# Patient Record
Sex: Female | Born: 1940
Health system: Southern US, Community
[De-identification: ages and names within clinical notes are randomized; demographics above are authoritative.]

## PROBLEM LIST (undated history)

## (undated) DIAGNOSIS — K219 Gastro-esophageal reflux disease without esophagitis: Secondary | ICD-10-CM

## (undated) DIAGNOSIS — B20 Human immunodeficiency virus [HIV] disease: Secondary | ICD-10-CM

## (undated) DIAGNOSIS — M19049 Primary osteoarthritis, unspecified hand: Secondary | ICD-10-CM

## (undated) DIAGNOSIS — C801 Malignant (primary) neoplasm, unspecified: Secondary | ICD-10-CM

## (undated) DIAGNOSIS — D649 Anemia, unspecified: Secondary | ICD-10-CM

## (undated) DIAGNOSIS — K759 Inflammatory liver disease, unspecified: Secondary | ICD-10-CM

## (undated) DIAGNOSIS — I728 Aneurysm of other specified arteries: Secondary | ICD-10-CM

## (undated) DIAGNOSIS — IMO0001 Reserved for inherently not codable concepts without codable children: Secondary | ICD-10-CM

## (undated) DIAGNOSIS — M479 Spondylosis, unspecified: Secondary | ICD-10-CM

## (undated) DIAGNOSIS — K7460 Unspecified cirrhosis of liver: Secondary | ICD-10-CM

## (undated) DIAGNOSIS — Z8669 Personal history of other diseases of the nervous system and sense organs: Secondary | ICD-10-CM

## (undated) DIAGNOSIS — I10 Essential (primary) hypertension: Secondary | ICD-10-CM

## (undated) DIAGNOSIS — T8859XA Other complications of anesthesia, initial encounter: Secondary | ICD-10-CM

## (undated) DIAGNOSIS — C50919 Malignant neoplasm of unspecified site of unspecified female breast: Secondary | ICD-10-CM

## (undated) DIAGNOSIS — E039 Hypothyroidism, unspecified: Secondary | ICD-10-CM

## (undated) DIAGNOSIS — E785 Hyperlipidemia, unspecified: Secondary | ICD-10-CM

## (undated) HISTORY — DX: Unspecified cirrhosis of liver: K74.60

## (undated) HISTORY — PX: BREAST LUMPECTOMY: SHX2

## (undated) HISTORY — DX: Malignant (primary) neoplasm, unspecified: C80.1

## (undated) HISTORY — PX: APPENDECTOMY: SHX54

## (undated) HISTORY — PX: TONSILLECTOMY: SUR1361

## (undated) HISTORY — DX: Hyperlipidemia, unspecified: E78.5

## (undated) HISTORY — DX: Aneurysm of other specified arteries: I72.8

## (undated) HISTORY — DX: Gastro-esophageal reflux disease without esophagitis: K21.9

## (undated) HISTORY — DX: Anemia, unspecified: D64.9

---

## 1973-03-10 HISTORY — PX: TUBAL LIGATION: SHX77

## 2003-12-13 ENCOUNTER — Encounter: Admission: RE | Admit: 2003-12-13 | Discharge: 2003-12-13 | Payer: Self-pay | Admitting: Family Medicine

## 2004-06-19 ENCOUNTER — Ambulatory Visit (HOSPITAL_COMMUNITY): Admission: RE | Admit: 2004-06-19 | Discharge: 2004-06-19 | Payer: Self-pay | Admitting: Family Medicine

## 2004-06-19 IMAGING — CR DG CHEST 2V
2 series · 2 of 2 positions shown · non-contrast
Comparison: No comparison films available.

CLINICAL DATA: Cough, chest pain. 
 2 VIEW CHEST:

[view not recorded (1 of 2)]
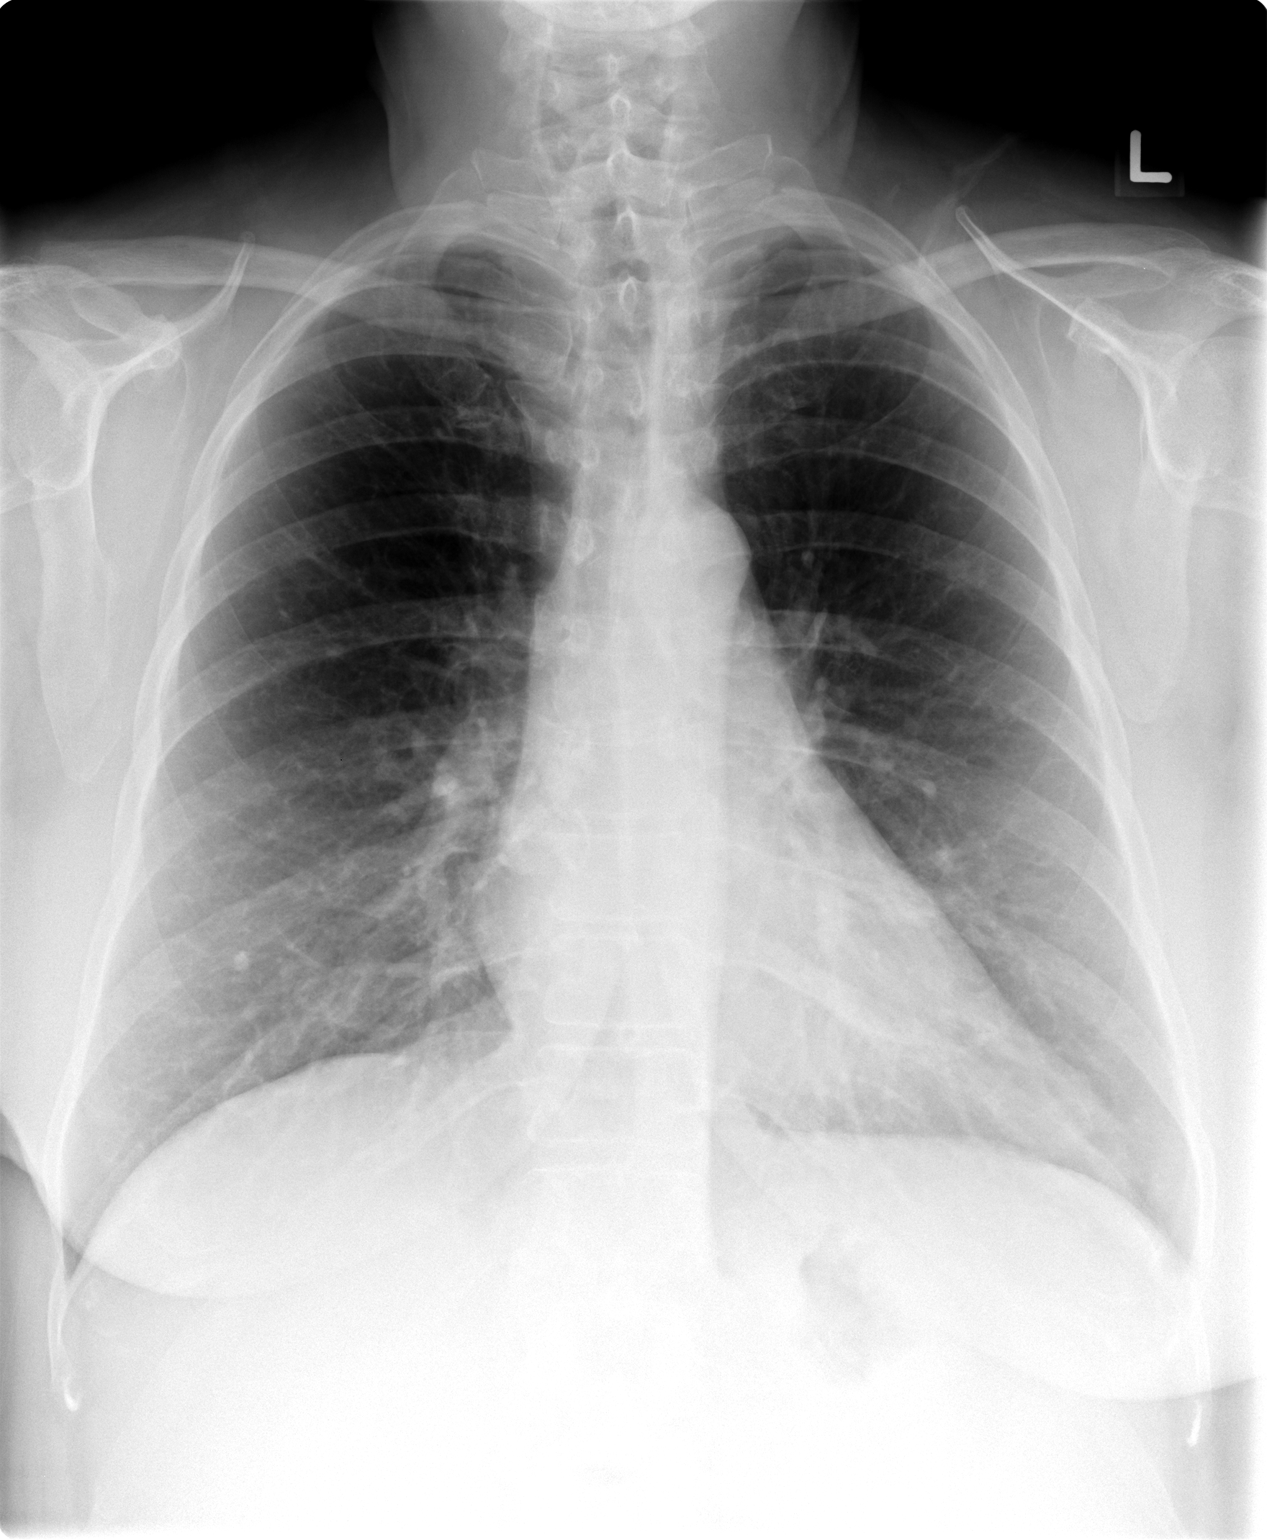

[view not recorded (2 of 2)]
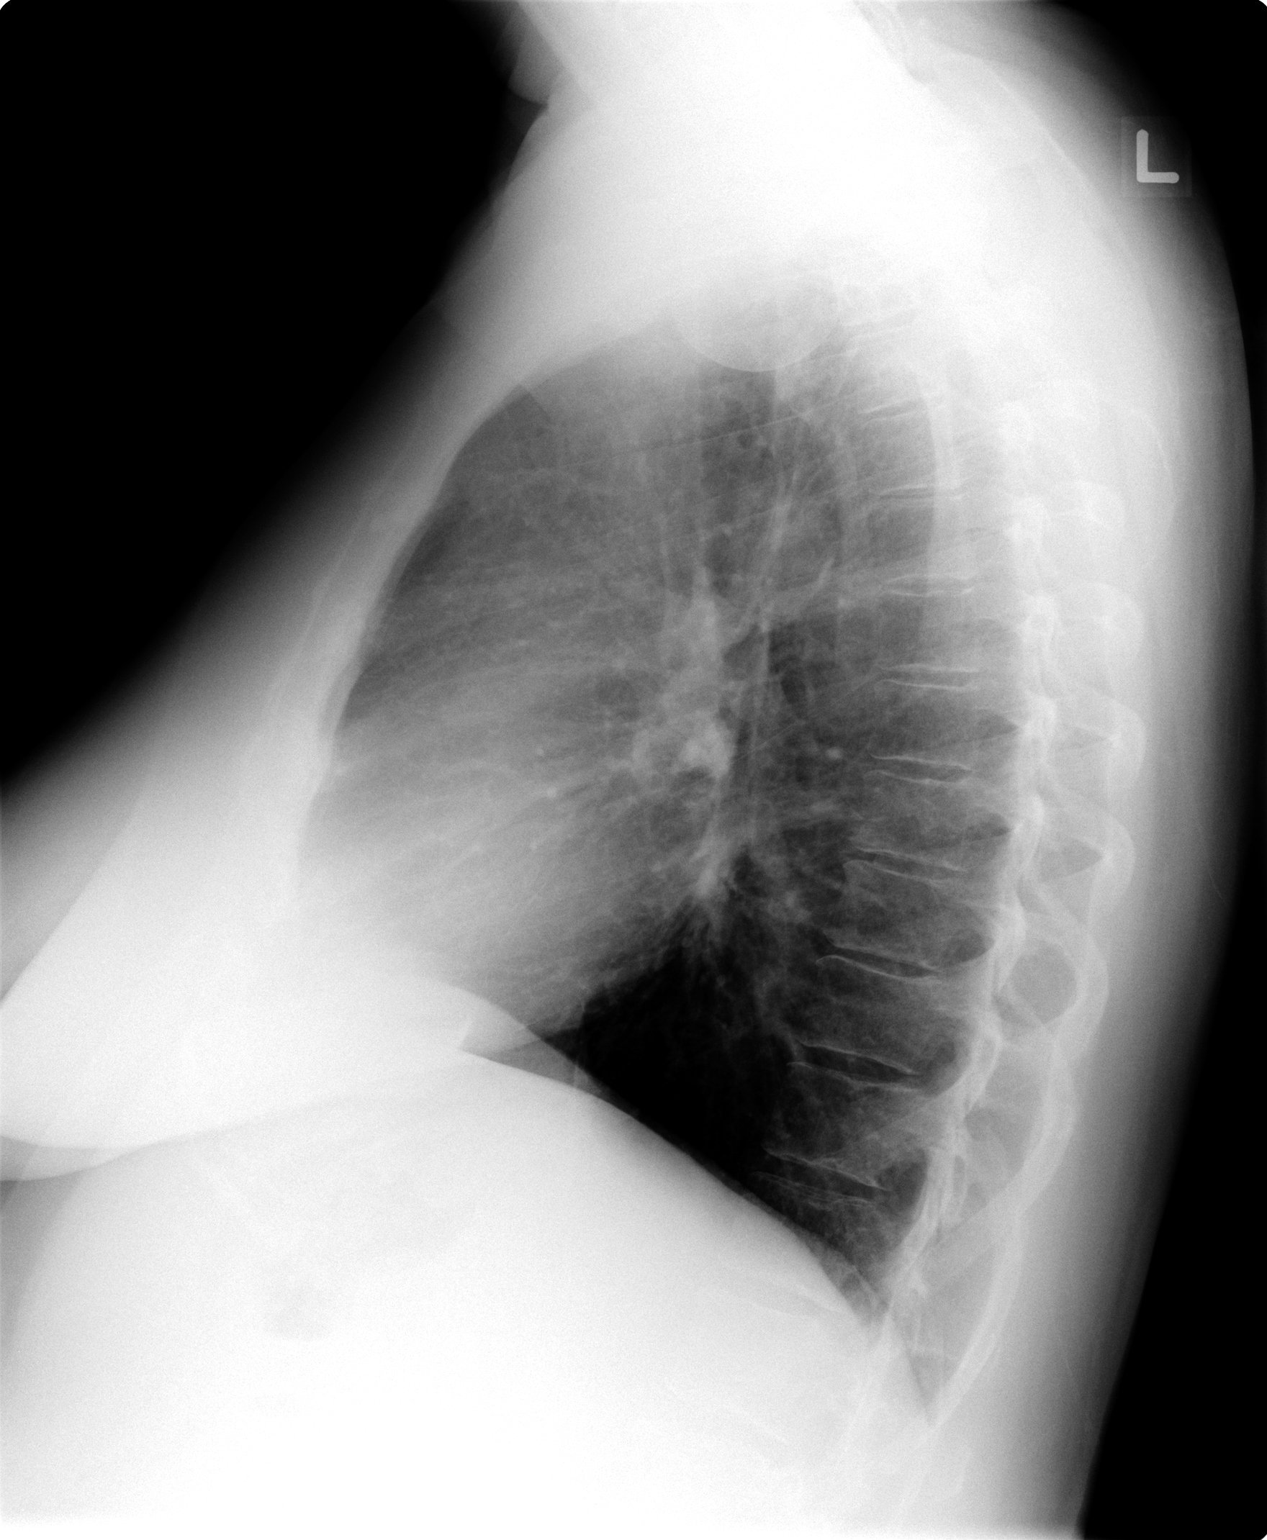

[2 of 2 positions shown; findings below may reference images not displayed]

Mild peribronchial thickening noted.  Cardiomediastinal silhouette is unremarkable.  No focal airspace disease.  Calcified mediastinal / hilar lymph nodes and pulmonary granulomas are noted.  There are a few noncalcified nodules in the mid / upper lungs.  No pleural effusions or pneumothorax.
IMPRESSION: 1. A few small scattered nodules in the mid / upper lungs left greater than right, likely related to prior granulomatous disease but if no old films are available recommend further evaluation with CT. 
 2. Peribronchial thickening and evidence of previous granulomatous disease.

## 2004-06-25 ENCOUNTER — Encounter: Admission: RE | Admit: 2004-06-25 | Discharge: 2004-06-25 | Payer: Self-pay | Admitting: Family Medicine

## 2004-06-25 IMAGING — CT CT CHEST W/ CM
1 series · 15 of 30 positions shown, 19 images · IV contrast (75ML OMNI 300)
Comparison: none

CLINICAL DATA: [REDACTED] chest x-ray [DATE] demonstrates small midupper lung nodules for further assessment, current back pain.  Cough for ten days.  Five pound weight loss.  Fever.  Prior breast cancer.  Right lumpectomy.
CT CHEST WITH CONTRAST:
TECHNIQUE: Multidetector spiral axial images were obtained through the thorax with IV injection 75 cc Omnipaque 300 and comparison made with [REDACTED] chest x-ray [DATE].

[Series 2: chest w/ · axial · 0.62mm/px · z∈[-369,-54]mm · 15 of 69 slices shown, 19 images]
[im 3/69  mediastinal]
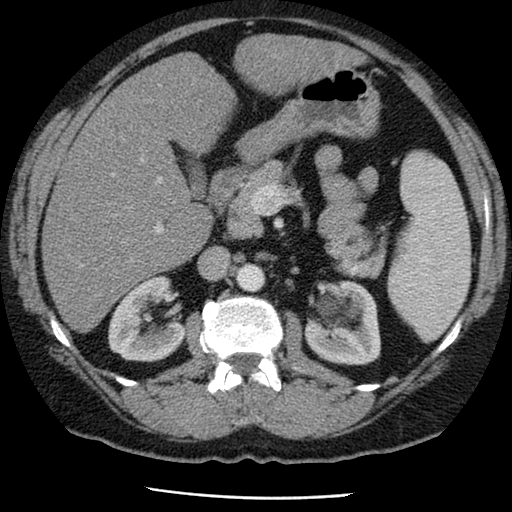
[im 3/69  lung]
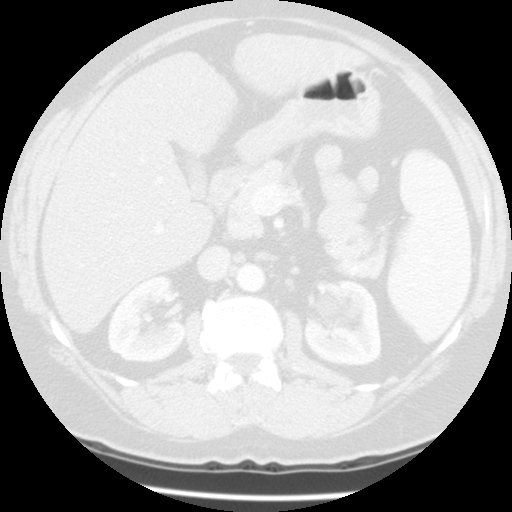
[im 8/69  lung]
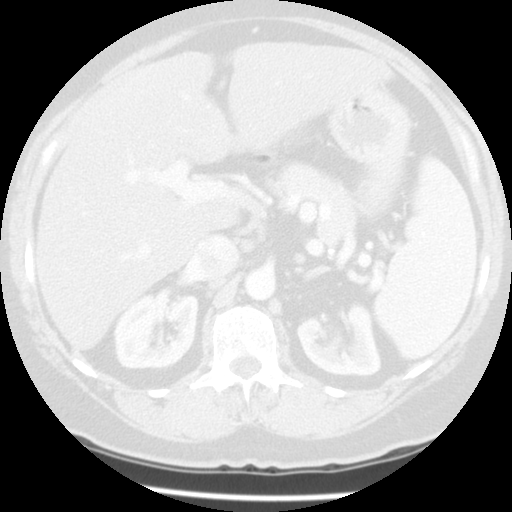
[im 13/69  lung]
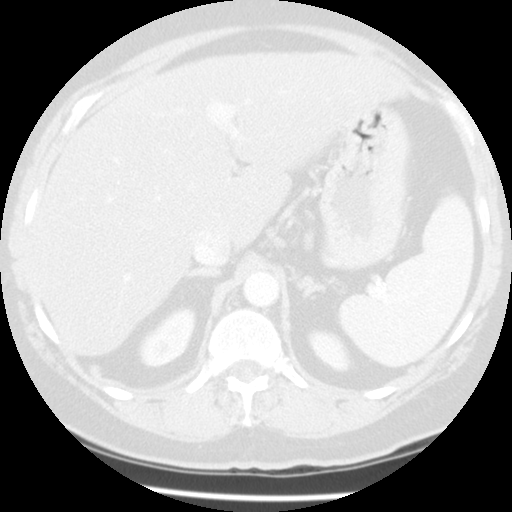
[im 16/69  lung]
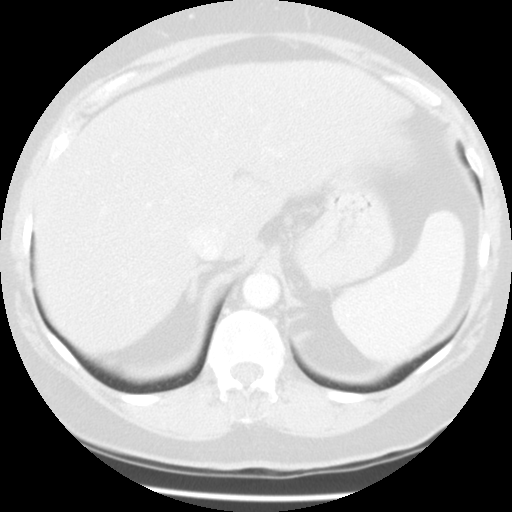
[im 21/69  mediastinal]
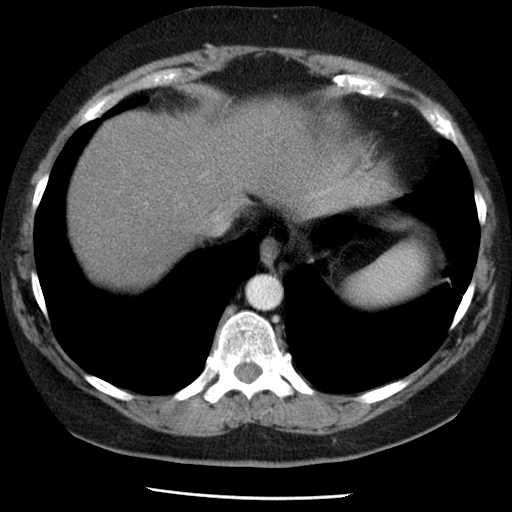
[im 21/69  lung]
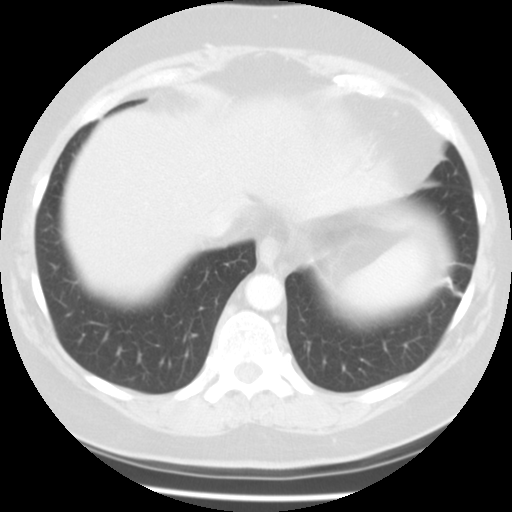
[im 26/69  lung]
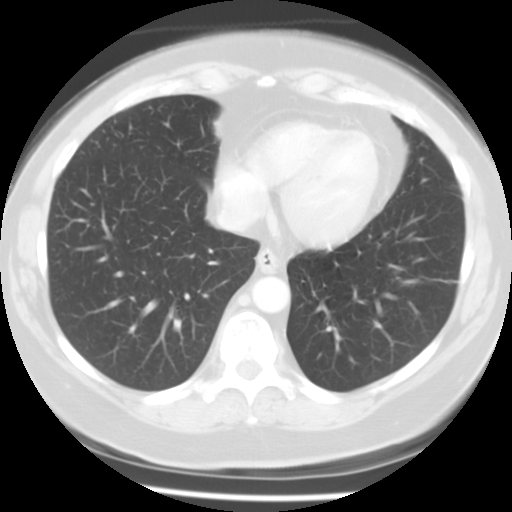
[im 31/69  lung]
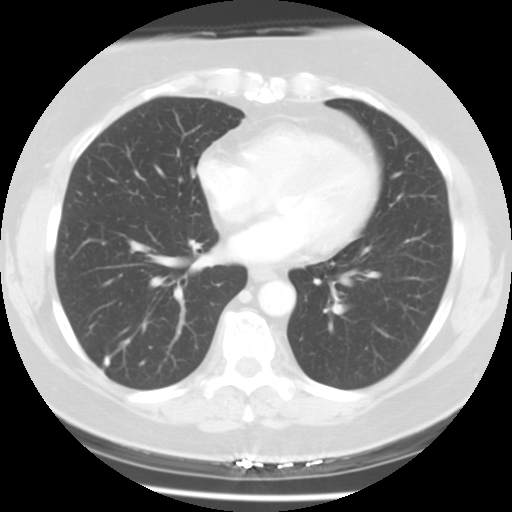
[im 36/69  lung]
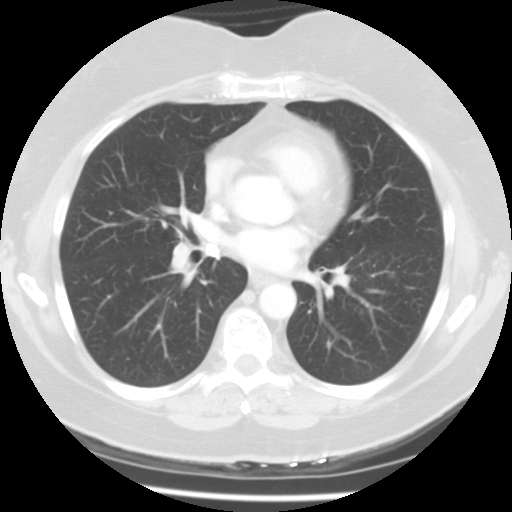
[im 38/69  mediastinal]
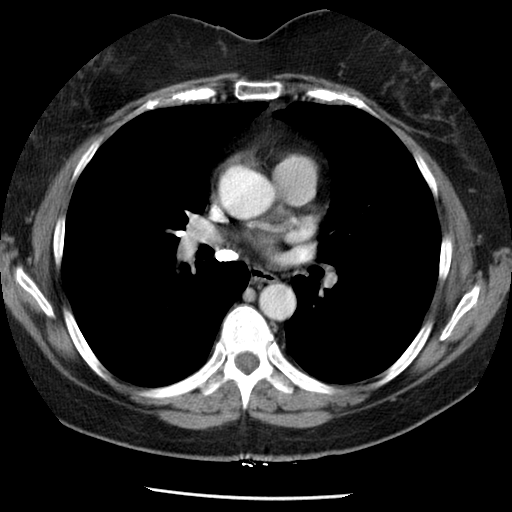
[im 38/69  lung]
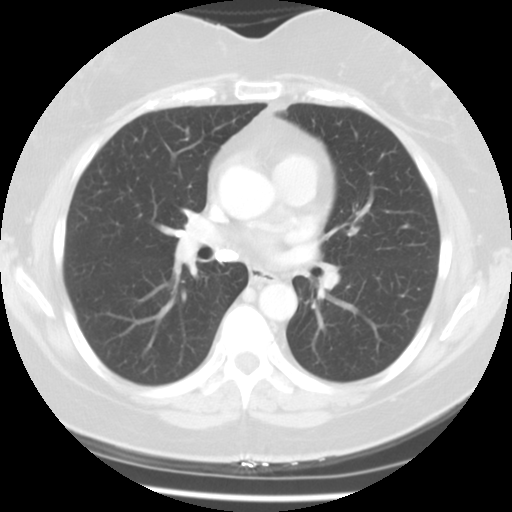
[im 43/69  lung]
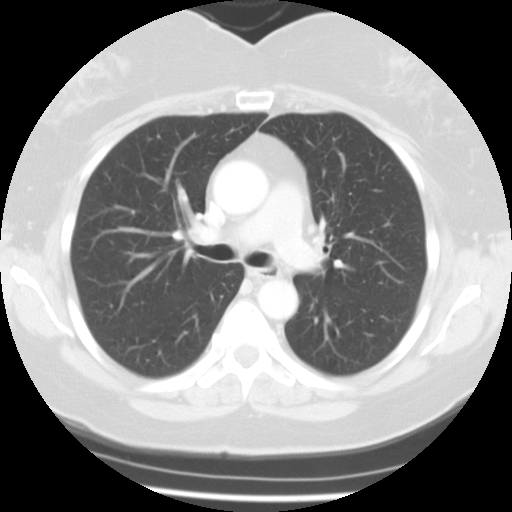
[im 48/69  lung]
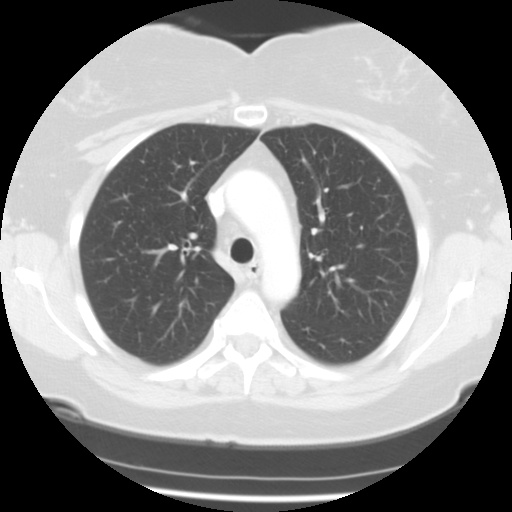
[im 53/69  lung]
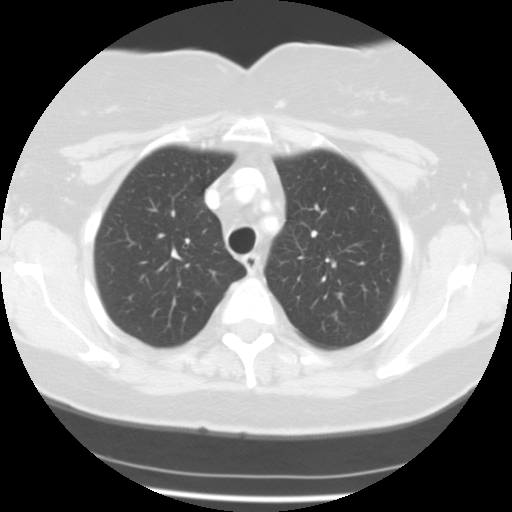
[im 56/69  mediastinal]
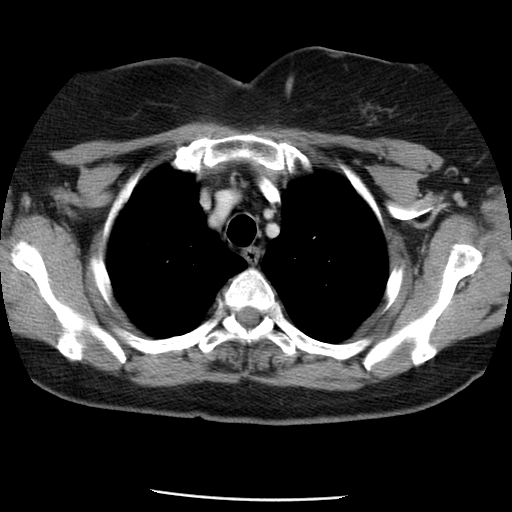
[im 56/69  lung]
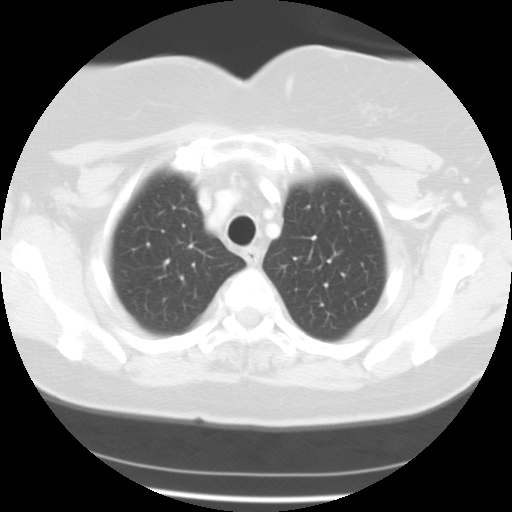
[im 61/69  lung]
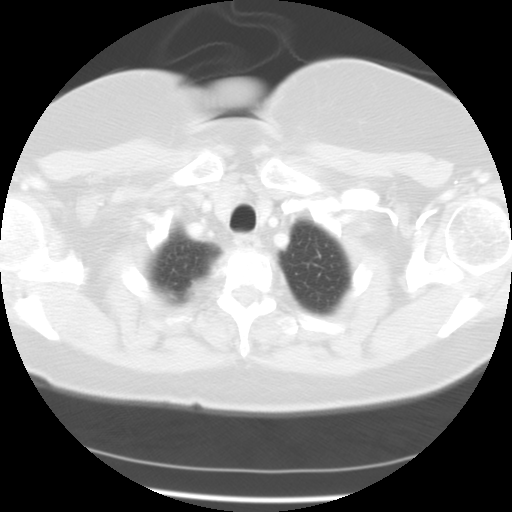
[im 66/69  lung]
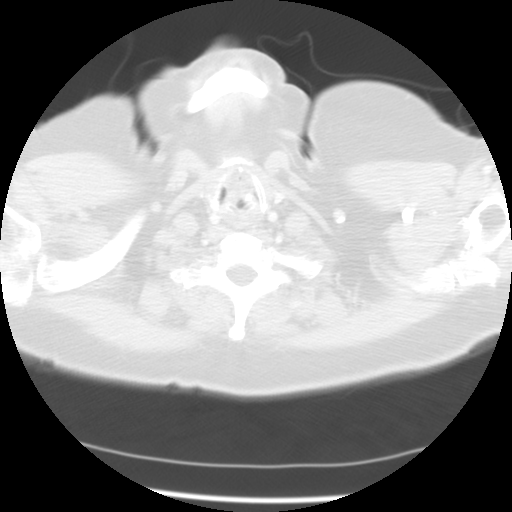

[15 of 30 positions shown; findings below may reference images not displayed]

FINDINGS: In the opacities at the upper lobe on prior CXR, no CT abnormality is seen in this region consistent with previous probable vascular and rib summation shadows.  Old granulomatous calcified right mediastinal and hilar lymph nodes are seen with 6 mm calcified granuloma at the right lung base (image 40).  Slight probable bronchitic changes are seen with slight probable inflammatory fibronodular changes at the superior segment left lower lobe (image 36).  The lungs are otherwise clear.  No mediastinal, hilar, nor axillary mass/adenopathy is seen with no liver, adrenal, nor osseous metastatic disease seen.  Incidental multiple small calcified splenic granulomata and slight diffuse fatty infiltration of the liver is seen.  Probable small 2 cm left renal parapelvic renal cyst is seen.
IMPRESSION: Since [REDACTED] chest x-ray [DATE]:
1.  Upper lobe opacities are ascribed to rib and vascular summation shadows with no pulmonary nodules seen in this region.
2.  Old granulomatous disease. 
3.  Slight diffuse fatty infiltration of the liver and 2 cm parapelvic left renal cyst.
4.  Otherwise no significant abnormality.

## 2005-01-09 ENCOUNTER — Encounter: Admission: RE | Admit: 2005-01-09 | Discharge: 2005-01-09 | Payer: Self-pay | Admitting: Family Medicine

## 2005-11-03 ENCOUNTER — Other Ambulatory Visit: Admission: RE | Admit: 2005-11-03 | Discharge: 2005-11-03 | Payer: Self-pay | Admitting: Family Medicine

## 2007-06-29 ENCOUNTER — Encounter: Admission: RE | Admit: 2007-06-29 | Discharge: 2007-06-29 | Payer: Self-pay | Admitting: Family Medicine

## 2007-06-29 IMAGING — MG MM DIAGNOSTIC BILATERAL
4 series · 4 of 4 positions shown · non-contrast
Comparison: none

DG DIAGNOSTIC BILATERAL
Bilateral CC and MLO view(s) were taken.

DIGITAL BILATERAL DIAGNOSTIC MAMMOGRAM WITH CAD:
CLINICAL DATA: Patient underwent lumpectomy and radiation therapy for right breast cancer in  [OQ].

[R CC]
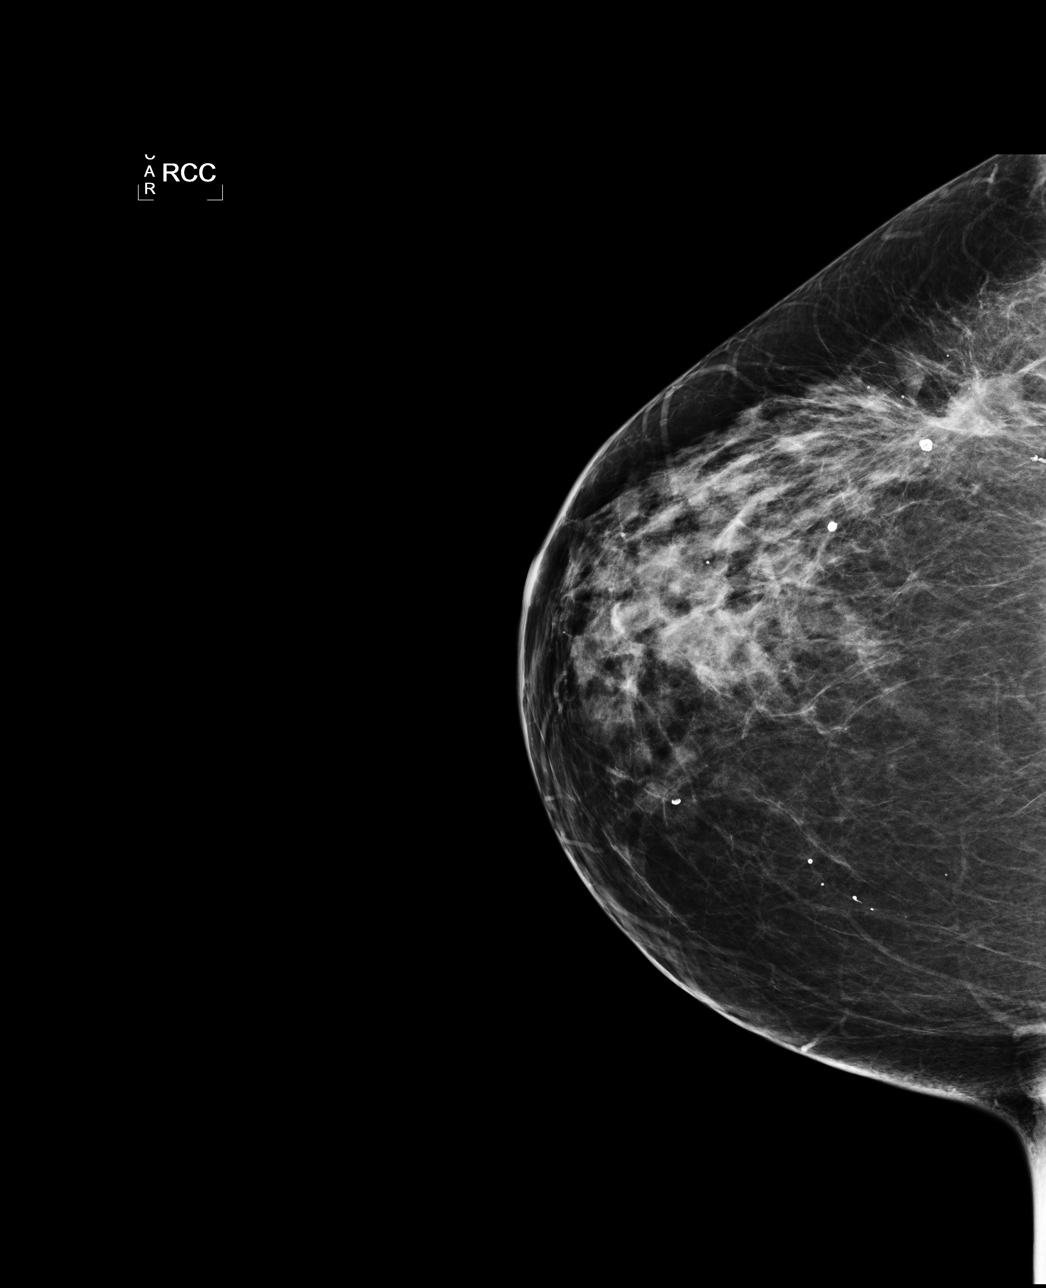

[L CC]
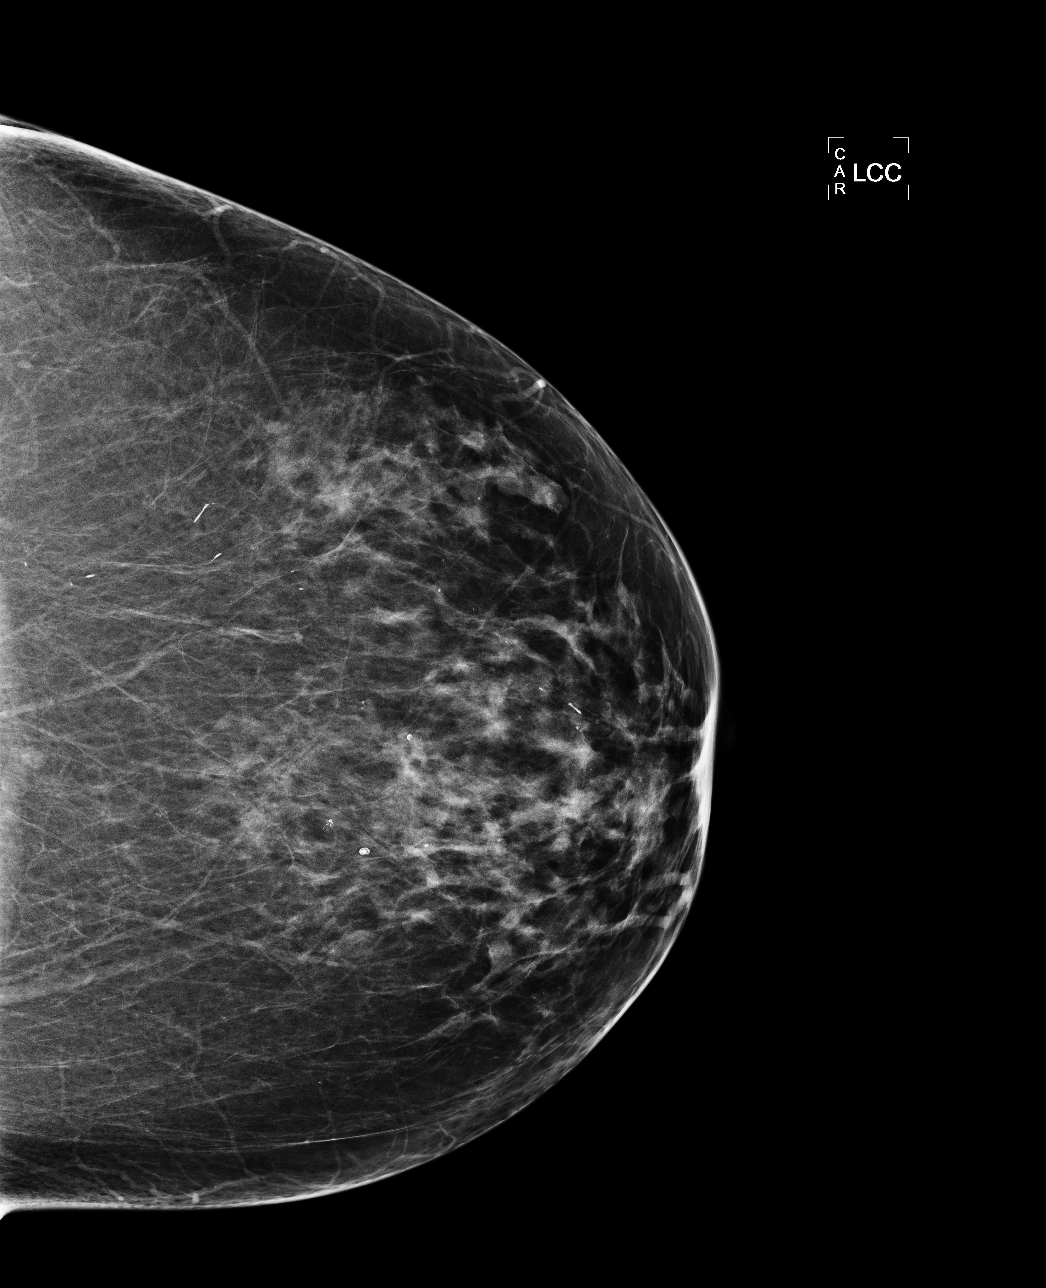

[L MLO]
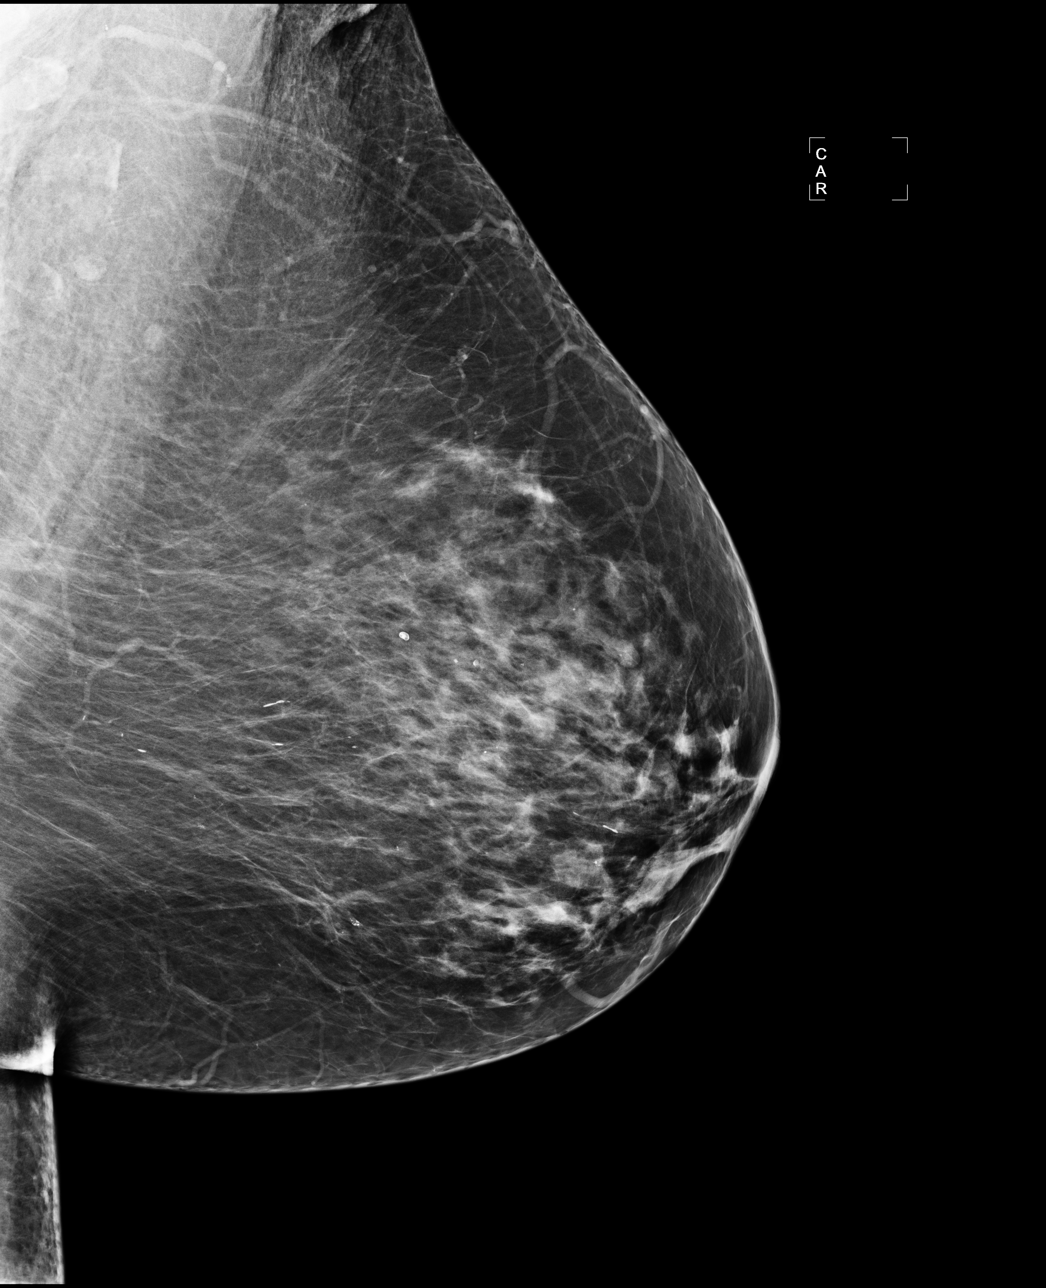

[R MLO]
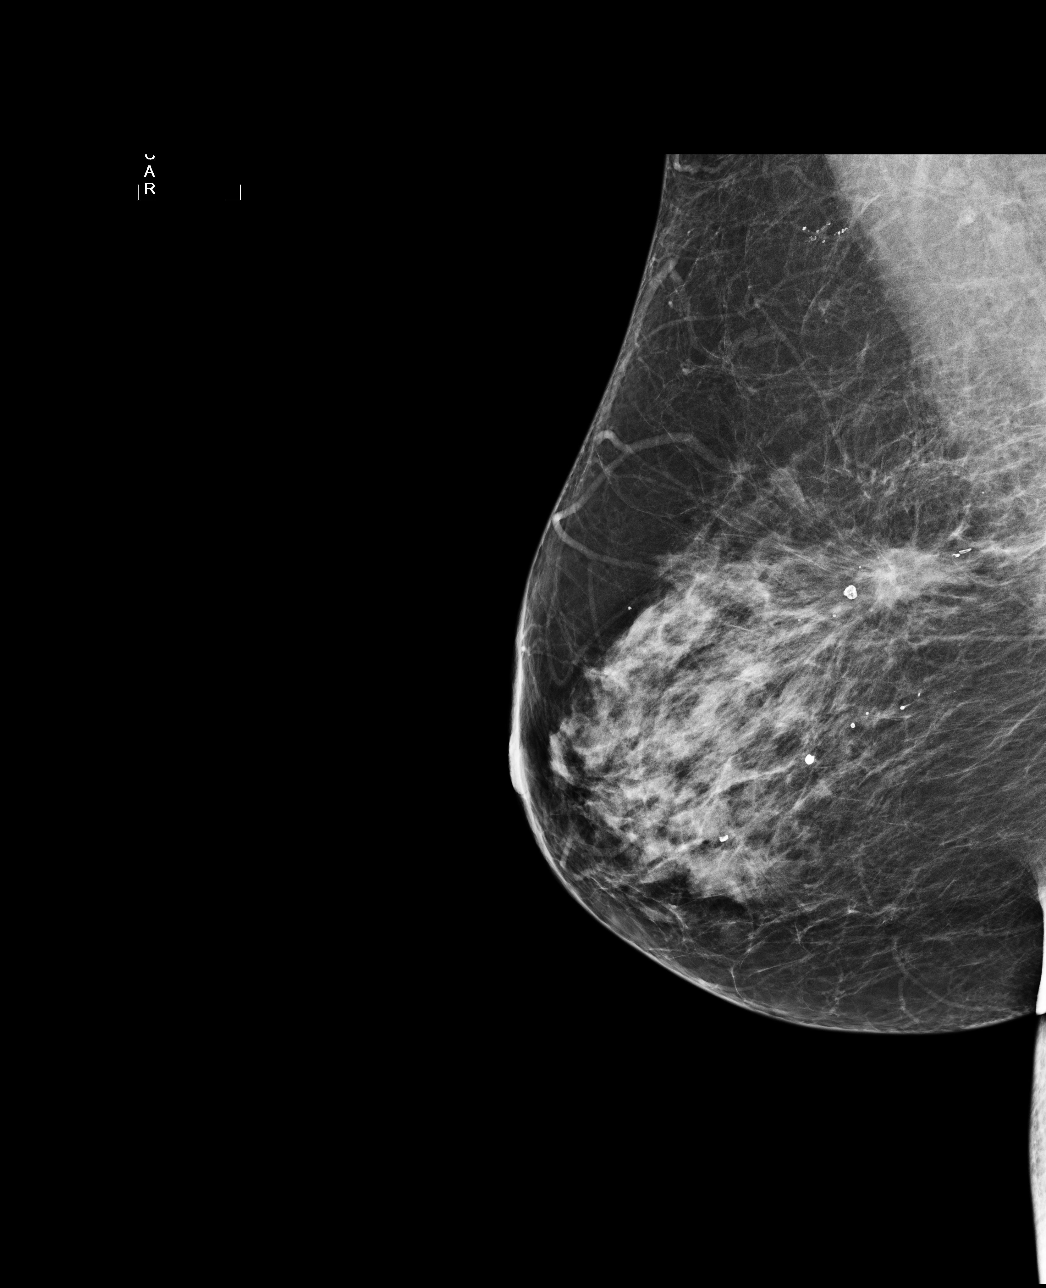

[4 of 4 positions shown; findings below may reference images not displayed]

Comparison is made to prior studies dated [DATE] and [DATE].  There are scattered fibroglandular 
densities.  Lumpectomy changes are noted on the right.  There are scattered calcifications that 
have a benign appearance.  No mass, non-surgical architectural distortion or calcification to 
suggest malignancy is identified.
IMPRESSION: No mammographic evidence of malignancy.  Yearly screening mammography is now suggested.

ASSESSMENT: Benign - BI-RADS 2

Screening mammogram of both breasts in 1 year.
ANALYZED BY COMPUTER AIDED DETECTION. , THIS PROCEDURE WAS A DIGITAL MAMMOGRAM

## 2008-05-17 ENCOUNTER — Encounter: Admission: RE | Admit: 2008-05-17 | Discharge: 2008-05-17 | Payer: Self-pay | Admitting: Family Medicine

## 2008-05-17 IMAGING — CT CT ABDOMEN W/ CM
2 of 5 series · 17 of 46 positions shown, 19 images · IV contrast (CONTRAST)
Comparison: None

CT ABDOMEN

CLINICAL DATA: Questioned aneurysm of the splenic artery, history
right breast carcinoma with left back [REDACTED] and radiation, history
diabetes

CT ABDOMEN AND PELVIS WITH CONTRAST
TECHNIQUE: Multidetector CT imaging of the abdomen and pelvis was
performed using the standard protocol following bolus
administration of intravenous contrast.
Contrast: 100 ml Optiray 350

[Series 2: portal · axial · portal-venous · 0.73mm/px · z∈[+628,+1012]mm · 14 of 87 slices shown, 16 images]
[im 5/87  soft-tissue]
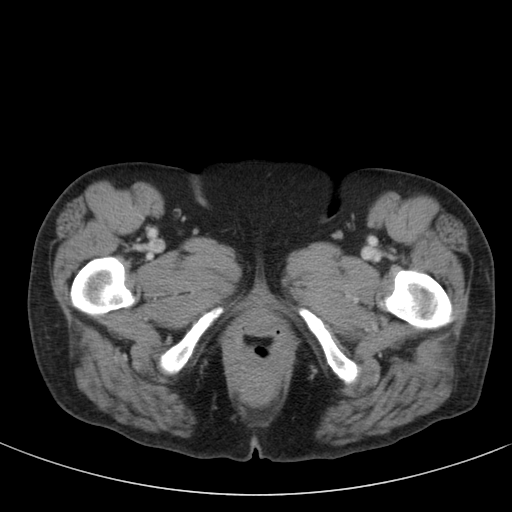
[im 5/87  bone]
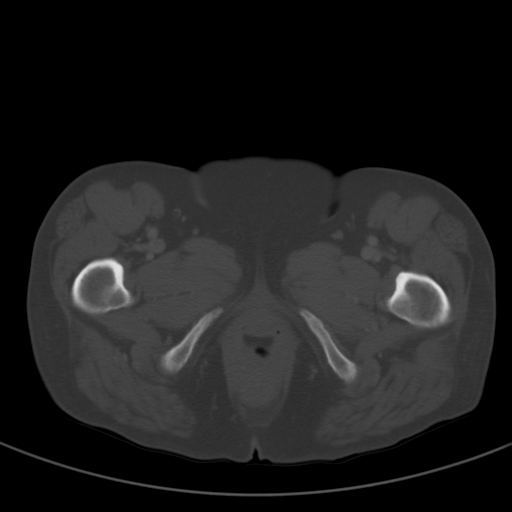
[im 10/87  soft-tissue]
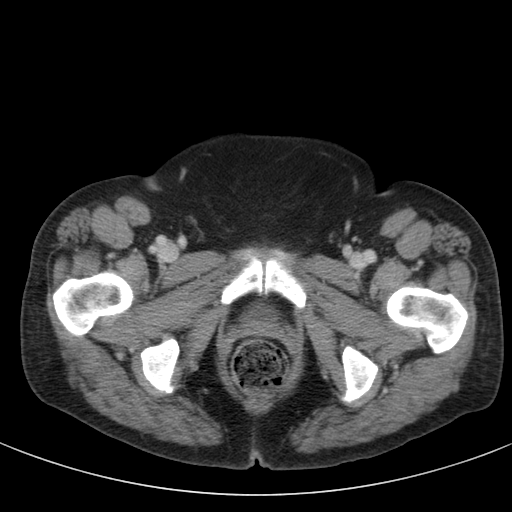
[im 20/87  soft-tissue]
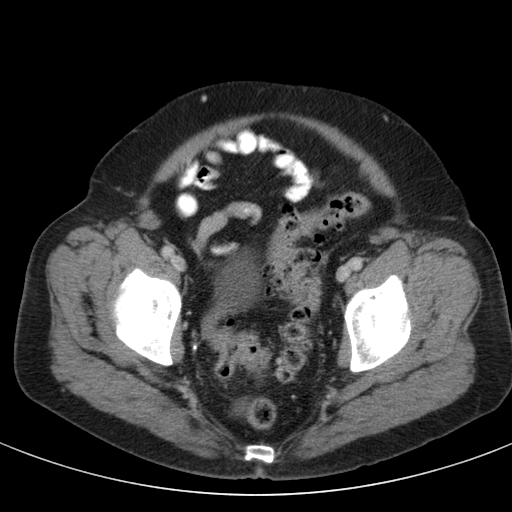
[im 24/87  soft-tissue]
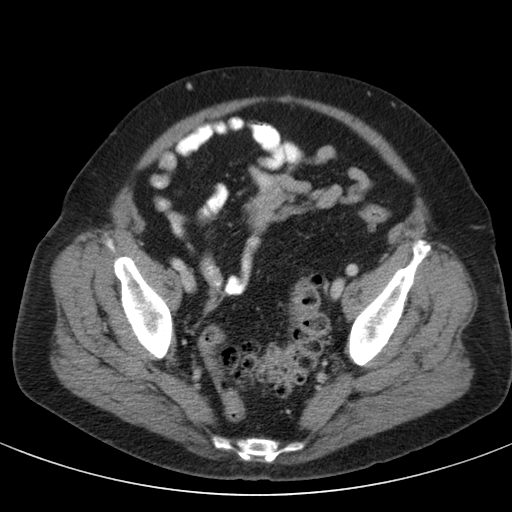
[im 29/87  soft-tissue]
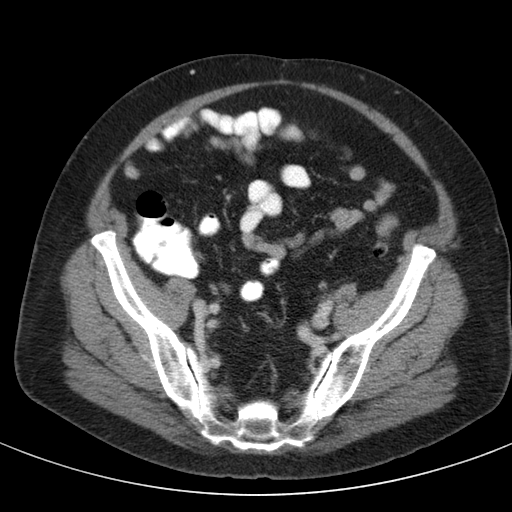
[im 34/87  soft-tissue]
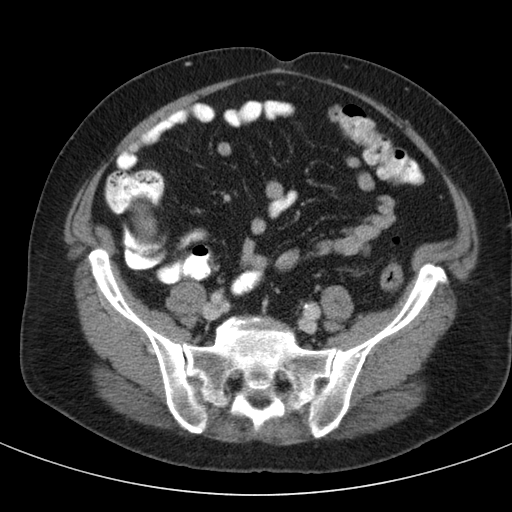
[im 39/87  soft-tissue]
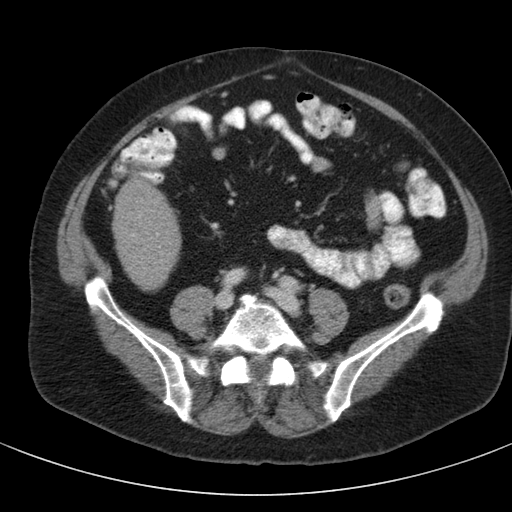
[im 48/87  soft-tissue]
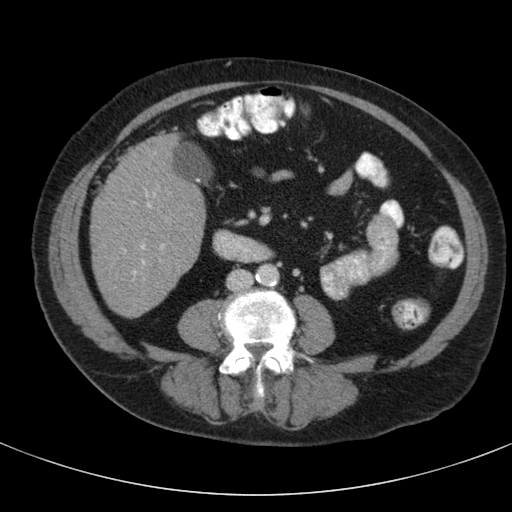
[im 53/87  soft-tissue]
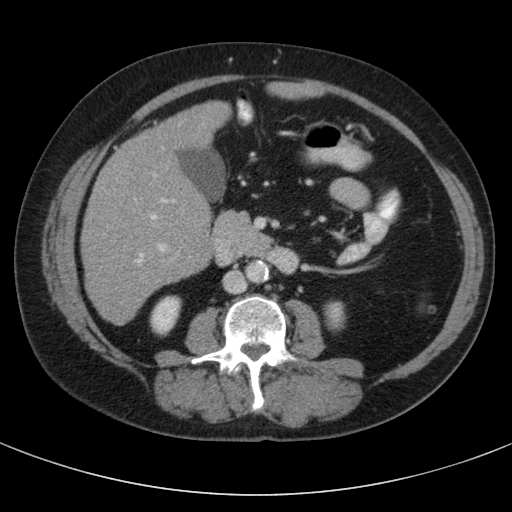
[im 53/87  bone]
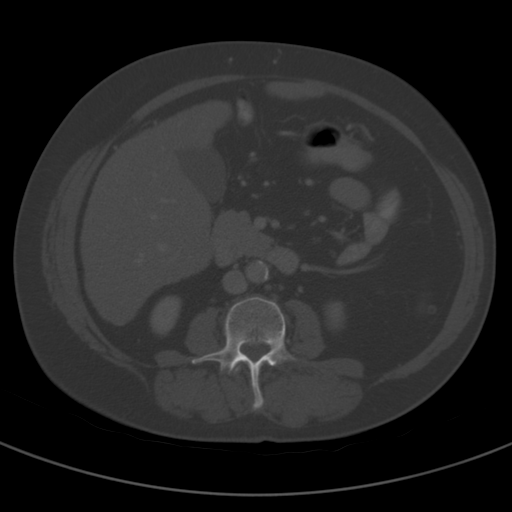
[im 58/87  soft-tissue]
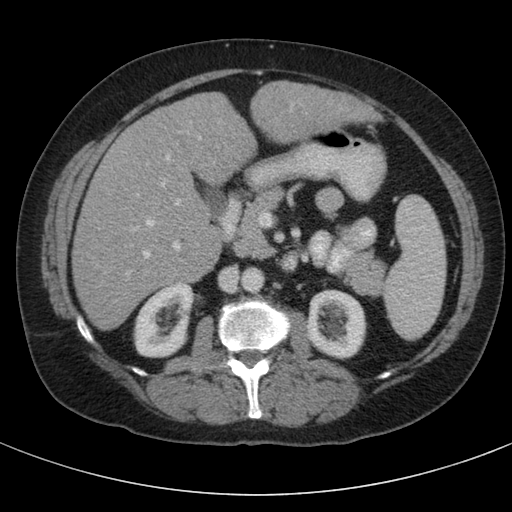
[im 63/87  soft-tissue]
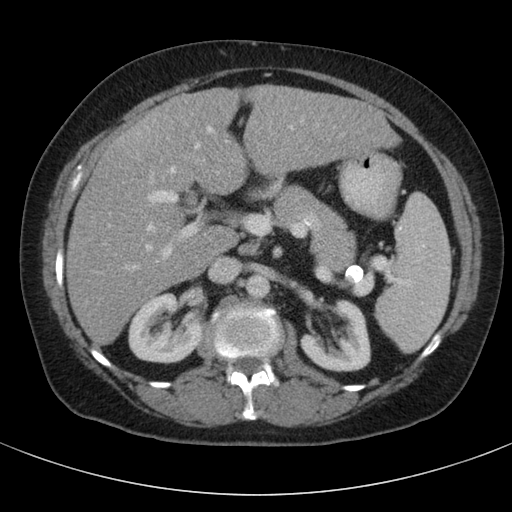
[im 67/87  soft-tissue]
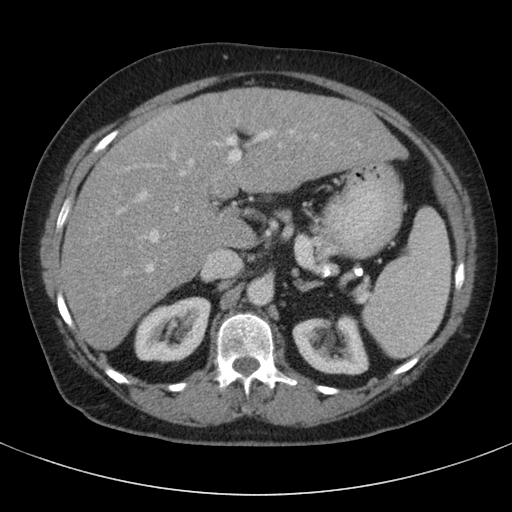
[im 77/87  soft-tissue]
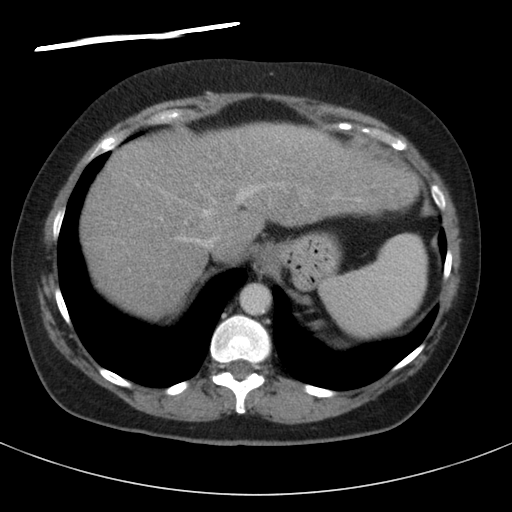
[im 82/87  soft-tissue]
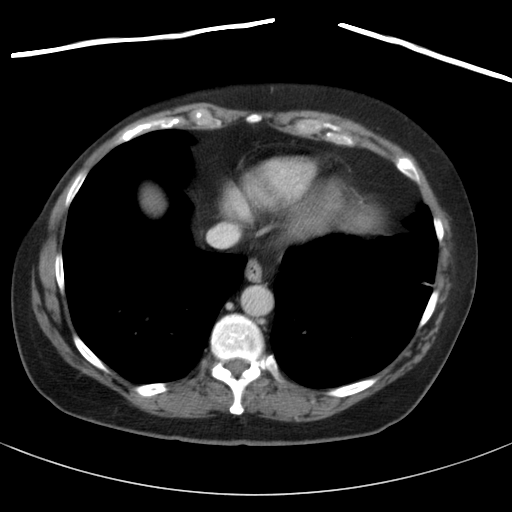

[coronals · coronal · 0.84mm/px · 3 of 121 slices shown]
[im 41/121  soft-tissue]
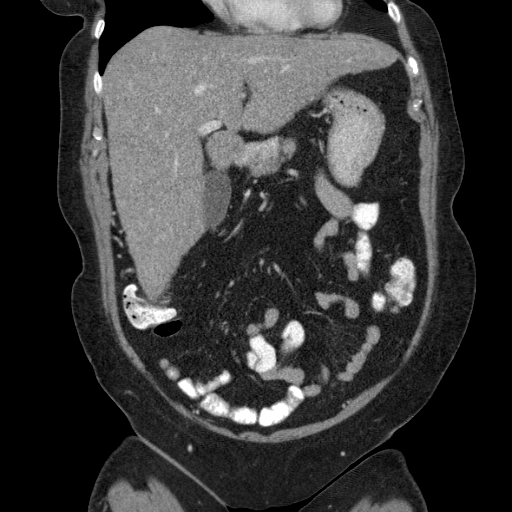
[im 54/121  soft-tissue]
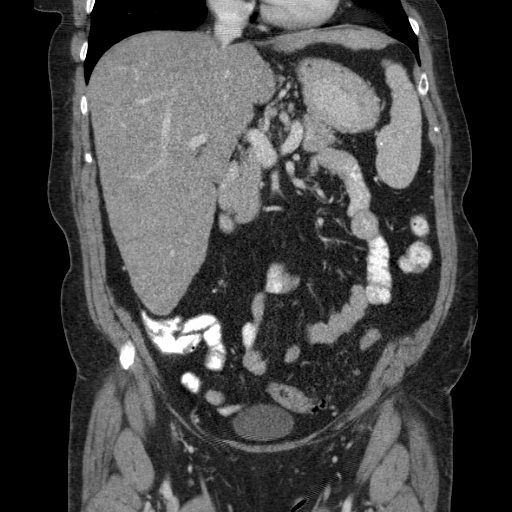
[im 67/121  soft-tissue]
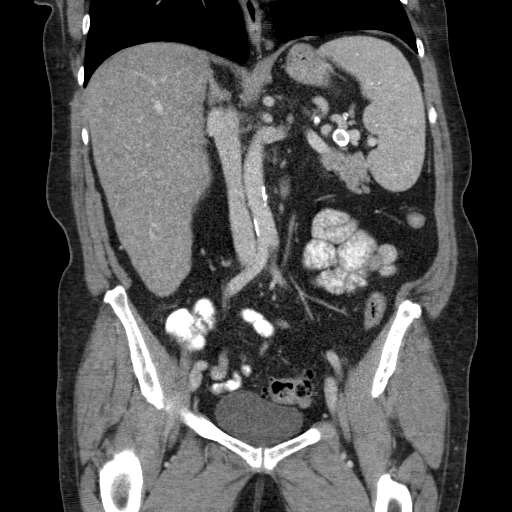

[17 of 46 positions shown; findings below may reference images not displayed]

FINDINGS: A calcified granuloma is noted in the right lower lobe.
Otherwise the lung bases are clear.  Enhancement of the liver is
somewhat inhomogeneous of the liver is rather low in attenuation
suggesting fatty infiltration.  Small partially calcified gallstone
layers dependently within the gallbladder.  The pancreas is normal
in size and the pancreatic duct is not dilated.  The adrenal glands
and spleen appear normal with a few small calcified splenic
granulomas noted.  There is some calcification of the splenic
artery.  There is a heavily calcified small splenic artery aneurysm
of 13 mm in diameter.  The kidneys enhance and on delayed images
the pelvocaliceal systems appear normal.  Mild atheromatous changes
noted in the abdominal aorta.  No adenopathy is seen.
IMPRESSION: 1.  Densely calcified small splenic artery aneurysm of 13 mm in
diameter.
2.  Fatty infiltration of the liver.
3.  Single small calcified gallstones.
4.  Small calcified granuloma in the right lower lobe with small
calcified granulomas within the spleen consistent with prior
granulomatous disease.

CT PELVIS
FINDINGS: The urinary bladder is unremarkable.  The patient has
previously undergone hysterectomy.  Multiple rectosigmoid colonic
diverticula are seen.  The appendix and the terminal ileum appear
normal.
IMPRESSION: Multiple rectosigmoid colonic diverticula.  The appendix and
terminal ileum appear normal.

## 2008-06-07 ENCOUNTER — Ambulatory Visit: Payer: Self-pay | Admitting: Vascular Surgery

## 2008-09-29 ENCOUNTER — Encounter: Admission: RE | Admit: 2008-09-29 | Discharge: 2008-09-29 | Payer: Self-pay | Admitting: Family Medicine

## 2008-09-29 IMAGING — MG MM SCREEN MAMMOGRAM BILATERAL
4 series · 4 of 4 positions shown · non-contrast
Comparison: none

DG SCREEN MAMMOGRAM BILATERAL
Bilateral CC and MLO view(s) were taken.

DIGITAL SCREENING MAMMOGRAM WITH CAD:
The breast tissue is heterogeneously dense.  Postoperative changes on the right.  No masses or 
malignant type calcifications are identified.  Compared with prior studies.
Images were processed with CAD.

[R CC]
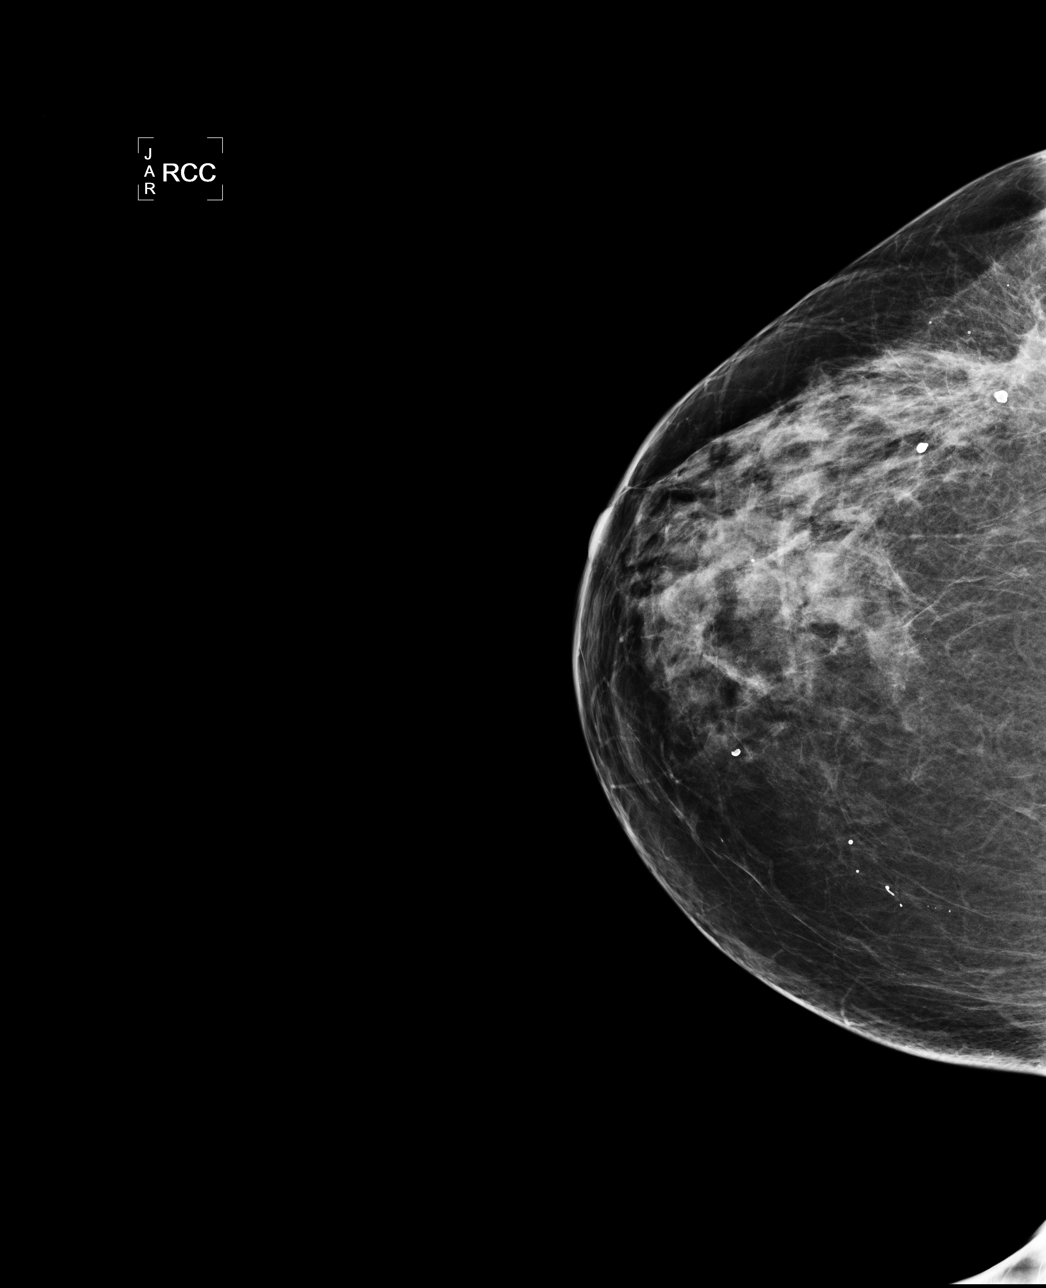

[L CC]
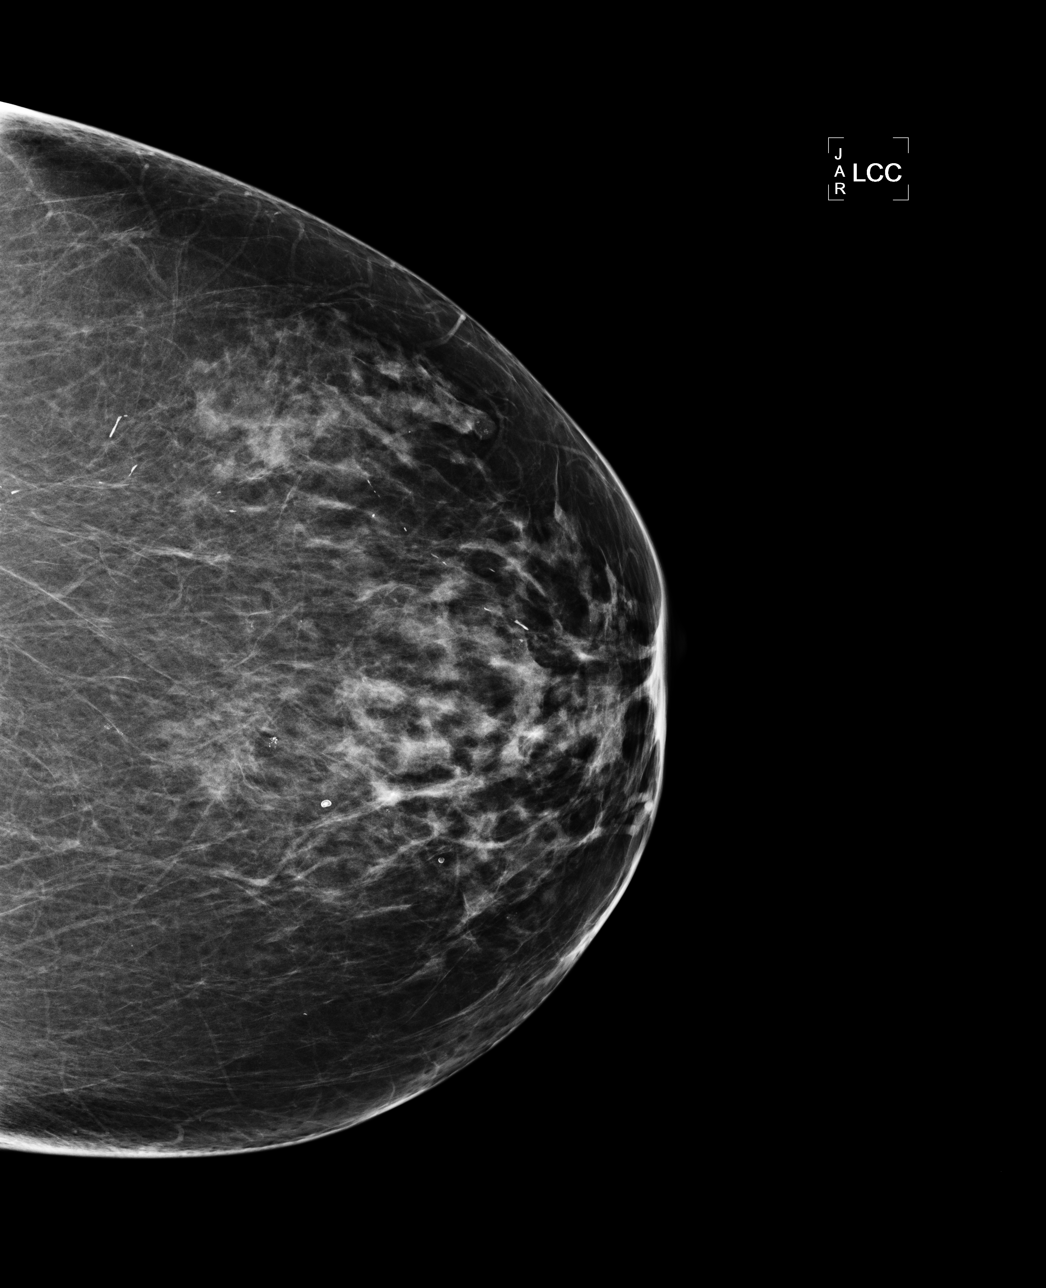

[L MLO]
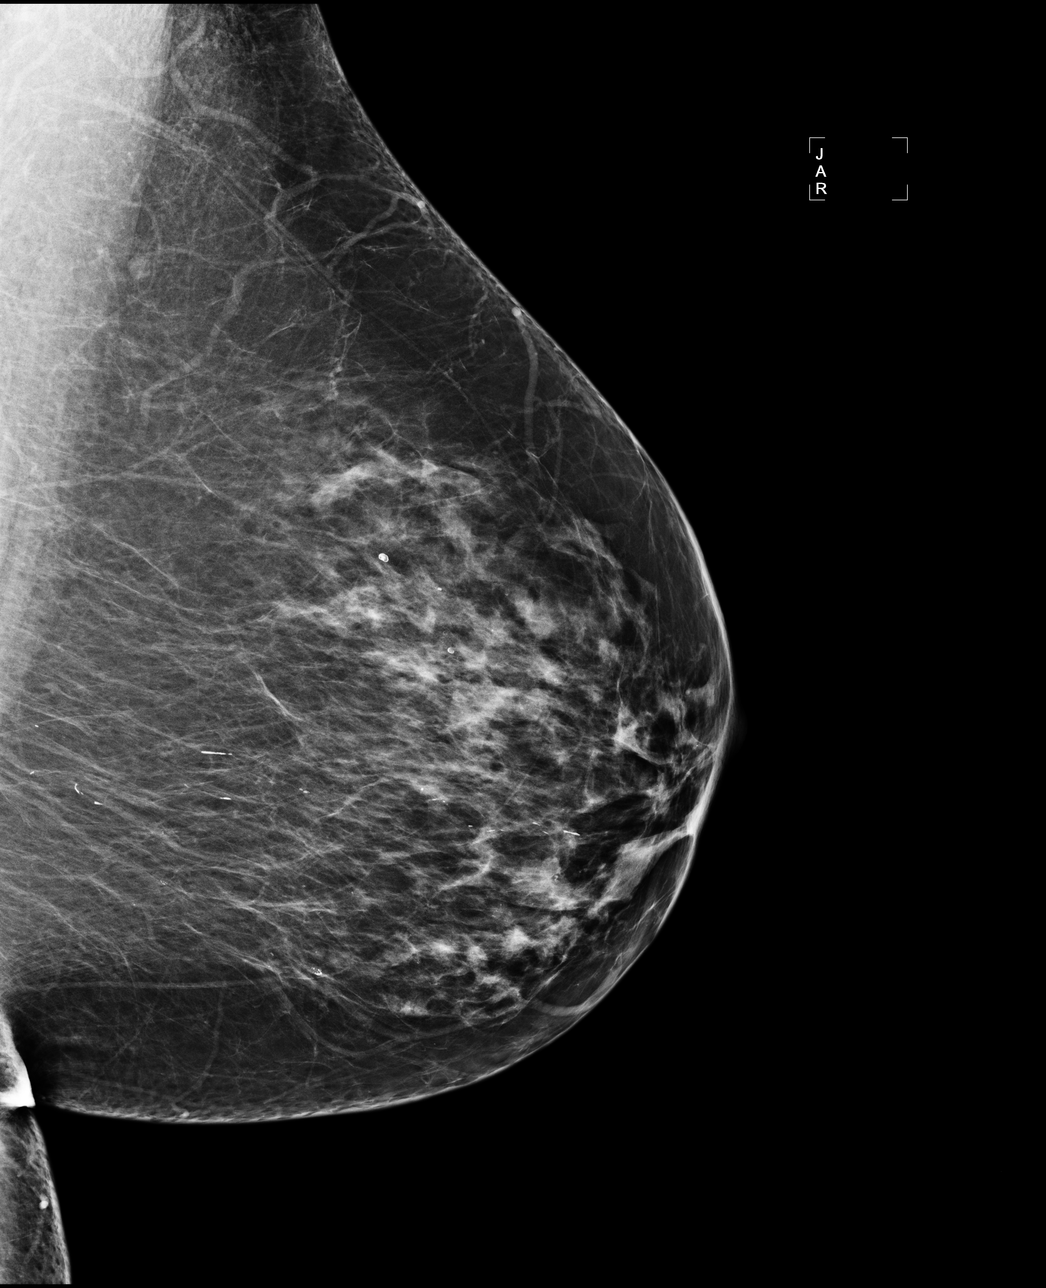

[R MLO]
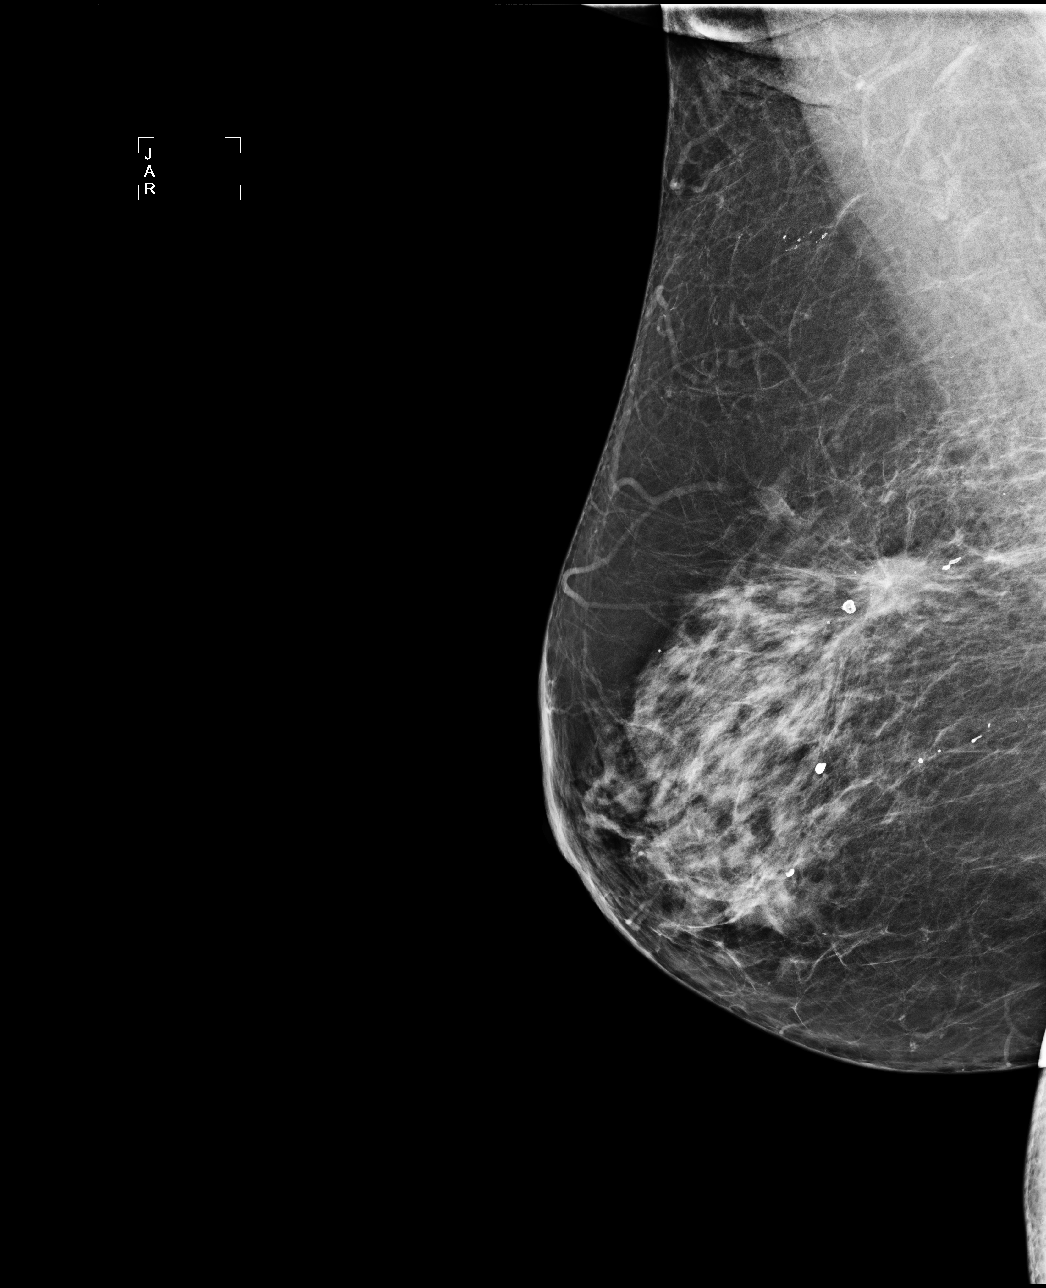

[4 of 4 positions shown; findings below may reference images not displayed]

IMPRESSION: No specific mammographic evidence of malignancy.  Next screening mammogram is recommended in one 
year.

A result letter of this screening mammogram will be mailed directly to the patient.

ASSESSMENT: Negative - BI-RADS 1

Screening mammogram in 1 year.
, THIS PROCEDURE WAS A DIGITAL MAMMOGRAM.

## 2009-06-06 ENCOUNTER — Encounter: Admission: RE | Admit: 2009-06-06 | Discharge: 2009-06-06 | Payer: Self-pay | Admitting: Vascular Surgery

## 2009-06-06 ENCOUNTER — Ambulatory Visit: Payer: Self-pay | Admitting: Vascular Surgery

## 2009-06-06 IMAGING — CT CT ANGIO ABDOMEN
1 of 7 series · 12 of 46 positions shown, 18 images · IV contrast ([ID] OMNI 300)
Comparison: [DATE], [DATE]

CTA ABDOMEN

CLINICAL DATA: Splenic artery aneurysm

CT ANGIOGRAPHY ABDOMEN AND PELVIS
TECHNIQUE: Multidetector CT imaging of the abdomen and pelvis was
performed using the standard protocol during bolus administration
of intravenous contrast.  Multiplanar reconstructed images
including MIPs were obtained and reviewed to evaluate the vascular
anatomy.
Contrast:  100 ml [3K]

[Series 5: angio · axial · 0.76mm/px · z∈[-437,-69]mm · 12 of 173 slices shown, 18 images]
[im 13/173  soft-tissue]
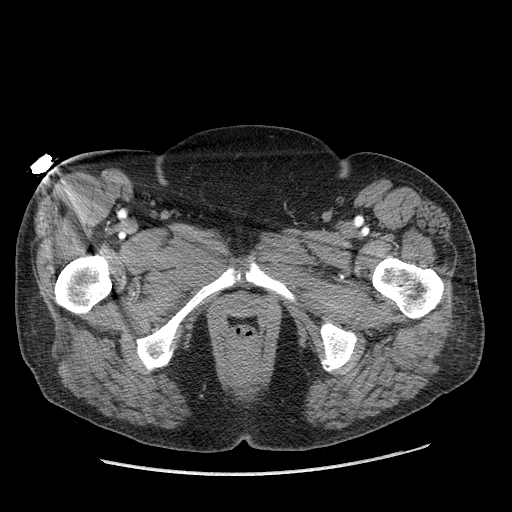
[im 13/173  bone]
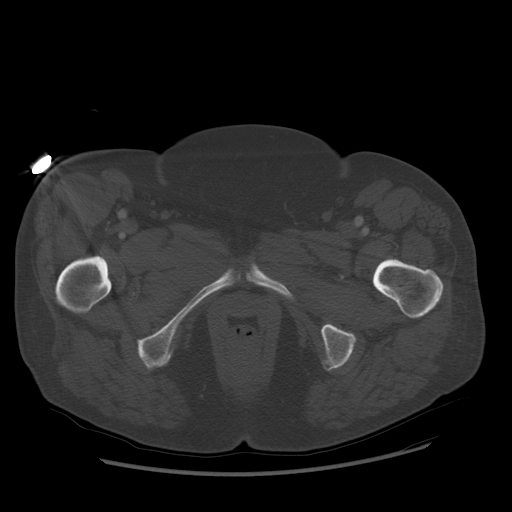
[im 25/173  soft-tissue]
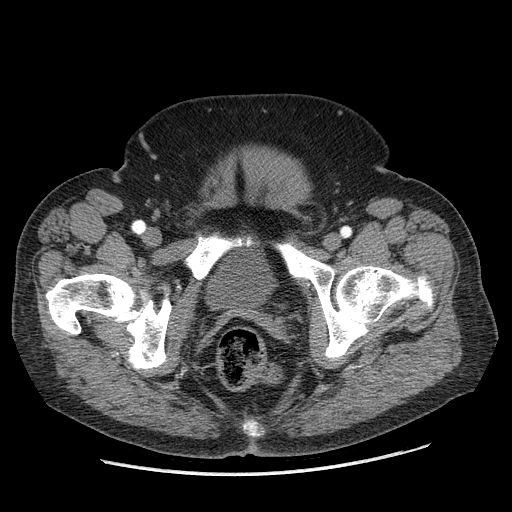
[im 37/173  soft-tissue]
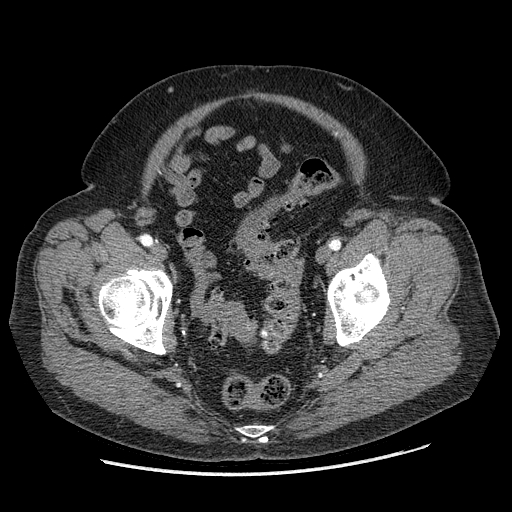
[im 50/173  soft-tissue]
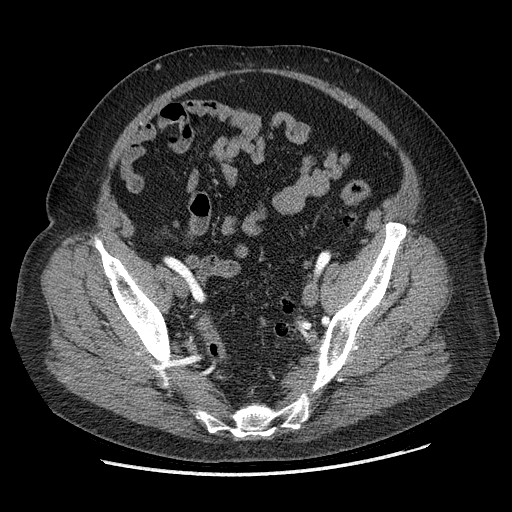
[im 62/173  soft-tissue]
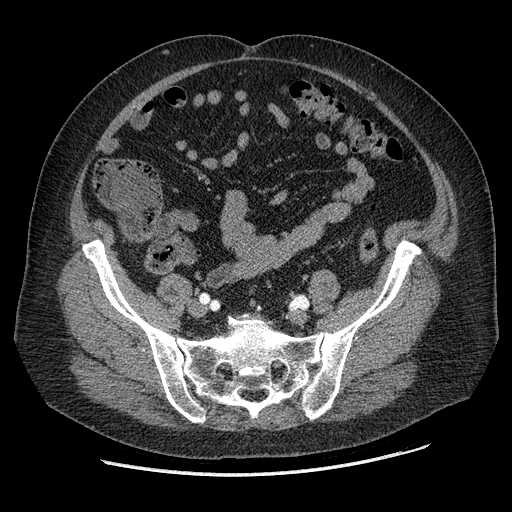
[im 74/173  soft-tissue]
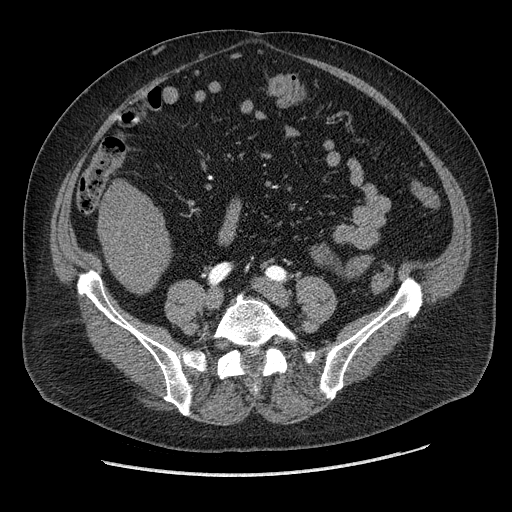
[im 99/173  soft-tissue]
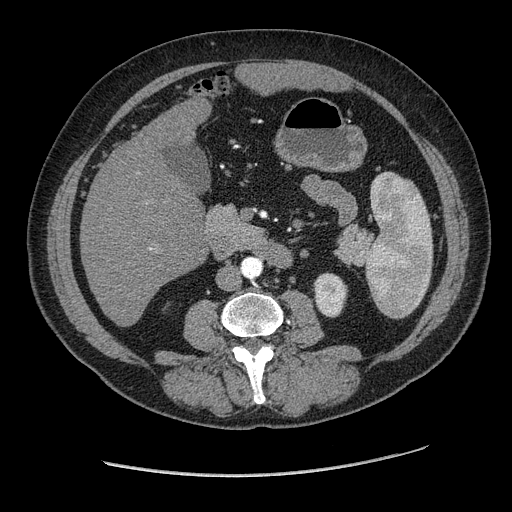
[im 111/173  soft-tissue]
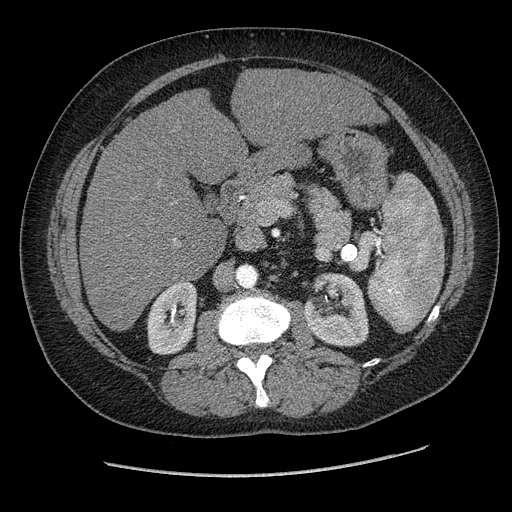
[im 123/173  soft-tissue]
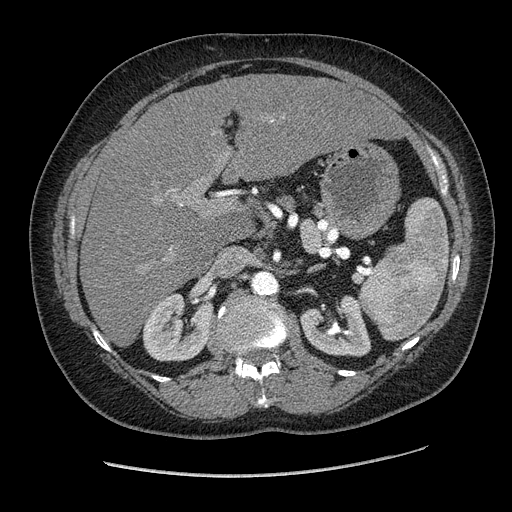
[im 123/173  lung]
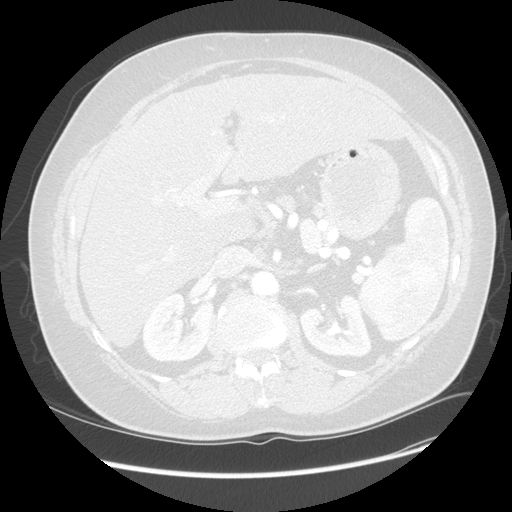
[im 123/173  bone]
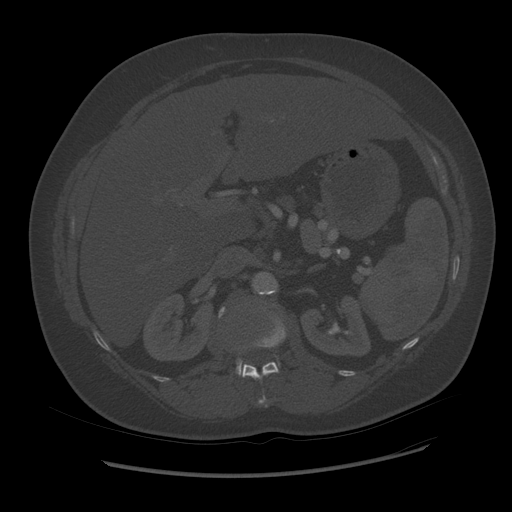
[im 136/173  soft-tissue]
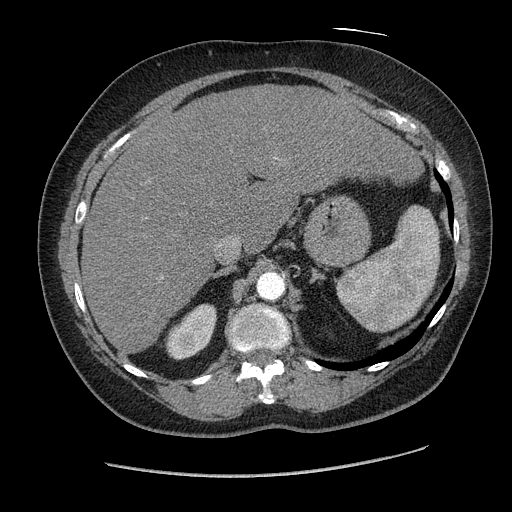
[im 136/173  lung]
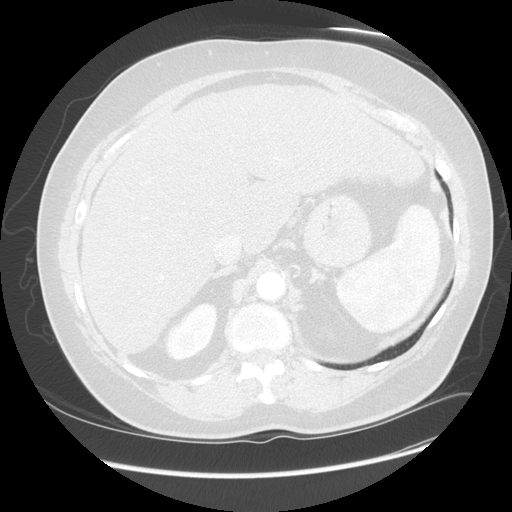
[im 148/173  soft-tissue]
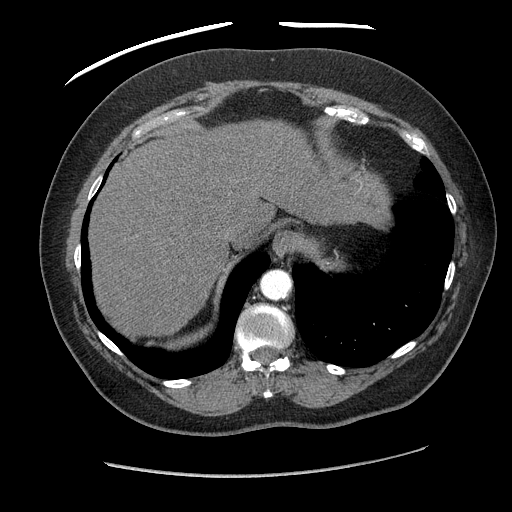
[im 148/173  lung]
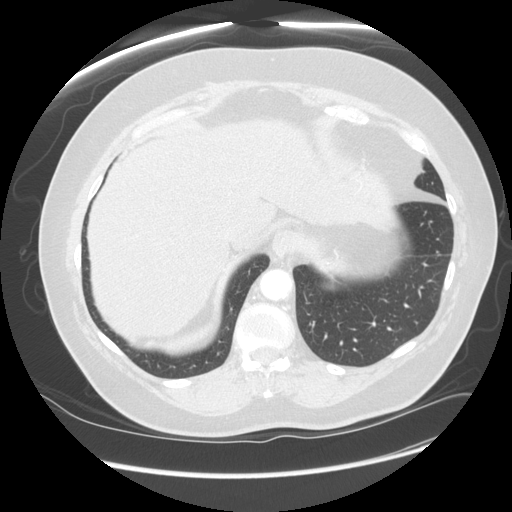
[im 160/173  soft-tissue]
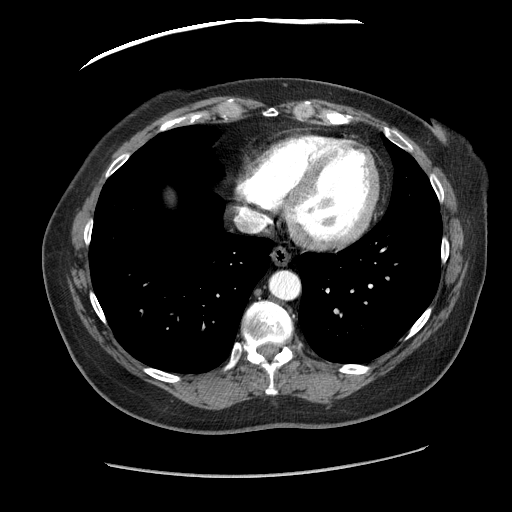
[im 160/173  lung]
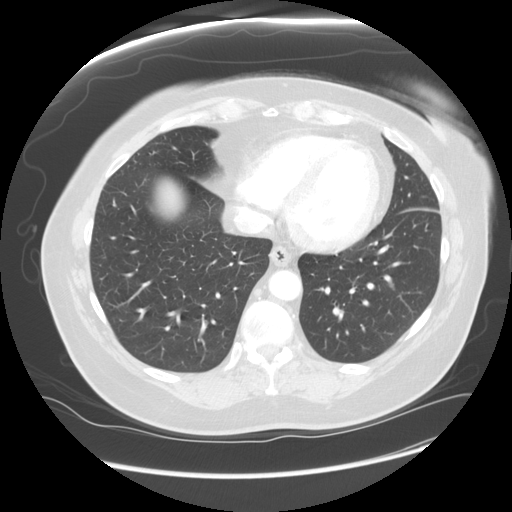

[12 of 46 positions shown; findings below may reference images not displayed]

FINDINGS: Stable right lower lobe calcified granuloma with
adjacent scarring.  Lung bases otherwise clear.  Normal heart size.
No pericardial or pleural effusion.

Abdomen:  There is a heavily calcified peripheral splenic artery
aneurysm, greatest diameter 13.5 mm.  No significant change in size
compared to the prior study.  Calcified splenic granulomata noted.
Calcified tiny gallstones noted dependently and also within the
gallbladder neck, images 82 and 61.

Pancreas, adrenal glands, and kidneys within normal limits for age.
The liver is enlarged with a 23 cm cranial caudal length.  Liver
parenchyma is diffusely heterogeneous.  There is micro nodularity
to the liver surface suspicious for early cirrhosis.  Portal vein
is patent.  No biliary dilatation or definite focal lesion in the
liver.  No bowel obstruction pattern, dilatation or ileus.  Diffuse
diverticulosis of the colon.

Degenerative changes of the spine.

 Review of the MIP images confirms the above findings.
IMPRESSION: Stable 13 mm peripheral heavily calcified splenic artery aneurysm.
No evidence of interval enlargement or acute hemorrhage.

Right lower lobe and splenic calcified granulomata.

Incidental tiny gallstones.

CTA PELVIS
FINDINGS: The common, internal, and external iliac arteries are
patent.  No iliac stenosis, occlusion, aneurysm or dissection.
Iliac vessels remain patent with mild tortuosity.  No
retroperitoneal hemorrhage noted.  No pelvic free fluid, fluid
collection, hemorrhage, abscess or adenopathy.  No inguinal hernia.
Diffuse extensive diverticulosis of the lower sigmoid

 Review of the MIP images confirms the above findings.
IMPRESSION: No acute vascular abnormality.  Patent iliac vessels with mild
atherosclerosis and tortuosity noted.

Sigmoid diverticulosis

## 2009-08-07 ENCOUNTER — Encounter: Admission: RE | Admit: 2009-08-07 | Discharge: 2009-08-07 | Payer: Self-pay | Admitting: Family Medicine

## 2009-08-07 IMAGING — CR DG CHEST 2V
2 series · 2 of 2 positions shown · non-contrast
Comparison: [DATE]

CLINICAL DATA: Cough

CHEST - 2 VIEW

[view not recorded (1 of 2)]
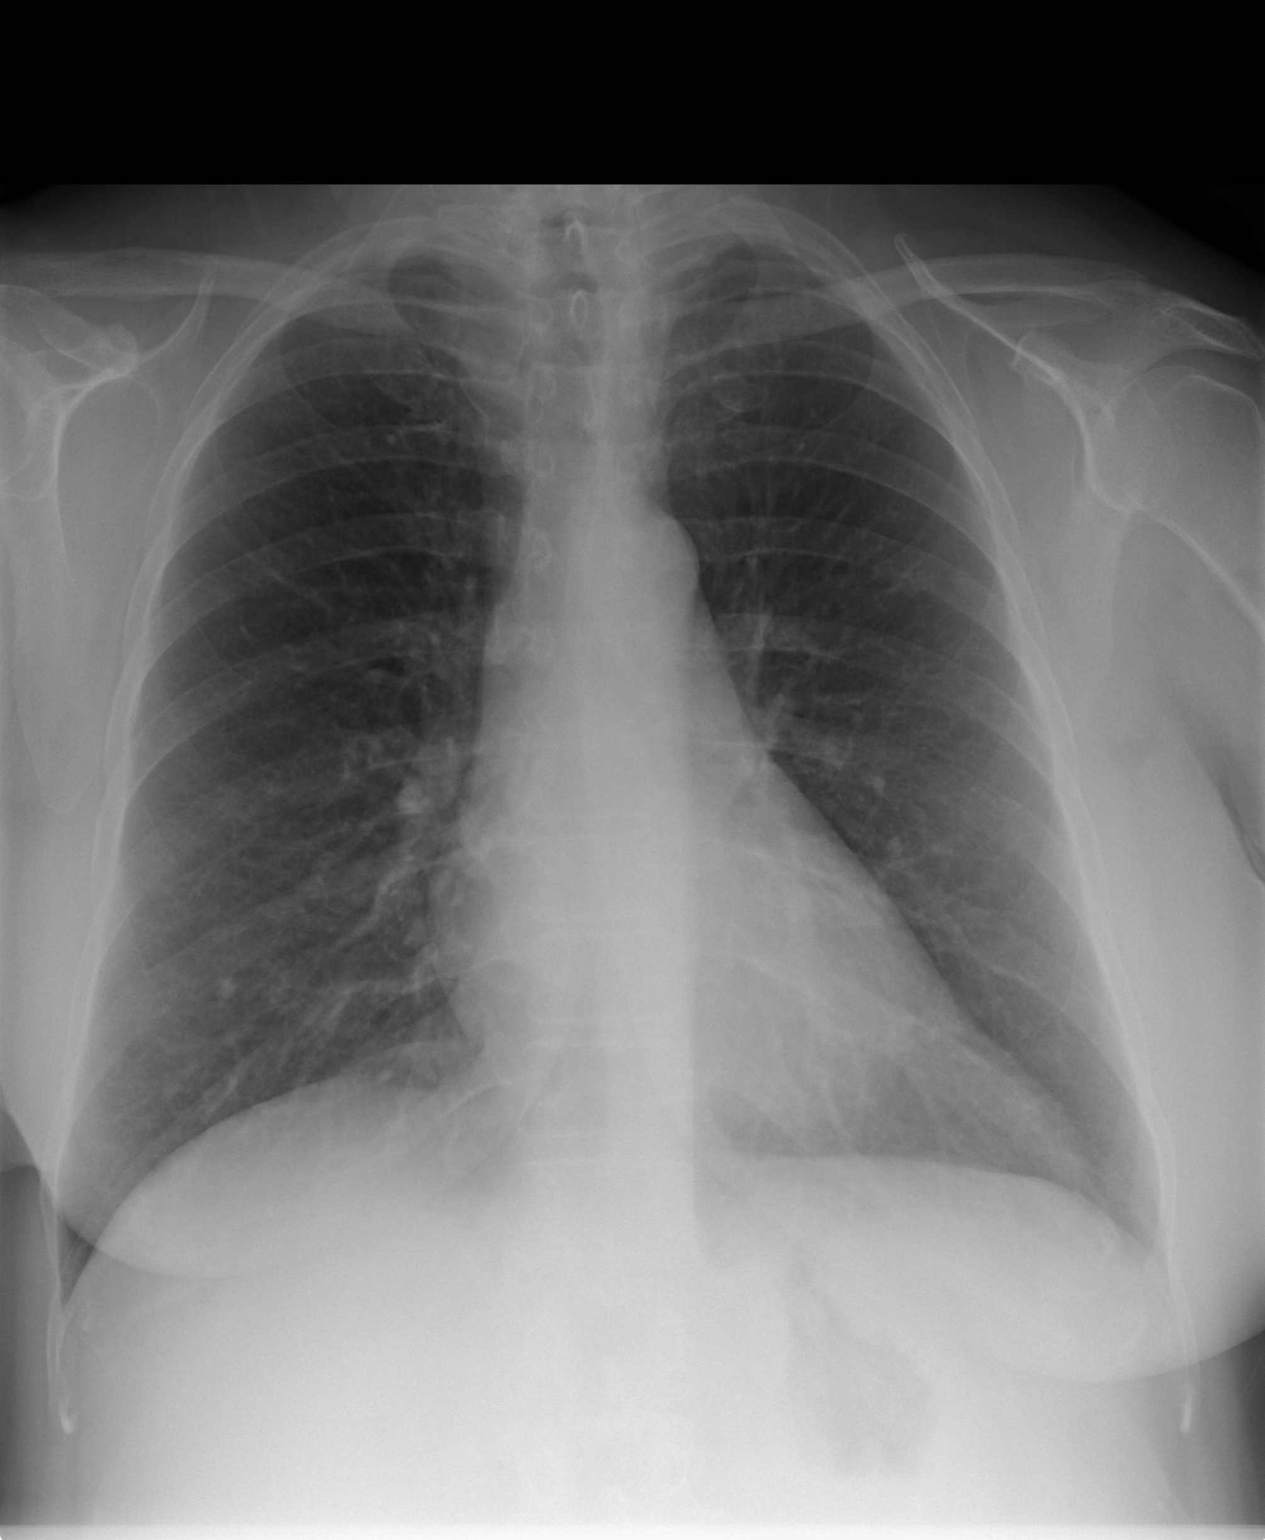

[view not recorded (2 of 2)]
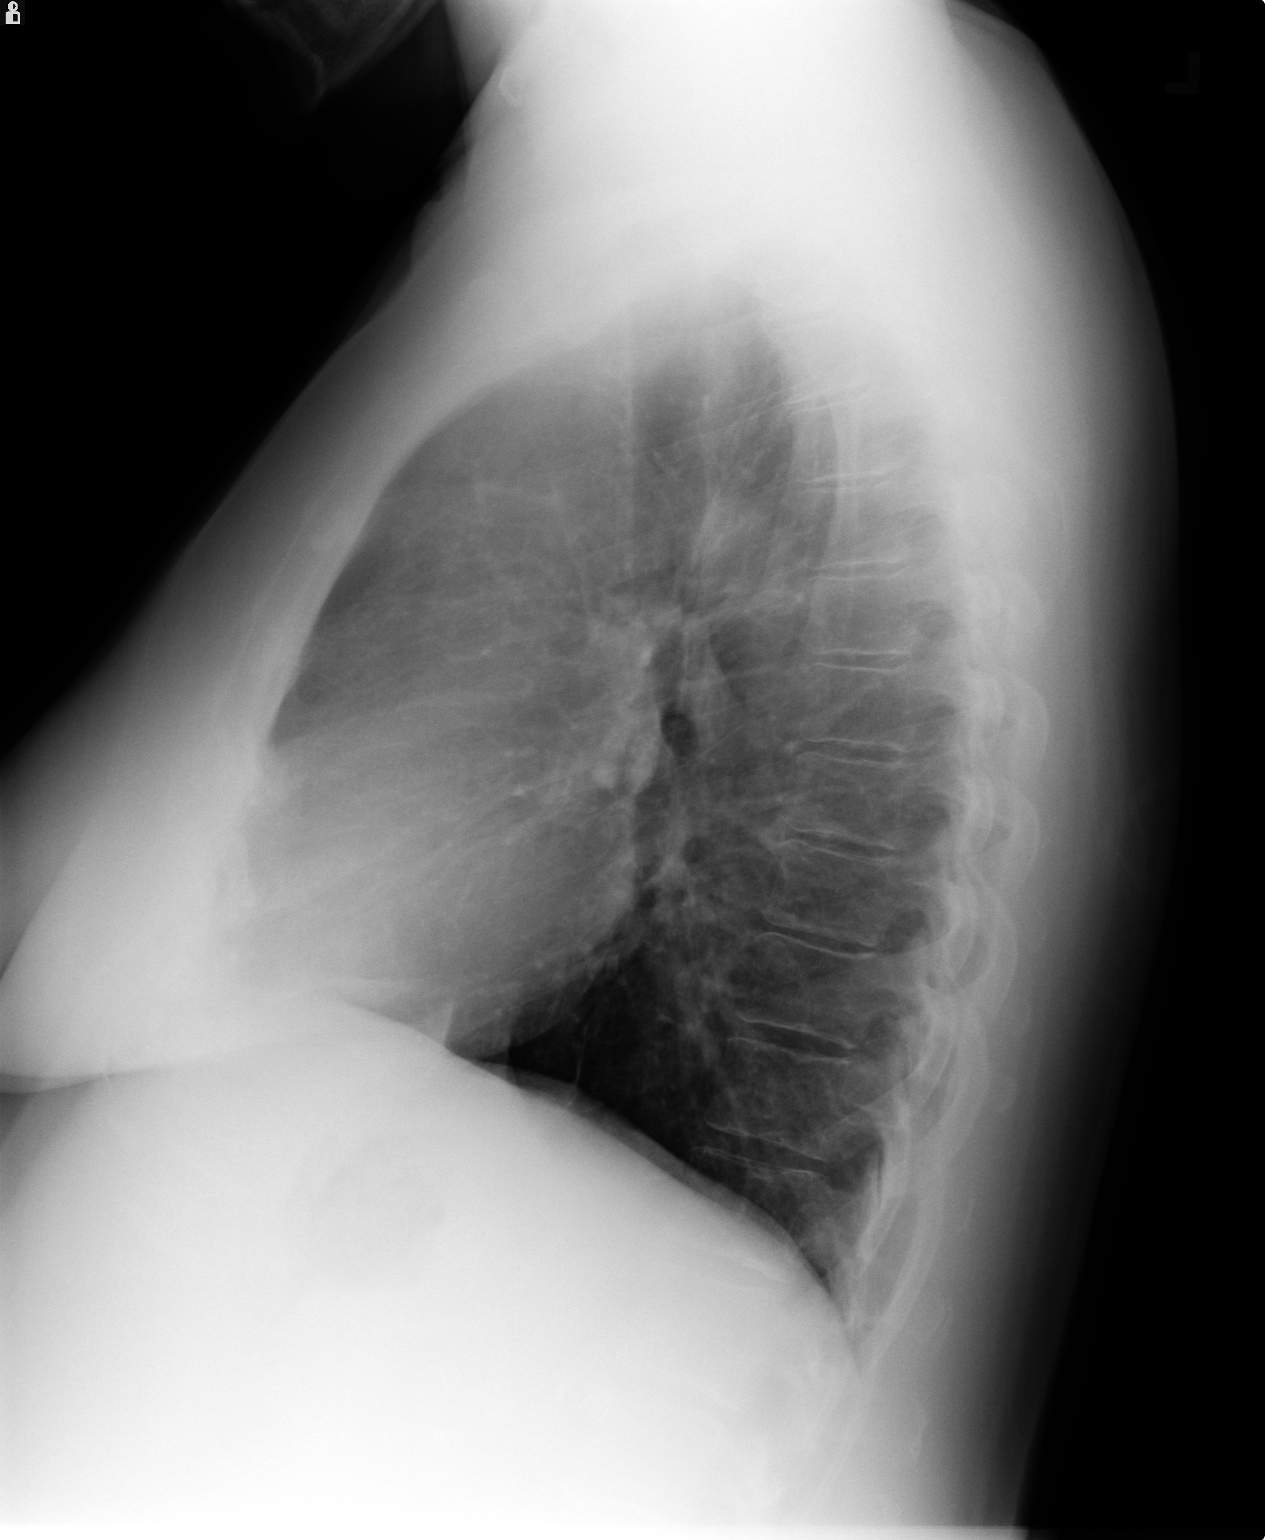

[2 of 2 positions shown; findings below may reference images not displayed]

FINDINGS: Calcified granulomata are stable.  Patchy density in the
left upper lung zone is stable.  No new pulmonary masses or
nodules.  No pneumothorax.  No pleural effusion.
IMPRESSION: Chronic changes but no active cardiopulmonary disease.

## 2009-10-01 ENCOUNTER — Encounter: Admission: RE | Admit: 2009-10-01 | Discharge: 2009-10-01 | Payer: Self-pay | Admitting: Family Medicine

## 2009-10-01 IMAGING — MG MM DIGITAL SCREENING
5 series · 5 of 5 positions shown · non-contrast
Comparison: none

DG SCREEN MAMMOGRAM BILATERAL
Bilateral CC and MLO view(s) were taken.

DIGITAL SCREENING MAMMOGRAM WITH CAD:
The breast tissue is almost entirely fatty.  No masses or malignant type calcifications are 
identified.  Compared with prior studies.
Images were processed with CAD.

[R CC]
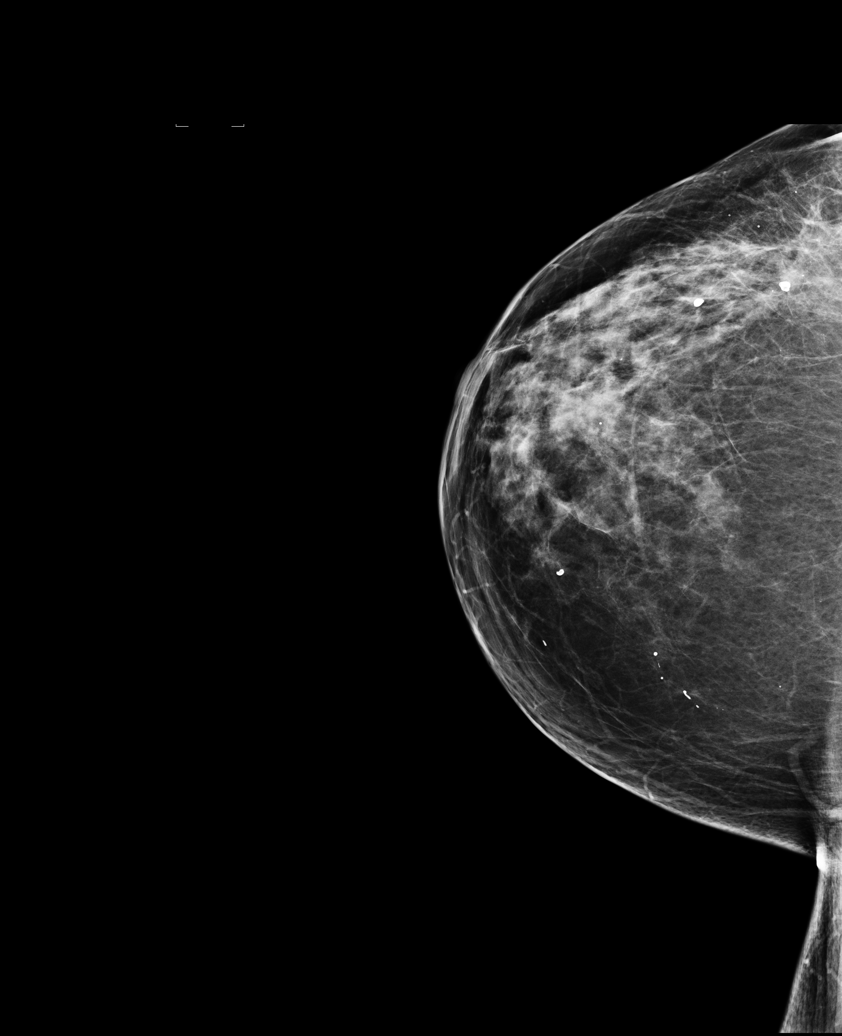

[L CC]
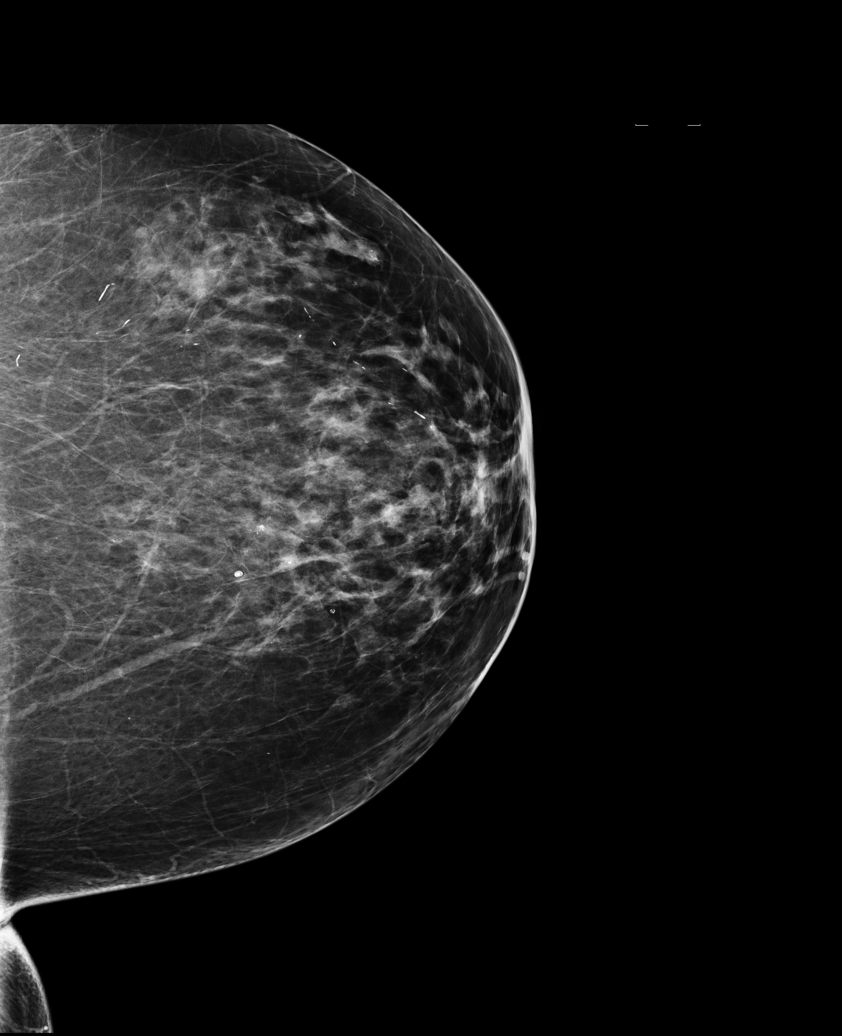

[L MLO]
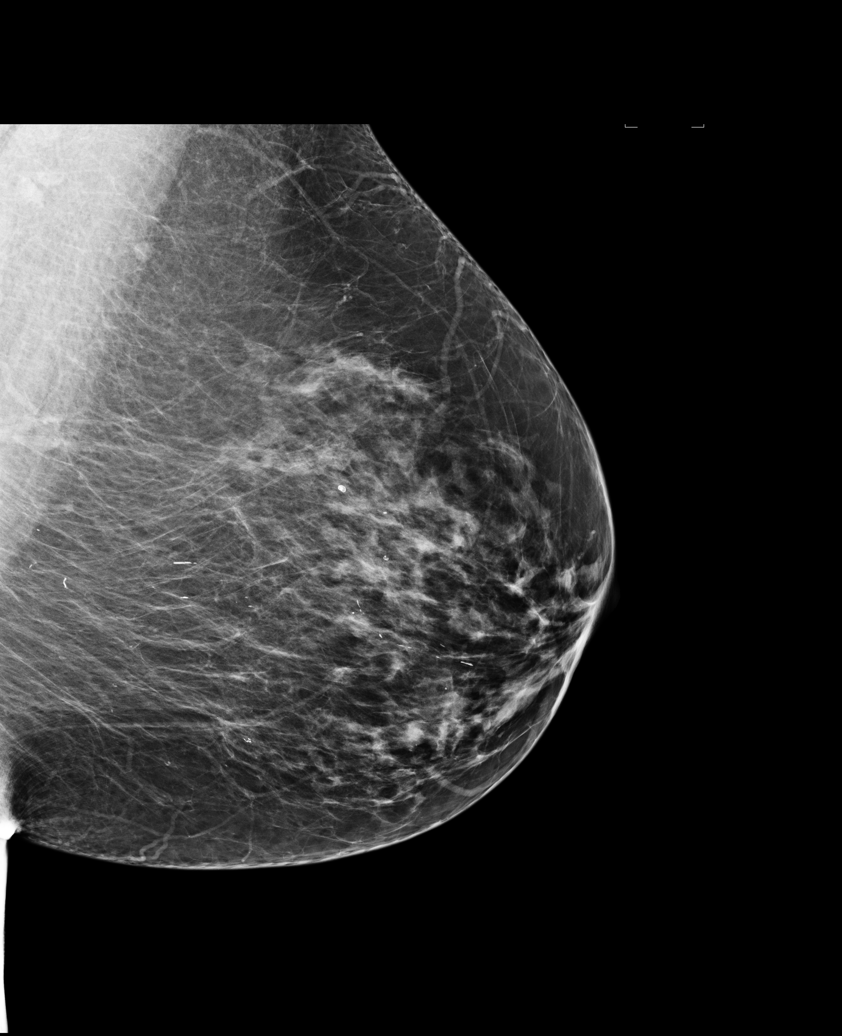

[R MLO]
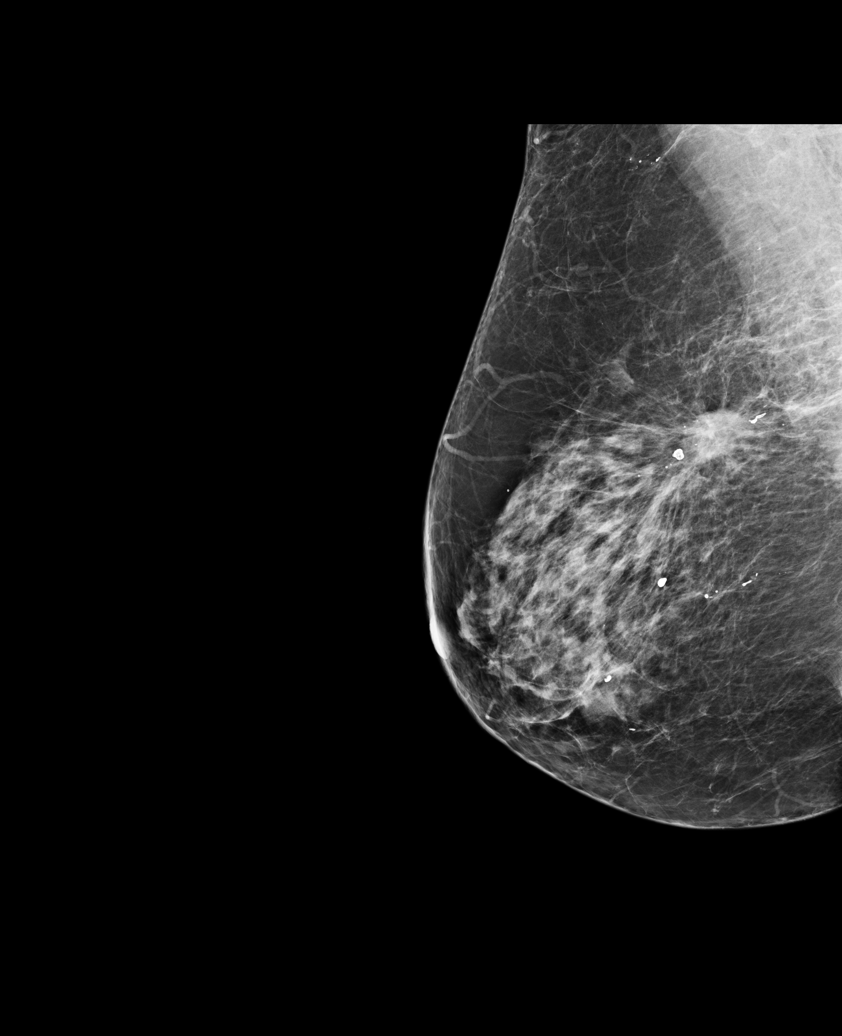

[R XCCL]
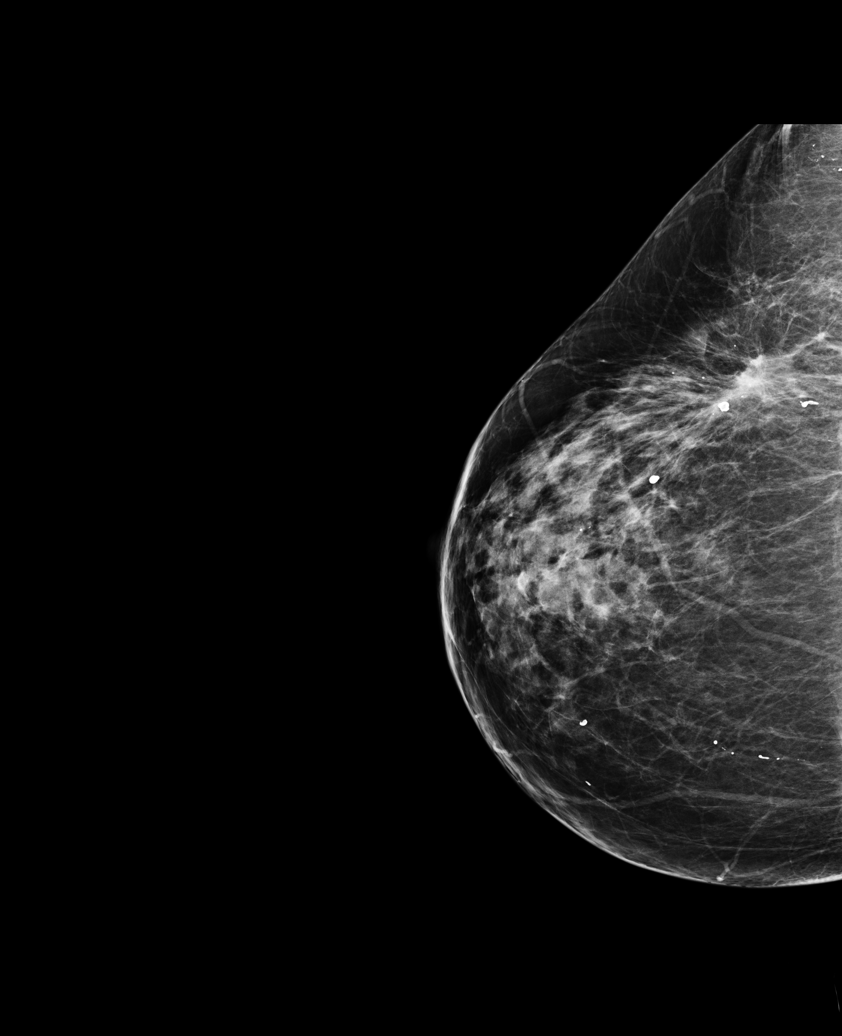

[5 of 5 positions shown; findings below may reference images not displayed]

IMPRESSION: No specific mammographic evidence of malignancy.  Next screening mammogram is recommended in one 
year.

A result letter of this screening mammogram will be mailed directly to the patient.

ASSESSMENT: Negative - BI-RADS 1

Screening mammogram in 1 year.
,

## 2010-02-05 ENCOUNTER — Encounter: Admission: RE | Admit: 2010-02-05 | Discharge: 2010-02-05 | Payer: Self-pay | Admitting: Family Medicine

## 2010-02-05 IMAGING — US US ABDOMEN COMPLETE
1 series · 14 of 25 positions shown · non-contrast
Comparison: [DATE]

CLINICAL DATA: Epigastric abdominal pain

ABDOMINAL ULTRASOUND COMPLETE

[Series 1: us abdomen complete · 0.30mm/px · 14 of 72 slices shown]
[im 1/72]
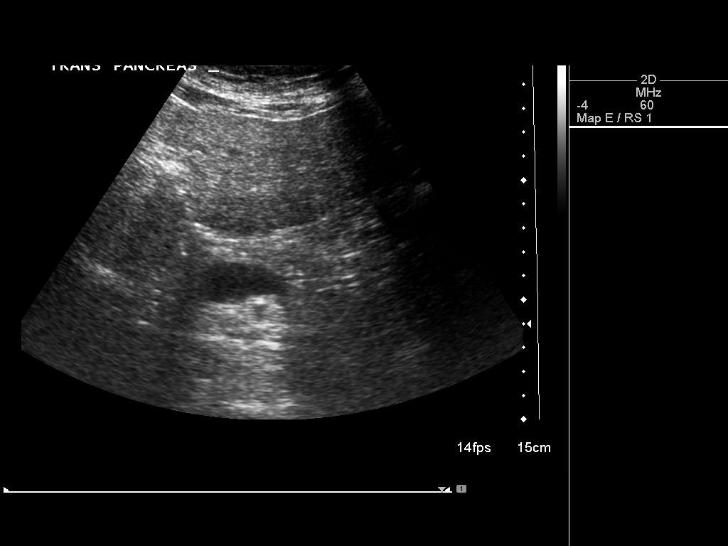
[im 6/72]
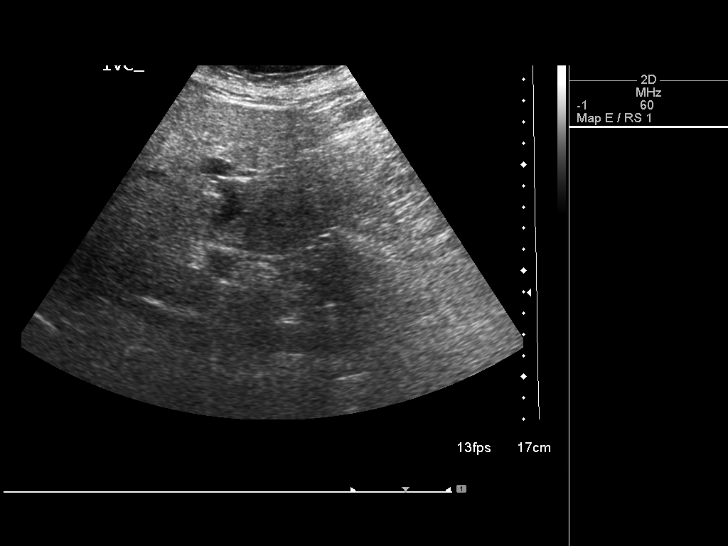
[im 12/72]
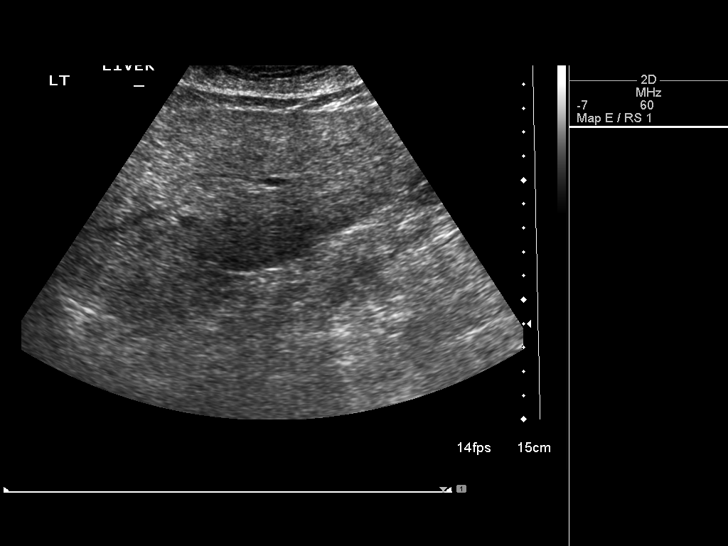
[im 18/72]
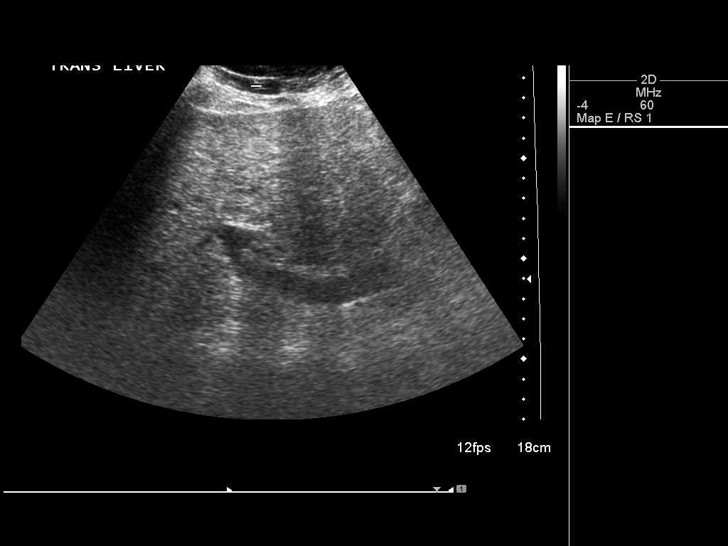
[im 24/72]
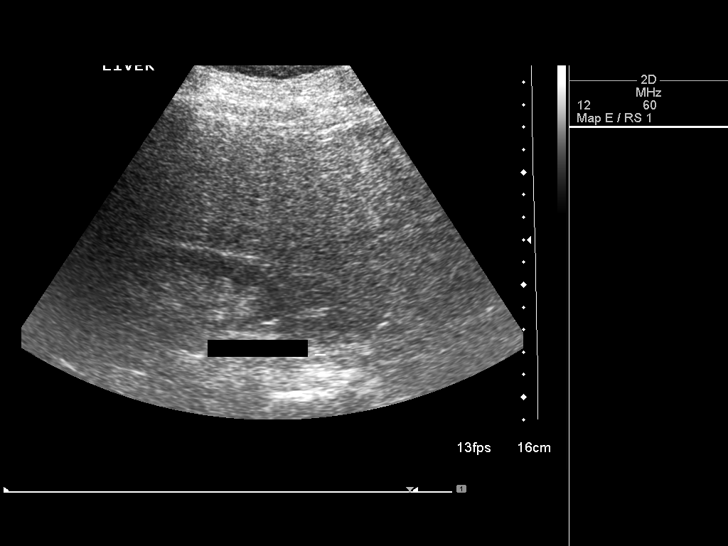
[im 27/72]
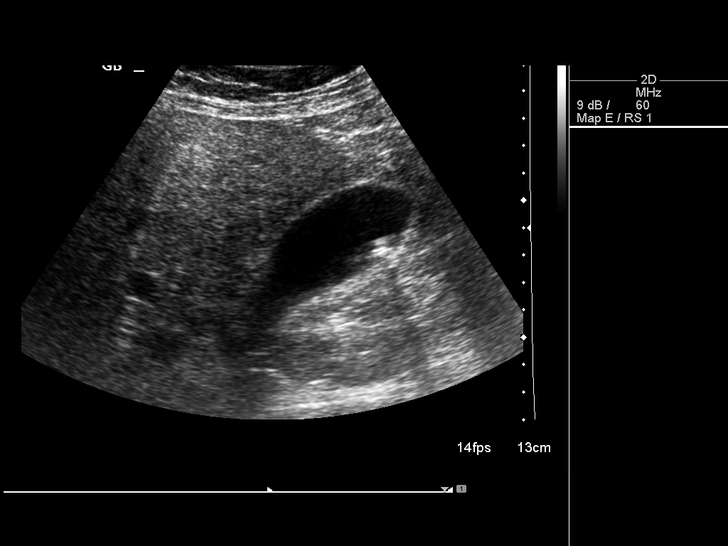
[im 33/72]
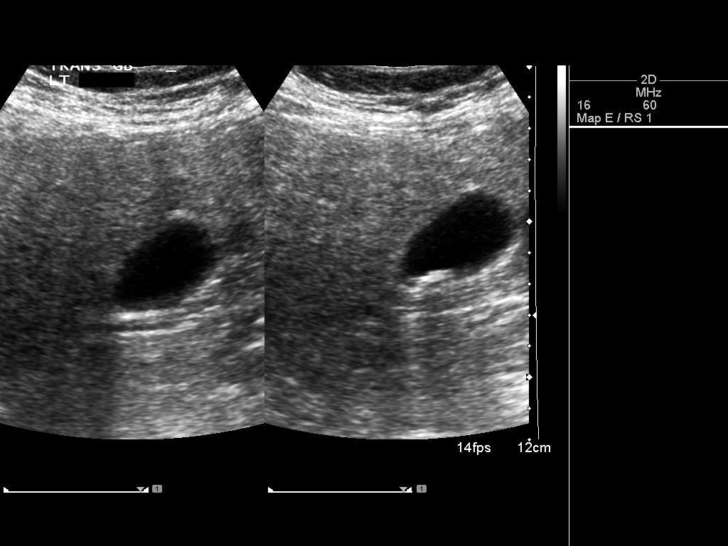
[im 39/72]
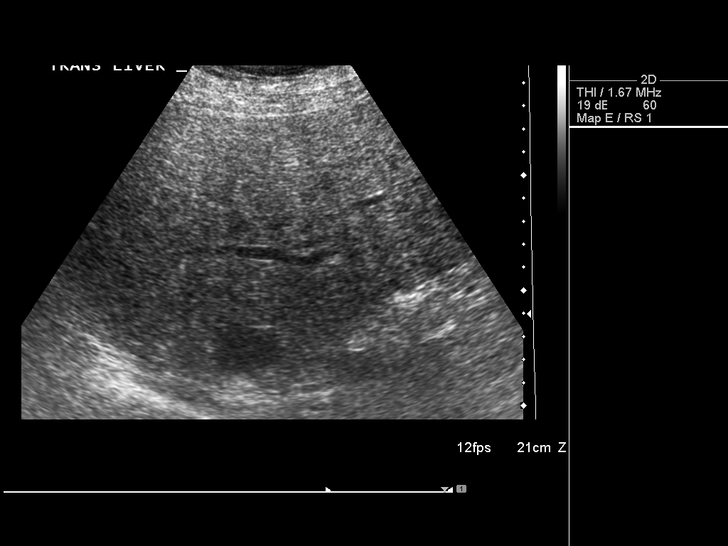
[im 45/72]
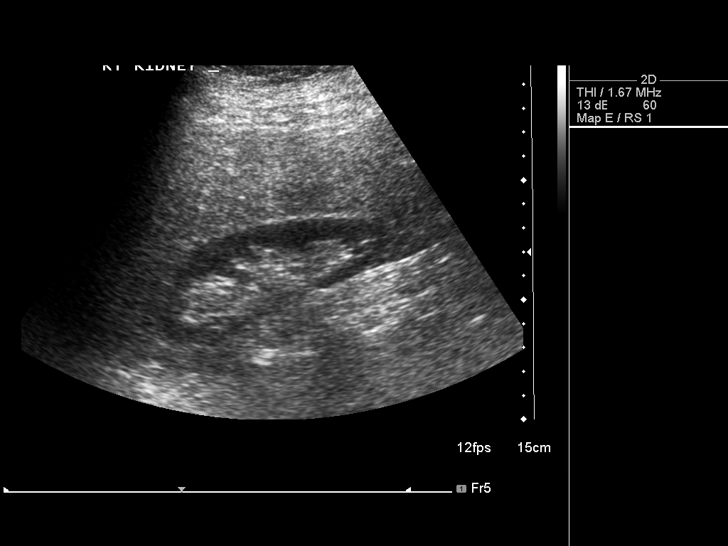
[im 48/72]
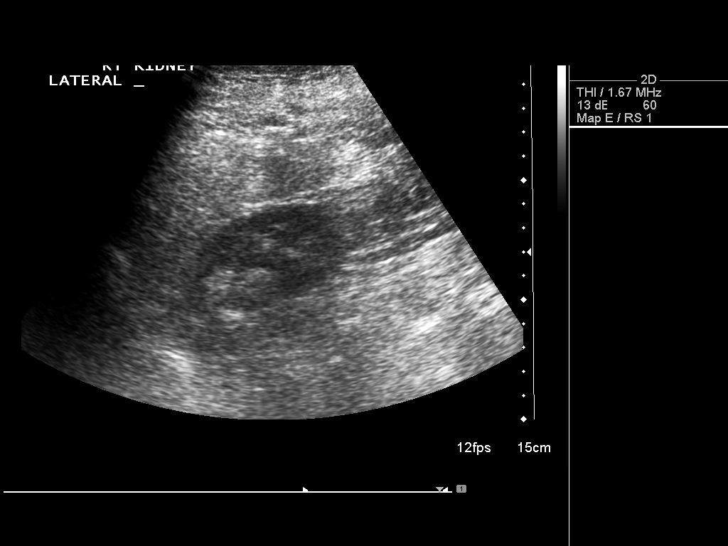
[im 54/72]
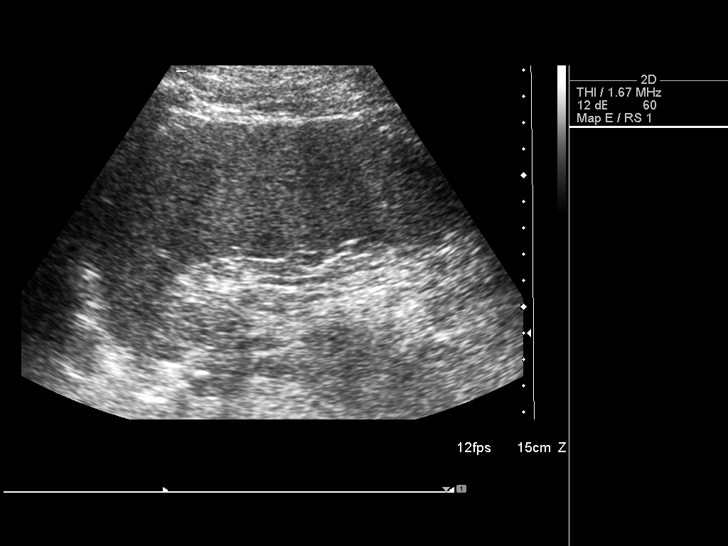
[im 60/72]
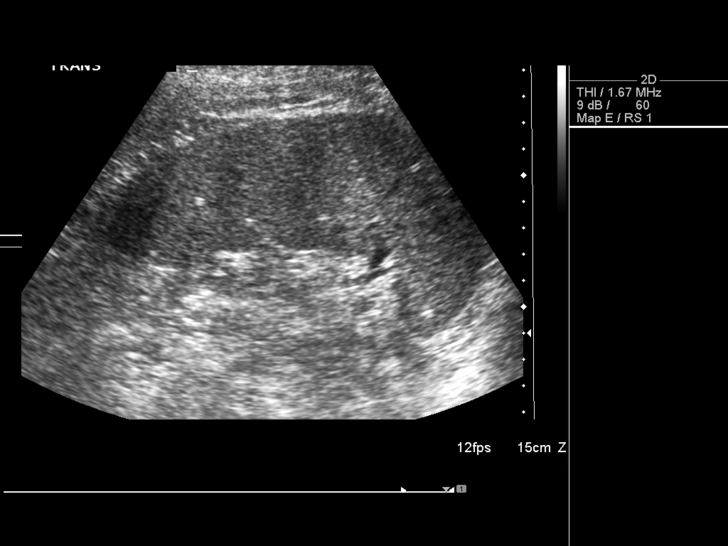
[im 66/72]
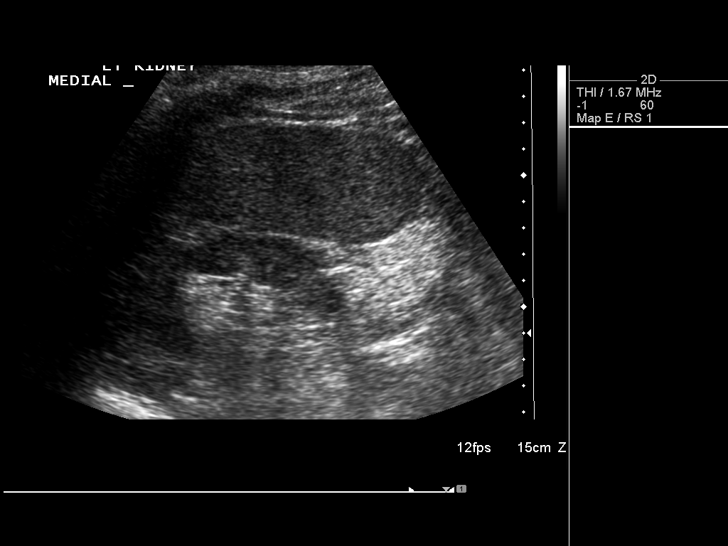
[im 72/72]
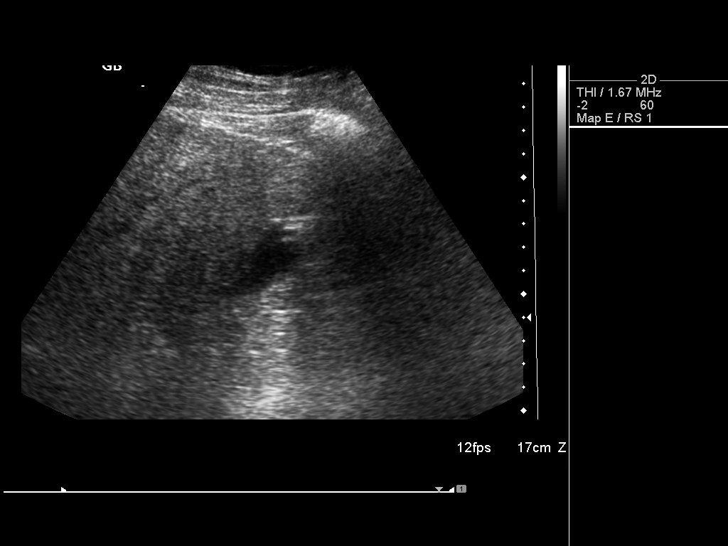

[14 of 25 positions shown; findings below may reference images not displayed]

FINDINGS: Gallbladder:  2 small echogenic mobile shadowing gallstones noted,
less than a centimeter in size.  No associated Murphy's sign, wall
thickening or pericholecystic fluid.

Common Bile Duct:  Within normal limits in caliber.

Liver: Diffuse increased echogenicity consistent with fatty
infiltration.  No focal hepatic abnormality.

IVC:  Appears normal.

Pancreas:  Limited visualization because of obscuring bowel gas.

Spleen:  13.5 cm length.  Normal echogenicity.  Scattered punctate
calcified granulomas noted.

Right kidney:  Normal in size and parenchymal echogenicity.  No
evidence of mass or hydronephrosis.

Left kidney:  Normal in size and parenchymal echogenicity.  No
evidence of mass or hydronephrosis.  Diffuse renal cortical
thinning.

Abdominal Aorta:  No aneurysm identified.
IMPRESSION: Cholelithiasis.
Hepatic steatosis
No acute finding by ultrasound.

## 2010-03-31 ENCOUNTER — Encounter: Payer: Self-pay | Admitting: Vascular Surgery

## 2010-04-10 HISTORY — PX: CHOLECYSTECTOMY: SHX55

## 2010-05-15 ENCOUNTER — Encounter (HOSPITAL_COMMUNITY)
Admission: RE | Admit: 2010-05-15 | Discharge: 2010-05-15 | Disposition: A | Discharge status: Home / Self Care | Payer: Medicare Other | Source: Ambulatory Visit | Attending: General Surgery | Admitting: General Surgery

## 2010-05-15 DIAGNOSIS — Z01810 Encounter for preprocedural cardiovascular examination: Secondary | ICD-10-CM | POA: Insufficient documentation

## 2010-05-15 DIAGNOSIS — Z01812 Encounter for preprocedural laboratory examination: Secondary | ICD-10-CM | POA: Insufficient documentation

## 2010-05-15 LAB — COMPREHENSIVE METABOLIC PANEL
ALT: 28 U/L (ref 0–35)
AST: 42 U/L — ABNORMAL HIGH (ref 0–37)
Albumin: 4.2 g/dL (ref 3.5–5.2)
Alkaline Phosphatase: 36 U/L — ABNORMAL LOW (ref 39–117)
BUN: 12 mg/dL (ref 6–23)
CO2: 24 mEq/L (ref 19–32)
Calcium: 9.5 mg/dL (ref 8.4–10.5)
Chloride: 101 mEq/L (ref 96–112)
Creatinine, Ser: 0.86 mg/dL (ref 0.4–1.2)
GFR calc Af Amer: >60 mL/min (ref >60)
GFR calc non Af Amer: >60 mL/min (ref >60)
Glucose, Bld: 149 mg/dL — ABNORMAL HIGH (ref 70–99)
Potassium: 4.5 mEq/L (ref 3.5–5.1)
Sodium: 138 mEq/L (ref 135–145)
Total Bilirubin: 0.6 mg/dL (ref 0.3–1.2)
Total Protein: 7.3 g/dL (ref 6.0–8.3)

## 2010-05-15 LAB — CBC
HCT: 41.5 % (ref 36.0–46.0)
Hemoglobin: 14.1 g/dL (ref 12.0–15.0)
MCH: 30.1 pg (ref 26.0–34.0)
MCHC: 34 g/dL (ref 30.0–36.0)
MCV: 88.5 fL (ref 78.0–100.0)
Platelets: 124 10*3/uL — ABNORMAL LOW (ref 150–400)
RBC: 4.69 MIL/uL (ref 3.87–5.11)
RDW: 13 % (ref 11.5–15.5)
WBC: 4.5 10*3/uL (ref 4.0–10.5)

## 2010-05-15 LAB — SURGICAL PCR SCREEN
MRSA, PCR: NEGATIVE
Staphylococcus aureus: NEGATIVE

## 2010-05-15 LAB — DIFFERENTIAL
Basophils Absolute: 0 10*3/uL (ref 0.0–0.1)
Basophils Relative: 1 % (ref 0–1)
Eosinophils Absolute: 0.1 10*3/uL (ref 0.0–0.7)
Eosinophils Relative: 3 % (ref 0–5)
Lymphocytes Relative: 24 % (ref 12–46)
Lymphs Abs: 1.1 10*3/uL (ref 0.7–4.0)
Monocytes Absolute: 0.3 10*3/uL (ref 0.1–1.0)
Monocytes Relative: 7 % (ref 3–12)
Neutro Abs: 2.9 10*3/uL (ref 1.7–7.7)
Neutrophils Relative %: 65 % (ref 43–77)

## 2010-05-16 ENCOUNTER — Other Ambulatory Visit: Payer: Self-pay | Admitting: General Surgery

## 2010-05-16 ENCOUNTER — Observation Stay (HOSPITAL_COMMUNITY)
Admission: RE | Admit: 2010-05-16 | Discharge: 2010-05-17 | Disposition: A | Discharge status: Home / Self Care | Payer: Medicare Other | Source: Ambulatory Visit | Attending: General Surgery | Admitting: General Surgery

## 2010-05-16 DIAGNOSIS — R1013 Epigastric pain: Secondary | ICD-10-CM | POA: Insufficient documentation

## 2010-05-16 DIAGNOSIS — E119 Type 2 diabetes mellitus without complications: Secondary | ICD-10-CM | POA: Insufficient documentation

## 2010-05-16 DIAGNOSIS — K219 Gastro-esophageal reflux disease without esophagitis: Secondary | ICD-10-CM | POA: Insufficient documentation

## 2010-05-16 DIAGNOSIS — J40 Bronchitis, not specified as acute or chronic: Secondary | ICD-10-CM | POA: Insufficient documentation

## 2010-05-16 DIAGNOSIS — K7689 Other specified diseases of liver: Secondary | ICD-10-CM | POA: Insufficient documentation

## 2010-05-16 DIAGNOSIS — E039 Hypothyroidism, unspecified: Secondary | ICD-10-CM | POA: Insufficient documentation

## 2010-05-16 DIAGNOSIS — K8010 Calculus of gallbladder with chronic cholecystitis without obstruction: Principal | ICD-10-CM | POA: Insufficient documentation

## 2010-05-16 DIAGNOSIS — E785 Hyperlipidemia, unspecified: Secondary | ICD-10-CM | POA: Insufficient documentation

## 2010-05-16 LAB — GLUCOSE, CAPILLARY
Glucose-Capillary: 110 mg/dL — ABNORMAL HIGH (ref 70–99)
Glucose-Capillary: 164 mg/dL — ABNORMAL HIGH (ref 70–99)

## 2010-05-17 LAB — COMPREHENSIVE METABOLIC PANEL
ALT: 32 U/L (ref 0–35)
AST: 52 U/L — ABNORMAL HIGH (ref 0–37)
Albumin: 3.5 g/dL (ref 3.5–5.2)
Alkaline Phosphatase: 28 U/L — ABNORMAL LOW (ref 39–117)
BUN: 13 mg/dL (ref 6–23)
CO2: 27 mEq/L (ref 19–32)
Calcium: 8.8 mg/dL (ref 8.4–10.5)
Chloride: 101 mEq/L (ref 96–112)
Creatinine, Ser: 1.09 mg/dL (ref 0.4–1.2)
GFR calc Af Amer: >60 mL/min (ref >60)
GFR calc non Af Amer: 50 mL/min — ABNORMAL LOW (ref >60)
Glucose, Bld: 131 mg/dL — ABNORMAL HIGH (ref 70–99)
Potassium: 4.2 mEq/L (ref 3.5–5.1)
Sodium: 136 mEq/L (ref 135–145)
Total Bilirubin: 1.2 mg/dL (ref 0.3–1.2)
Total Protein: 6.5 g/dL (ref 6.0–8.3)

## 2010-05-17 LAB — CBC
HCT: 38 % (ref 36.0–46.0)
Hemoglobin: 12.8 g/dL (ref 12.0–15.0)
MCH: 29.8 pg (ref 26.0–34.0)
MCHC: 33.7 g/dL (ref 30.0–36.0)
MCV: 88.6 fL (ref 78.0–100.0)
Platelets: 119 10*3/uL — ABNORMAL LOW (ref 150–400)
RBC: 4.29 MIL/uL (ref 3.87–5.11)
RDW: 13.1 % (ref 11.5–15.5)
WBC: 6.2 10*3/uL (ref 4.0–10.5)

## 2010-05-17 LAB — GLUCOSE, CAPILLARY: Glucose-Capillary: 142 mg/dL — ABNORMAL HIGH (ref 70–99)

## 2010-05-23 ENCOUNTER — Other Ambulatory Visit: Payer: Self-pay | Admitting: Gastroenterology

## 2010-05-23 DIAGNOSIS — R7989 Other specified abnormal findings of blood chemistry: Secondary | ICD-10-CM

## 2010-05-23 DIAGNOSIS — K7460 Unspecified cirrhosis of liver: Secondary | ICD-10-CM

## 2010-05-23 NOTE — Op Note (Signed)
Catherine Decker, Catherine Decker               ACCOUNT NO.:  [id number]  MEDICAL RECORD NO.:  [id number]           PATIENT TYPE:  I  LOCATION:  5155                         FACILITY:  [facility]  PHYSICIAN:  Juanetta Gosling, MDDATE OF BIRTH:  January 18, 1941  DATE OF PROCEDURE:  05/16/2010 DATE OF DISCHARGE:  05/15/2010                              OPERATIVE REPORT   PREOPERATIVE DIAGNOSIS:  Symptomatic cholelithiasis.  POSTOPERATIVE DIAGNOSIS:  Symptomatic cholelithiasis.  PROCEDURE:  Laparoscopic cholecystectomy.  SURGEON:  Juanetta Gosling, MD  ASSISTANT:  Dr. Cyndia Bent, for about 30 minutes.  ANESTHESIA:  General.  ANESTHESIOLOGIST:  Bedelia Person, MD  SPECIMENS:  Gallbladder and contents to Pathology.  ESTIMATED BLOOD LOSS:  75 mL.  COMPLICATIONS:  None.  DRAINS:  None.  DISPOSITION:  To recovery room in stable condition.  FINDINGS:  Significant macronodular cirrhosis of the liver.  INDICATIONS:  This is a 70 year old female who has had an episode of right upper quadrant pain and epigastric pain radiated around her back, there was also associated somewhat with some difficulty swallowing, thought this was abnormal reflux.  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  I saw her after this single episode and she was well with no abdominal pain.  Her liver function tests were all essentially normal and right upper quadrant ultrasound showed 2 small echogenic mobile stones less than 1 cm in size with no other abnormalities and some hepatic steatosis.  We discussed that her symptoms seem consistent with the gallbladder disease.  We talked about a laparoscopic cholecystectomy.  PROCEDURE:  After informed consent was obtained, the patient was taken to the operating room.  She was then measured 1 g of intravenous cefoxitin.  Sequential compression devices were placed on lower extremities prior to induction of anesthesia.  She was then  placed under general anesthesia without complication.  Her abdomen was prepped and draped in standard sterile surgical fashion.  Surgical time-out was then performed.  I infiltrated with 0.25% Marcaine above her umbilicus.  She had had a prior surgery with an infraumbilical incision.  I then carried this down to the fascia.  This was entered sharply and her peritoneum was entered bluntly.  I then placed a 0-Vicryl pursestring suture through her fascia.  I then introduced a Hasson trocar and insufflator at 15 mmHg pressure.  Upon entering, I noticed that she had some fairly significant macronodular cirrhosis present.  I then placed three further 5-mm ports in the epigastrium and right upper quadrant after infiltration with local anesthetic under direct vision.  I then placed her in the reversed Trendelenburg position.  The gallbladder was retracted cephalad, this was very difficult.  The gallbladder was paper thin and was very friable and her liver was very difficult to maneuver due to the stiffness.  I was able to eventually retract the gallbladder cephalad.  I made a rent in the gallbladder, did spill bile during the procedure and eventually was able to grasp what appeared to be down to the triangle of Calot and dissected and it did obtain the critical view of safety.  This was done with some fairly significant difficulty though and there was a small bleeder right near the common duct which I cauterized.  Otherwise I eventually was able to identify the structures.  I did elect not to perform a cholangiogram due to the location of her anatomy and the fact that I clearly saw the critical view.  This would be done in a very deep hole that was difficult to dissect the structures due to the fact that her liver was very stiff, but I was able to clearly identify the structures.  I clipped the main branch of the artery proximally and distally.  I clipped the other anterior and posterior branch  which were visualize well, this was divided.  I then clipped the cystic duct as well as a small portion of what appeared to be the gallbladder and divided this.  There were some stones present in that distal segment of the cystic duct which I evacuated as well.  This all appeared hemostatic.  I then removed the gallbladder from the liver bed, this was not very difficult.  I then placed this in EndoCatch bag and removed from the umbilicus.  I then obtained hemostasis.  Irrigation was performed.  I did place a piece of Surgicel snow in the liver bed as well.  This was hemostatic upon completion.  I looked the remainder of her liver and again the whole entire liver appeared to have macronodular appearance.  I then removed my Hasson trocar and I tied this down.  I viewed this from the epigastric incision.  There was no evidence of an entry injury.  I then tied my pursestring, this obliterated the defect. I then desufflated the abdomen, removed all trocars and there were then closed with a 4-0 Monocryl in subcuticular fashion.  Dermabond was placed over all the wound.  She tolerated this well, was transferred to recovery room and she is going to remain overnight following this.     Juanetta Gosling, MD     MCW/MEDQ  D:  05/16/2010  T:  05/17/2010  Job:  [id number]  cc:   Tally Joe, M.D.  Electronically Signed by Emelia Loron MD on 05/23/2010 09:26:01 AM

## 2010-07-08 ENCOUNTER — Other Ambulatory Visit: Payer: Medicare Other

## 2010-07-10 ENCOUNTER — Other Ambulatory Visit: Payer: Self-pay | Admitting: Vascular Surgery

## 2010-07-10 DIAGNOSIS — I728 Aneurysm of other specified arteries: Secondary | ICD-10-CM

## 2010-07-22 ENCOUNTER — Ambulatory Visit
Admission: RE | Admit: 2010-07-22 | Discharge: 2010-07-22 | Disposition: A | Discharge status: Home / Self Care | Payer: Medicare Other | Source: Ambulatory Visit | Attending: Gastroenterology | Admitting: Gastroenterology

## 2010-07-22 DIAGNOSIS — K7460 Unspecified cirrhosis of liver: Secondary | ICD-10-CM

## 2010-07-22 DIAGNOSIS — R7989 Other specified abnormal findings of blood chemistry: Secondary | ICD-10-CM

## 2010-07-22 IMAGING — CT CT ABDOMEN WO/W CM
2 of 8 series · 13 of 36 positions shown, 19 images · IV contrast (READICAT/WATER & [ID] OMNI 350)
Comparison: CT [DATE]

CLINICAL DATA: Cirrhosis and elevated LFTs. Hepatitis.

CT ABDOMEN WITHOUT AND WITH CONTRAST
TECHNIQUE: Multidetector CT imaging of the abdomen was performed
following the standard protocol before and during bolus
administration of intravenous contrast.
Contrast: 100 ml Omnipaque 300

[Series 4: arterial, portal venous · axial · arterial · 0.76mm/px · z∈[-308,-49]mm · 10 of 254 slices shown, 16 images]
[im 24/254  soft-tissue]
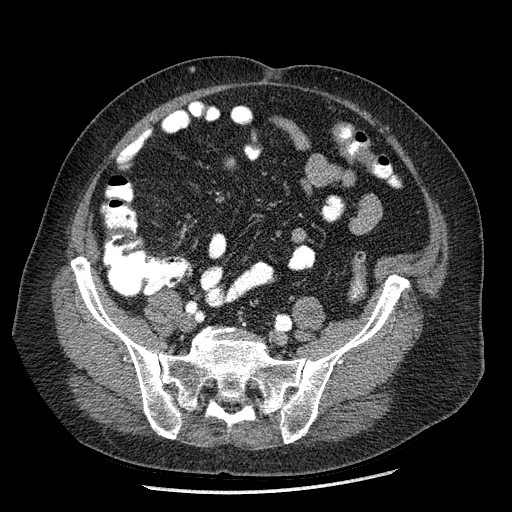
[im 24/254  bone]
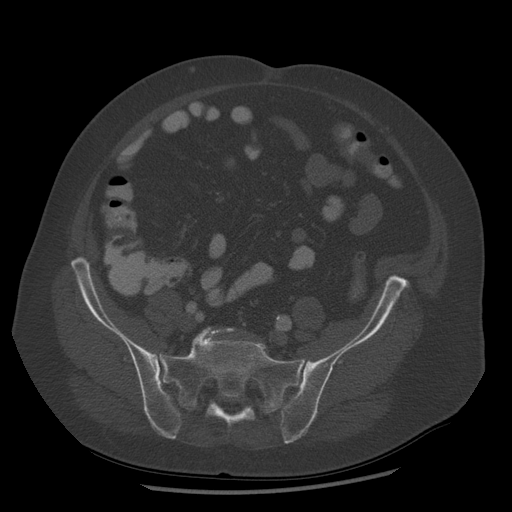
[im 47/254  soft-tissue]
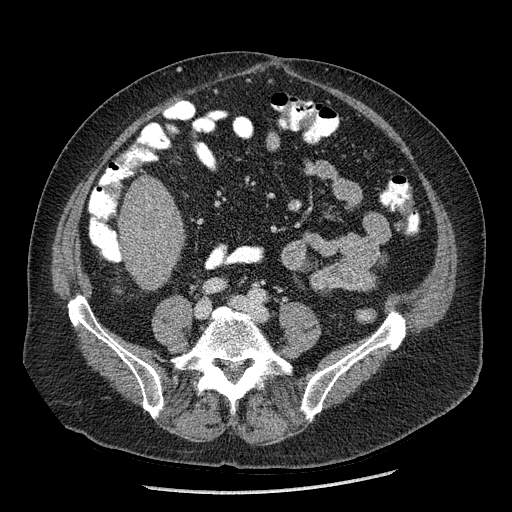
[im 70/254  soft-tissue]
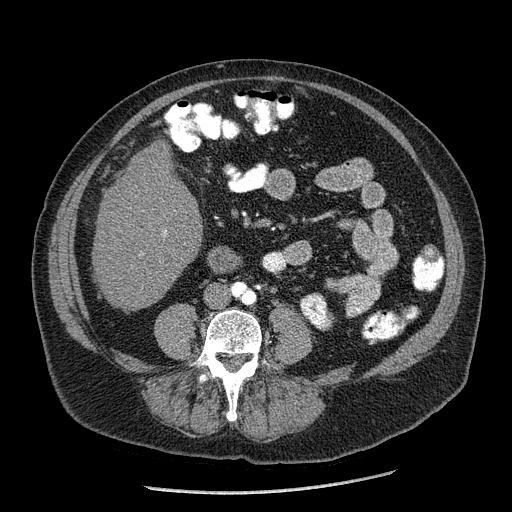
[im 93/254  soft-tissue]
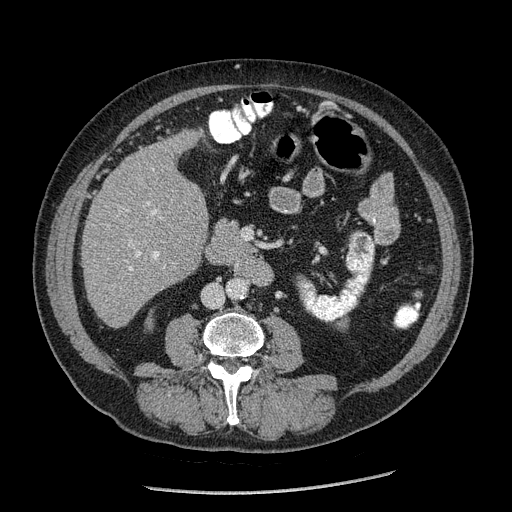
[im 116/254  soft-tissue]
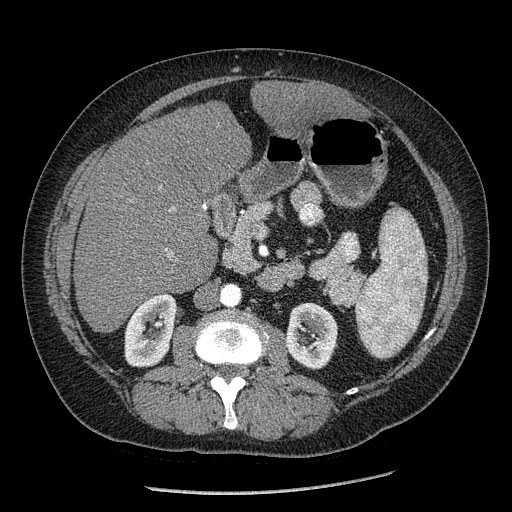
[im 139/254  soft-tissue]
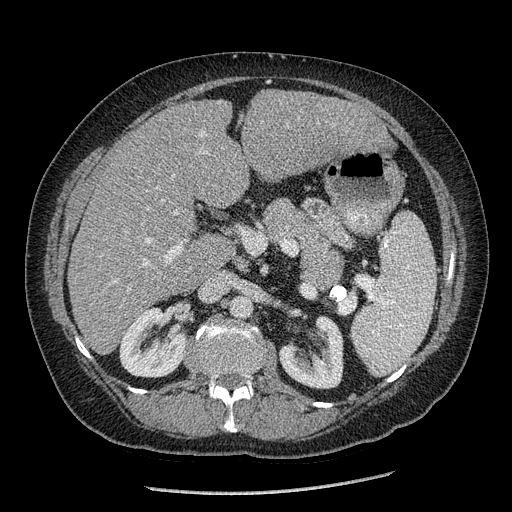
[im 162/254  soft-tissue]
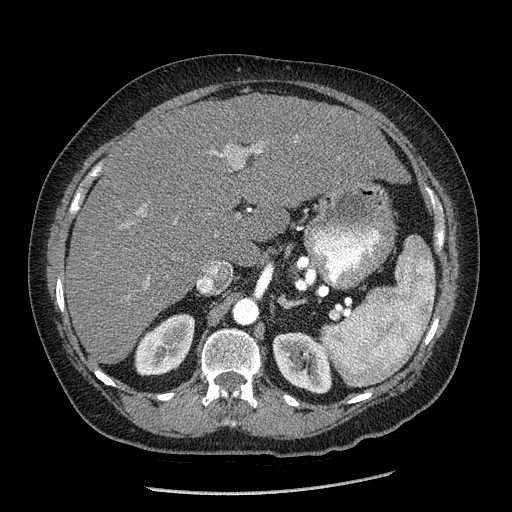
[im 162/254  lung]
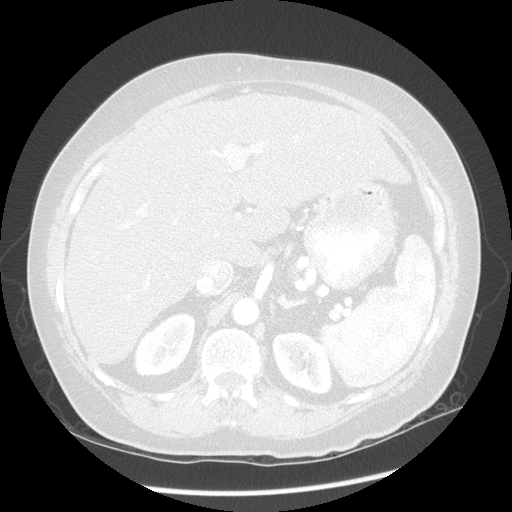
[im 185/254  soft-tissue]
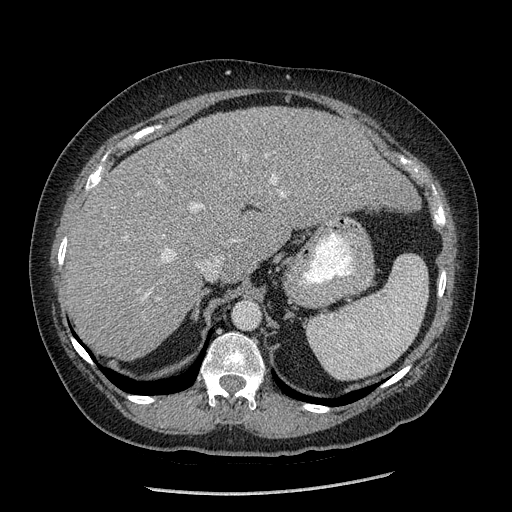
[im 185/254  lung]
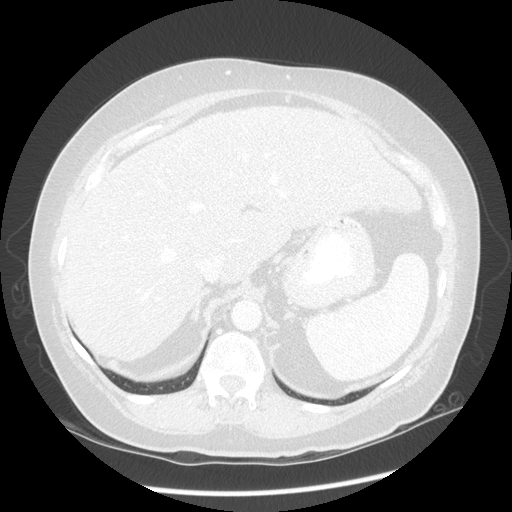
[im 208/254  soft-tissue]
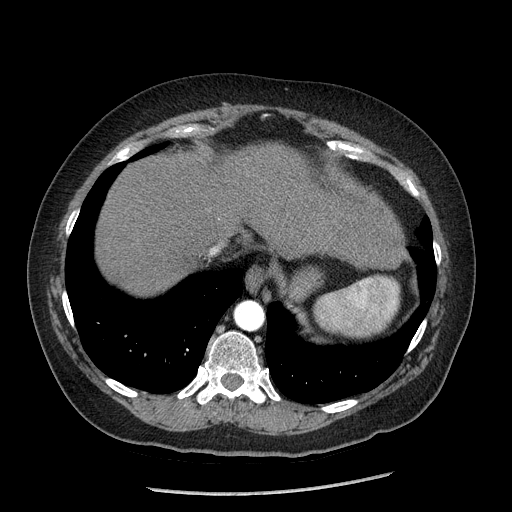
[im 208/254  lung]
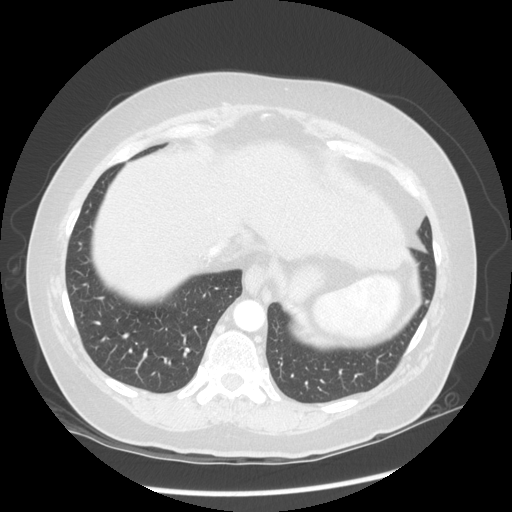
[im 208/254  bone]
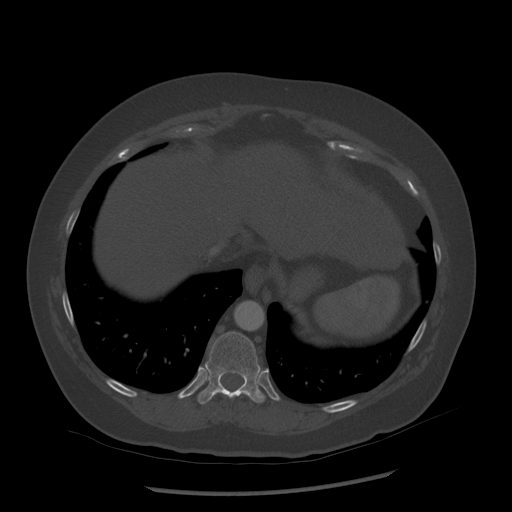
[im 231/254  soft-tissue]
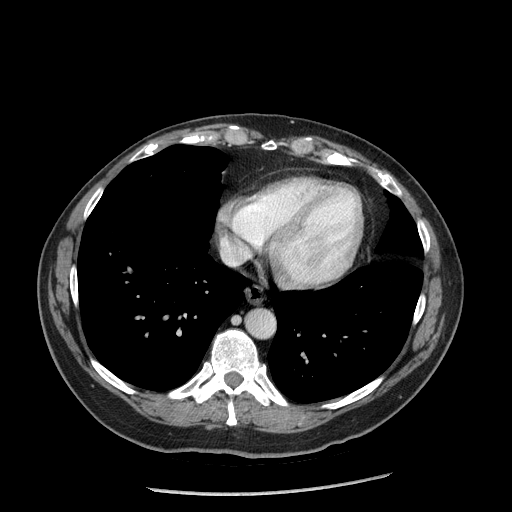
[im 231/254  lung]
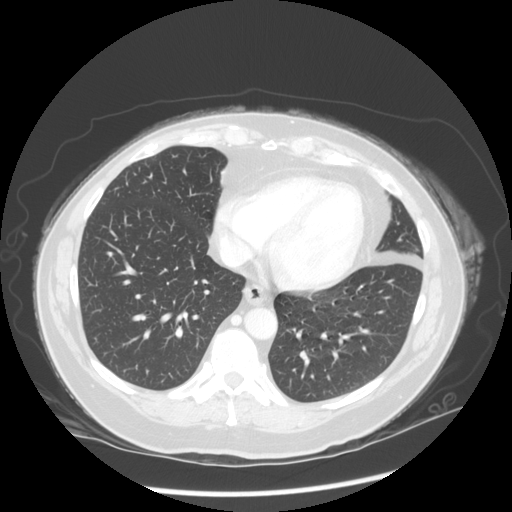

[Series 602: sagittal body · sagittal · 0.76mm/px · 3 of 152 slices shown]
[im 26/152  soft-tissue]
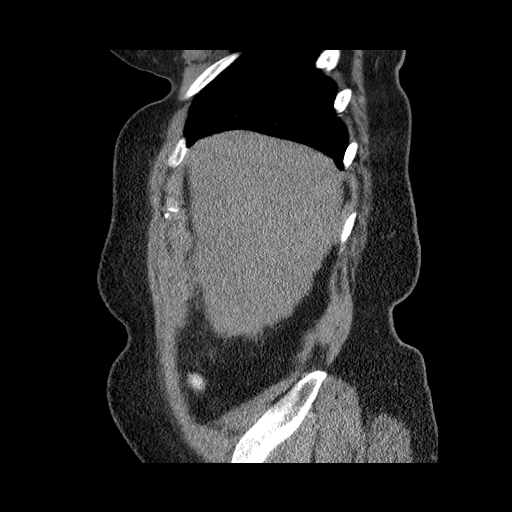
[im 51/152  soft-tissue]
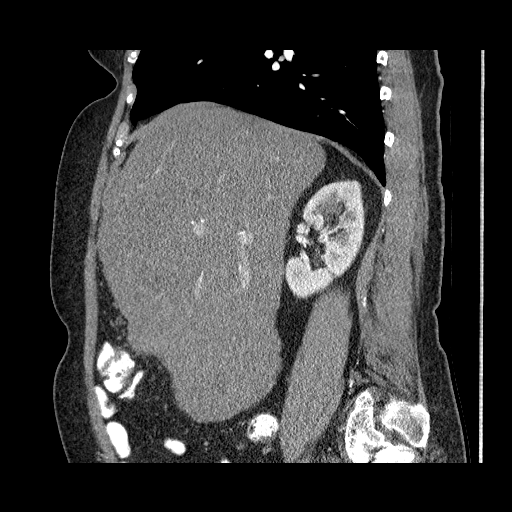
[im 76/152  soft-tissue]
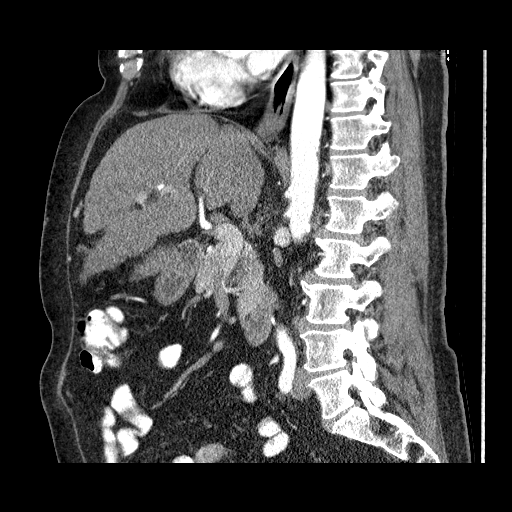

[13 of 36 positions shown; findings below may reference images not displayed]

FINDINGS: The  liver is enlarged and has a fine nodular
contour.There are no early arterial enhancing lesions within the
liver parenchyma.  No abnormal enhancing lesion on the portal
venous phase.  The portal veins are patent.  The splenic vein is
patent.

Pancreas, spleen, adrenal glands, and kidneys are normal. 12 mm
splenic artery aneurysm is stable.

The stomach, small bowel, appendix and limited view the colon are
normal.

There are small periaortic and periportal lymph nodes which are
unchanged from prior.

Review bone windows is unremarkable.
IMPRESSION: 1.  Enlarged nodular liver consistent with cirrhosis.
2.  No enhancing lesion to suggest hepatoma.

## 2010-07-22 MED ORDER — IOHEXOL 350 MG/ML SOLN
100.0000 mL | Freq: Once | INTRAVENOUS | Status: AC | PRN
  Administered 2010-07-22: 100 mL via INTRAVENOUS

## 2010-07-23 NOTE — Procedures (Signed)
VASCULAR LAB EXAM   INDICATION:  Splenic artery aneurysm, rule out popliteal artery  aneurysms.   HISTORY:  Diabetes:  Yes.  Cardiac:  No.  Hypertension:  No.   EXAM:  Bilateral popliteal artery duplex.   IMPRESSION:  1. No evidence of aneurysmal dilatation of the bilateral popliteal      arteries.  2. Maximum diameter measurements of the popliteal arteries are 0.8 x      0.9 cm on the right side and 0.9 x 0.9 cm on the left side.   ___________________________________________  Janetta Hora Fields, MD   CH/MEDQ  D:  06/07/2008  T:  06/07/2008  Job:  [id number]

## 2010-07-23 NOTE — Assessment & Plan Note (Signed)
OFFICE VISIT   Catherine Decker, Catherine Decker  DOB:  [birth year]                                       06/06/2009  ZOXWR#:[id number]   The patient is a 70 year old female who we have been following for a  small splenic artery aneurysm.  She was last seen approximately 1 year  ago.  She denies any abdominal or back pain.  She has had no real  problems health wise over the past year.   CHRONIC MEDICAL PROBLEMS:  Include diabetes and elevated cholesterol  which are currently under control and followed by her primary care  doctor.   PHYSICAL EXAM:  Vital signs:  Blood pressure is 130/78 in the right arm,  heart rate 69 and regular.  Temperature is 98.  HEENT:  Unremarkable.  Chest:  Clear to auscultation.  Cardiac:  Exam is regular rate and  rhythm without murmur.  Abdomen:  Is soft, nontender, nondistended,  slightly obese.  She has 2+ femoral pulses bilaterally.   She had a CT angiogram of the abdomen and pelvis performed today.  This  shows that the splenic artery aneurysm is stable in size.  I measured it  at 12 mm today compared to 13 mm previously.  It is still well below the  2 cm threshold I would consider operative repair.   I believe the best option for the patient at this time since her artery  has been stable over the last year would be a repeat CT scan in about 2  years to recheck to make sure that the aneurysm is not growing over  time.  She will also let any emergency room know that if she has  abdominal pain she has a history of splenic artery aneurysm.  I did  reassure her today that the risk of rupture or complications related to  the splenic artery aneurysm of this size is quite low.     Janetta Hora. Fields, MD  Electronically Signed   CEF/MEDQ  D:  06/06/2009  T:  06/07/2009  Job:  [id number]   cc:   Tally Joe, M.D.

## 2010-07-23 NOTE — Assessment & Plan Note (Signed)
OFFICE VISIT   Catherine Decker, Catherine Decker  DOB:  1940-12-26                                       06/07/2008  CHART#:[id number]   Catherine Decker is a 70 year old female referred by Dr. Azucena Cecil for  evaluation of splenic artery aneurysm.  The patient was originally seen  by her chiropractor for back pain.  At that time, she was noted on plain  film to have calcification near the splenic artery.  This was followed  up with a CT scan of abdomen and pelvis which showed a calcified splenic  artery aneurysm 13 mm in diameter.  There was no other intra-abdominal  aneurysm.  Her primary risk factor is diabetes.  She denies history  smoking.  She has no history of hypertension.  She does have increased  triglycerides.  She denies history of coronary disease.  She has had no  significant abdominal pain.   PAST SURGICAL HISTORY:  She had bilateral tubal ligation and a  dilatation and curettage, and a right breast lumpectomy for cancer.   MEDICATIONS:  1. Levothyroxine 137 mcg once a day.  2. Fenofibrate 160 mg once a day.  3. Simvastatin 20 mg once a day.  4. Welchol 625 mg 6 daily at 3 a.m. and 3 p.m.  5. Omeprazole 20 mg once a day.  6. Metformin 500 mg once a day.  7. Fish Oil 1000 mg 2 times a day.   FAMILY HISTORY:  Unremarkable.   SOCIAL HISTORY:  She is married and has 3 children.  Nonsmoker,  nonconsumer of alcohol.   REVIEW OF SYSTEMS:  She is 5 feet 4, 173 pounds, and has been trying to  lose some weight recently.  Cardiac, pulmonary, GU, vascular,  neurologic, orthopedic psychiatric, ENT, and hematologic review of  systems are all negative.  GI:  She has a history of reflux and hiatal  hernia.   She has listed to ALLERGY TO DEMEROL which causes her heart to stop.   PHYSICAL EXAM:  Blood pressure 145/80 in the left arm.  Pulse is 78 and  regular.  HEENT is unremarkable.  Neck has 2+ carotid pulses without  bruit.  Chest:  Clear to auscultation.  Cardiac exam  is regular rate and  rhythm without murmur.  Abdomen is soft, obese, nontender, nondistended.  No masses.  Extremities:  She has 2+ brachial, radial, carotid, femoral,  dorsalis pedis, and posterior tibial pulses bilaterally.  She has a 2+  right popliteal pulse.  She has a 3+ left popliteal pulse which is  fairly full in character.  Feet are pink, warm, and well perfused.  She  has no lower extremity edema.   I reviewed her CT scan of the abdomen and pelvis which again shows a 13-  mm splenic artery aneurysm with no other visceral artery aneurysms and  no infrarenal or iliac artery aneurysm.  This is fairly heavily  calcified.   In light of the fullness of her popliteal pulse, we also did bilateral  popliteal ultrasounds today which showed normal popliteal arteries  bilaterally.   I reassured Catherine Decker today that the risk of rupture for a 13-mm  splenic artery aneurysm is fairly low.  If the aneurysm ever reaches the  size of 2 cm or greater, we would consider repair with most likely  embolization of this aneurysm at that  time.  I discussed this with her  today.  I believe the best option for her would be a follow-up CT scan  in 1 year.  If the aneurysm is fairly stable at that time, we may  consider going to more lengthy intervals on her CT scans at that point.   Janetta Hora. Fields, MD  Electronically Signed   CEF/MEDQ  D:  06/08/2008  T:  06/08/2008  Job:  [id number]   cc:   Tally Joe, M.D.

## 2010-09-30 ENCOUNTER — Other Ambulatory Visit: Payer: Self-pay | Admitting: Family Medicine

## 2010-09-30 DIAGNOSIS — Z1231 Encounter for screening mammogram for malignant neoplasm of breast: Secondary | ICD-10-CM

## 2010-10-08 ENCOUNTER — Ambulatory Visit
Admission: RE | Admit: 2010-10-08 | Discharge: 2010-10-08 | Disposition: A | Discharge status: Home / Self Care | Payer: Medicare Other | Source: Ambulatory Visit | Attending: Family Medicine | Admitting: Family Medicine

## 2010-10-08 DIAGNOSIS — Z1231 Encounter for screening mammogram for malignant neoplasm of breast: Secondary | ICD-10-CM

## 2011-06-04 ENCOUNTER — Encounter: Payer: Self-pay | Admitting: Neurosurgery

## 2011-06-04 ENCOUNTER — Encounter: Payer: Self-pay | Admitting: Vascular Surgery

## 2011-06-05 ENCOUNTER — Ambulatory Visit
Admission: RE | Admit: 2011-06-05 | Discharge: 2011-06-05 | Disposition: A | Discharge status: Home / Self Care | Payer: Medicare Other | Source: Ambulatory Visit | Attending: Vascular Surgery | Admitting: Vascular Surgery

## 2011-06-05 ENCOUNTER — Encounter: Payer: Self-pay | Admitting: Neurosurgery

## 2011-06-05 ENCOUNTER — Ambulatory Visit (INDEPENDENT_AMBULATORY_CARE_PROVIDER_SITE_OTHER): Payer: Medicare Other | Admitting: Neurosurgery

## 2011-06-05 VITALS — BP 145/76 | HR 76 | Resp 20 | Ht 63.0 in | Wt 171.0 lb

## 2011-06-05 DIAGNOSIS — I728 Aneurysm of other specified arteries: Secondary | ICD-10-CM

## 2011-06-05 IMAGING — CT CT CTA ABD/PEL W/CM AND/OR W/O CM
2 of 5 series · 12 of 36 positions shown, 18 images · IV contrast ([ID] OMNI 350)
Comparison: Combination of both CTA and CT studies dated
[DATE], [DATE] and [DATE].

CLINICAL DATA: Follow-up of splenic artery aneurysm.  History of
cirrhosis per prior CT report in [DATE].

CT ANGIOGRAPHY ABDOMEN AND PELVIS
TECHNIQUE: Multidetector CT imaging of the abdomen and pelvis was
performed using the standard protocol during bolus administration
of intravenous contrast.  Multiplanar reconstructed images
including MIPs were obtained and reviewed to evaluate the vascular
anatomy.
Contrast: 100mL OMNIPAQUE IOHEXOL 350 MG/ML IV SOLN

[Series 5: angio · axial · 0.79mm/px · z∈[-384,-2]mm · 11 of 179 slices shown, 16 images]
[im 13/179  soft-tissue]
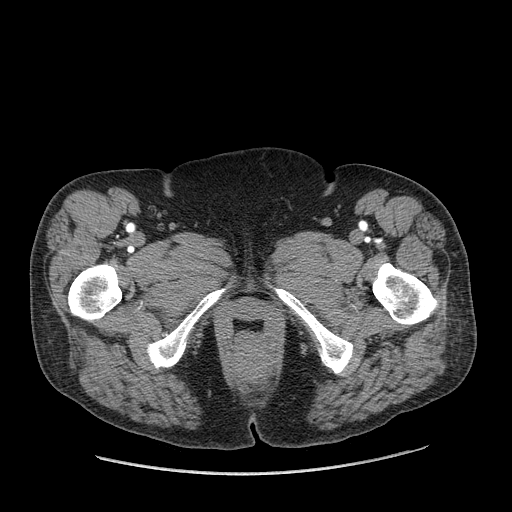
[im 13/179  bone]
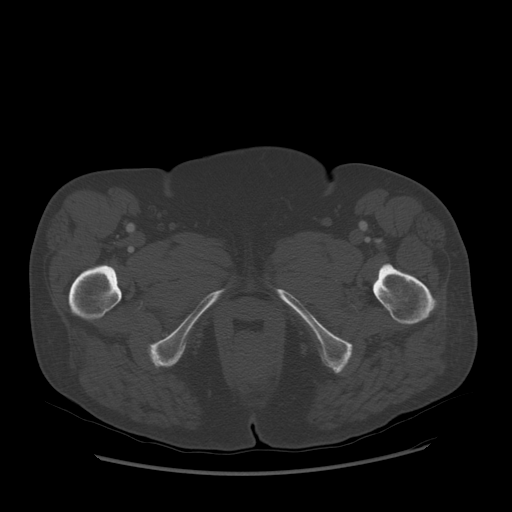
[im 26/179  soft-tissue]
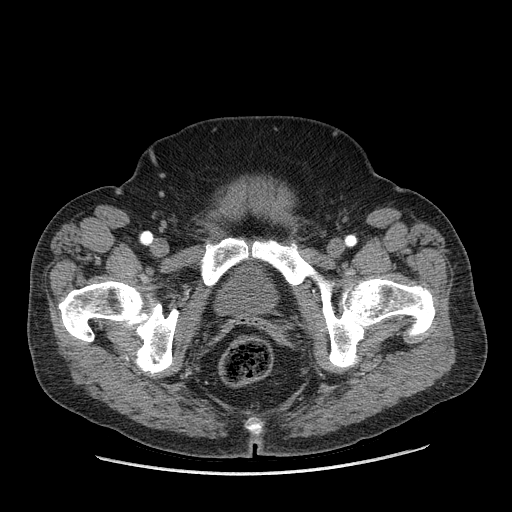
[im 51/179  soft-tissue]
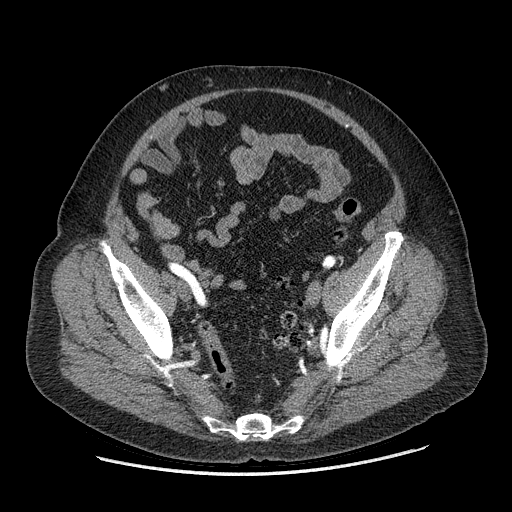
[im 64/179  soft-tissue]
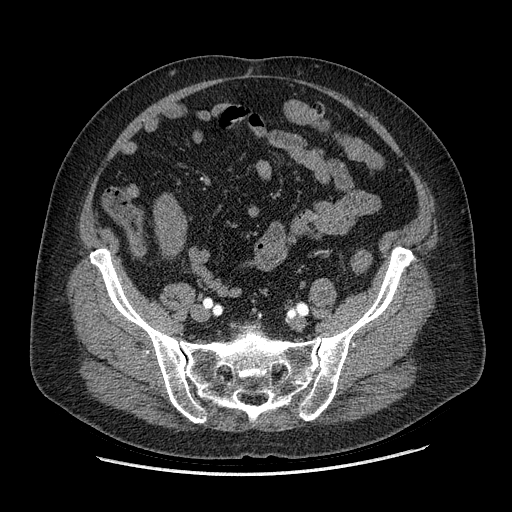
[im 77/179  soft-tissue]
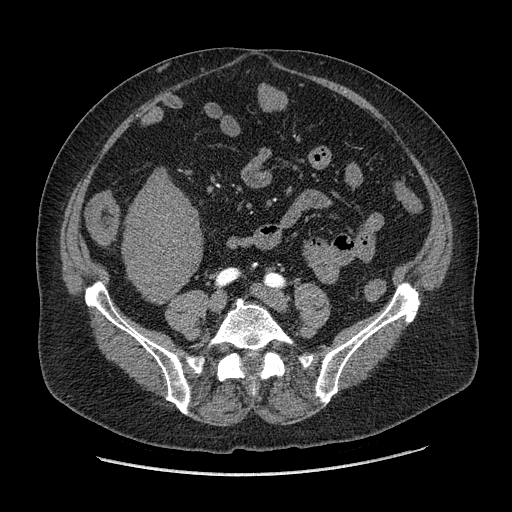
[im 102/179  soft-tissue]
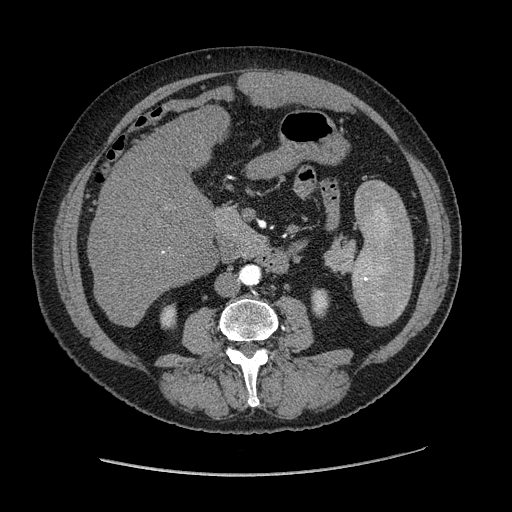
[im 115/179  soft-tissue]
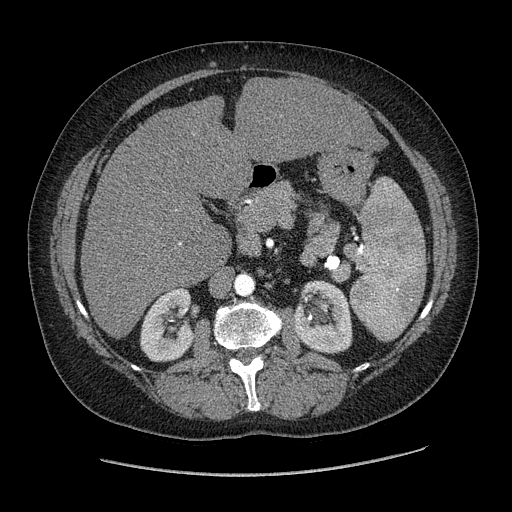
[im 128/179  soft-tissue]
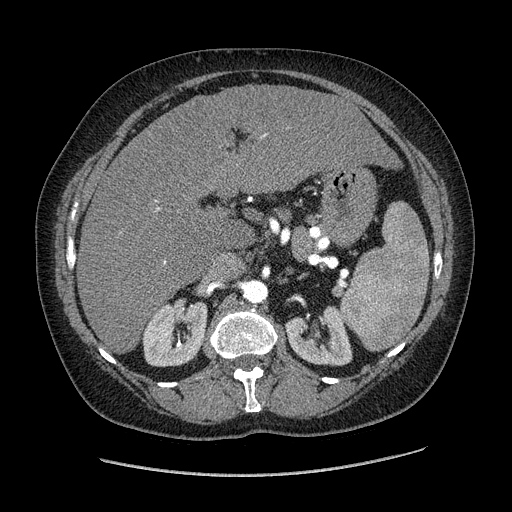
[im 128/179  lung]
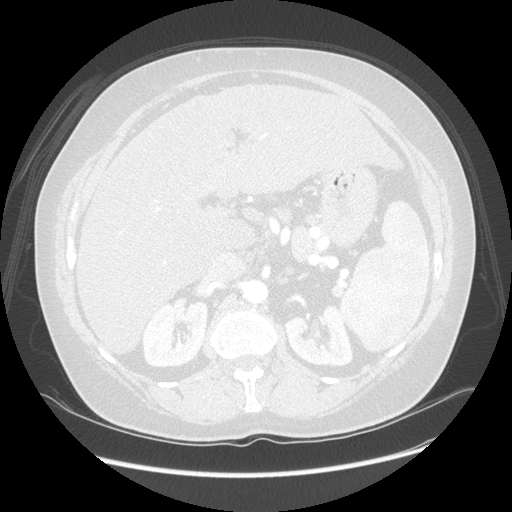
[im 140/179  lung]
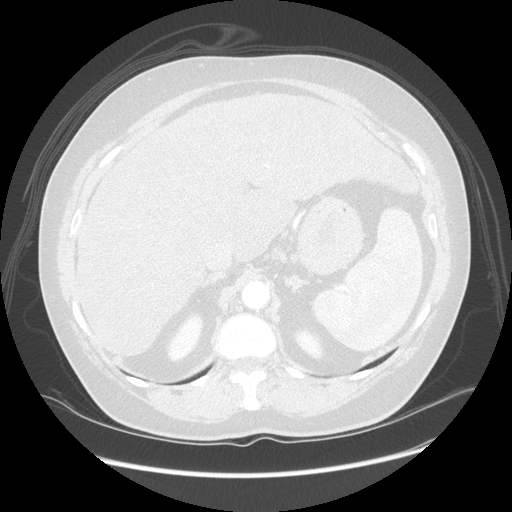
[im 153/179  soft-tissue]
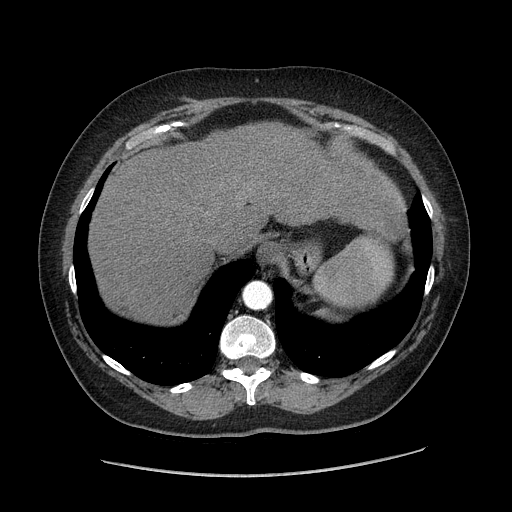
[im 153/179  lung]
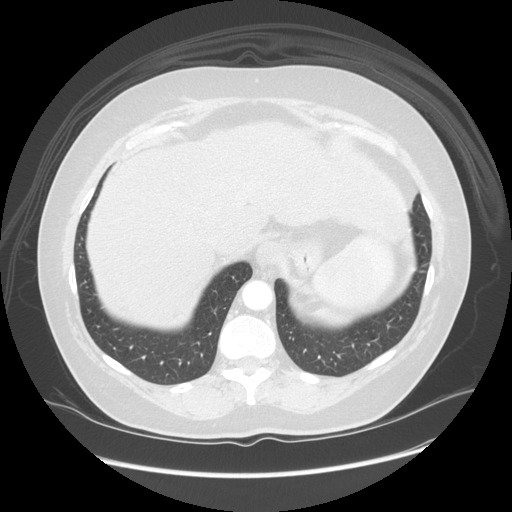
[im 153/179  bone]
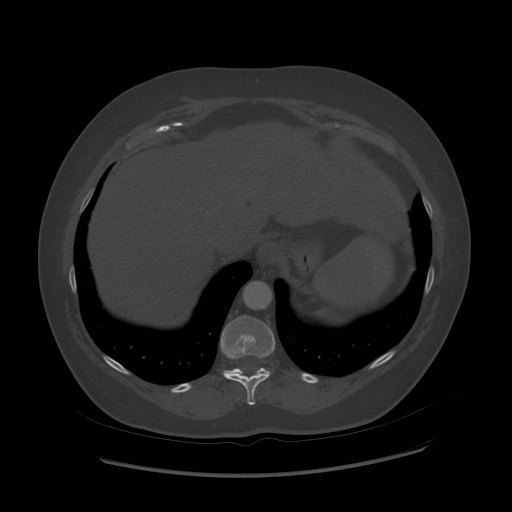
[im 166/179  soft-tissue]
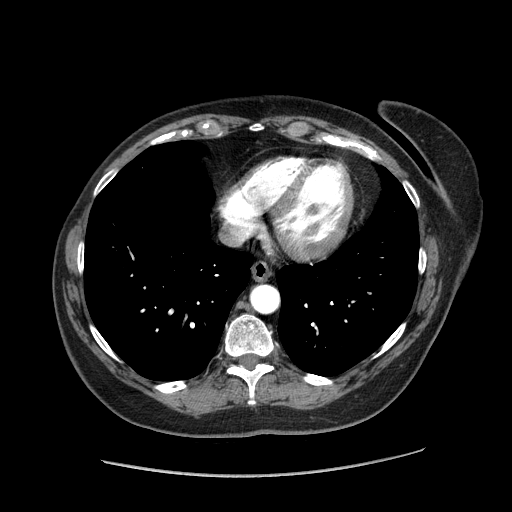
[im 166/179  lung]
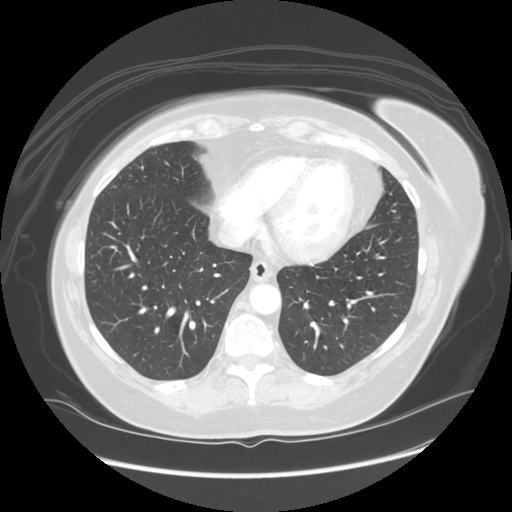

[Series 601: coronal body · coronal · 0.87mm/px · 1 of 139 slices shown, 2 images]
[im 47/139  soft-tissue]
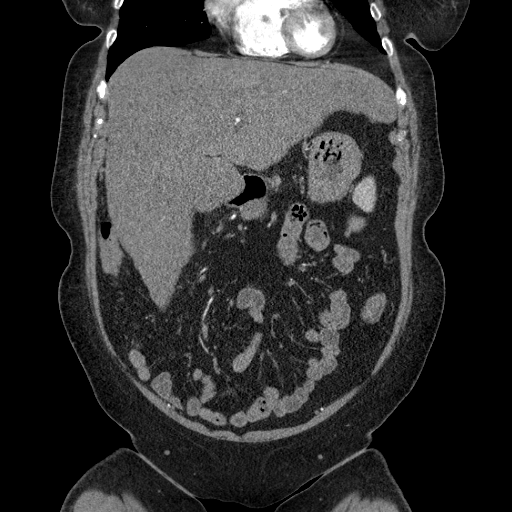
[im 47/139  bone]
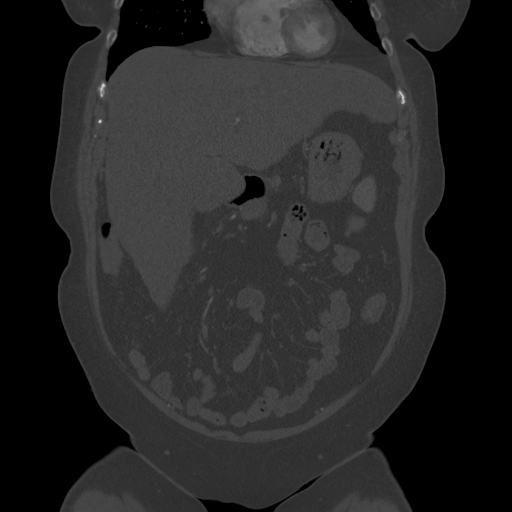

[12 of 36 positions shown; findings below may reference images not displayed]

FINDINGS: Rim calcified splenic artery aneurysm emanating along
the inferior surface of the distal third of the splenic artery
shows stable size and morphology since prior imaging.  This
aneurysm again demonstrates a heavily calcified rim and is
partially patent internally.  Dimensions currently are
approximately 12 x 13 x 13 mm.  This is completely stable since
imaging dating back to [SP].

There is additional splenic artery calcification involving a
superior branch just before it enters the substance of the spleen.
This focal area measures approximately 5 mm and is stable in
appearance compared to prior imaging.  There is no evidence of
aneurysm leak.  Perfusion of the spleen is well preserved.

Since prior imaging, the spleen has progressively enlarged,
especially since [SP].  Current dimensions of the spleen are
approximately 8.0 x 15.9 x 13.0 cm.  Estimated splenic volume is
860 ml.  There are scattered calcified granulomata in the substance
of the spleen.  Splenic enlargement likely relates to cirrhosis and
portal hypertension.  Correlation suggested with liver function
tests and CBC.

No ascites or obvious varices are seen by CT.  There are some
stable periportal lymph nodes present which are likely reactive.
The liver again shows a nodular surface contour, overall
enlargement and left lobe enlargement consistent with underlying
cirrhosis.  No obvious hepatic masses or enhancing hepatic nodules
are identified.  The abdominal aorta and its visceral branches show
stable patency without evidence of significant obstructive disease
or aortic aneurysm.  Iliac and femoral arteries show stable and
normal patency bilaterally.  Stable diverticulosis noted of the
colon.

 Review of the MIP images confirms the above findings.
IMPRESSION: 1.  Stable 13 mm main splenic artery aneurysm.  Additional 5 mm
focal arterial calcification likely represents a small branch
vessel aneurysm supplying the superior spleen and is also stable.
2.  Progressive splenomegaly, especially since [SP] with estimated
splenic volume of 860 ml.  This likely relates to underlying portal
hypertension given apparent clinical history of cirrhosis.  The
liver also again demonstrates evidence of hepatomegaly and surface
contour nodularity consistent with cirrhosis.  Given progressive
splenomegaly, recommend correlation with liver function and
platelet count.  There are no perfusion abnormalities of the
spleen.  No obvious varices or ascites.

## 2011-06-05 MED ORDER — IOHEXOL 350 MG/ML SOLN
100.0000 mL | Freq: Once | INTRAVENOUS | Status: AC | PRN
  Administered 2011-06-05: 100 mL via INTRAVENOUS

## 2011-06-05 NOTE — Progress Notes (Signed)
Subjective:     Patient ID: Catherine Decker, female   DOB: 1940/07/17, 71 y.o.   MRN: [id number]  HPI: 71 year old patient of Dr. Oneida Alar seen for 2 year followup of splenic artery aneurysm. The patient has no new complaints she denies abdominal pain. She has no disruption of ADLs   Review of Systems: 12 point review of systems is notable for the difficulties described above otherwise unremarkable     Objective:   Physical Exam: Well-developed well-nourished 71 year old female seen for splenic artery aneurysm followup. Vital signs are stable, 2+ radial pulses bilaterally 1+ dorsalis pedis bilaterally minimal posterior tibial bilaterally. Denies abdominal pain, abdomen soft nondistended no palpable pulsation.     Assessment:     She had a CT angiogram of the abdomen and pelvis performed today. This  shows that the splenic artery aneurysm is stable in size. I with Dr. Oneida Alar measured it  at 12 mm today compared to 13 mm previously. It is still well below the  2 cm threshold that Dr. Oneida Alar would consider operative repair.       Plan:   .  Dr. Oneida Alar believes the best option for the patient at this time since her artery  has been stable over the last year would be a repeat CT scan in about 2  years to recheck to make sure that the aneurysm is not growing over  time. She will also let any emergency room know that if she has  abdominal pain she has a history of splenic artery aneurysm. I did  reassure her today that the risk of rupture or complications related to  the splenic artery aneurysm of this size is quite low.  [facility] M.D.: Fields

## 2011-12-09 ENCOUNTER — Other Ambulatory Visit: Payer: Self-pay | Admitting: Family Medicine

## 2011-12-09 DIAGNOSIS — Z853 Personal history of malignant neoplasm of breast: Secondary | ICD-10-CM

## 2011-12-09 DIAGNOSIS — Z9889 Other specified postprocedural states: Secondary | ICD-10-CM

## 2011-12-09 DIAGNOSIS — Z1231 Encounter for screening mammogram for malignant neoplasm of breast: Secondary | ICD-10-CM

## 2011-12-15 ENCOUNTER — Ambulatory Visit
Admission: RE | Admit: 2011-12-15 | Discharge: 2011-12-15 | Disposition: A | Discharge status: Home / Self Care | Payer: Medicare Other | Source: Ambulatory Visit | Attending: Family Medicine | Admitting: Family Medicine

## 2011-12-15 DIAGNOSIS — Z853 Personal history of malignant neoplasm of breast: Secondary | ICD-10-CM

## 2011-12-15 DIAGNOSIS — Z9889 Other specified postprocedural states: Secondary | ICD-10-CM

## 2011-12-15 DIAGNOSIS — Z1231 Encounter for screening mammogram for malignant neoplasm of breast: Secondary | ICD-10-CM

## 2011-12-16 IMAGING — MG MM DIGITAL SCREENING BILAT
8 series · 8 of 24 positions shown · non-contrast
Comparison: Previous exams.

CLINICAL DATA: Screening.

DIGITAL BILATERAL SCREENING MAMMOGRAM WITH CAD
DIGITAL BREAST TOMOSYNTHESIS
Digital breast tomosynthesis images are acquired in two
projections.  These images are reviewed in combination with the
digital mammogram, confirming the findings below.

[L MLO]
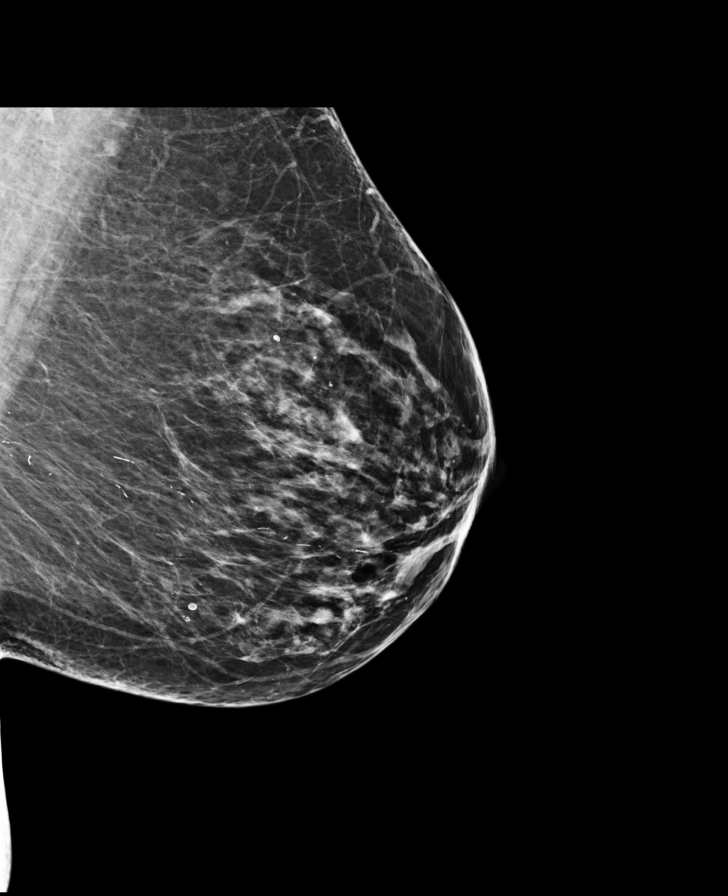

[R MLO]
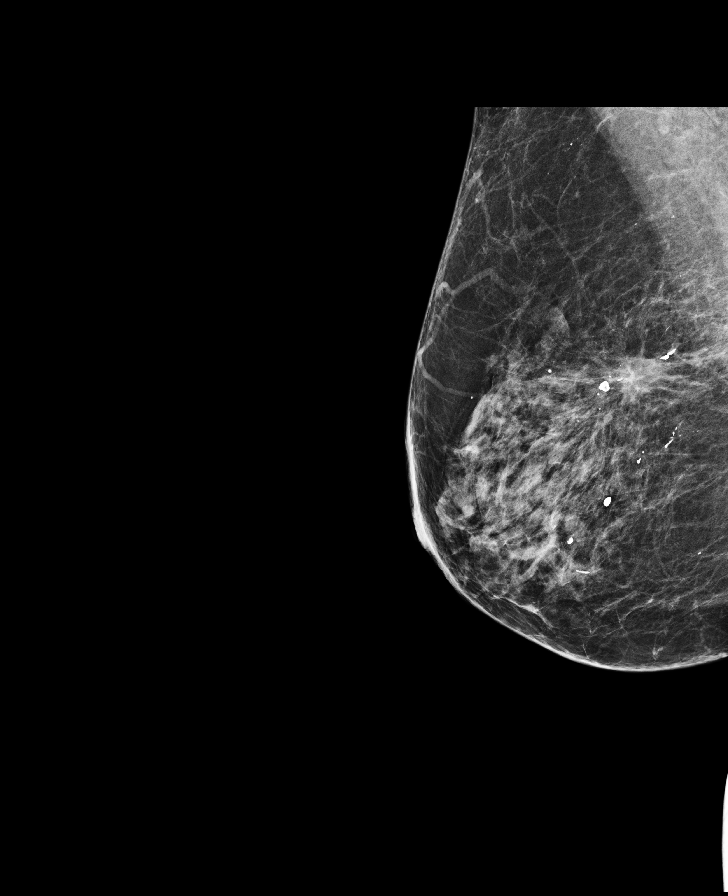

[L CC]
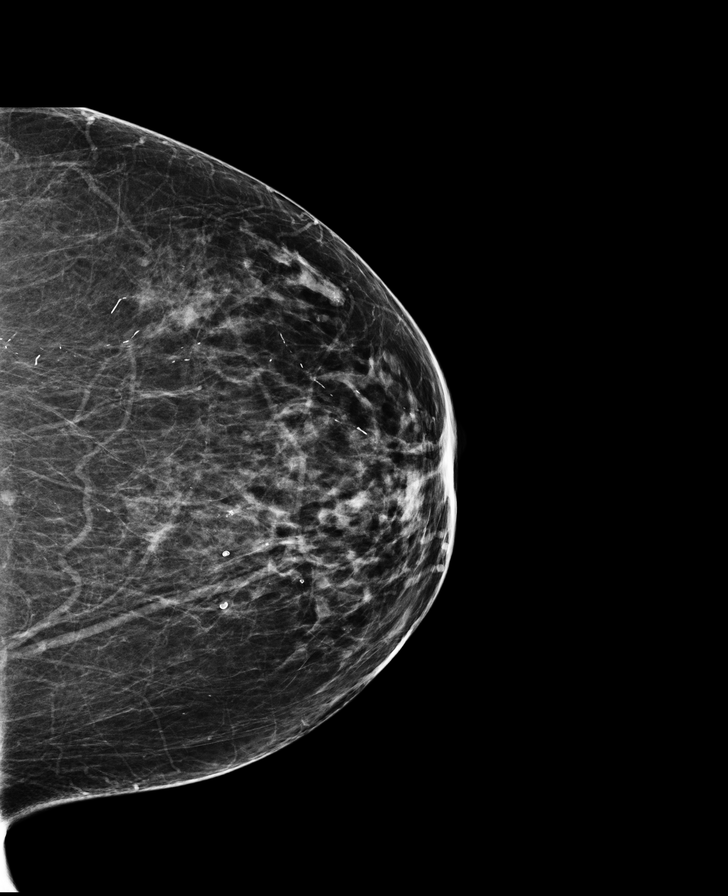

[R CC]
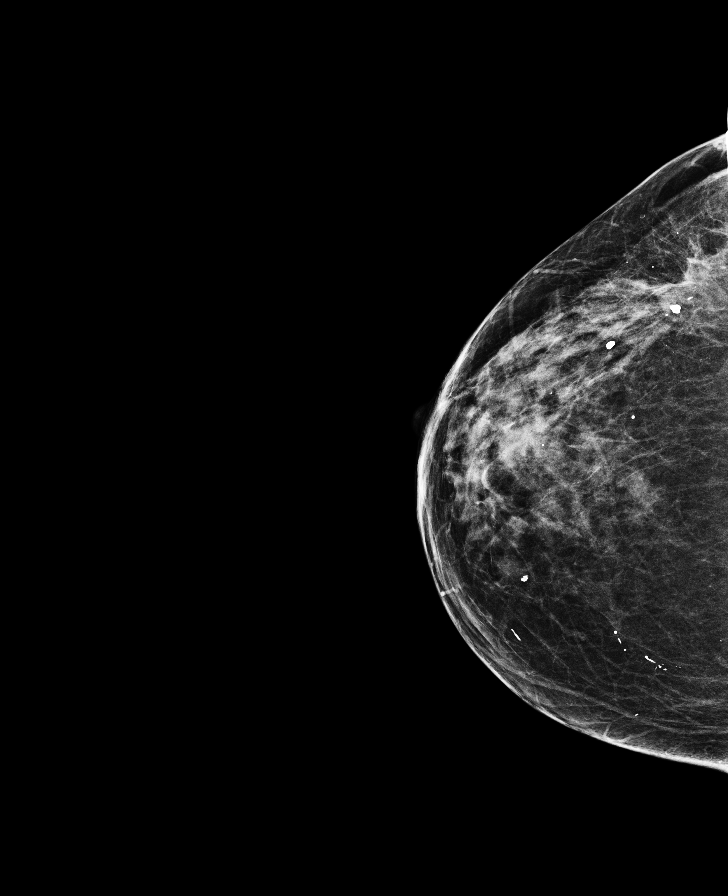

[RCC COMBO BREAST TOMOSYNTHESIS IMAGE tomo · tomo slice 27/54.0]
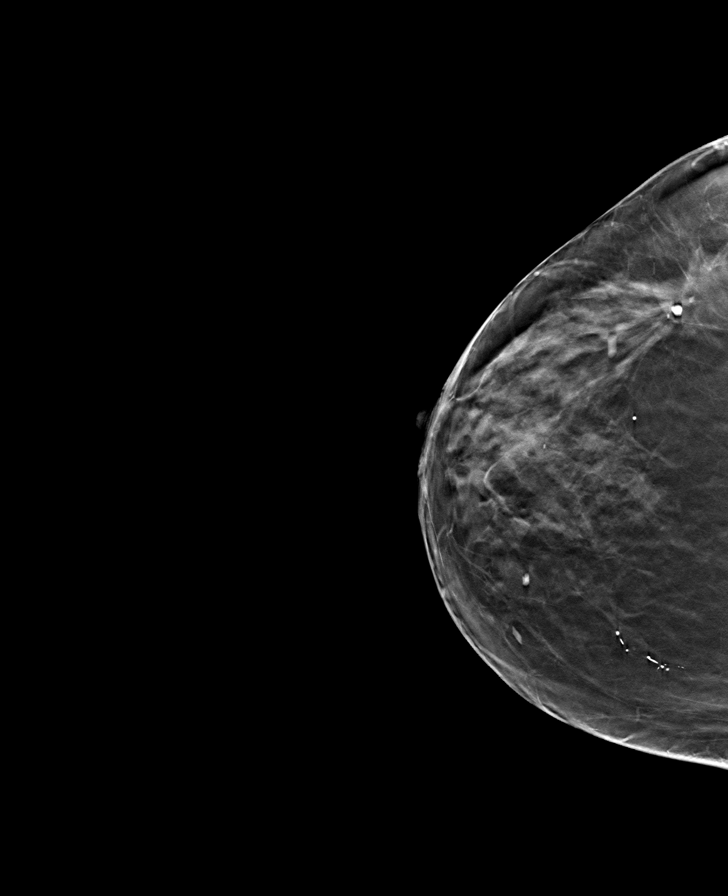

[LMLO COMBO BREAST TOMOSYNTHESIS IMAGE tomo · tomo slice 35/68.0]
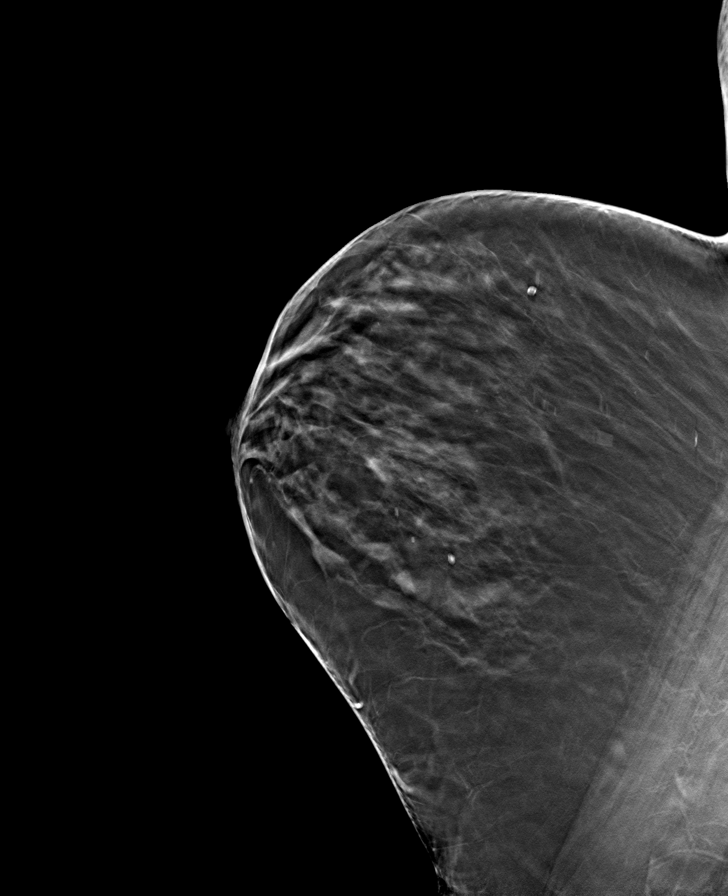

[LCC COMBO BREAST TOMOSYNTHESIS IMAGE tomo · tomo slice 32/63.0]
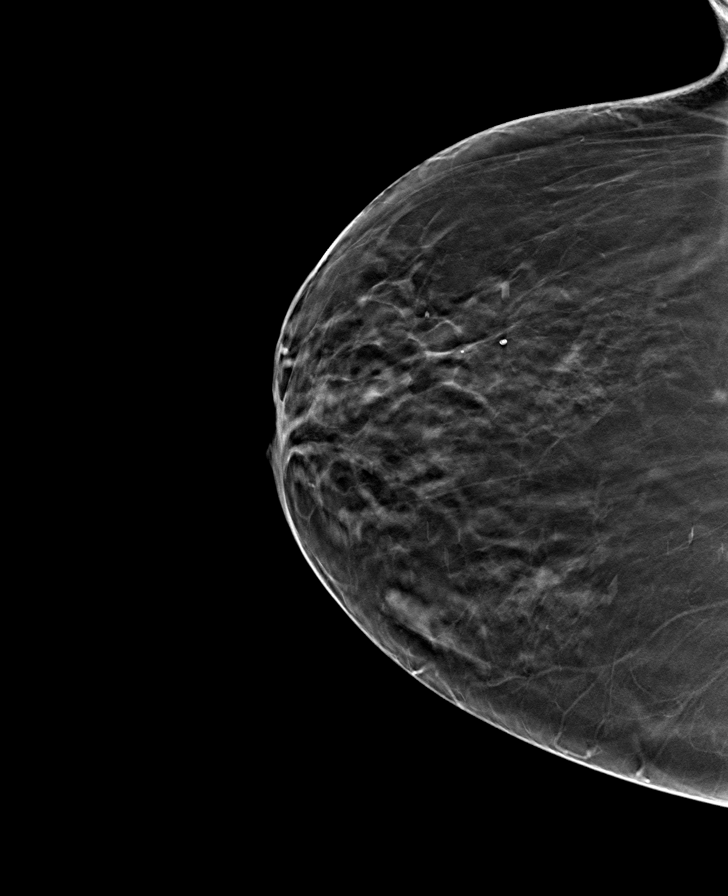

[RMLO COMBO BREAST TOMOSYNTHESIS IMAGE tomo · tomo slice 31/61.0]
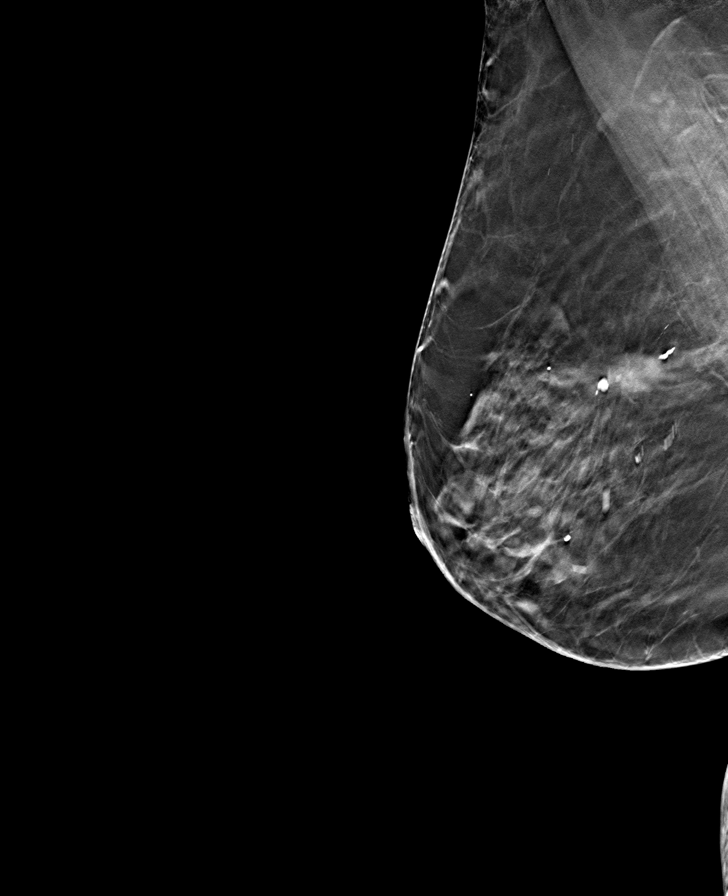

[8 of 24 positions shown; findings below may reference images not displayed]

FINDINGS: The breast tissue is heterogeneously dense. No
suspicious masses, architectural distortion, or calcifications are
present.

Images were processed with CAD.
IMPRESSION: No mammographic evidence of malignancy.

A result letter of this screening mammogram will be mailed directly
to the patient.

RECOMMENDATION:
Screening mammogram in one year. (Code:[BL])

BI-RADS CATEGORY 1:  Negative.

## 2012-07-01 ENCOUNTER — Other Ambulatory Visit: Payer: Self-pay

## 2012-07-01 DIAGNOSIS — I728 Aneurysm of other specified arteries: Secondary | ICD-10-CM

## 2012-12-28 ENCOUNTER — Other Ambulatory Visit: Payer: Self-pay

## 2012-12-28 DIAGNOSIS — Z1231 Encounter for screening mammogram for malignant neoplasm of breast: Secondary | ICD-10-CM

## 2013-01-24 ENCOUNTER — Ambulatory Visit
Admission: RE | Admit: 2013-01-24 | Discharge: 2013-01-24 | Disposition: A | Discharge status: Home / Self Care | Payer: Medicare Other | Source: Ambulatory Visit

## 2013-01-24 DIAGNOSIS — Z1231 Encounter for screening mammogram for malignant neoplasm of breast: Secondary | ICD-10-CM

## 2013-06-01 ENCOUNTER — Other Ambulatory Visit: Payer: Self-pay | Admitting: Radiology

## 2013-06-01 ENCOUNTER — Encounter: Payer: Self-pay | Admitting: Vascular Surgery

## 2013-06-01 DIAGNOSIS — I714 Abdominal aortic aneurysm, without rupture, unspecified: Secondary | ICD-10-CM

## 2013-06-01 LAB — CREATININE WITH EST GFR
Creat: 0.85 mg/dL (ref 0.50–1.10)
GFR, Est African American: 80 mL/min
GFR, Est Non African American: 69 mL/min

## 2013-06-01 LAB — BUN: BUN: 14 mg/dL (ref 6–23)

## 2013-06-02 ENCOUNTER — Ambulatory Visit
Admission: RE | Admit: 2013-06-02 | Discharge: 2013-06-02 | Disposition: A | Discharge status: Home / Self Care | Payer: Medicare Other | Source: Ambulatory Visit | Attending: Vascular Surgery | Admitting: Vascular Surgery

## 2013-06-02 ENCOUNTER — Encounter: Payer: Self-pay | Admitting: Vascular Surgery

## 2013-06-02 ENCOUNTER — Ambulatory Visit (INDEPENDENT_AMBULATORY_CARE_PROVIDER_SITE_OTHER): Payer: Medicare Other | Admitting: Vascular Surgery

## 2013-06-02 VITALS — BP 123/53 | HR 75 | Ht 63.0 in | Wt 167.2 lb

## 2013-06-02 DIAGNOSIS — I728 Aneurysm of other specified arteries: Secondary | ICD-10-CM

## 2013-06-02 IMAGING — CT CT CTA ABD/PEL W/CM AND/OR W/O CM
4 of 10 series · 12 of 36 positions shown, 16 images · IV contrast (omnipaque)
Comparison: CT CTA ABD/PEL W/CM AND/OR W/O CM dated [DATE];

CLINICAL DATA: Evaluate splenic artery aneurysm, remote history of
breast cancer

EXAM:
CT ANGIOGRAPHY ABDOMEN AND PELVIS WITH CONTRAST AND WITHOUT CONTRAST
TECHNIQUE: Multidetector CT imaging of the abdomen and pelvis was performed
using the standard protocol during bolus administration of
intravenous contrast. Multiplanar reconstructed images including
MIPs were obtained and reviewed to evaluate the vascular anatomy.
CONTRAST:  80mL OMNIPAQUE IOHEXOL 350 MG/ML SOLN

[Series 5: arterial (id) · axial · arterial · 0.76mm/px · z∈[-390,-118]mm · 4 of 183 slices shown]
[im 37/183  soft-tissue]
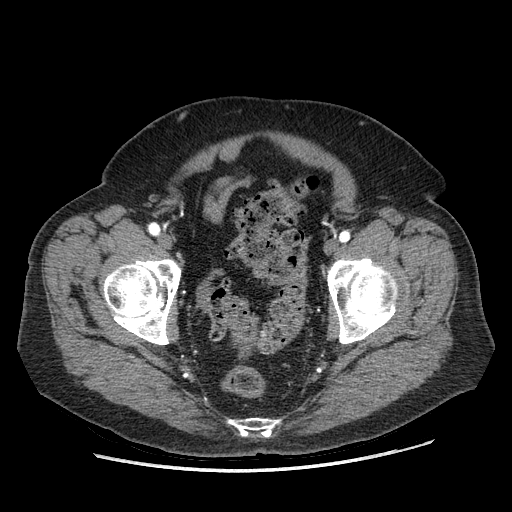
[im 73/183  soft-tissue]
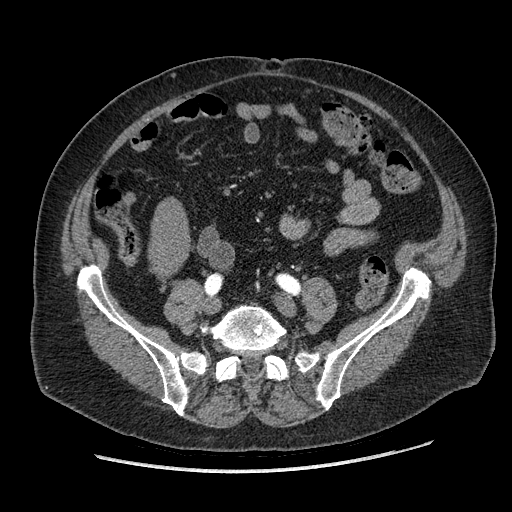
[im 110/183  soft-tissue]
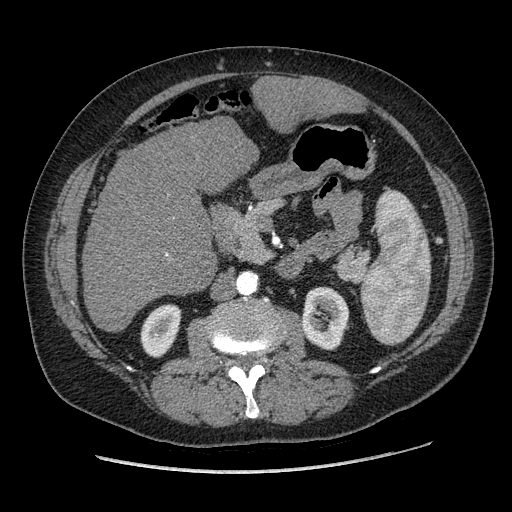
[im 146/183  soft-tissue]
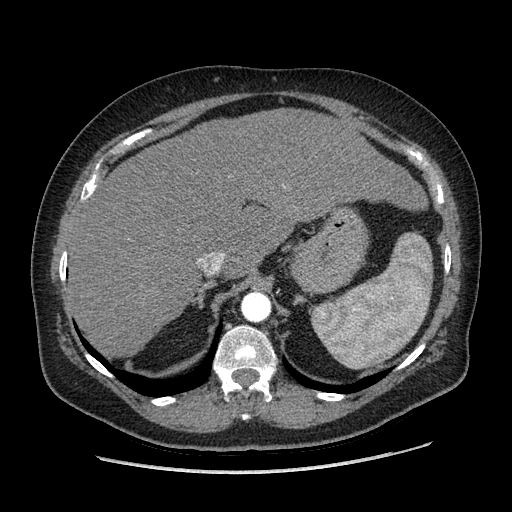

[Series 8: portal venous 5mm · axial · portal-venous · 0.76mm/px · z∈[-480,-25]mm · 3 of 92 slices shown, 7 images]
[im 1/92  soft-tissue]
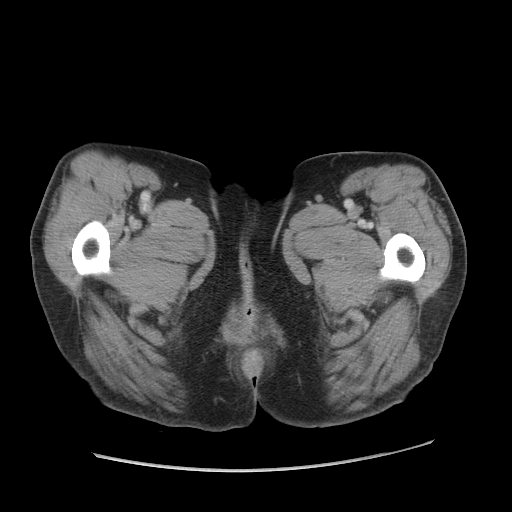
[im 1/92  lung]
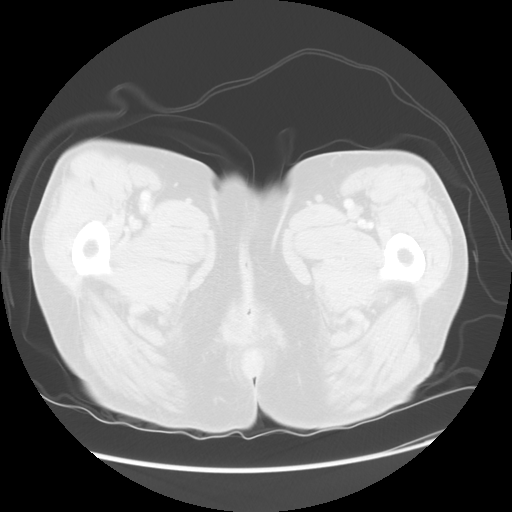
[im 1/92  bone]
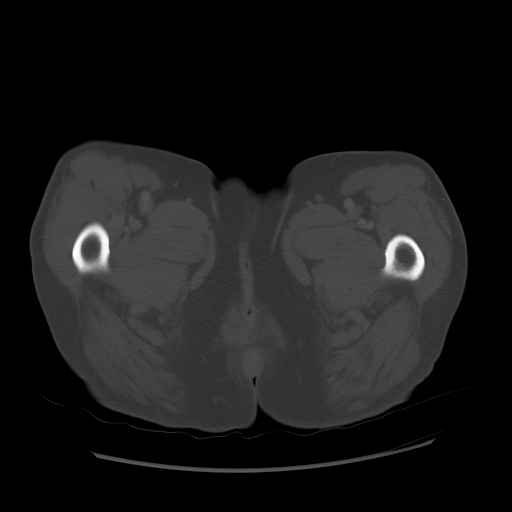
[im 46/92  soft-tissue]
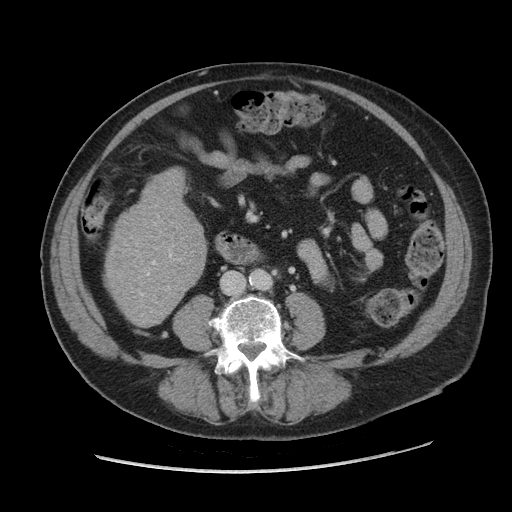
[im 46/92  lung]
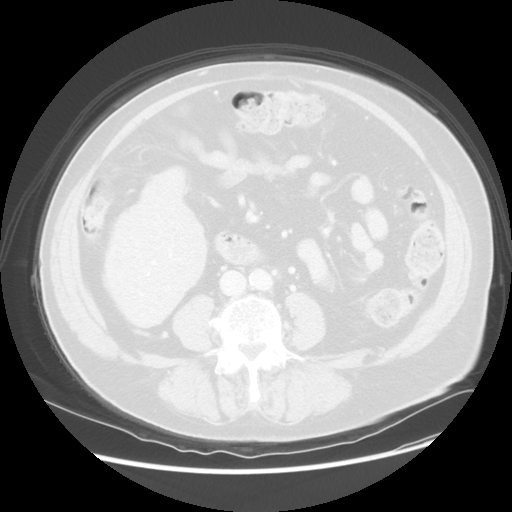
[im 92/92  soft-tissue]
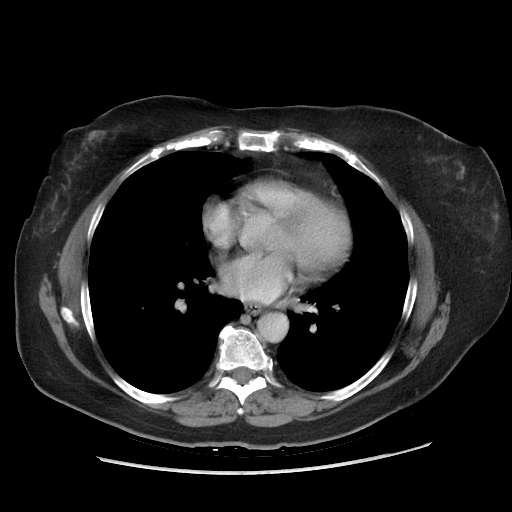
[im 92/92  lung]
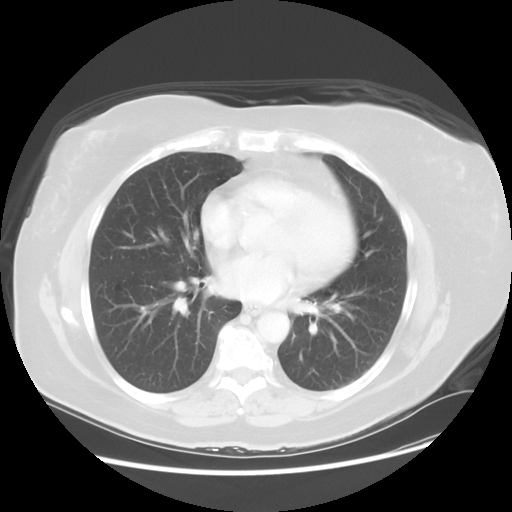

[Series 602: sagittal body · sagittal · 0.89mm/px · 3 of 154 slices shown (1 of 2)]
[im 39/154  soft-tissue]
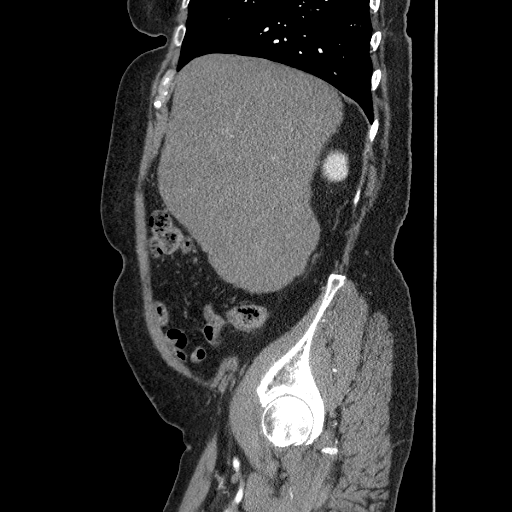
[im 77/154  soft-tissue]
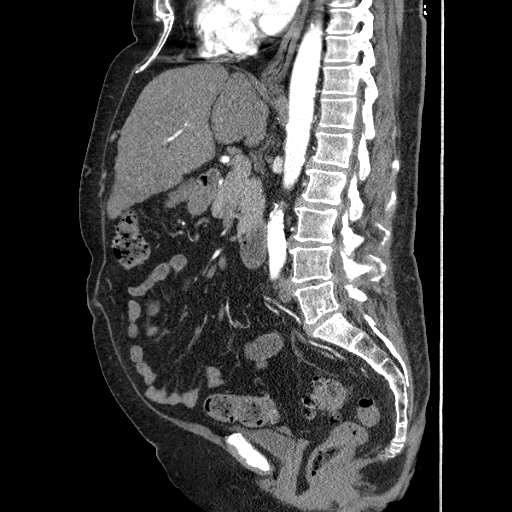
[im 115/154  soft-tissue]
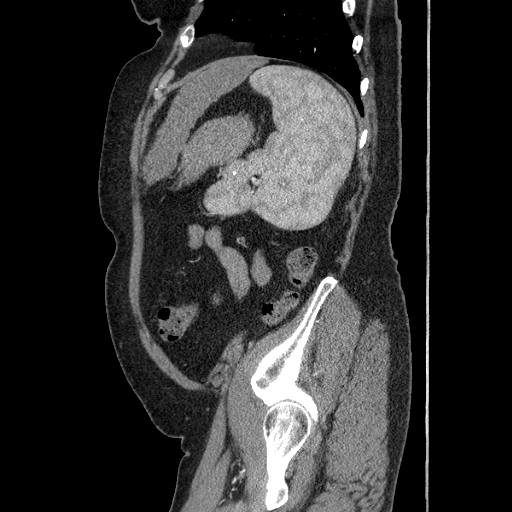

[Series 607: sagittal body · sagittal · 0.89mm/px · 2 of 153 slices shown (2 of 2)]
[im 39/153  soft-tissue]
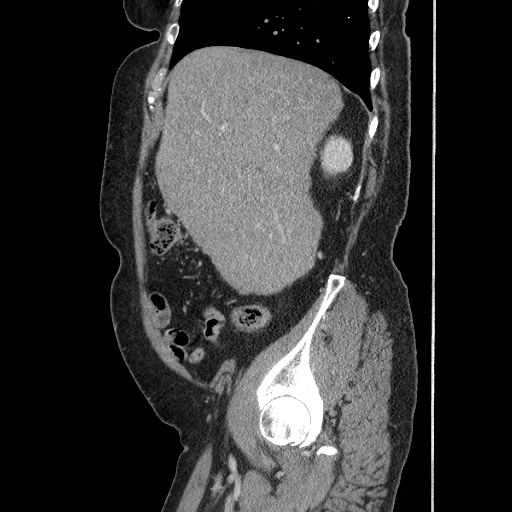
[im 77/153  soft-tissue]
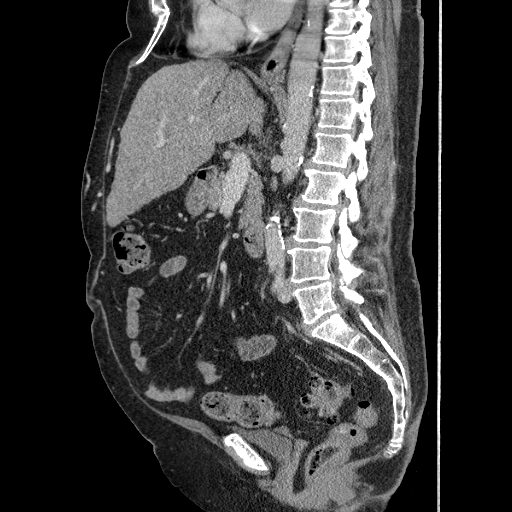

[12 of 36 positions shown; findings below may reference images not displayed]

CT
ABD WO/W CM dated [DATE]; CT ANGIO ABDOMEN W/CM &/OR WO/CM
dated [DATE]; CT ABD W/CM dated [DATE]
FINDINGS: Vascular Findings:

Abdominal aorta: Scattered atherosclerotic plaque within a normal
caliber abdominal aorta, not resulting in hemodynamically
significant stenosis. No abdominal aortic dissection or periaortic
stranding.

Celiac artery: Widely patent, conventional branching pattern.

Splenic artery: The known approximately 1.5 x 1.2 cm densely
calcified aneurysm arising from the mid aspect of the main splenic
artery (coronal image 72, series 601) grossly unchanged since the
[DATE] examination. The approximately 6 mm densely calcified
arterial calcification likely represents a small branch vessel
aneurysm supplying the superior pole of the spleen (coronal image
79, series 601) and is also unchanged since the [IF] examination.

SMA: Widely patent without a clinically significant narrowing.
Conventional branching pattern.

Right Renal artery: Solitary; there is a minimal amount of mixed
calcified and noncalcified atherosclerotic plaque involving the
origin of the right renal artery, not resulting in hemodynamically
significant stenosis.

Left Renal artery: Solitary; there is a minimal amount of mixed
calcified and noncalcified atherosclerotic plaque involving the
origin the left renal artery, not result in hemodynamically
significant stenosis.

IMA: Widely patent

Pelvic vasculature: The bilateral common, external and internal
iliac arteries are of normal caliber and widely patent without
hemodynamically significant narrowing.

Review of the MIP images confirms the above findings.

--------------------------------------------------------------------------------

Nonvascular Findings:

There is grossly unchanged nodularity of the hepatic contour or
suggestive of cirrhosis. No discrete hepatic lesions. Post
cholecystectomy. No intra or extrahepatic biliary duct dilatation.
No ascites. Suspected progressive splenomegaly with the spleen now
measuring approximately 15.1 x 7.3 x 8.4 cm (coronal image 82,
series 601; axial image 45, series 5) previously, 15.9 x 6.4 x
cm. There is re- cannulization of the periumbilical veins. The
portal vein remains widely patent.

There is symmetric enhancement and excretion of the bilateral
kidneys. No definite renal stones on this postcontrast examination.
Several parapelvic cysts are again suspected within the caudal
aspect of the left kidney. No urinary obstruction or perinephric
stranding. There is unchanged nodularity of the crux of the left
adrenal gland (image 45, series 5, too small to actually
characterize. Normal appearance of the right adrenal gland and
pancreas. Scattered calcifications within the spleen, the sequela of
prior granulomatous infection.

Extensive colonic diverticulosis without evidence of diverticulitis.
Moderate colonic stool burden without evidence of enteric
obstruction. Normal appearance of the appendix. No pneumoperitoneum,
pneumatosis or portal venous gas.

Scattered shotty retroperitoneal and porta hepatis lymph nodes are
individually not enlarged by size criteria with index Port hepatis
lymph node measuring 0.8 cm in greatest short axis diameter (image
63, series 5). No retroperitoneal, mesenteric, pelvic or inguinal
lymphadenopathy.

Post hysterectomy. No discrete adnexal lesion. No free fluid within
the pelvis.

Limited visualization of the lower thorax demonstrates unchanged
granulomas within the imaged right lung base with dominant granuloma
measuring approximately 0.6 cm (image 4, series 10). There is
minimal dependent subpleural ground-glass atelectasis. No discrete
focal airspace opacities. No pleural effusion. Normal heart size. No
pericardial effusion.

No acute or aggressive osseus abnormalities. Mild to moderate
multilevel lumbar spine DDD, worse at L4-L5 and L5-S1 with disc
space height loss, endplate irregularity and sclerosis. Regional
soft tissues appear normal.
IMPRESSION: 1. Unchanged approximately 1.5 cm main splenic artery aneurysm and
additional suspected 6 mm aneurysm involving a small branch of the
splenic artery supplying the superior pole of the spleen. Both of
these aneurysms are stable since the [IF] examination.
2. Sequela of prior granulomatous infection.
3. Extensive colonic diverticulosis without evidence of
diverticulitis.
4. Similar findings of cirrhosis with progressive splenomegaly and
findings suggestive of portal venous hypertension. No ascites. The
portal vein remains patent. No discrete hepatic lesions.

## 2013-06-02 MED ORDER — IOHEXOL 350 MG/ML SOLN
80.0000 mL | Freq: Once | INTRAVENOUS | Status: AC | PRN
  Administered 2013-06-02: 80 mL via INTRAVENOUS

## 2013-06-02 NOTE — Progress Notes (Signed)
Patient is a 73 year old female with known splenic artery aneurysm. This is been known since 2010. She returns today for further followup. She denies any significant abdominal pain. She does have a history of cirrhosis as well. This is thought to be due to prior infectious hepatitis. The splenic artery aneurysm was 1.5 cm at her last office visit a year ago.  Review of systems: She denies any upper GI bleed symptoms. She denies any significant ascites. She denies any shortness of breath or chest pain.  Physical exam:  Filed Vitals:   06/02/13 1232  BP: 123/53  Pulse: 75  Height: 5\' 3"  (1.6 m)  Weight: 167 lb 3.2 oz (75.841 kg)  SpO2: 96%    Abdomen: Soft nontender nondistended no obvious mass  Extremities: 2+ femoral pulses bilaterally  Data: CT scan of abdomen and pelvis is reviewed today. This shows progressive changes of cirrhosis with large numbers of varices and splenomegaly, the splenic artery aneurysm is unchanged at 1.5 cm diameter  Assessment: Stable 1.5 cm splenic artery aneurysm no significant change over the last 5 years. Progressive changes of hepatic cirrhosis with splenomegaly and variceal formation.  Plan: Patient is a followup CT scan and evaluation in approximately 5 years to check stability of the splenic artery aneurysm. Her cirrhosis is being followed by her primary care physician.  Ruta Hinds, MD [facility] Office: [phone] Pager: [phone]

## 2013-06-30 ENCOUNTER — Ambulatory Visit: Payer: Medicare Other | Admitting: Vascular Surgery

## 2013-08-30 ENCOUNTER — Encounter: Payer: Self-pay | Admitting: *Deleted

## 2013-08-30 ENCOUNTER — Encounter: Payer: Medicare Other | Attending: Family Medicine | Admitting: *Deleted

## 2013-08-30 VITALS — Ht 63.0 in | Wt 167.2 lb

## 2013-08-30 DIAGNOSIS — Z713 Dietary counseling and surveillance: Secondary | ICD-10-CM | POA: Insufficient documentation

## 2013-08-30 DIAGNOSIS — E119 Type 2 diabetes mellitus without complications: Secondary | ICD-10-CM | POA: Diagnosis present

## 2013-08-30 NOTE — Progress Notes (Signed)
Appt start time: 0730 end time:  0900.  Assessment:  Patient was seen on  08/30/13 for individual diabetes education. She lives with her husband and 72 year old grandson. History of diabetes for about 3 years. SMBG every AM with reported range of 150-180 mg/dl and more as needed. 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   Current HbA1c: 8.8%  Preferred Learning Style:   No preference indicated   Learning Readiness:   Ready  Change in progress  MEDICATIONS: see list, diabetes medications are Metformin and Tradjenta  DIETARY INTAKE:  24-hr recall:  B ( AM): skips often OR   Snk ( AM): none  L ( PM): sandwich OR salad from home with fresh fruit, water OR SF lemonade Snk ( PM): nuts OR slice of cake or cookie from work D ( PM): small portion of meat, starch, vegetables, usually bread, diet soda or Crystal Light Tea, or water Snk ( PM): 3 scoops ice cream Beverages: diet soda or Crystal Light Tea, or water   Usual physical activity: active at work and has started walking after dinner with her grandson  Estimated energy needs: 1400 calories 158 g carbohydrates 105 g protein 39 g fat  Progress Towards Goal(s):  In progress.   Nutritional Diagnosis:  NB-1.1 Food and nutrition-related knowledge deficit As related to diabetes.  As evidenced by A1c of 8.8%.    Intervention:  Nutrition counseling provided.  Discussed diabetes disease process and treatment options.  Discussed physiology of diabetes and role of obesity on insulin resistance.  Encouraged moderate weight reduction to improve glucose levels.    Provided education on macronutrients on glucose levels.  Provided education on carb counting, importance of regularly scheduled meals/snacks, and meal planning  Discussed effects of physical activity on glucose levels and long-term glucose control.  Recommended 150 minutes of physical activity/week.  Reviewed patient  medications.  Discussed role of medication on blood glucose and possible side effects  Discussed blood glucose monitoring and interpretation.  Discussed recommended target ranges and individual ranges.    Described short-term complications: hyper- and hypo-glycemia.  Discussed causes,symptoms, and treatment options.  Discussed prevention, detection, and treatment of long-term complications.  Discussed the role of prolonged elevated glucose levels on body systems.  Discussed role of stress on blood glucose levels and discussed strategies to manage psychosocial issues.  Discussed recommendations for long-term diabetes self-care.  Provided checklist for medical, dental, and emotional self-care.  Plan:  Aim for 3 Carb Choices per meal (45 grams) +/- 1 either way  Aim for 0-2 Carbs per snack if hungry  Include protein in moderation with your meals and snacks Consider reading food labels for Total Carbohydrate of foods Continue with your activity level by walking for 30-60 minutes daily as tolerated Consider checking BG at alternate times per day    Teaching Method Utilized: Visual, Auditory and Hands on  Handouts given during visit include: Living Well with Diabetes Carb Counting and Food Label handouts Meal Plan Card Diabetes Medication handout  Barriers to learning/adherence to lifestyle change: none  Diabetes self-care support plan:   [facility] support group available  Demonstrated degree of understanding via:  Teach Back   Monitoring/Evaluation:  Dietary intake, exercise, reading food labels, and body weight prn.

## 2013-08-30 NOTE — Patient Instructions (Signed)
Plan:  Aim for 3 Carb Choices per meal (45 grams) +/- 1 either way  Aim for 0-2 Carbs per snack if hungry  Include protein in moderation with your meals and snacks Consider reading food labels for Total Carbohydrate of foods Continue with your activity level by walking for 30-60 minutes daily as tolerated Consider checking BG at alternate times per day

## 2014-01-09 ENCOUNTER — Other Ambulatory Visit: Payer: Self-pay

## 2014-01-09 DIAGNOSIS — Z1231 Encounter for screening mammogram for malignant neoplasm of breast: Secondary | ICD-10-CM

## 2014-01-13 ENCOUNTER — Ambulatory Visit
Admission: RE | Admit: 2014-01-13 | Discharge: 2014-01-13 | Disposition: A | Discharge status: Home / Self Care | Payer: Medicare Other | Source: Ambulatory Visit

## 2014-01-13 DIAGNOSIS — Z1231 Encounter for screening mammogram for malignant neoplasm of breast: Secondary | ICD-10-CM

## 2014-12-13 ENCOUNTER — Other Ambulatory Visit: Payer: Self-pay

## 2014-12-13 DIAGNOSIS — Z1231 Encounter for screening mammogram for malignant neoplasm of breast: Secondary | ICD-10-CM

## 2015-01-18 ENCOUNTER — Ambulatory Visit
Admission: RE | Admit: 2015-01-18 | Discharge: 2015-01-18 | Disposition: A | Discharge status: Home / Self Care | Payer: Medicare Other | Source: Ambulatory Visit

## 2015-01-18 DIAGNOSIS — Z1231 Encounter for screening mammogram for malignant neoplasm of breast: Secondary | ICD-10-CM

## 2016-03-10 HISTORY — PX: CATARACT EXTRACTION W/ INTRAOCULAR LENS  IMPLANT, BILATERAL: SHX1307

## 2016-03-12 ENCOUNTER — Other Ambulatory Visit: Payer: Self-pay | Admitting: Family Medicine

## 2016-03-12 DIAGNOSIS — Z1231 Encounter for screening mammogram for malignant neoplasm of breast: Secondary | ICD-10-CM

## 2016-03-12 DIAGNOSIS — Z853 Personal history of malignant neoplasm of breast: Secondary | ICD-10-CM

## 2016-04-03 ENCOUNTER — Ambulatory Visit: Payer: Medicare Other

## 2016-04-29 ENCOUNTER — Ambulatory Visit
Admission: RE | Admit: 2016-04-29 | Discharge: 2016-04-29 | Disposition: A | Discharge status: Home / Self Care | Payer: Medicare Other | Source: Ambulatory Visit | Attending: Family Medicine | Admitting: Family Medicine

## 2016-04-29 DIAGNOSIS — Z853 Personal history of malignant neoplasm of breast: Secondary | ICD-10-CM

## 2016-04-29 DIAGNOSIS — Z1231 Encounter for screening mammogram for malignant neoplasm of breast: Secondary | ICD-10-CM

## 2016-04-29 IMAGING — MG 2D DIGITAL SCREENING BILATERAL MAMMOGRAM WITH CAD AND ADJUNCT TO
8 of 12 series · 8 of 28 positions shown · non-contrast
Comparison: Previous exam(s).

CLINICAL DATA: Screening.

EXAM:
2D DIGITAL SCREENING BILATERAL MAMMOGRAM WITH CAD AND ADJUNCT TOMO

[R MLO synth-2D]
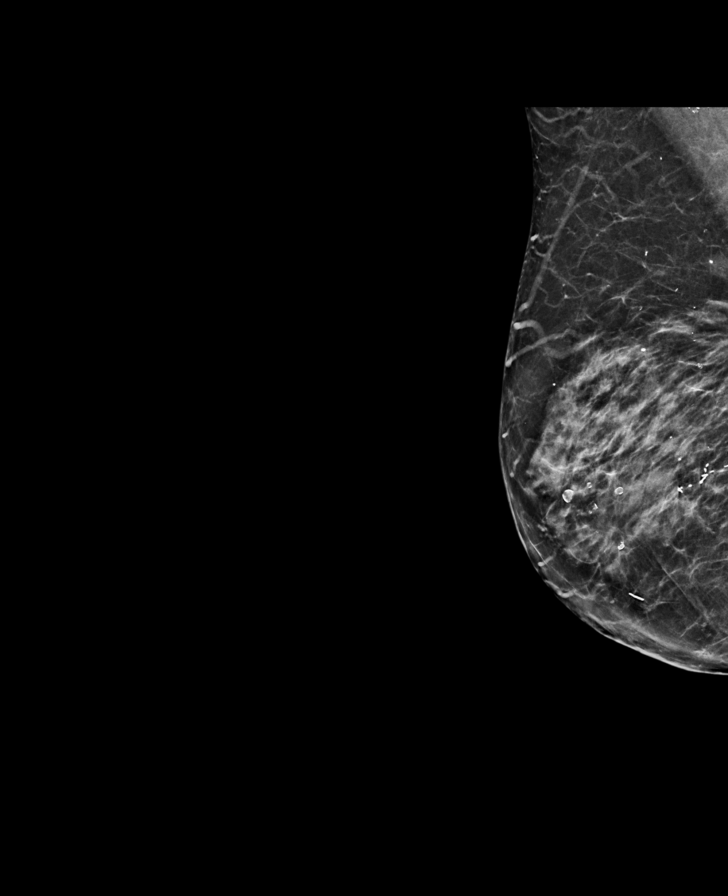

[R MLO]
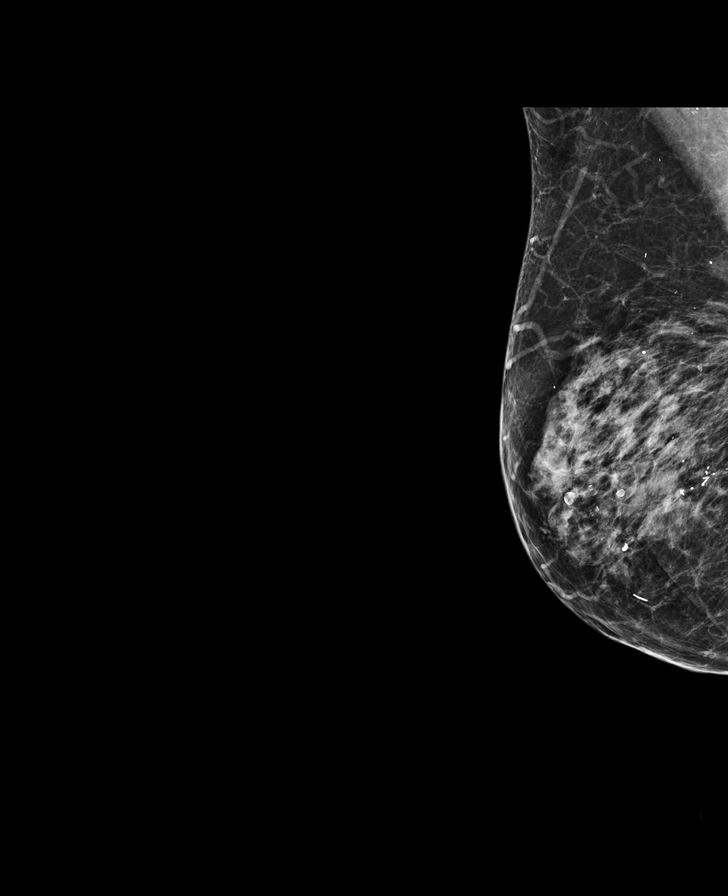

[R CC synth-2D]
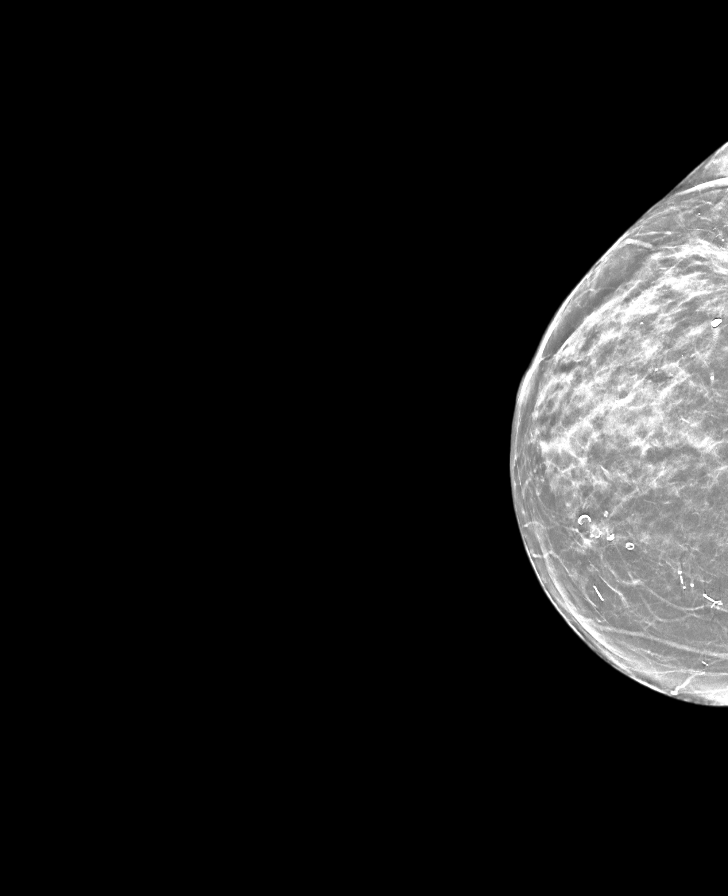

[L CC]
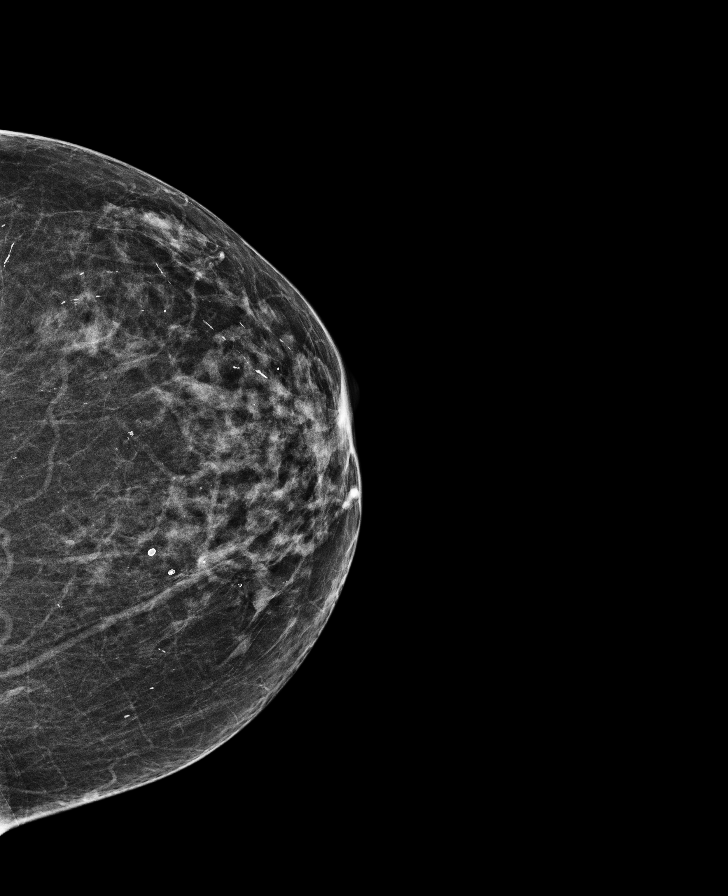

[L MLO synth-2D]
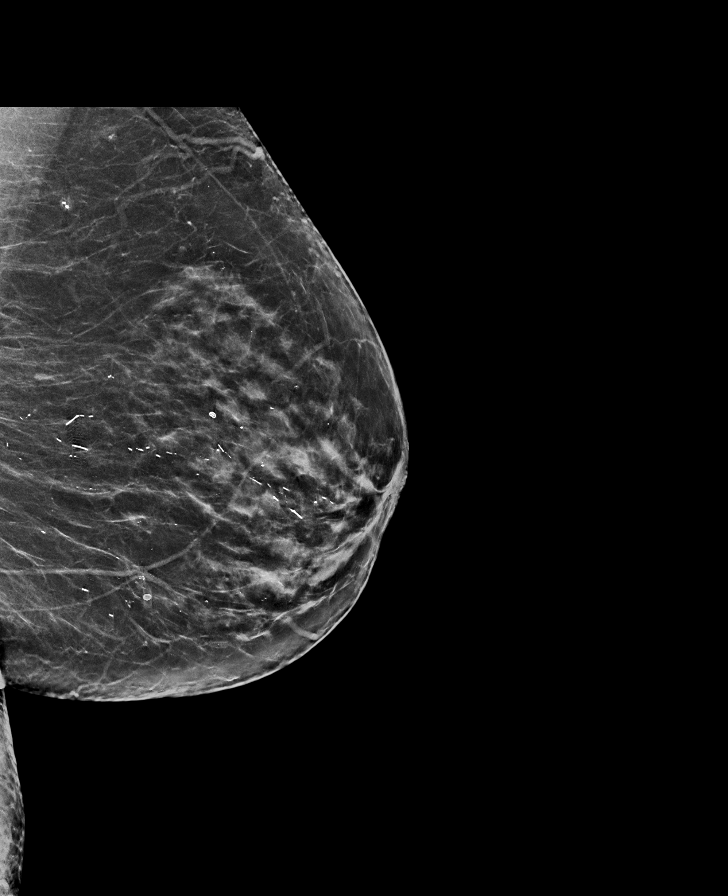

[L CC synth-2D]
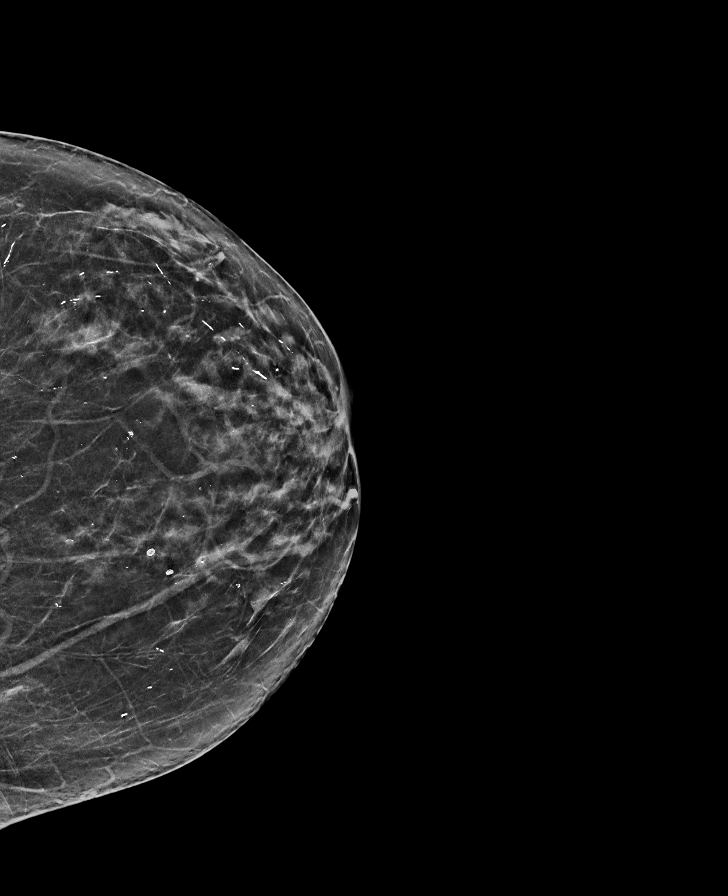

[R CC]
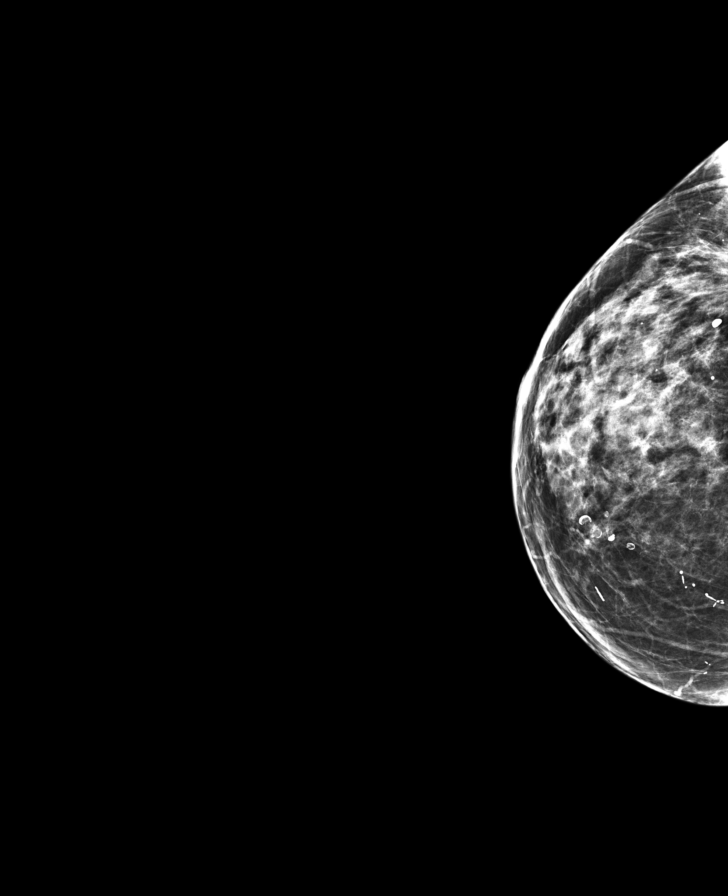

[L MLO]
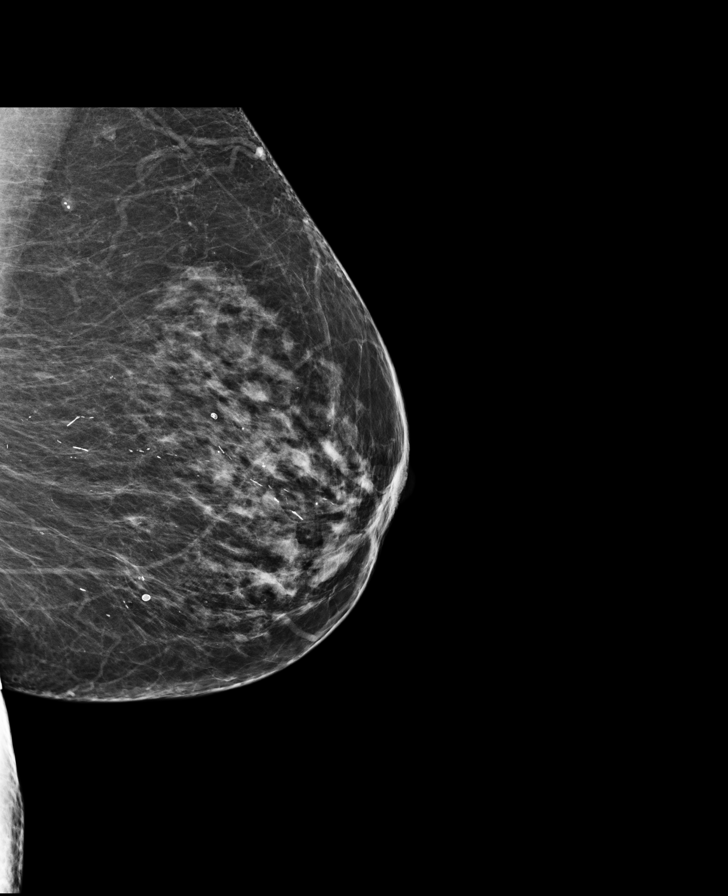

[8 of 28 positions shown; findings below may reference images not displayed]

ACR Breast Density Category c: The breast tissue is heterogeneously
dense, which may obscure small masses.
FINDINGS: There are no findings suspicious for malignancy. Images were
processed with CAD.
IMPRESSION: No mammographic evidence of malignancy. A result letter of this
screening mammogram will be mailed directly to the patient.

RECOMMENDATION:
Screening mammogram in one year. (Code:[TA])

BI-RADS CATEGORY  1: Negative.

## 2018-12-31 ENCOUNTER — Other Ambulatory Visit: Payer: Self-pay

## 2018-12-31 DIAGNOSIS — Z20822 Contact with and (suspected) exposure to covid-19: Secondary | ICD-10-CM

## 2019-01-01 LAB — NOVEL CORONAVIRUS, NAA: SARS-CoV-2, NAA: DETECTED — AB

## 2019-01-19 ENCOUNTER — Other Ambulatory Visit: Payer: Self-pay

## 2019-01-19 DIAGNOSIS — Z20822 Contact with and (suspected) exposure to covid-19: Secondary | ICD-10-CM

## 2019-01-21 LAB — NOVEL CORONAVIRUS, NAA: SARS-CoV-2, NAA: NOT DETECTED

## 2019-03-21 ENCOUNTER — Telehealth: Payer: Self-pay | Admitting: Hematology and Oncology

## 2019-03-21 NOTE — Telephone Encounter (Signed)
A new hem appt has been scheduled for Ms. Heminger to see Dr. Lorenso Courier on 1/21 at 9am w/labs at 10:15am. Pt aware to arrive 15 minutes early.

## 2019-03-28 NOTE — Progress Notes (Signed)
[facility] [phone]   [phone] [place] NOTE  Patient Care Team: Antony Contras, MD as PCP - General (Family Medicine)  Hematological/Oncological History #Leukopenia, Lymphopenia #Thrombocytopenia 1) 05/15/2010: WBC 4.5, Hgb 14.1, Plt 124, MCV 88.5. ANC 2.9, Lymph 1.1. Earliest CBC on record.  2) 01/25/2019: WBC 3.0, Hgb 12.8, Plt 101, MCV 93.3. ANC 1.8, Lymph 0.8, Mono 0.2 3) 03/15/2019: WBC 2.5, Hgb 12.9, Plt 96. MCV 93.6. ANC 1.5, Lymph 0.7, Mono 0.2 4) 03/31/2019: establish care with Dr. Lorenso Courier   CHIEF COMPLAINTS/PURPOSE OF CONSULTATION:  Decreased WBC  HISTORY OF PRESENTING ILLNESS:  Catherine Decker 79 y.o. female with medical history significant for breast cancer s/p chemo/radiation completed in 2006, cirrhosis of the liver, DM type II, and GERD who presents for evaluation of a bicytopenia. The patient was referred by Dr. Antony Contras.   On review of the previous records [name] has a longstanding history of cirrhosis of the liver with an etiology that is unclear.  It is thought to be secondary to a childhood illness.  On review of her hematological history she has longstanding thrombocytopenia dating back to at least 05/15/2010.  At that time she was noted to have a platelet count of 124 white blood cell count of 4.5 and hemoglobin of 14.1.  This is the earliest CBC we have on record.  More recently in November 2020 she was found to have a white blood cell count of 3.0 platelets of 101 with an ANC of 1.8.  The last white blood cell count she had was on 03/15/2019 at which time she was found to have a white blood cell count of 2.5 platelets of 96 and ANC of 1.5.  Due to concern for her worsening white count and platelet count she was referred to hematology for further evaluation and management.  On exam today [name] notes that her major issue is left shoulder pain for which she sees a Restaurant manager, fast food.  She notes that this has been a  chronic issue going on for many years and predominately bothers her when she is reaching upwards.  On further discussion she notes that she does have a diagnosis of cirrhosis of the liver and has been aware of it for last 2 to 3 years.  She reports that she has been told she had nonalcoholic steatohepatitis and has never established with a hepatologist.  She reports that she has never had any issues with bleeding or dark stools, but that she has noticed increased bruising on her arms.  She denies having any issues with recurrent infections fevers, chills, sweats, nausea, vomiting or diarrhea.  She does endorse having a Covid infection late last year, but reports that the symptoms were mild and consisted mostly of headache and cough.  She otherwise has been in her normal state of health with no changes in her weight or no other issues with her functional status.  She had no additional questions or concerns today.  A full 10 point ROS is listed below.  MEDICAL HISTORY:  Past Medical History:  Diagnosis Date  . Anemia   . Cancer    right breast   . Cirrhosis of liver   . Diabetes mellitus   . GERD (gastroesophageal reflux disease)    Also, Hiatal Hernia   . Hyperlipidemia   . Splenic artery aneurysm     SURGICAL HISTORY: Past Surgical History:  Procedure Laterality Date  . BREAST LUMPECTOMY     Right breast lumpectomy  .  CHOLECYSTECTOMY  04/2010  . TUBAL LIGATION  1975    SOCIAL HISTORY: Social History   Socioeconomic History  . Marital status: Married    Spouse name: Not on file  . Number of children: Not on file  . Years of education: Not on file  . Highest education level: Not on file  Occupational History  . Not on file  Tobacco Use  . Smoking status: Never Smoker  Substance and Sexual Activity  . Alcohol use: No  . Drug use: No  . Sexual activity: Not on file  Other Topics Concern  . Not on file  Social History Narrative  . Not on file   Social Determinants of Health    Financial Resource Strain:   . Difficulty of Paying Living Expenses: Not on file  Food Insecurity:   . Worried About Charity fundraiser in the Last Year: Not on file  . Ran Out of Food in the Last Year: Not on file  Transportation Needs:   . Lack of Transportation (Medical): Not on file  . Lack of Transportation (Non-Medical): Not on file  Physical Activity:   . Days of Exercise per Week: Not on file  . Minutes of Exercise per Session: Not on file  Stress:   . Feeling of Stress : Not on file  Social Connections:   . Frequency of Communication with Friends and Family: Not on file  . Frequency of Social Gatherings with Friends and Family: Not on file  . Attends Religious Services: Not on file  . Active Member of Clubs or Organizations: Not on file  . Attends Archivist Meetings: Not on file  . Marital Status: Not on file  Intimate Partner Violence:   . Fear of Current or Ex-Partner: Not on file  . Emotionally Abused: Not on file  . Physically Abused: Not on file  . Sexually Abused: Not on file    FAMILY HISTORY: Family History  Problem Relation Age of Onset  . Diabetes Mother   . Heart disease Mother   . Hypertension Mother   . Cancer Mother   . Heart disease Father   . Cancer Father        prostate  . Heart attack Father   . Cancer Sister        colon  . Hypertension Sister   . Varicose Veins Sister   . Hyperlipidemia Sister   . Heart disease Brother   . Cancer Brother        prostate  . Diabetes Brother   . Hypertension Brother   . Hypertension Brother   . Heart attack Brother   . Hypertension Daughter   . Hypertension Son     ALLERGIES:  is allergic to meperidine and demerol.  MEDICATIONS:  Current Outpatient Medications  Medication Sig Dispense Refill  . cholecalciferol (VITAMIN D3) 25 MCG (1000 UNIT) tablet Take 1,000 Units by mouth daily.    Marland Kitchen CINNAMON PO Take by mouth.    . co-enzyme Q-10 30 MG capsule Take 30 mg by mouth daily.    Marland Kitchen  ibuprofen (ADVIL) 200 MG tablet Take 200 mg by mouth every 6 (six) hours as needed.    Marland Kitchen levothyroxine (EUTHYROX) 125 MCG tablet Take 125 mcg by mouth daily before breakfast.    . fenofibrate 160 MG tablet Take 160 mg by mouth daily.    . fish oil-omega-3 fatty acids 1000 MG capsule Take 1 g by mouth daily.    . metFORMIN (GLUCOPHAGE)  500 MG tablet Take 500 mg by mouth daily with breakfast.    . omeprazole (PRILOSEC) 20 MG capsule Take 20 mg by mouth daily.    . simvastatin (ZOCOR) 20 MG tablet Take 20 mg by mouth every evening.     No current facility-administered medications for this visit.    REVIEW OF SYSTEMS:   Constitutional: ( - ) fevers, ( - )  chills , ( - ) night sweats Eyes: ( - ) blurriness of vision, ( - ) double vision, ( - ) watery eyes Ears, nose, mouth, throat, and face: ( - ) mucositis, ( - ) sore throat Respiratory: ( - ) cough, ( - ) dyspnea, ( - ) wheezes Cardiovascular: ( - ) palpitation, ( - ) chest discomfort, ( - ) lower extremity swelling Gastrointestinal:  ( - ) nausea, ( - ) heartburn, ( - ) change in bowel habits Skin: ( - ) abnormal skin rashes Lymphatics: ( - ) new lymphadenopathy, ( - ) easy bruising Neurological: ( - ) numbness, ( - ) tingling, ( - ) new weaknesses Behavioral/Psych: ( - ) mood change, ( - ) new changes  All other systems were reviewed with the patient and are negative.  PHYSICAL EXAMINATION: ECOG PERFORMANCE STATUS: 0 - Asymptomatic  Vitals:   03/31/19 0924  BP: (!) 147/65  Pulse: 72  Resp: 16  Temp: 97.9 F (36.6 C)  SpO2: 99%   Filed Weights   03/31/19 0924  Weight: 170 lb 14.4 oz (77.5 kg)    GENERAL: well appearing elderly Caucasian female in NAD  SKIN: skin color, texture, turgor are normal, no rashes or significant lesions EYES: conjunctiva are pink and non-injected, sclera clear LUNGS: clear to auscultation and percussion with normal breathing effort HEART: regular rate & rhythm and no murmurs and no lower extremity  edema ABDOMEN: soft, non-tender, non-distended, normal bowel sounds Musculoskeletal: no cyanosis of digits and no clubbing  PSYCH: alert & oriented x 3, fluent speech NEURO: no focal motor/sensory deficits  LABORATORY DATA:  I have reviewed the data as listed CBC Latest Ref Rng & Units 05/17/2010 05/15/2010  WBC 4.0 - 10.5 K/uL 6.2 4.5  Hemoglobin 12.0 - 15.0 g/dL 12.8 14.1  Hematocrit 36.0 - 46.0 % 38.0 41.5  Platelets 150 - 400 K/uL 119(L) 124(L)    CMP Latest Ref Rng & Units 06/01/2013 05/17/2010 05/15/2010  Glucose 70 - 99 mg/dL - 131(H) 149(H)  BUN 6 - 23 mg/dL '14 13 12  '$ Creatinine 0.50 - 1.10 mg/dL 0.85 1.09 0.86  Sodium 135 - 145 mEq/L - 136 138  Potassium 3.5 - 5.1 mEq/L - 4.2 4.5  Chloride 96 - 112 mEq/L - 101 101  CO2 19 - 32 mEq/L - 27 24  Calcium 8.4 - 10.5 mg/dL - 8.8 9.5  Total Protein 6.0 - 8.3 g/dL - 6.5 7.3  Total Bilirubin 0.3 - 1.2 mg/dL - 1.2 0.6  Alkaline Phos 39 - 117 U/L - 28(L) 36(L)  AST 0 - 37 U/L - 52(H) 42(H)  ALT 0 - 35 U/L - 32 28     PATHOLOGY: None to review.   BLOOD FILM:  Review of the peripheral blood smear showed normal appearing white cells with neutrophils that were appropriately lobated and granulated. There was no predominance of bi-lobed or hyper-segmented neutrophils appreciated. No Dohle bodies were noted. There was no left shifting, immature forms or blasts noted. Lymphocytes remain normal in size without any predominance of large granular lymphocytes. Red cells show  no anisopoikilocytosis, macrocytes , microcytes or polychromasia. There were no schistocytes, target cells, echinocytes, acanthocytes, dacrocytes, or stomatocytes.There was no rouleaux formation, nucleated red cells, or intra-cellular inclusions noted. The platelets are normal in size, shape, and color without any clumping evident.  RADIOGRAPHIC STUDIES: None to review:  No results found.  ASSESSMENT & PLAN ANGELEA PENNY 79 y.o. female with medical history significant for  breast cancer s/p chemo/radiation completed in 2006, cirrhosis of the liver, DM type II, and GERD who presents for evaluation of a bicytopenia.  After review the labs and discussion with the patient the findings are most consistent with a bicytopenia secondary to progressive liver disease.  Thrombocytopenia is longstanding and has been gradually trending downward.  The white blood cell count has also similarly been following this trend. This pattern can be seen in cirrhosis, particularly when the Hgb is normal with no alterations in the MCV.  In order to rule out alternative etiologies today we will order levels of vitamin B12, folate, MMA, homocystine as well as liver function panel.    Additionally we will try to rule out multiple myeloma by ordering an SPEP and serum free light chains.  If no etiology is clearly discerned we may need to consider bone marrow biopsy, though with a perfectly normal hemoglobin this is likely not necessary.  At this time I would recommend referral to hepatology clinic for further evaluation and considering a screening of [place].  We will have her return to our clinic in approximately 3 months time assuming there is no etiology discerned in our work-up today.  #Bicytopenia: Leukopenia and Thrombocytopenia --today will order baseline CBC, CMP, and peripheral blood film --additionally will collect nutritional labs to include Vitamin B12, folate, MMA, homocysteine --to assess liver function will order CMP, PT/INR, and albumin  --will order MM workup to include SPEP and SFLC --if no etiology is clearly discerned by the above labs we will need to consider a bone marrow biopsy.  --most likely etiology at this time is the patient's cirrhosis of the liver.  --RTC in 3 months to assure stability of her counts.  #Cirrhosis of the Liver --unclear etiology, thought be 2/2 to childhood illness with hepatitis --recommend referral to hepatology for evaluation and [place] screening --continue  to monitor   #History of Breast Cancer --underwent lumpectomy and radiation therapy in 2000 --declined chemotherapy and only received 6 months of tamoxifen therapy --last mammogram performed in 2018 --will schedule for repeat mammogram and continue surviellance  Orders Placed This Encounter  Procedures  . CBC with Differential (Cancer Center Only)    Standing Status:   Future    Number of Occurrences:   1    Standing Expiration Date:   03/30/2020  . Immature Platelet Fraction    Standing Status:   Future    Number of Occurrences:   1    Standing Expiration Date:   03/30/2020  . Save Smear (SSMR)    Standing Status:   Future    Number of Occurrences:   1    Standing Expiration Date:   03/30/2020  . CMP ([facility] only)    Standing Status:   Future    Number of Occurrences:   1    Standing Expiration Date:   03/30/2020  . TSH    Standing Status:   Future    Number of Occurrences:   1    Standing Expiration Date:   03/30/2020  . Vitamin B12    Standing Status:  Future    Number of Occurrences:   1    Standing Expiration Date:   03/30/2020  . Folate, Serum    Standing Status:   Future    Number of Occurrences:   1    Standing Expiration Date:   03/30/2020  . Methylmalonic acid, serum    Standing Status:   Future    Number of Occurrences:   1    Standing Expiration Date:   03/30/2020  . SPEP (Serum protein electrophoresis)    Standing Status:   Future    Number of Occurrences:   1    Standing Expiration Date:   03/30/2020  . Kappa/lambda light chains    Standing Status:   Future    Number of Occurrences:   1    Standing Expiration Date:   03/30/2020  . Homocysteine, serum    Standing Status:   Future    Number of Occurrences:   1    Standing Expiration Date:   03/30/2020  . Protime-INR    Standing Status:   Future    Number of Occurrences:   1    Standing Expiration Date:   03/30/2020  . APTT    Standing Status:   Future    Number of Occurrences:   1    Standing  Expiration Date:   03/30/2020    All questions were answered. The patient knows to call the clinic with any problems, questions or concerns.  A total of more than 60 minutes were spent on this encounter and over half of that time was spent on counseling and coordination of care as outlined above.   Ledell Peoples, MD Department of Hematology/Oncology [place] at [facility] Phone: [phone] Pager: [phone] Email: [email or web address]  03/31/2019 10:42 AM

## 2019-03-31 ENCOUNTER — Inpatient Hospital Stay: Payer: Medicare Other

## 2019-03-31 ENCOUNTER — Encounter: Payer: Self-pay | Admitting: Hematology and Oncology

## 2019-03-31 ENCOUNTER — Other Ambulatory Visit: Payer: Self-pay

## 2019-03-31 ENCOUNTER — Inpatient Hospital Stay: Payer: Medicare Other | Attending: Hematology and Oncology | Admitting: Hematology and Oncology

## 2019-03-31 VITALS — BP 147/65 | HR 72 | Temp 97.9°F | Resp 16 | Ht 63.0 in | Wt 170.9 lb

## 2019-03-31 DIAGNOSIS — E785 Hyperlipidemia, unspecified: Secondary | ICD-10-CM | POA: Insufficient documentation

## 2019-03-31 DIAGNOSIS — E119 Type 2 diabetes mellitus without complications: Secondary | ICD-10-CM | POA: Diagnosis not present

## 2019-03-31 DIAGNOSIS — Z853 Personal history of malignant neoplasm of breast: Secondary | ICD-10-CM | POA: Insufficient documentation

## 2019-03-31 DIAGNOSIS — D72819 Decreased white blood cell count, unspecified: Secondary | ICD-10-CM | POA: Diagnosis not present

## 2019-03-31 DIAGNOSIS — C50919 Malignant neoplasm of unspecified site of unspecified female breast: Secondary | ICD-10-CM

## 2019-03-31 DIAGNOSIS — Z9221 Personal history of antineoplastic chemotherapy: Secondary | ICD-10-CM | POA: Insufficient documentation

## 2019-03-31 DIAGNOSIS — K7460 Unspecified cirrhosis of liver: Secondary | ICD-10-CM | POA: Insufficient documentation

## 2019-03-31 DIAGNOSIS — D696 Thrombocytopenia, unspecified: Secondary | ICD-10-CM

## 2019-03-31 DIAGNOSIS — Z7984 Long term (current) use of oral hypoglycemic drugs: Secondary | ICD-10-CM | POA: Insufficient documentation

## 2019-03-31 DIAGNOSIS — K219 Gastro-esophageal reflux disease without esophagitis: Secondary | ICD-10-CM | POA: Insufficient documentation

## 2019-03-31 DIAGNOSIS — Z923 Personal history of irradiation: Secondary | ICD-10-CM | POA: Diagnosis not present

## 2019-03-31 DIAGNOSIS — Z79899 Other long term (current) drug therapy: Secondary | ICD-10-CM | POA: Diagnosis not present

## 2019-03-31 LAB — CBC WITH DIFFERENTIAL (CANCER CENTER ONLY)
Abs Immature Granulocytes: 0.01 10*3/uL (ref 0.00–0.07)
Basophils Absolute: 0 10*3/uL (ref 0.0–0.1)
Basophils Relative: 1 %
Eosinophils Absolute: 0.1 10*3/uL (ref 0.0–0.5)
Eosinophils Relative: 3 %
HCT: 39.6 % (ref 36.0–46.0)
Hemoglobin: 13.1 g/dL (ref 12.0–15.0)
Immature Granulocytes: 0 %
Lymphocytes Relative: 19 %
Lymphs Abs: 0.7 10*3/uL (ref 0.7–4.0)
MCH: 31.5 pg (ref 26.0–34.0)
MCHC: 33.1 g/dL (ref 30.0–36.0)
MCV: 95.2 fL (ref 80.0–100.0)
Monocytes Absolute: 0.2 10*3/uL (ref 0.1–1.0)
Monocytes Relative: 6 %
Neutro Abs: 2.5 10*3/uL (ref 1.7–7.7)
Neutrophils Relative %: 71 %
Platelet Count: 96 10*3/uL — ABNORMAL LOW (ref 150–400)
RBC: 4.16 MIL/uL (ref 3.87–5.11)
RDW: 12.6 % (ref 11.5–15.5)
WBC Count: 3.5 10*3/uL — ABNORMAL LOW (ref 4.0–10.5)
nRBC: 0 % (ref 0.0–0.2)

## 2019-03-31 LAB — CMP (CANCER CENTER ONLY)
ALT: 17 U/L (ref 0–44)
AST: 29 U/L (ref 15–41)
Albumin: 4 g/dL (ref 3.5–5.0)
Alkaline Phosphatase: 49 U/L (ref 38–126)
Anion gap: 10 (ref 5–15)
BUN: 21 mg/dL (ref 8–23)
CO2: 24 mmol/L (ref 22–32)
Calcium: 9.5 mg/dL (ref 8.9–10.3)
Chloride: 108 mmol/L (ref 98–111)
Creatinine: 1.22 mg/dL — ABNORMAL HIGH (ref 0.44–1.00)
GFR, Est AFR Am: 49 mL/min — ABNORMAL LOW (ref >60)
GFR, Estimated: 43 mL/min — ABNORMAL LOW (ref >60)
Glucose, Bld: 119 mg/dL — ABNORMAL HIGH (ref 70–99)
Potassium: 4.3 mmol/L (ref 3.5–5.1)
Sodium: 142 mmol/L (ref 135–145)
Total Bilirubin: 0.8 mg/dL (ref 0.3–1.2)
Total Protein: 7.3 g/dL (ref 6.5–8.1)

## 2019-03-31 LAB — FOLATE: Folate: 5.1 ng/mL — ABNORMAL LOW (ref >5.9)

## 2019-03-31 LAB — IMMATURE PLATELET FRACTION: Immature Platelet Fraction: 1.9 % (ref 1.2–8.6)

## 2019-03-31 LAB — PROTIME-INR
INR: 1.1 (ref 0.8–1.2)
Prothrombin Time: 14.4 seconds (ref 11.4–15.2)

## 2019-03-31 LAB — SAVE SMEAR(SSMR), FOR PROVIDER SLIDE REVIEW

## 2019-03-31 LAB — APTT: aPTT: 35 seconds (ref 24–36)

## 2019-03-31 LAB — TSH: TSH: 0.296 u[IU]/mL — ABNORMAL LOW (ref 0.308–3.960)

## 2019-03-31 LAB — VITAMIN B12: Vitamin B-12: 194 pg/mL (ref 180–914)

## 2019-03-31 NOTE — Progress Notes (Signed)
Pt had Flu vaccine in September 2020 in [place]

## 2019-04-01 ENCOUNTER — Encounter: Payer: Self-pay | Admitting: Hematology and Oncology

## 2019-04-01 ENCOUNTER — Telehealth: Payer: Self-pay | Admitting: Hematology and Oncology

## 2019-04-01 LAB — KAPPA/LAMBDA LIGHT CHAINS
Kappa free light chain: 37 mg/L — ABNORMAL HIGH (ref 3.3–19.4)
Kappa, lambda light chain ratio: 1.19 (ref 0.26–1.65)
Lambda free light chains: 31.2 mg/L — ABNORMAL HIGH (ref 5.7–26.3)

## 2019-04-01 LAB — PROTEIN ELECTROPHORESIS, SERUM
A/G Ratio: 1.2 (ref 0.7–1.7)
Albumin ELP: 4 g/dL (ref 2.9–4.4)
Alpha-1-Globulin: 0.2 g/dL (ref 0.0–0.4)
Alpha-2-Globulin: 0.7 g/dL (ref 0.4–1.0)
Beta Globulin: 1.1 g/dL (ref 0.7–1.3)
Gamma Globulin: 1.3 g/dL (ref 0.4–1.8)
Globulin, Total: 3.3 g/dL (ref 2.2–3.9)
Total Protein ELP: 7.3 g/dL (ref 6.0–8.5)

## 2019-04-01 LAB — HOMOCYSTEINE: Homocysteine: 31.1 umol/L — ABNORMAL HIGH (ref 0.0–19.2)

## 2019-04-01 NOTE — Telephone Encounter (Signed)
Scheduled per los. Called and spoke with patient. Confirmed appt 

## 2019-04-05 LAB — METHYLMALONIC ACID, SERUM: Methylmalonic Acid, Quantitative: 386 nmol/L — ABNORMAL HIGH (ref 0–378)

## 2019-04-06 ENCOUNTER — Other Ambulatory Visit: Payer: Self-pay | Admitting: *Deleted

## 2019-04-06 ENCOUNTER — Telehealth: Payer: Self-pay | Admitting: Hematology and Oncology

## 2019-04-06 DIAGNOSIS — C50919 Malignant neoplasm of unspecified site of unspecified female breast: Secondary | ICD-10-CM

## 2019-04-06 DIAGNOSIS — K7469 Other cirrhosis of liver: Secondary | ICD-10-CM

## 2019-04-06 MED ORDER — VITAMIN B-12 1000 MCG PO TABS: 1000.0000 ug | ORAL_TABLET | Freq: Every day | ORAL | 3 refills | Status: DC

## 2019-04-06 MED ORDER — FOLIC ACID 1 MG PO TABS: 1.0000 mg | ORAL_TABLET | Freq: Every day | ORAL | 3 refills | Status: DC

## 2019-04-06 NOTE — Telephone Encounter (Addendum)
Called this Catherine Decker discussed the results of her blood work in the prior week.  Findings are most consistent with a vitamin B12 deficiency with/without folate deficiency.  As such I would recommend at this time beginning the patient on 1000 mcg of vitamin B12 p.o. as well as 1 mg of folic acid daily.  We will have the patient return in approximately 3 months time to determine if there has been any improvement in her blood counts with this therapy.  I would still favor liver dysfunction as the primary cause of her leukopenia and thrombocytopenia given the lack of any changes in her red blood cells.  Ledell Peoples, MD Department of Hematology/Oncology [place] at [facility] Phone: [phone] Pager: [phone] Email: [email or web address]

## 2019-04-07 ENCOUNTER — Telehealth: Payer: Self-pay | Admitting: *Deleted

## 2019-04-07 NOTE — Telephone Encounter (Signed)
TCT ptient regarding her mammogram. Advised patient to call the Breast Center to schedule her mammogram.  Informed her that they have the order and she can call to schedule at any time.  Breast Center # provided  [phone]  Pt voiced understanding.

## 2019-04-22 ENCOUNTER — Encounter: Payer: Self-pay | Admitting: Hematology and Oncology

## 2019-04-29 ENCOUNTER — Telehealth: Payer: Self-pay

## 2019-04-29 NOTE — Telephone Encounter (Signed)
TCT patient regarding her referral unable to reach her. Left a VM message that the referral is being made and she should get a call for an appointment soon. Also encouraged her to call the mammogram centre back for an appointment.

## 2019-04-29 NOTE — Addendum Note (Signed)
Addended by: Narda Rutherford T on: 04/29/2019 07:03 PM   Modules accepted: Orders

## 2019-05-06 ENCOUNTER — Other Ambulatory Visit (INDEPENDENT_AMBULATORY_CARE_PROVIDER_SITE_OTHER): Payer: Medicare Other

## 2019-05-06 ENCOUNTER — Encounter: Payer: Self-pay | Admitting: Gastroenterology

## 2019-05-06 ENCOUNTER — Other Ambulatory Visit: Payer: Self-pay

## 2019-05-06 ENCOUNTER — Ambulatory Visit: Payer: Medicare Other | Admitting: Gastroenterology

## 2019-05-06 VITALS — BP 130/60 | HR 80 | Temp 98.4°F | Ht 63.0 in | Wt 172.5 lb

## 2019-05-06 DIAGNOSIS — R161 Splenomegaly, not elsewhere classified: Secondary | ICD-10-CM

## 2019-05-06 DIAGNOSIS — K766 Portal hypertension: Secondary | ICD-10-CM

## 2019-05-06 DIAGNOSIS — I728 Aneurysm of other specified arteries: Secondary | ICD-10-CM

## 2019-05-06 DIAGNOSIS — K7460 Unspecified cirrhosis of liver: Secondary | ICD-10-CM

## 2019-05-06 DIAGNOSIS — K7581 Nonalcoholic steatohepatitis (NASH): Secondary | ICD-10-CM

## 2019-05-06 DIAGNOSIS — K219 Gastro-esophageal reflux disease without esophagitis: Secondary | ICD-10-CM

## 2019-05-06 LAB — PROTIME-INR
INR: 1.1 ratio — ABNORMAL HIGH (ref 0.8–1.0)
Prothrombin Time: 12.7 s (ref 9.6–13.1)

## 2019-05-06 LAB — AMMONIA: Ammonia: 27 umol/L (ref 11–35)

## 2019-05-06 LAB — FERRITIN: Ferritin: 54.3 ng/mL (ref 10.0–291.0)

## 2019-05-06 NOTE — Progress Notes (Signed)
Catherine Decker    [id number]    [birth year]  Primary Care Physician:Swayne, Shanon Brow, MD  Referring Physician: Antony Contras, MD [address] [place],  [place] [address]   Chief complaint: Cirrhosis  HPI:  79 year old female with history of breast cancer, splenic artery aneurysm with diagnosis of cirrhosis in 2012 at the time of cholecystectomy.   She was diagnosed with ?infectious hepatitis at age 61.  Does not know what kind of infection or hepatitis it was.  Denies any alcohol use.  No over-the-counter or herbal remedies.  She had Colonoscopy and endoscopy in [place] about 12 years ago  Denies any nausea, vomiting, abdominal pain, melena or bright red blood per rectum  No family history of liver disease, cirrhosis, GI malignancy or IBD.  CT abdomen with and without contrast Jul 22, 2010: Enlarged nodular liver consistent with cirrhosis, negative for any enhancing lesions suggestive of hepatoma  CT angio abdomen and pelvis with and without contrast June 02, 2013: 1.5 cm main splenic artery aneurysm and additional 6 mm aneurysm involving small branch of splenic artery Nodular hepatic liver suggestive of cirrhosis, no intra or extrahepatic duct dilation, no ascites, splenomegaly.  Outpatient Encounter Medications as of 05/06/2019  Medication Sig  . cholecalciferol (VITAMIN D3) 25 MCG (1000 UNIT) tablet Take 1,000 Units by mouth daily.  Marland Kitchen CINNAMON PO Take by mouth.  . co-enzyme Q-10 30 MG capsule Take 30 mg by mouth daily.  . fenofibrate 160 MG tablet Take 160 mg by mouth daily.  . fish oil-omega-3 fatty acids 1000 MG capsule Take 1 g by mouth daily.  . folic acid (FOLVITE) 1 MG tablet Take 1 tablet (1 mg total) by mouth daily.  Marland Kitchen levothyroxine (EUTHYROX) 125 MCG tablet Take 125 mcg by mouth daily before breakfast.  . losartan (COZAAR) 25 MG tablet SMARTSIG:.5 Tablet(s) By Mouth Daily  . metFORMIN (GLUCOPHAGE) 500 MG tablet Take 500 mg by mouth  daily with breakfast.  . omeprazole (PRILOSEC) 20 MG capsule Take 20 mg by mouth daily.  . pioglitazone (ACTOS) 15 MG tablet Take 15 mg by mouth daily.  . simvastatin (ZOCOR) 20 MG tablet Take 20 mg by mouth every evening.  . [DISCONTINUED] ibuprofen (ADVIL) 200 MG tablet Take 200 mg by mouth every 6 (six) hours as needed.  . [DISCONTINUED] vitamin B-12 (CYANOCOBALAMIN) 1000 MCG tablet Take 1 tablet (1,000 mcg total) by mouth daily.   No facility-administered encounter medications on file as of 05/06/2019.    Allergies as of 05/06/2019 - Review Complete 05/06/2019  Allergen Reaction Noted  . Meperidine Anaphylaxis 08/13/2015  . Demerol  06/04/2011    Past Medical History:  Diagnosis Date  . Anemia   . Cancer [facility])    right breast   . Cirrhosis of liver ([place])   . Diabetes mellitus   . GERD (gastroesophageal reflux disease)    Also, Hiatal Hernia   . Hyperlipidemia   . Splenic artery aneurysm [facility])     Past Surgical History:  Procedure Laterality Date  . BREAST LUMPECTOMY     Right breast lumpectomy  . CHOLECYSTECTOMY  04/2010  . TUBAL LIGATION  1975    Family History  Problem Relation Age of Onset  . Diabetes Mother   . Heart disease Mother   . Hypertension Mother   . Breast cancer Mother   . Heart disease Father   . Heart attack Father   . Prostate cancer Father   .  Hypertension Sister   . Varicose Veins Sister   . Hyperlipidemia Sister   . Heart disease Brother   . Diabetes Brother   . Hypertension Brother   . Prostate cancer Brother   . Cirrhosis Brother   . Hypertension Brother   . Hypertension Daughter   . Hypertension Son   . Colon cancer Neg Hx   . Liver cancer Neg Hx     Social History   Socioeconomic History  . Marital status: Widowed    Spouse name: Not on file  . Number of children: 3  . Years of education: Not on file  . Highest education level: Not on file  Occupational History  . Occupation: retired  Tobacco Use  . Smoking status:  Never Smoker  . Smokeless tobacco: Never Used  Substance and Sexual Activity  . Alcohol use: No  . Drug use: No  . Sexual activity: Not on file  Other Topics Concern  . Not on file  Social History Narrative  . Not on file   Social Determinants of Health   Financial Resource Strain:   . Difficulty of Paying Living Expenses: Not on file  Food Insecurity:   . Worried About Charity fundraiser in the Last Year: Not on file  . Ran Out of Food in the Last Year: Not on file  Transportation Needs:   . Lack of Transportation (Medical): Not on file  . Lack of Transportation (Non-Medical): Not on file  Physical Activity:   . Days of Exercise per Week: Not on file  . Minutes of Exercise per Session: Not on file  Stress:   . Feeling of Stress : Not on file  Social Connections:   . Frequency of Communication with Friends and Family: Not on file  . Frequency of Social Gatherings with Friends and Family: Not on file  . Attends Religious Services: Not on file  . Active Member of Clubs or Organizations: Not on file  . Attends Archivist Meetings: Not on file  . Marital Status: Not on file  Intimate Partner Violence:   . Fear of Current or Ex-Partner: Not on file  . Emotionally Abused: Not on file  . Physically Abused: Not on file  . Sexually Abused: Not on file      Review of systems: All other review of systems negative except as mentioned in the HPI.   Physical Exam: Vitals:   05/06/19 1004  BP: 130/60  Pulse: 80  Temp: 98.4 F (36.9 C)   Body mass index is 30.56 kg/m. Gen:      No acute distress Abdomen: Soft, no distention, nontender, no fluid thrill. Lower extremity: No edema Neuro: alert and oriented x 3, no asterixis Psych: normal mood and affect  Data Reviewed:  Reviewed labs, radiology imaging, old records and pertinent past GI work up   Assessment and Plan/Recommendations:  79 year old female with history of breast CA, splenic artery aneurysm and  cirrhosis likely secondary to NASH  Will need to exclude any other etiology for underlying chronic liver disease Check hepatitis A Ab, HBsAg, HBsAb and Hep C Ab ANA, AMA, anti-smooth muscle antibody, alpha-1 antitrypsin level and ceruloplasmin level  Check AFP and abdominal ultrasound for HCC screening  Due for esophageal varices screening  No evidence of ascites or peripheral edema on exam  No asterixis or findings to suggest hepatic encephalopathy Check ammonia level to establish a baseline  Continue with diet and exercise, avoid alcohol, herbal remedies, sugars or simple  carbohydrates Maintain good glycemic control Patient wants to discontinue Metformin, advised her to discuss with PMD Dr. Rockwell [place]  Discussed colonoscopy for colorectal cancer screening, patient wants to discuss with her family before proceeding with it.  She is average risk.  No history of polyps or family history of colon cancer  GERD: Continue omeprazole Continue antireflux measures  The patient was provided an opportunity to ask questions and all were answered. The patient agreed with the plan and demonstrated an understanding of the instructions.  Damaris Hippo , MD   CC: Antony Contras, MD

## 2019-05-06 NOTE — Patient Instructions (Signed)
If you are age 78 or older, your body mass index should be between 23-30. Your Body mass index is 30.56 kg/m. If this is out of the aforementioned range listed, please consider follow up with your Primary Care Provider.  If you are age 63 or younger, your body mass index should be between 19-25. Your Body mass index is 30.56 kg/m. If this is out of the aformentioned range listed, please consider follow up with your Primary Care Provider.   Your provider has requested that you go to the basement level for lab work before leaving today. Press "B" on the elevator. The lab is located at the first door on the left as you exit the elevator.  You have been scheduled for an abdominal ultrasound at [facility] (1st floor of hospital) on Thursday 05/12/19 at 9 am. Please arrive 15 minutes prior to your appointment for registration. Make certain not to have anything to eat or drink 6 hours prior to your appointment. Should you need to reschedule your appointment, please contact radiology at [phone]. This test typically takes about 30 minutes to perform.  You have been scheduled for an endoscopy. Please follow written instructions given to you at your visit today. If you use inhalers (even only as needed), please bring them with you on the day of your procedure.

## 2019-05-12 ENCOUNTER — Other Ambulatory Visit: Payer: Self-pay

## 2019-05-12 ENCOUNTER — Ambulatory Visit (HOSPITAL_COMMUNITY)
Admission: RE | Admit: 2019-05-12 | Discharge: 2019-05-12 | Disposition: A | Discharge status: Home / Self Care | Payer: Medicare Other | Source: Ambulatory Visit | Attending: Gastroenterology | Admitting: Gastroenterology

## 2019-05-12 DIAGNOSIS — K7460 Unspecified cirrhosis of liver: Secondary | ICD-10-CM | POA: Diagnosis present

## 2019-05-12 DIAGNOSIS — K7581 Nonalcoholic steatohepatitis (NASH): Secondary | ICD-10-CM | POA: Diagnosis present

## 2019-05-12 LAB — HEPATITIS C ANTIBODY
Hepatitis C Ab: NONREACTIVE
SIGNAL TO CUT-OFF: 0.15 (ref <1.00)

## 2019-05-12 LAB — AFP TUMOR MARKER: AFP-Tumor Marker: 4.6 ng/mL

## 2019-05-12 LAB — CERULOPLASMIN: Ceruloplasmin: 27 mg/dL (ref 18–53)

## 2019-05-12 LAB — ANA: Anti Nuclear Antibody (ANA): POSITIVE — AB

## 2019-05-12 LAB — ANTI-NUCLEAR AB-TITER (ANA TITER): ANA Titer 1: 1:40 {titer} — ABNORMAL HIGH

## 2019-05-12 LAB — MITOCHONDRIAL ANTIBODIES: Mitochondrial M2 Ab, IgG: <=20 U

## 2019-05-12 LAB — ALPHA-1-ANTITRYPSIN: A-1 Antitrypsin, Ser: 139 mg/dL (ref 83–199)

## 2019-05-12 LAB — HEPATITIS B SURFACE ANTIGEN: Hepatitis B Surface Ag: NONREACTIVE

## 2019-05-12 LAB — ANTI-SMOOTH MUSCLE ANTIBODY, IGG: Actin (Smooth Muscle) Antibody (IGG): <20 U (ref <20)

## 2019-05-12 LAB — HEPATITIS A ANTIBODY, TOTAL: Hepatitis A AB,Total: REACTIVE — AB

## 2019-05-12 LAB — HEPATITIS B SURFACE ANTIBODY,QUALITATIVE: Hep B S Ab: BORDERLINE — AB

## 2019-05-12 IMAGING — US US ABDOMEN COMPLETE
2 series · 13 of 25 positions shown · non-contrast
Comparison: CT [DATE].  Ultrasound [DATE].

CLINICAL DATA: Cirrhosis.  HCC screening.

EXAM:
ABDOMEN ULTRASOUND COMPLETE

[Series 1: us abdomen complete · 12 of 80 slices shown (1 of 2)]
[im 1/80]
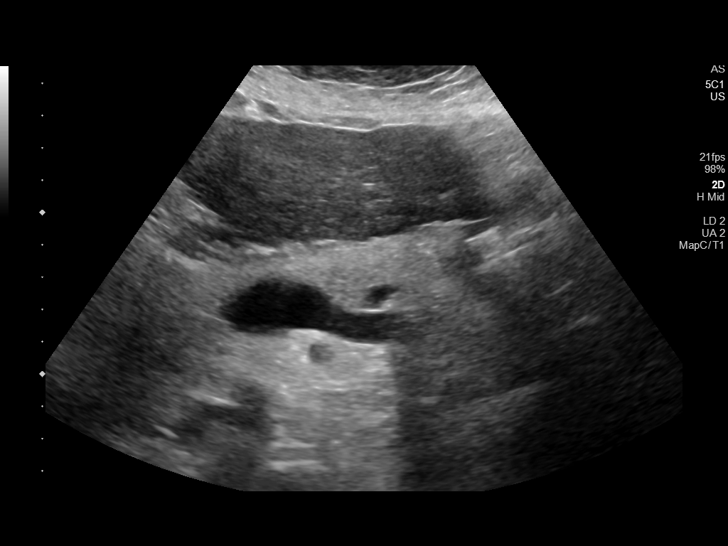
[im 7/80]
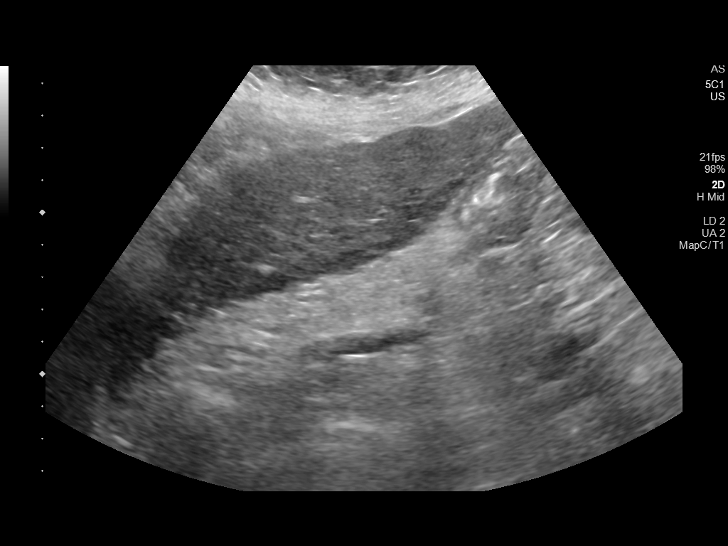
[im 14/80]
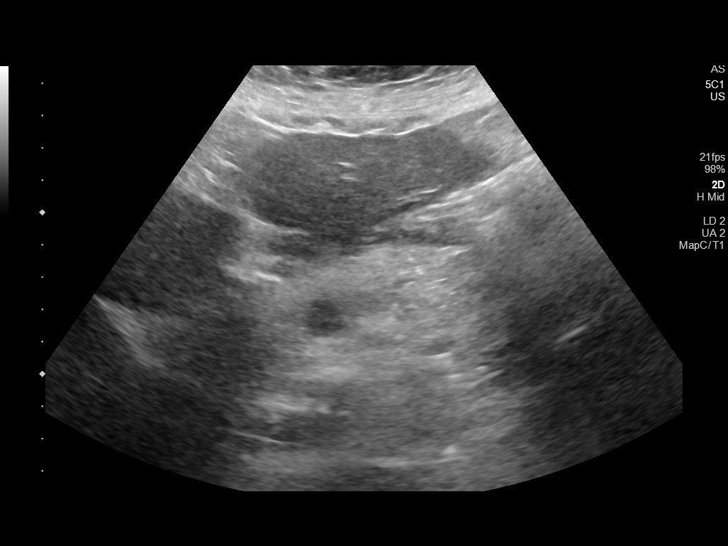
[im 21/80]
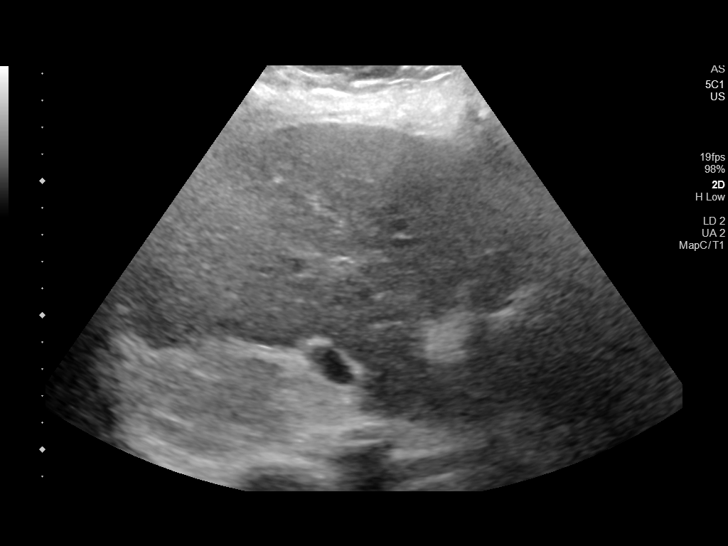
[im 28/80]
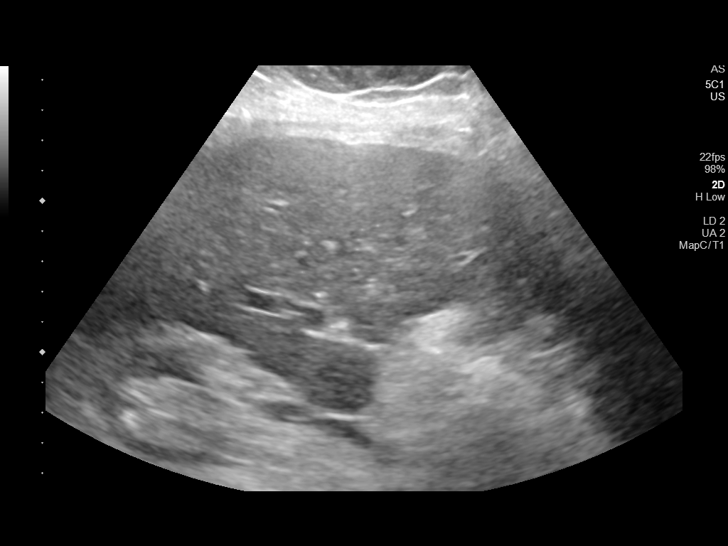
[im 35/80]
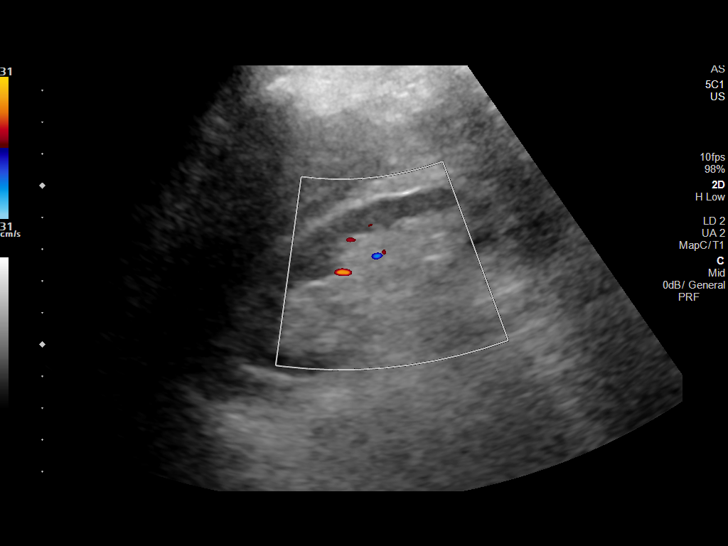
[im 42/80]
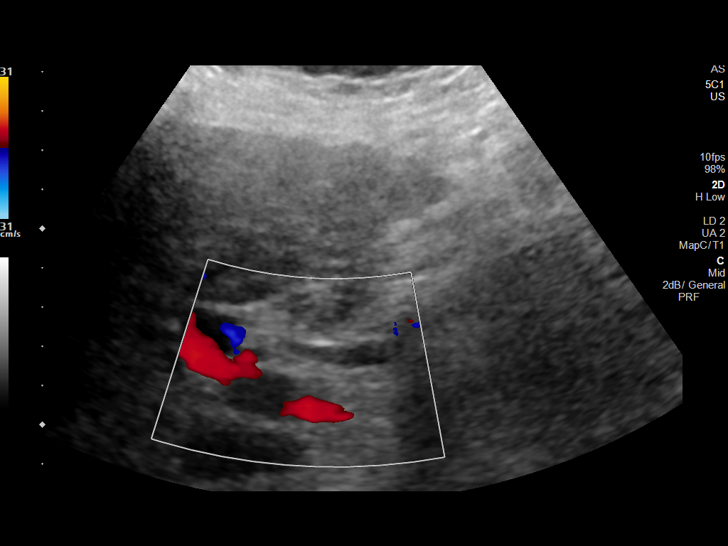
[im 49/80]
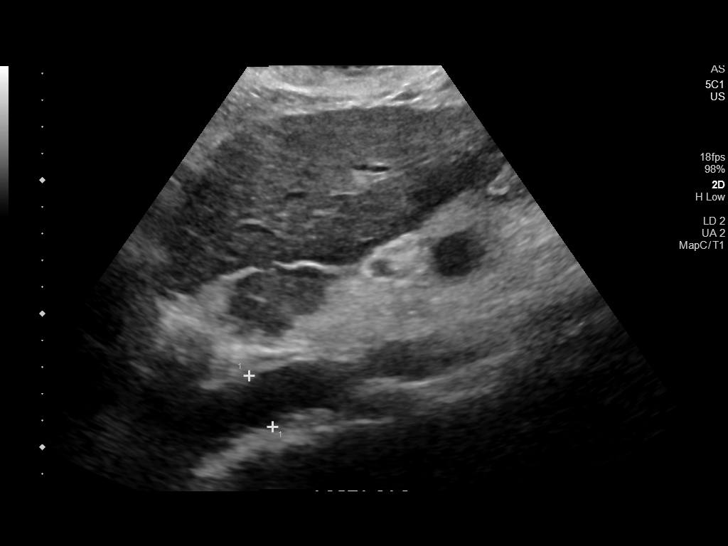
[im 55/80]
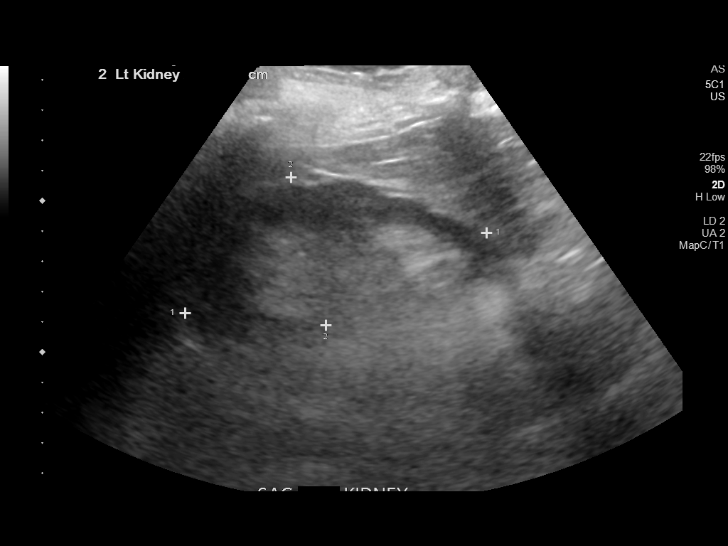
[im 62/80]
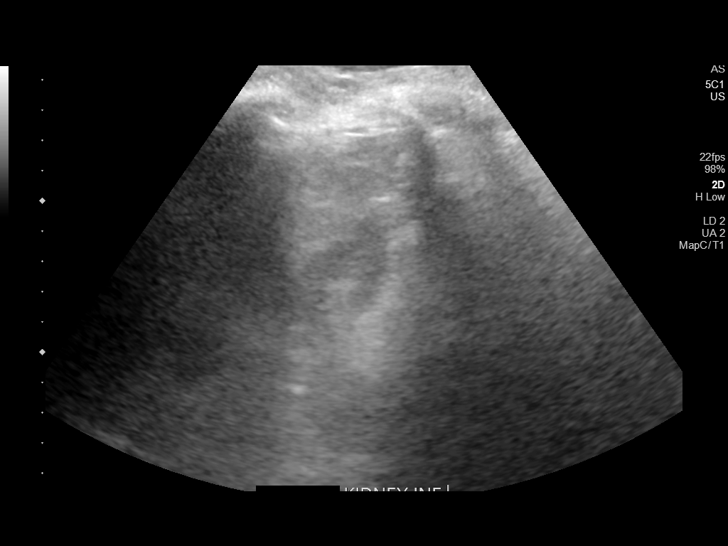
[im 69/80]
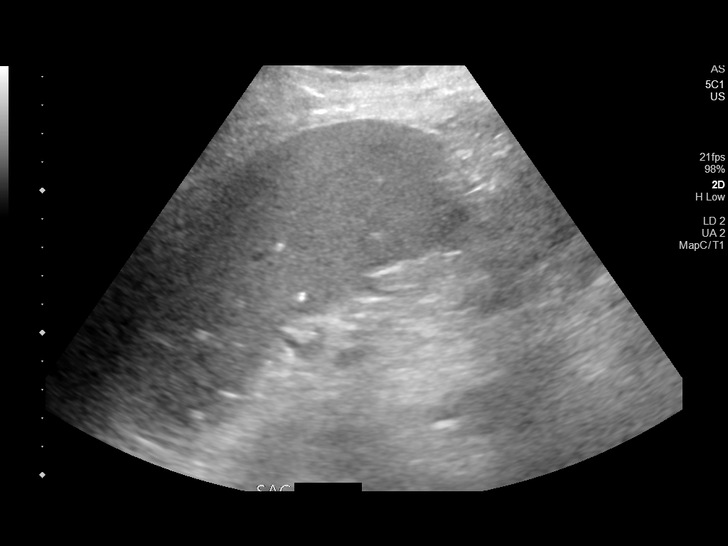
[im 76/80]
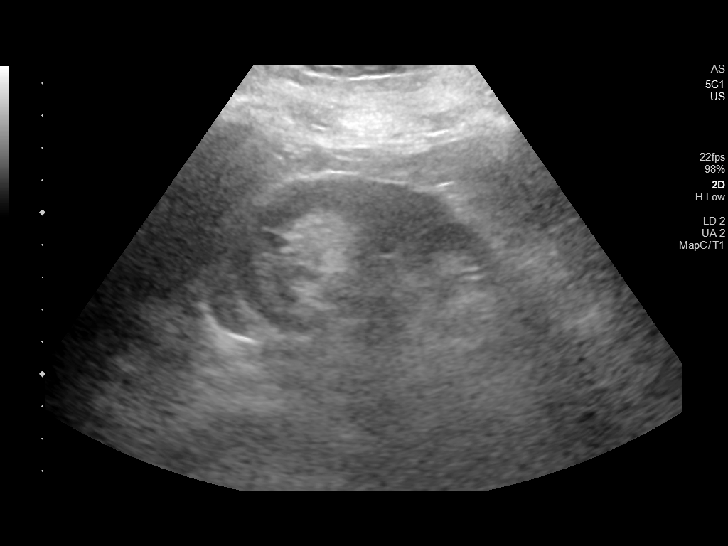

[Series 1001: us abdomen complete · 1 of 3 slices shown (2 of 2)]
[im 1/3]
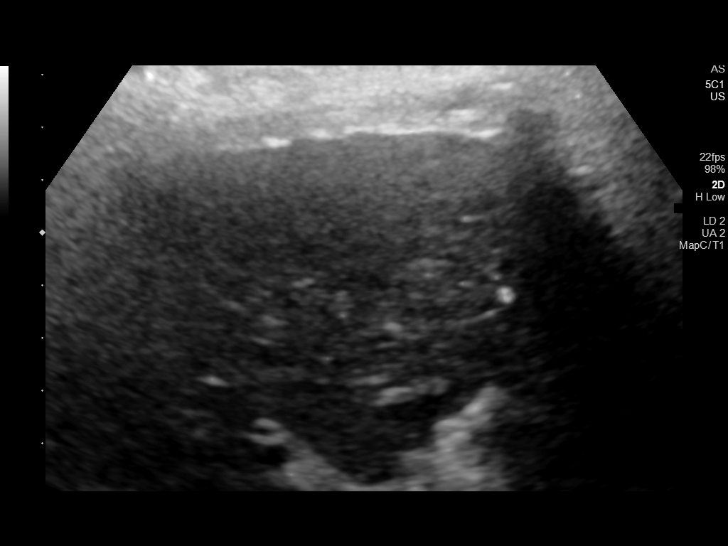

[13 of 25 positions shown; findings below may reference images not displayed]

FINDINGS: Gallbladder: Cholecystectomy.

Common bile duct: Diameter: 1.9 mm.

Liver: Coarse hepatic echotexture consistent patient's known
cirrhosis. No focal mass lesion. Portal vein is patent on color
Doppler imaging with normal direction of blood flow towards the
liver.

IVC: No abnormality visualized.

Pancreas: Visualized portion unremarkable.

Spleen: Spleen is normal size at 10.2 cm. Echogenic foci noted most
likely calcified granulomas.

Right Kidney: Length: 8.8 cm. Echogenicity within normal limits. No
mass or hydronephrosis visualized.

Left Kidney: Length: 10.3 cm. Echogenicity within normal limits. No
mass or hydronephrosis visualized.

Abdominal aorta: No aneurysm visualized.

Other findings: None.
IMPRESSION: 1.  Cholecystectomy.  No biliary distention.

2. Coarse hepatic echotexture consistent patient's known cirrhosis.
No focal mass lesion noted. Portal vein is patent with normal
direction of flow. Spleen normal in size.

## 2019-05-24 ENCOUNTER — Encounter: Payer: Self-pay | Admitting: Gastroenterology

## 2019-05-27 ENCOUNTER — Other Ambulatory Visit: Payer: Self-pay | Admitting: Family Medicine

## 2019-05-27 DIAGNOSIS — N6310 Unspecified lump in the right breast, unspecified quadrant: Secondary | ICD-10-CM

## 2019-06-08 ENCOUNTER — Telehealth: Payer: Self-pay | Admitting: Gastroenterology

## 2019-06-08 ENCOUNTER — Other Ambulatory Visit: Payer: Self-pay

## 2019-06-08 NOTE — Telephone Encounter (Signed)
Left message on the patient's phone. Asked she come to the [place] office 3rd floor tomorrow morning 8:30am for her instructions. Caryl Asp, CMA will review these with her and get a new signed consent form. Thank you for the help Heather.

## 2019-06-08 NOTE — Telephone Encounter (Signed)
Catherine Decker I was going to just send the patient instructions, but it looks a little more complicated. Please look at it. I am not sure an ambulatory referral was sent for both procedures. She is scheduled for EGD/colon 06/13/19. Thanks

## 2019-06-08 NOTE — Telephone Encounter (Signed)
Patient scheduled for EGD/Colon Monday 06/13/19.  Pt was only give EGD instructions.  She needs colon prep instructions and the prep called into her pharmacy.

## 2019-06-09 ENCOUNTER — Ambulatory Visit (INDEPENDENT_AMBULATORY_CARE_PROVIDER_SITE_OTHER): Payer: Medicare Other

## 2019-06-09 ENCOUNTER — Other Ambulatory Visit: Payer: Self-pay | Admitting: Gastroenterology

## 2019-06-09 ENCOUNTER — Other Ambulatory Visit: Payer: Self-pay

## 2019-06-09 DIAGNOSIS — K766 Portal hypertension: Secondary | ICD-10-CM

## 2019-06-09 DIAGNOSIS — Z1211 Encounter for screening for malignant neoplasm of colon: Secondary | ICD-10-CM

## 2019-06-09 DIAGNOSIS — Z1159 Encounter for screening for other viral diseases: Secondary | ICD-10-CM

## 2019-06-09 DIAGNOSIS — K219 Gastro-esophageal reflux disease without esophagitis: Secondary | ICD-10-CM

## 2019-06-09 LAB — SARS CORONAVIRUS 2 (TAT 6-24 HRS): SARS Coronavirus 2: NEGATIVE

## 2019-06-09 NOTE — Telephone Encounter (Signed)
Called the patient again. No answer. Called the daughter. She will call her mother and ask her to call us. Explained that we are wanting her to come to [place] for the instructions today.

## 2019-06-09 NOTE — Telephone Encounter (Signed)
Instructions printed and new consent signed by patient.

## 2019-06-09 NOTE — Telephone Encounter (Signed)
Patient contacted. She can be here today by 3 pm.

## 2019-06-13 ENCOUNTER — Ambulatory Visit (AMBULATORY_SURGERY_CENTER): Payer: Medicare Other | Admitting: Gastroenterology

## 2019-06-13 ENCOUNTER — Encounter: Payer: Self-pay | Admitting: Gastroenterology

## 2019-06-13 ENCOUNTER — Other Ambulatory Visit: Payer: Self-pay

## 2019-06-13 VITALS — BP 112/82 | HR 71 | Temp 97.7°F | Resp 27 | Ht 63.0 in | Wt 172.0 lb

## 2019-06-13 DIAGNOSIS — K449 Diaphragmatic hernia without obstruction or gangrene: Secondary | ICD-10-CM

## 2019-06-13 DIAGNOSIS — K2950 Unspecified chronic gastritis without bleeding: Secondary | ICD-10-CM | POA: Diagnosis not present

## 2019-06-13 DIAGNOSIS — K7581 Nonalcoholic steatohepatitis (NASH): Secondary | ICD-10-CM

## 2019-06-13 DIAGNOSIS — D122 Benign neoplasm of ascending colon: Secondary | ICD-10-CM | POA: Diagnosis not present

## 2019-06-13 DIAGNOSIS — K2100 Gastro-esophageal reflux disease with esophagitis, without bleeding: Secondary | ICD-10-CM

## 2019-06-13 DIAGNOSIS — Z1211 Encounter for screening for malignant neoplasm of colon: Secondary | ICD-10-CM | POA: Diagnosis not present

## 2019-06-13 DIAGNOSIS — K7460 Unspecified cirrhosis of liver: Secondary | ICD-10-CM

## 2019-06-13 DIAGNOSIS — K766 Portal hypertension: Secondary | ICD-10-CM

## 2019-06-13 DIAGNOSIS — K2990 Gastroduodenitis, unspecified, without bleeding: Secondary | ICD-10-CM

## 2019-06-13 DIAGNOSIS — K2970 Gastritis, unspecified, without bleeding: Secondary | ICD-10-CM

## 2019-06-13 MED ORDER — SODIUM CHLORIDE 0.9 % IV SOLN: 500.0000 mL | Freq: Once | INTRAVENOUS | Status: DC

## 2019-06-13 NOTE — Progress Notes (Signed)
A and O x3. Report to RN. Tolerated MAC anesthesia well.Teeth unchanged after procedure.

## 2019-06-13 NOTE — Progress Notes (Signed)
Called to room to assist during endoscopic procedure.  Patient ID and intended procedure confirmed with present staff. Received instructions for my participation in the procedure from the performing physician.  

## 2019-06-13 NOTE — Op Note (Signed)
[facility] Patient Name: Catherine Decker Procedure Date: 06/13/2019 1:20 PM MRN: [id number] Endoscopist: Mauri Pole , MD Age: 79 Referring MD:  Date of Birth: February 07, 1941 Gender: Female Account #: [id number] Procedure:                Colonoscopy Indications:              Screening for colorectal malignant neoplasm Medicines:                Monitored Anesthesia Care Procedure:                Pre-Anesthesia Assessment:                           - Prior to the procedure, a History and Physical                            was performed, and patient medications and                            allergies were reviewed. The patient's tolerance of                            previous anesthesia was also reviewed. The risks                            and benefits of the procedure and the sedation                            options and risks were discussed with the patient.                            All questions were answered, and informed consent                            was obtained. Prior Anticoagulants: The patient has                            taken no previous anticoagulant or antiplatelet                            agents. ASA Grade Assessment: III - A patient with                            severe systemic disease. After reviewing the risks                            and benefits, the patient was deemed in                            satisfactory condition to undergo the procedure.                           After obtaining informed consent, the colonoscope  was passed under direct vision. Throughout the                            procedure, the patient's blood pressure, pulse, and                            oxygen saturations were monitored continuously. The                            Colonoscope was introduced through the anus and                            advanced to the the cecum, identified by                            appendiceal orifice  and ileocecal valve. The                            colonoscopy was performed without difficulty. The                            patient tolerated the procedure well. The quality                            of the bowel preparation was good. The ileocecal                            valve, appendiceal orifice, and rectum were                            photographed. Scope In: 1:37:32 PM Scope Out: 1:58:49 PM Scope Withdrawal Time: 0 hours 12 minutes 18 seconds  Total Procedure Duration: 0 hours 21 minutes 17 seconds  Findings:                 The perianal and digital rectal examinations were                            normal.                           A 12 mm polyp was found in the ascending colon. The                            polyp was sessile. The polyp was removed with a                            cold snare. Resection and retrieval were complete.                           Multiple small and large-mouthed diverticula were                            found in the sigmoid colon. There was no evidence  of diverticular bleeding.                           Non-bleeding internal hemorrhoids were found during                            retroflexion. The hemorrhoids were medium-sized.                           The exam was otherwise without abnormality. Complications:            No immediate complications. Estimated Blood Loss:     Estimated blood loss was minimal. Impression:               - One 12 mm polyp in the ascending colon, removed                            with a cold snare. Resected and retrieved.                           - Moderate diverticulosis in the sigmoid colon.                            There was no evidence of diverticular bleeding.                           - Non-bleeding internal hemorrhoids.                           - The examination was otherwise normal. Recommendation:           - Patient has a contact number available for                             emergencies. The signs and symptoms of potential                            delayed complications were discussed with the                            patient. Return to normal activities tomorrow.                            Written discharge instructions were provided to the                            patient.                           - Resume previous diet.                           - Continue present medications.                           - Await pathology results.                           -  No repeat colonoscopy due to age.                           - Return to GI clinic in 3 months, please call                            office [phone]) to schedule appointment. Mauri Pole, MD 06/13/2019 2:08:35 PM This report has been signed electronically.

## 2019-06-13 NOTE — Progress Notes (Signed)
[place]

## 2019-06-13 NOTE — Op Note (Signed)
[facility] Patient Name: Jaclynne Baldo Procedure Date: 06/13/2019 1:21 PM MRN: [id number] Endoscopist: Mauri Pole , MD Age: 79 Referring MD:  Date of Birth: 30-May-1940 Gender: Female Account #: [id number] Procedure:                Upper GI endoscopy Indications:              To evaluate esophageal varices in patient with                            suspected portal hypertension Medicines:                Monitored Anesthesia Care Procedure:                Pre-Anesthesia Assessment:                           - Prior to the procedure, a History and Physical                            was performed, and patient medications and                            allergies were reviewed. The patient's tolerance of                            previous anesthesia was also reviewed. The risks                            and benefits of the procedure and the sedation                            options and risks were discussed with the patient.                            All questions were answered, and informed consent                            was obtained. Prior Anticoagulants: The patient has                            taken no previous anticoagulant or antiplatelet                            agents. ASA Grade Assessment: III - A patient with                            severe systemic disease. After reviewing the risks                            and benefits, the patient was deemed in                            satisfactory condition to undergo the procedure.  After obtaining informed consent, the endoscope was                            passed under direct vision. Throughout the                            procedure, the patient's blood pressure, pulse, and                            oxygen saturations were monitored continuously. The                            Endoscope was introduced through the mouth, and                            advanced to the second  part of duodenum. The upper                            GI endoscopy was accomplished without difficulty.                            The patient tolerated the procedure well. Scope In: Scope Out: Findings:                 LA Grade B (one or more mucosal breaks greater than                            5 mm, not extending between the tops of two mucosal                            folds) esophagitis with no bleeding was found 34 to                            35 cm from the incisors.                           A small hiatal hernia was present.                           No gross lesions were noted in the entire                            esophagus. No esophageal varices                           Patchy mild inflammation characterized by adherent                            blood, congestion (edema), erosions and erythema                            was found in the entire examined stomach suggestive  of portal hypertensive gastropathy. Biopsies were                            taken with a cold forceps for Helicobacter pylori                            testing.                           No gastric varices                           The examined duodenum was normal. Complications:            No immediate complications. Estimated Blood Loss:     Estimated blood loss was minimal. Impression:               - LA Grade B reflux esophagitis with no bleeding.                           - Small hiatal hernia.                           - No gross lesions in esophagus.                           - Gastritis. Biopsied.                           - Normal examined duodenum. Recommendation:           - Patient has a contact number available for                            emergencies. The signs and symptoms of potential                            delayed complications were discussed with the                            patient. Return to normal activities tomorrow.                             Written discharge instructions were provided to the                            patient.                           - Resume previous diet.                           - Continue present medications.                           - Await pathology results.                           -  See the other procedure note for documentation of                            additional recommendations. Mauri Pole, MD 06/13/2019 2:05:01 PM This report has been signed electronically.

## 2019-06-13 NOTE — Patient Instructions (Signed)
YOU HAD AN ENDOSCOPIC PROCEDURE TODAY AT THE [facility]:   Refer to the procedure report that was given to you for any specific questions about what was found during the examination.  If the procedure report does not answer your questions, please call your gastroenterologist to clarify.  If you requested that your care partner not be given the details of your procedure findings, then the procedure report has been included in a sealed envelope for you to review at your convenience later.  YOU SHOULD EXPECT: Some feelings of bloating in the abdomen. Passage of more gas than usual.  Walking can help get rid of the air that was put into your GI tract during the procedure and reduce the bloating. If you had a lower endoscopy (such as a colonoscopy or flexible sigmoidoscopy) you may notice spotting of blood in your stool or on the toilet paper. If you underwent a bowel prep for your procedure, you may not have a normal bowel movement for a few days.  Please Note:  You might notice some irritation and congestion in your nose or some drainage.  This is from the oxygen used during your procedure.  There is no need for concern and it should clear up in a day or so.  SYMPTOMS TO REPORT IMMEDIATELY:   Following lower endoscopy (colonoscopy or flexible sigmoidoscopy):  Excessive amounts of blood in the stool  Significant tenderness or worsening of abdominal pains  Swelling of the abdomen that is new, acute  Fever of 100F or higher   Following upper endoscopy (EGD)  Vomiting of blood or coffee ground material  New chest pain or pain under the shoulder blades  Painful or persistently difficult swallowing  New shortness of breath  Fever of 100F or higher  Black, tarry-looking stools  For urgent or emergent issues, a gastroenterologist can be reached at any hour by calling [phone]. Do not use MyChart messaging for urgent concerns.    DIET:  We do recommend a small meal at first, but  then you may proceed to your regular diet.  Drink plenty of fluids but you should avoid alcoholic beverages for 24 hours.  ACTIVITY:  You should plan to take it easy for the rest of today and you should NOT DRIVE or use heavy machinery until tomorrow (because of the sedation medicines used during the test).    FOLLOW UP: Our staff will call the number listed on your records 48-72 hours following your procedure to check on you and address any questions or concerns that you may have regarding the information given to you following your procedure. If we do not reach you, we will leave a message.  We will attempt to reach you two times.  During this call, we will ask if you have developed any symptoms of COVID 19. If you develop any symptoms (ie: fever, flu-like symptoms, shortness of breath, cough etc.) before then, please call [phone].  If you test positive for Covid 19 in the 2 weeks post procedure, please call and report this information to us.    If any biopsies were taken you will be contacted by phone or by letter within the next 1-3 weeks.  Please call us at [phone] if you have not heard about the biopsies in 3 weeks.    SIGNATURES/CONFIDENTIALITY: You and/or your care partner have signed paperwork which will be entered into your electronic medical record.  These signatures attest to the fact that that the information above on   your After Visit Summary has been reviewed and is understood.  Full responsibility of the confidentiality of this discharge information lies with you and/or your care-partner. 

## 2019-06-15 ENCOUNTER — Telehealth: Payer: Self-pay

## 2019-06-15 NOTE — Telephone Encounter (Signed)
Second attempt follow up to pt, pt vm full.

## 2019-06-15 NOTE — Telephone Encounter (Signed)
First attempt follow up call to pt, mailbox full.

## 2019-06-16 ENCOUNTER — Ambulatory Visit
Admission: RE | Admit: 2019-06-16 | Discharge: 2019-06-16 | Disposition: A | Discharge status: Home / Self Care | Payer: Medicare Other | Source: Ambulatory Visit | Attending: Family Medicine | Admitting: Family Medicine

## 2019-06-16 ENCOUNTER — Other Ambulatory Visit: Payer: Self-pay | Admitting: Family Medicine

## 2019-06-16 ENCOUNTER — Other Ambulatory Visit: Payer: Self-pay

## 2019-06-16 DIAGNOSIS — N6310 Unspecified lump in the right breast, unspecified quadrant: Secondary | ICD-10-CM

## 2019-06-16 HISTORY — DX: Malignant neoplasm of unspecified site of unspecified female breast: C50.919

## 2019-06-16 IMAGING — US US BREAST*R* LIMITED INC AXILLA
1 series · 11 of 11 positions shown · non-contrast
Comparison: Previous exam(s).

CLINICAL DATA: Mass felt by the patient in the 1 o'clock position
of the right breast for the past 2 months. Status post right
lumpectomy breast cancer in [ZE].

EXAM:
DIGITAL DIAGNOSTIC BILATERAL MAMMOGRAM WITH CAD AND TOMO
ULTRASOUND RIGHT BREAST

[Series 1: us breast*right* limited inc axilla · 0.06mm/px · 11 of 11 slices shown]
[im 1/11]
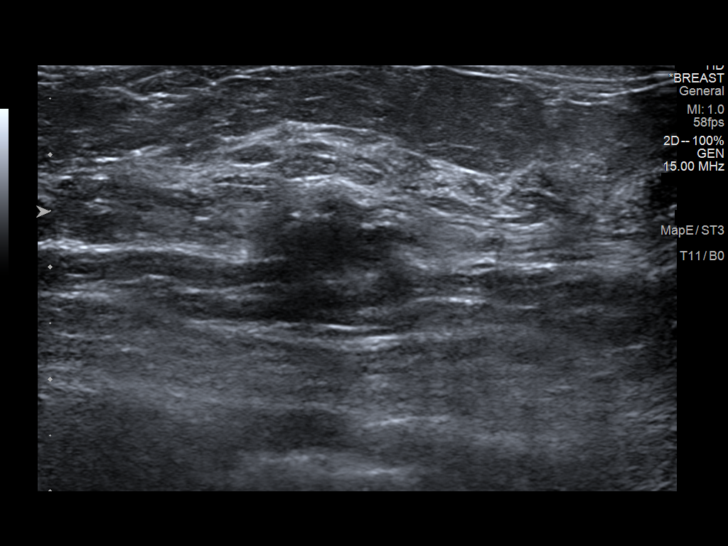
[im 2/11]
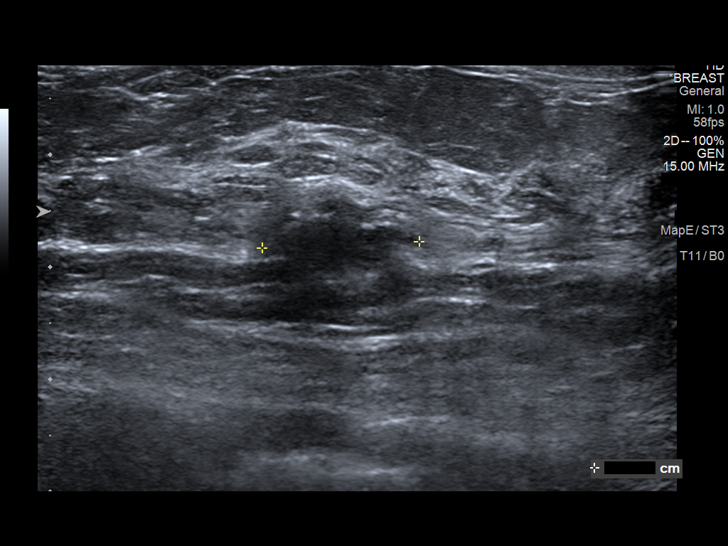
[im 3/11]
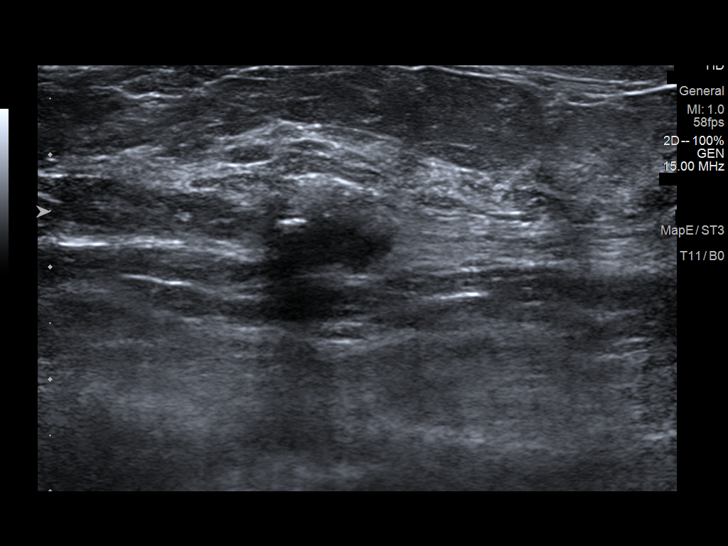
[im 4/11]
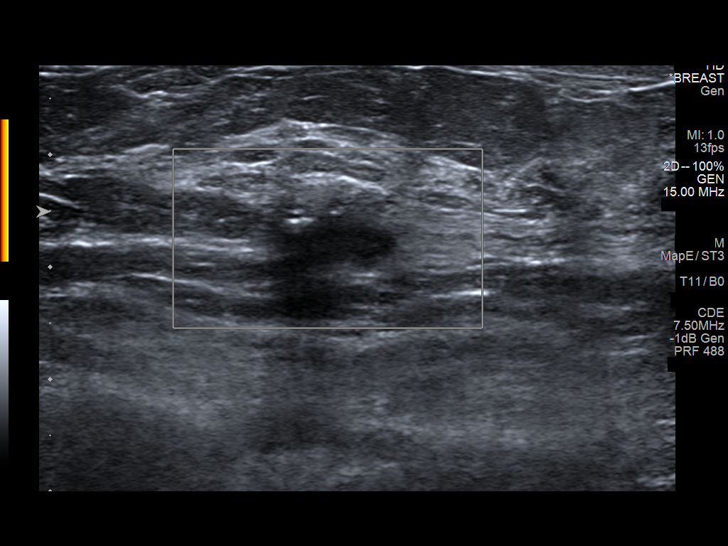
[im 5/11]
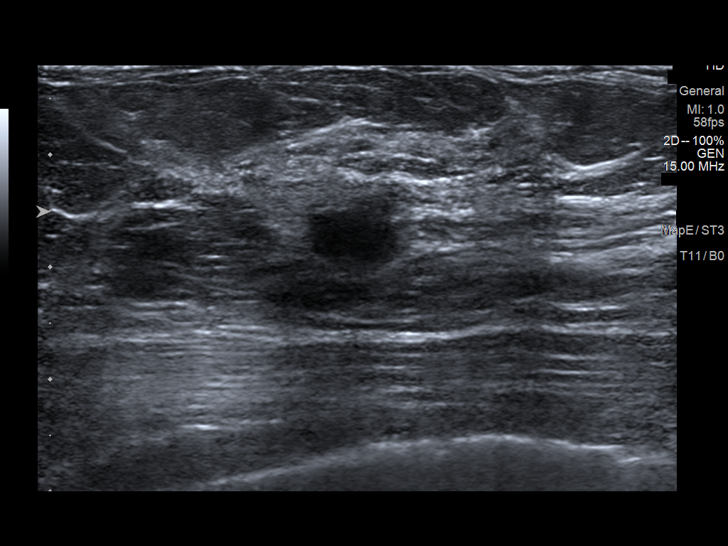
[im 6/11]
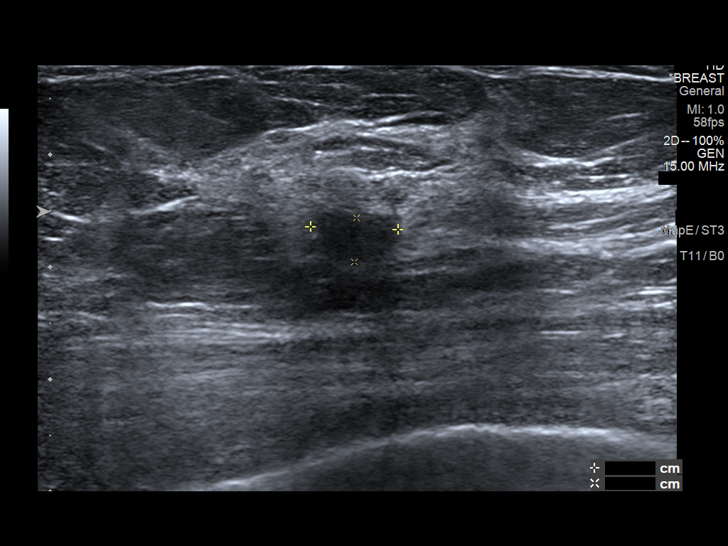
[im 7/11]
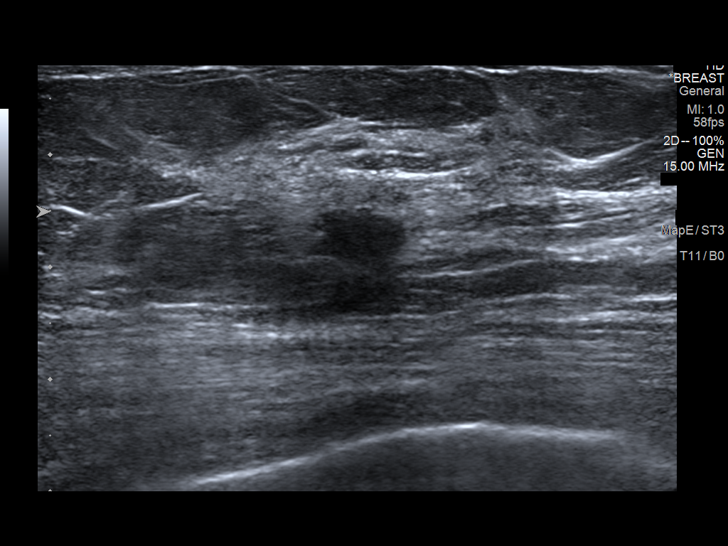
[im 8/11]
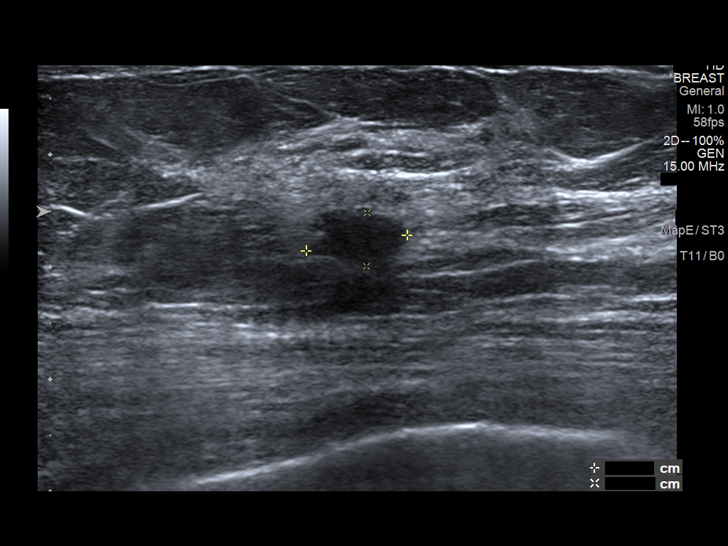
[im 9/11]
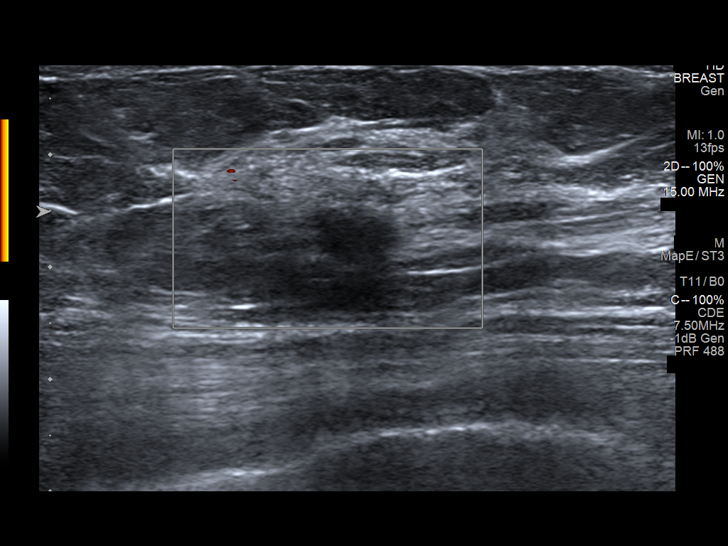
[im 10/11]
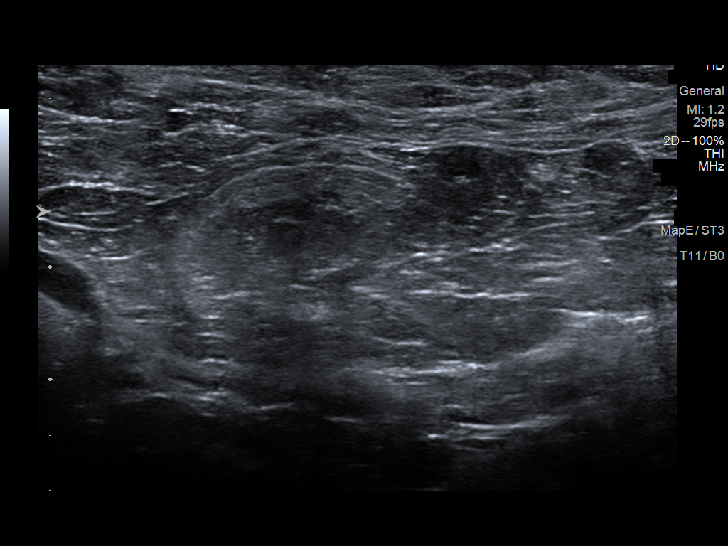
[im 11/11]
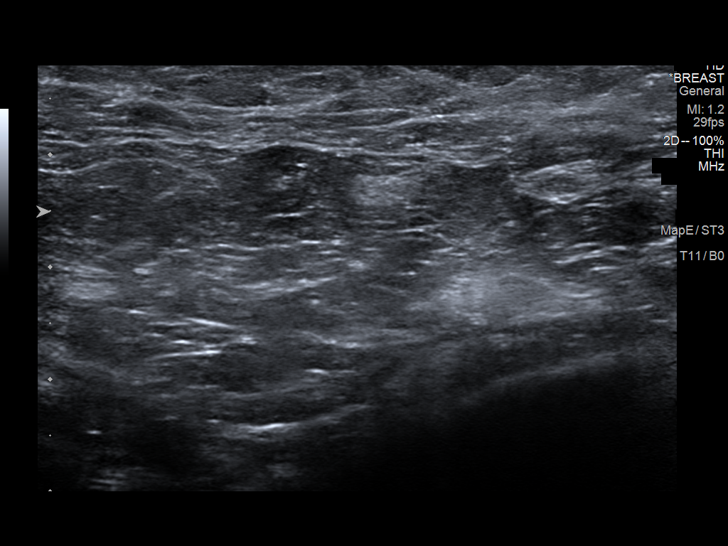

[11 of 11 positions shown; findings below may reference images not displayed]

ACR Breast Density Category c: The breast tissue is heterogeneously
dense, which may obscure small masses.
FINDINGS: Interval small spiculated mass with some coarse calcification in the
12 o'clock position of the right breast. This is in the region of
the mass felt by the patient, marked with a metallic marker. Stable
post lumpectomy changes the posterior aspect of the upper-outer
right breast. No interval findings on the left suspicious for
malignancy.

Mammographic images were processed with CAD.

On physical exam, patient has an approximately 1.5 cm palpable mass
in the 12 o'clock position of the right breast, 3 cm from the
nipple. There are no palpable right axillary lymph nodes.

Targeted ultrasound is performed, showing a 1.4 x 0.9 x 0.5 cm
irregular, hypoechoic mass in the 12 o'clock position of the right
breast, 3 cm from the nipple. This has ill-defined surrounding
increased echogenicity and corresponds to the palpable mass. A
coarse calcification is demonstrated at the anterior aspect of the
mass.

Ultrasound of the right axilla demonstrated no abnormal right
axillary lymph nodes.
IMPRESSION: 1. 1.4 cm mass in the 12 o'clock position of the right breast with
imaging features highly suspicious for malignancy.
2. No evidence of malignancy elsewhere in either breast.

RECOMMENDATION:
Ultrasound-guided core needle biopsy of the 1.4 cm mass in the 12
o'clock position of the right breast. This has been discussed with
the patient and scheduled at [DATE] p.m. on [DATE].

I have discussed the findings and recommendations with the patient.
If applicable, a reminder letter will be sent to the patient
regarding the next appointment.

BI-RADS CATEGORY  5: Highly suggestive of malignancy.

## 2019-06-16 IMAGING — MG DIGITAL DIAGNOSTIC BILAT W/ TOMO W/ CAD
6 of 10 series · 6 of 30 positions shown · non-contrast
Comparison: Previous exam(s).

CLINICAL DATA: Mass felt by the patient in the 1 o'clock position
of the right breast for the past 2 months. Status post right
lumpectomy breast cancer in [ZE].

EXAM:
DIGITAL DIAGNOSTIC BILATERAL MAMMOGRAM WITH CAD AND TOMO
ULTRASOUND RIGHT BREAST

[L CC synth-2D]
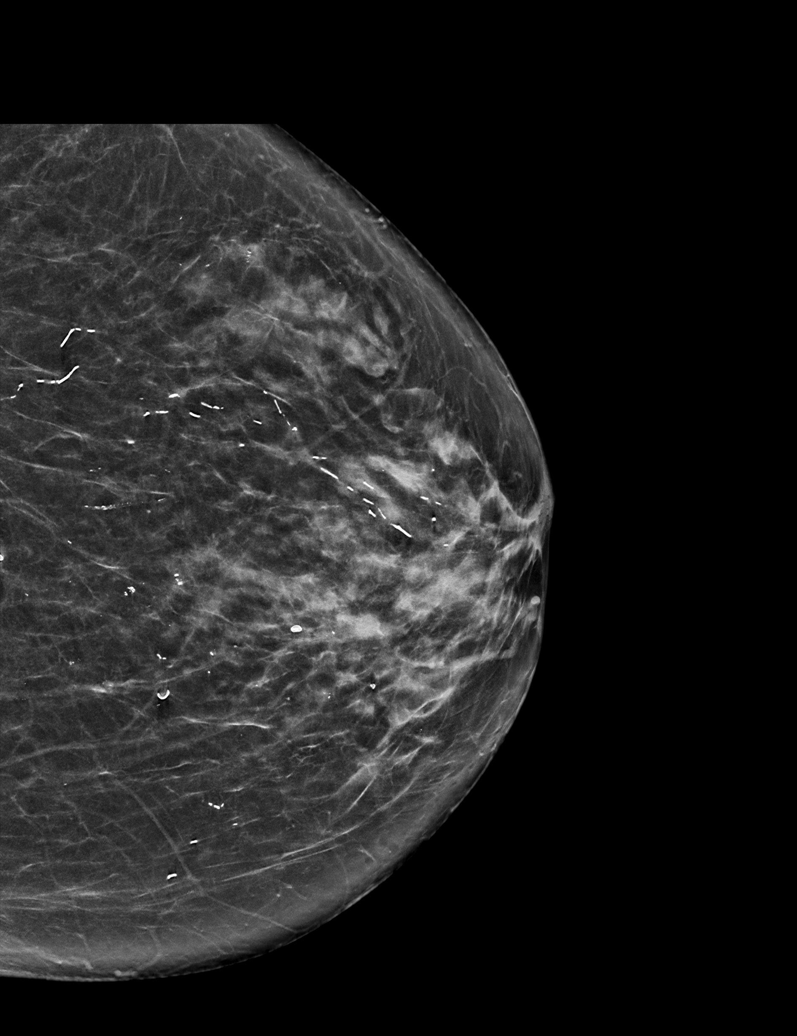

[R TAN synth-2D]
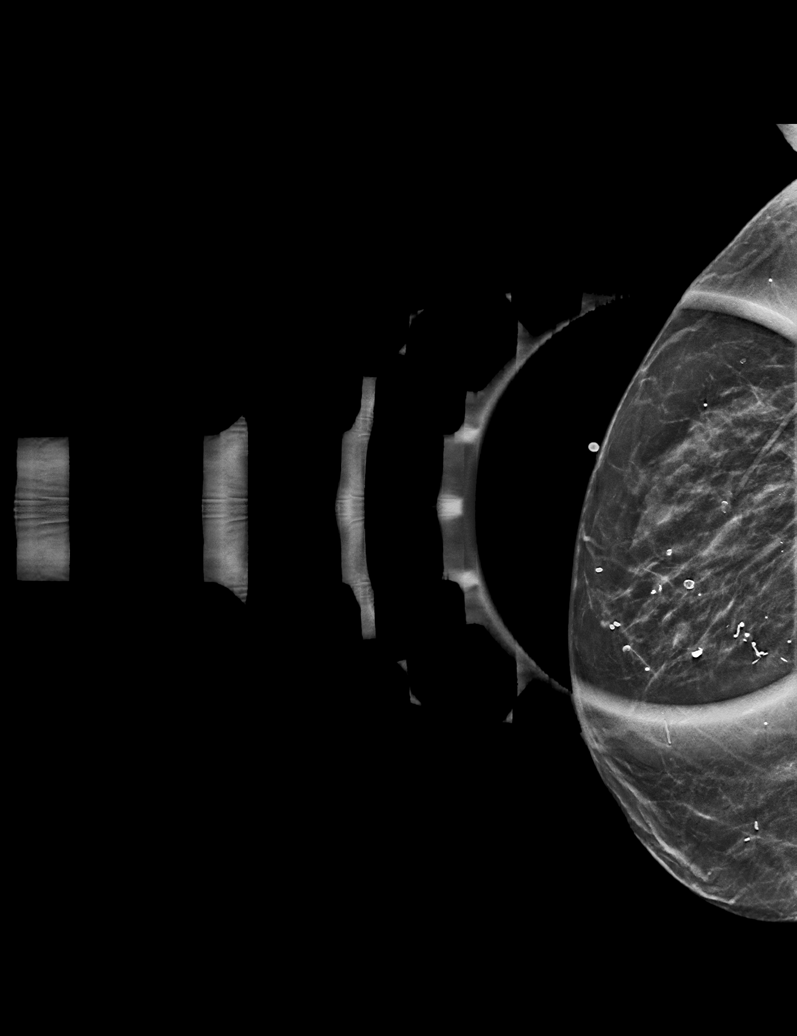

[R CC synth-2D]
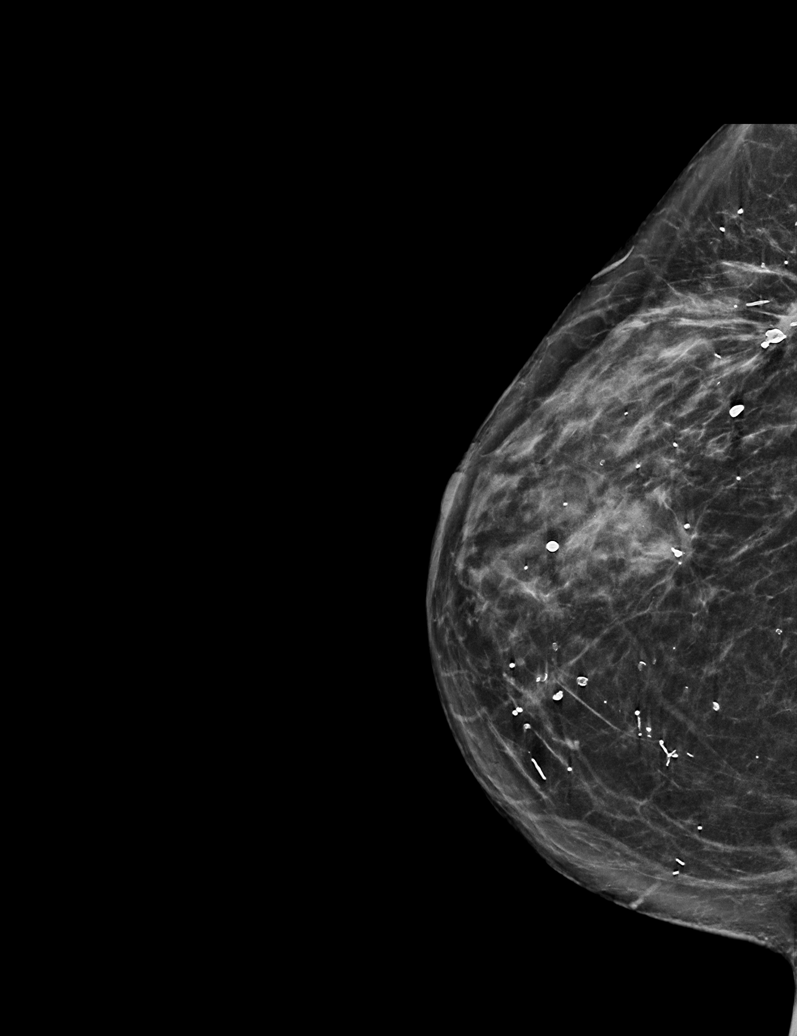

[L MLO synth-2D]
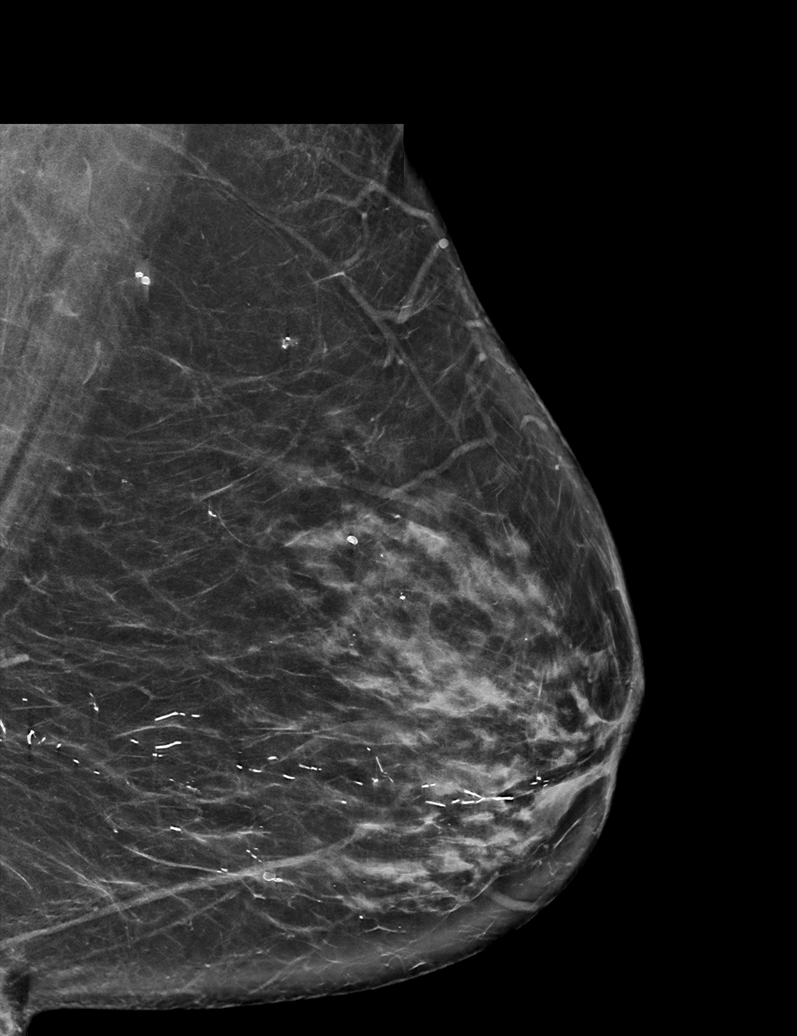

[R MLO synth-2D]
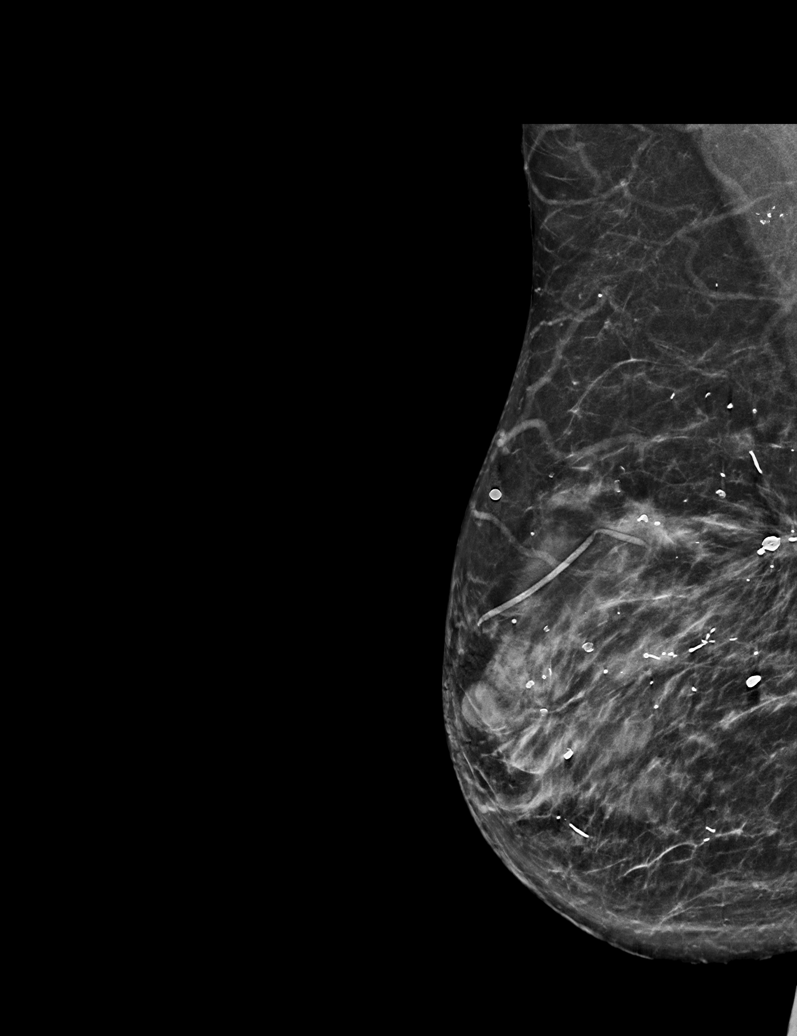

[R TAN tomo · tomo slice 23/44.0]
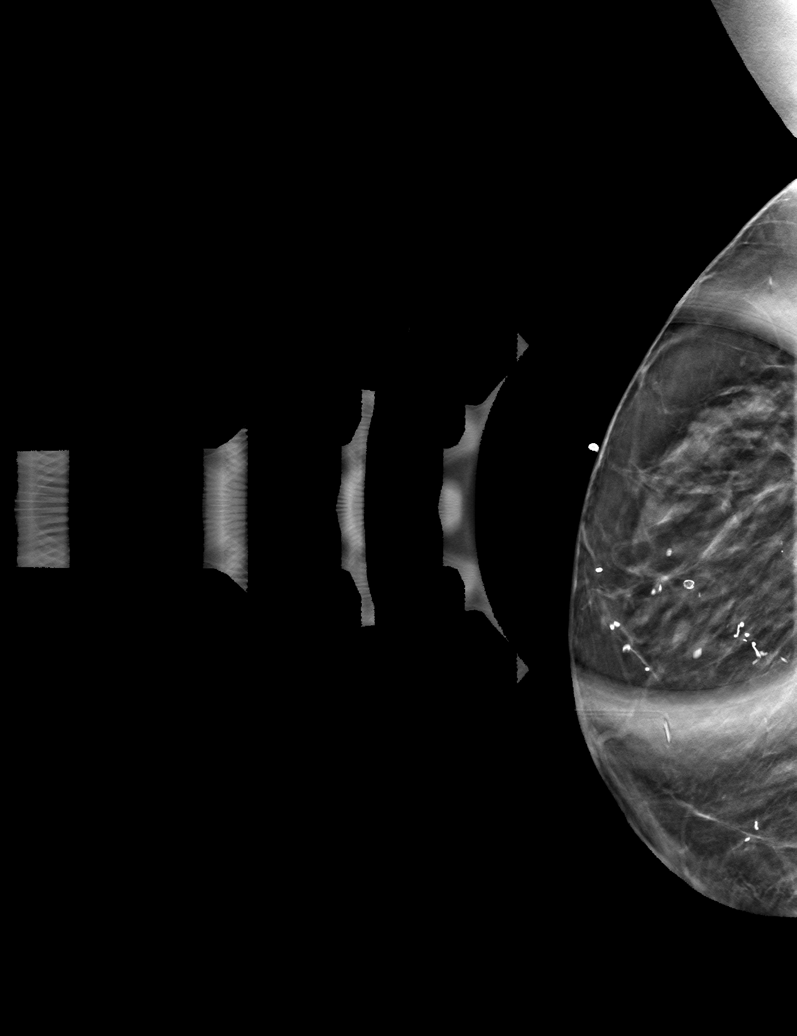

[6 of 30 positions shown; findings below may reference images not displayed]

ACR Breast Density Category c: The breast tissue is heterogeneously
dense, which may obscure small masses.
FINDINGS: Interval small spiculated mass with some coarse calcification in the
12 o'clock position of the right breast. This is in the region of
the mass felt by the patient, marked with a metallic marker. Stable
post lumpectomy changes the posterior aspect of the upper-outer
right breast. No interval findings on the left suspicious for
malignancy.

Mammographic images were processed with CAD.

On physical exam, patient has an approximately 1.5 cm palpable mass
in the 12 o'clock position of the right breast, 3 cm from the
nipple. There are no palpable right axillary lymph nodes.

Targeted ultrasound is performed, showing a 1.4 x 0.9 x 0.5 cm
irregular, hypoechoic mass in the 12 o'clock position of the right
breast, 3 cm from the nipple. This has ill-defined surrounding
increased echogenicity and corresponds to the palpable mass. A
coarse calcification is demonstrated at the anterior aspect of the
mass.

Ultrasound of the right axilla demonstrated no abnormal right
axillary lymph nodes.
IMPRESSION: 1. 1.4 cm mass in the 12 o'clock position of the right breast with
imaging features highly suspicious for malignancy.
2. No evidence of malignancy elsewhere in either breast.

RECOMMENDATION:
Ultrasound-guided core needle biopsy of the 1.4 cm mass in the 12
o'clock position of the right breast. This has been discussed with
the patient and scheduled at [DATE] p.m. on [DATE].

I have discussed the findings and recommendations with the patient.
If applicable, a reminder letter will be sent to the patient
regarding the next appointment.

BI-RADS CATEGORY  5: Highly suggestive of malignancy.

## 2019-06-22 ENCOUNTER — Encounter: Payer: Self-pay | Admitting: Gastroenterology

## 2019-06-24 ENCOUNTER — Other Ambulatory Visit: Payer: Self-pay

## 2019-06-24 ENCOUNTER — Ambulatory Visit
Admission: RE | Admit: 2019-06-24 | Discharge: 2019-06-24 | Disposition: A | Discharge status: Home / Self Care | Payer: Medicare Other | Source: Ambulatory Visit | Attending: Family Medicine | Admitting: Family Medicine

## 2019-06-24 DIAGNOSIS — N6310 Unspecified lump in the right breast, unspecified quadrant: Secondary | ICD-10-CM

## 2019-06-24 IMAGING — MG MM BREAST LOCALIZATION CLIP
4 series · 4 of 12 positions shown · non-contrast
Comparison: Previous exam(s).

CLINICAL DATA: Evaluate RIBBON clip placement following
ultrasound-guided RIGHT breast biopsy.

EXAM:
DIAGNOSTIC RIGHT MAMMOGRAM POST ULTRASOUND BIOPSY

[R CC synth-2D]
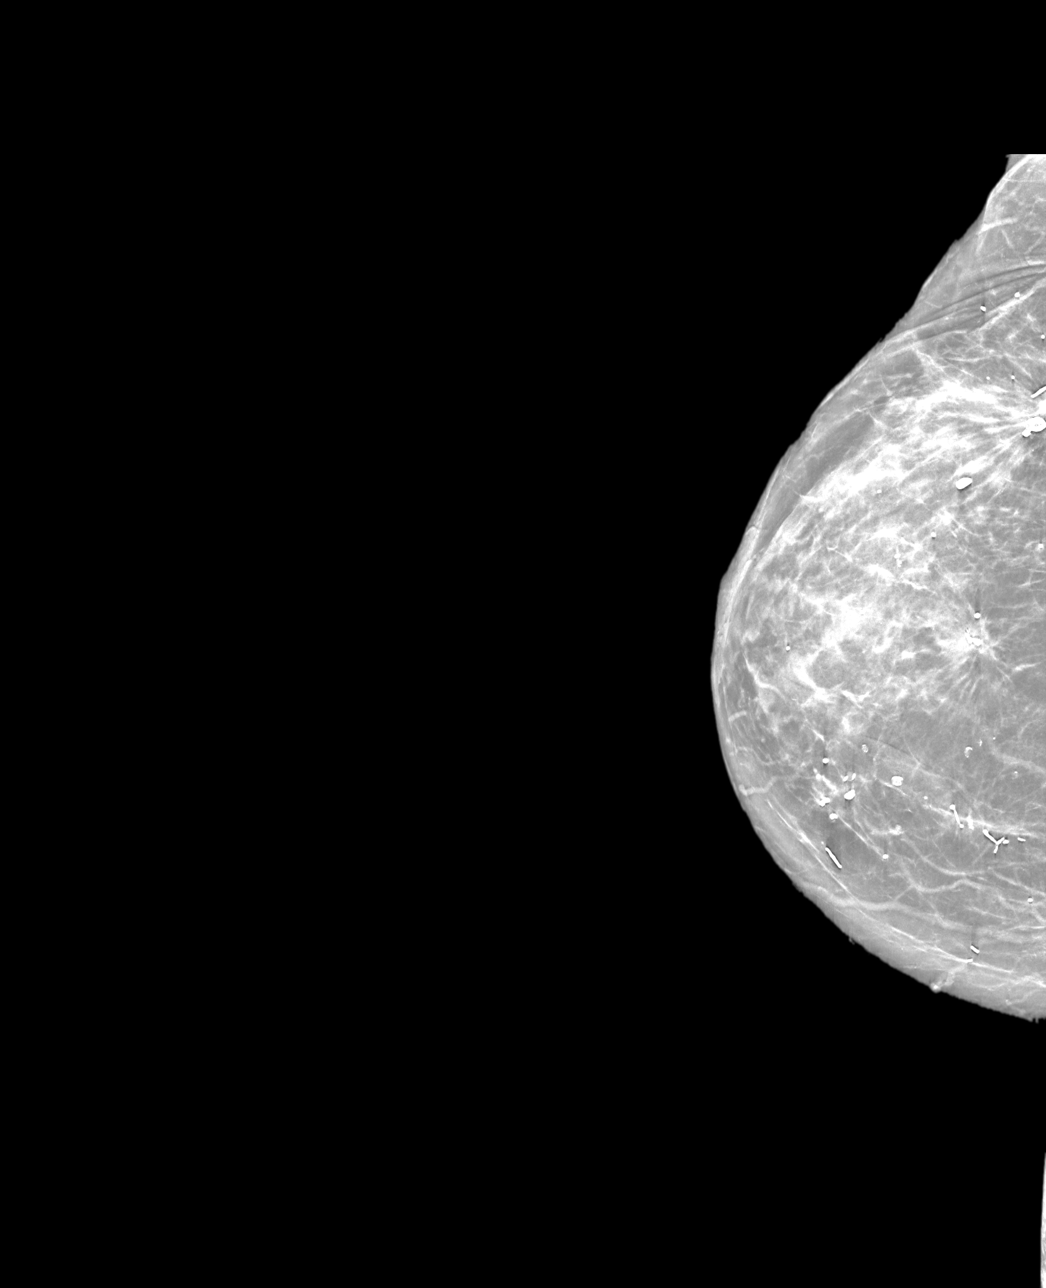

[R ML synth-2D]
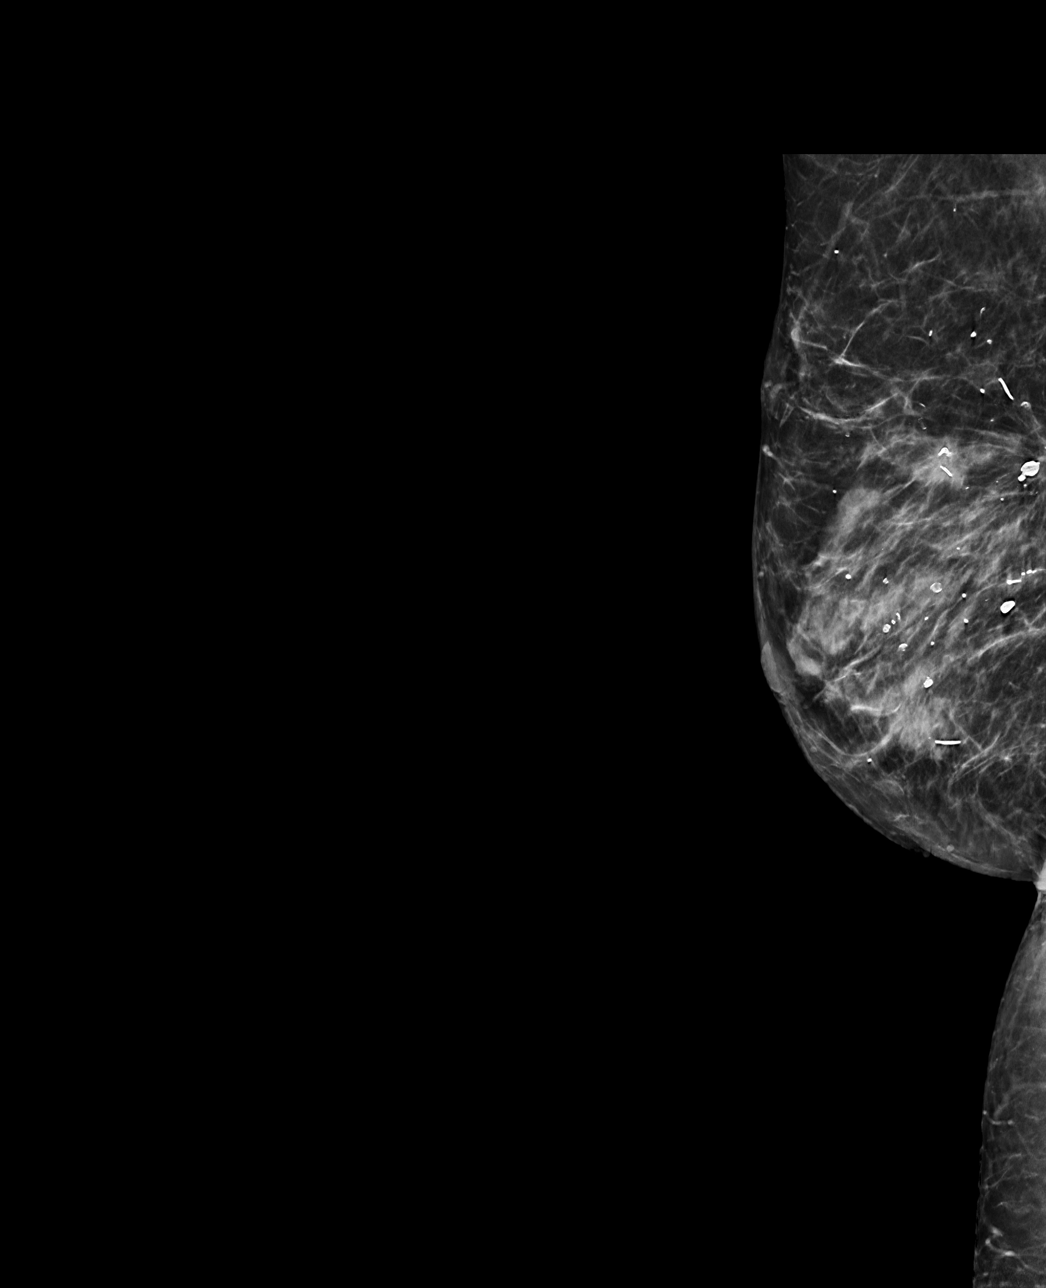

[R ML tomo · tomo slice 31/61.0]
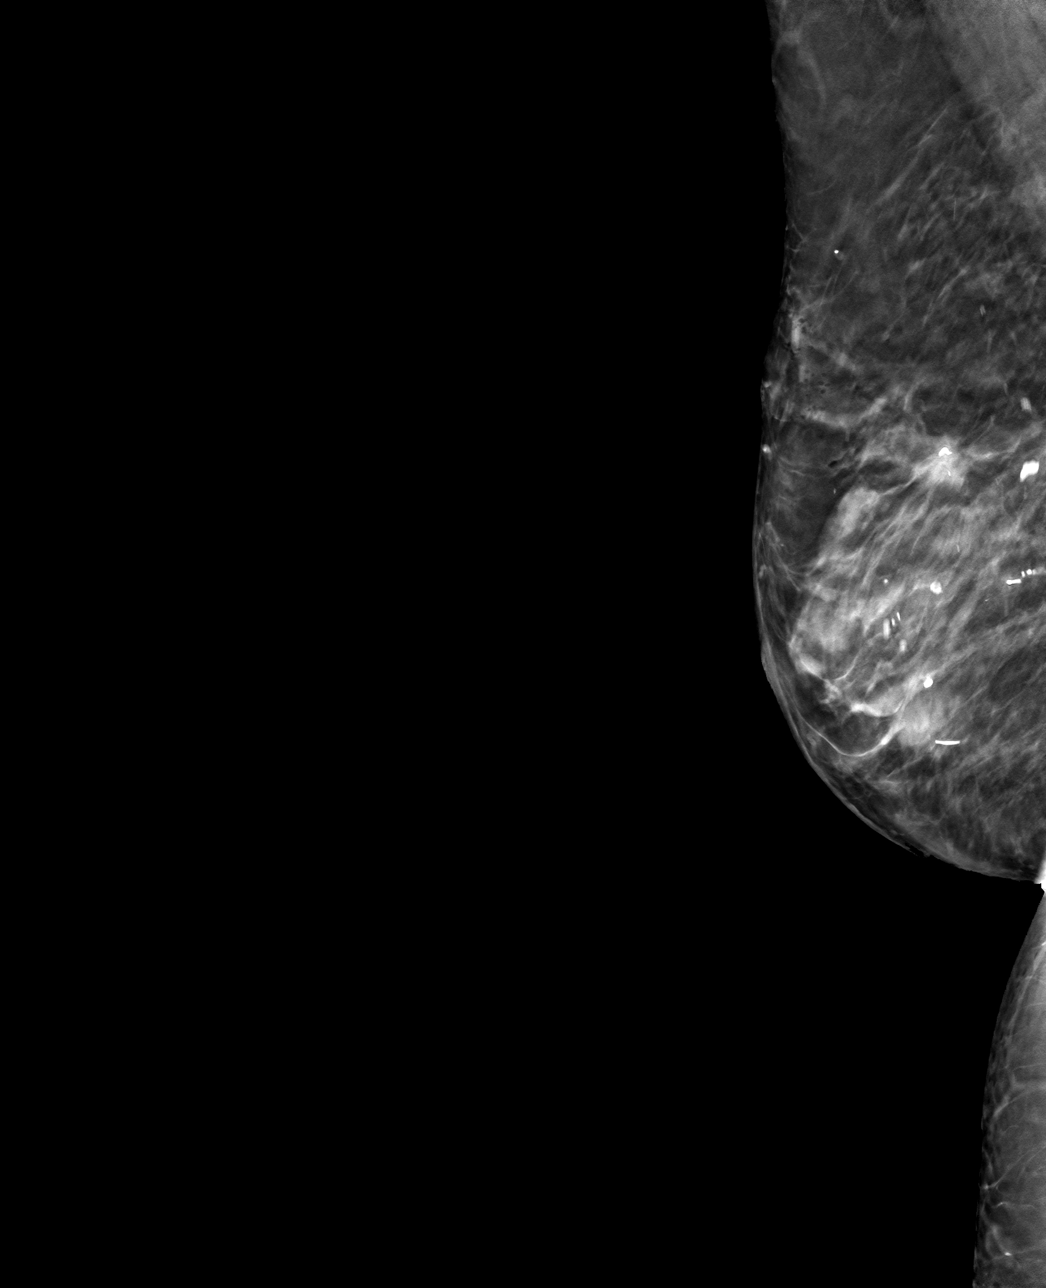

[R CC tomo · tomo slice 31/61.0]
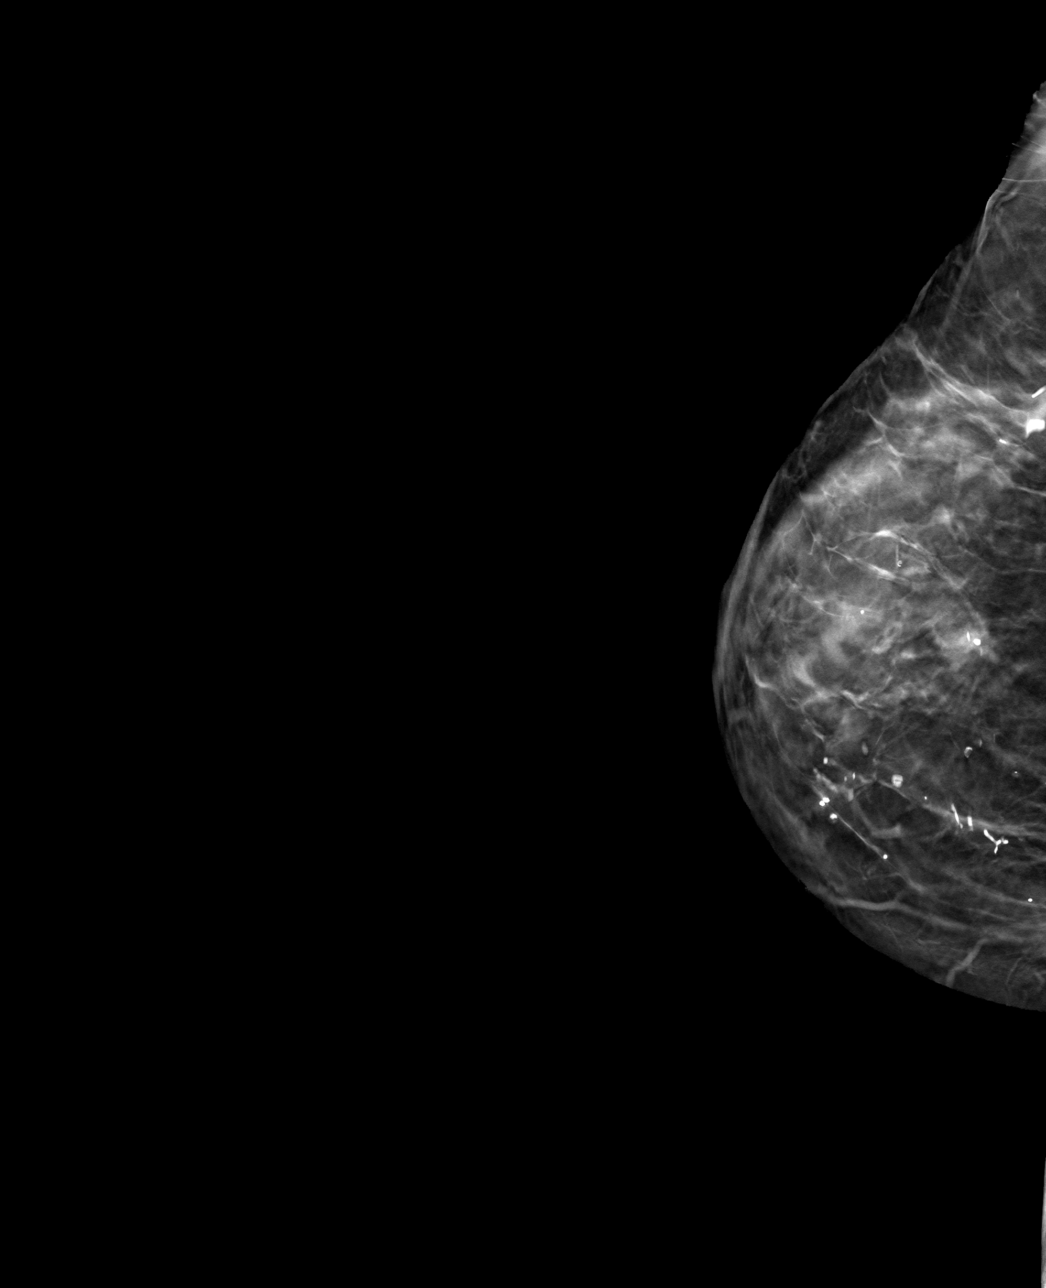

[4 of 12 positions shown; findings below may reference images not displayed]

FINDINGS: Mammographic images were obtained following ultrasound guided biopsy
of the 1.4 cm mass at the 12 o'clock position of the RIGHT breast.
The RIBBON biopsy marking clip is in expected position at the site
of biopsy.
IMPRESSION: Appropriate positioning of the RIBBON shaped biopsy marking clip at
the site of biopsy in the UPPER RIGHT breast.

Final Assessment: Post Procedure Mammograms for Marker Placement

## 2019-06-27 ENCOUNTER — Other Ambulatory Visit: Payer: Medicare Other

## 2019-06-28 ENCOUNTER — Other Ambulatory Visit: Payer: Self-pay | Admitting: Hematology and Oncology

## 2019-06-28 DIAGNOSIS — D696 Thrombocytopenia, unspecified: Secondary | ICD-10-CM

## 2019-06-28 NOTE — Progress Notes (Signed)
[place] [phone]   [phone]) [phone]  PROGRESS NOTE  Patient Care Team: Antony Contras, MD as PCP - General (Family Medicine)  Hematological/Oncological History #Leukopenia, Lymphopenia #Thrombocytopenia 1) 05/15/2010: WBC 4.5, Hgb 14.1, Plt 124, MCV 88.5. ANC 2.9, Lymph 1.1. Earliest CBC on record.  2) 01/25/2019: WBC 3.0, Hgb 12.8, Plt 101, MCV 93.3. ANC 1.8, Lymph 0.8, Mono 0.2 3) 03/15/2019: WBC 2.5, Hgb 12.9, Plt 96. MCV 93.6. ANC 1.5, Lymph 0.7, Mono 0.2 4) 03/31/2019: establish care with Dr. Lorenso Courier    #Recurrent Breast Cancer  1) underwent lumpectomy and radiation therapy in 2000 2) declined chemotherapy and only received 6 months of tamoxifen therapy 3) 06/16/2019: Mammogram showed a new 1.4 cm mass has been noted in the right breast. Biopsy performed showed 90% ER, 20% PR, and HER2 negative. 4) 07/01/2019: clinic visit scheduled with breast surgery   Interval History:  Catherine Decker 79 y.o. female with medical history significant for leukopenia/thrombocytopenia who presents for a follow up visit. The patient's last visit was on 03/31/2019 at which time she established care. In the interim since the last visit she has established care with [facility] for cirrhosis and has had abnormal findings on a recent mammogram.  Ms. Felicetti notes that overall she has been well since our last visit in January 2021.  She has successfully established with the liver clinic at [facility].  Unfortunately in the interim she did have a recent fall (in the last month) during which time she hit her head and developed a bump over her right eye.  She notes that the bleeding stopped in a timely fashion with compression held on the site.  She reports it lasted no more than 5 minutes.  She endorses having taken her folic acid and vitamin B12 as prescribed during her last visit.  She underwent EGD and colonoscopy on 06/13/2019 with no concerning abnormalities noted at that time.   Additionally she has been gaining some weight with an increase from 169 to 174 pounds.   Unfortunately the patient also notes that on her recent mammogram she was found to have a new spot on the chest wall.  She is being seen in breast clinic on Friday and notes that she is anxious for their recommendations moving forward. Otherwise she denies new symptoms or concerns. A 10 point ROS is listed below.   MEDICAL HISTORY:  Past Medical History:  Diagnosis Date  . Anemia   . Breast cancer ([place])   . Cancer [facility])    right breast   . Cirrhosis of liver ([place])   . Diabetes mellitus   . GERD (gastroesophageal reflux disease)    Also, Hiatal Hernia   . Hyperlipidemia   . Splenic artery aneurysm [facility])     SURGICAL HISTORY: Past Surgical History:  Procedure Laterality Date  . BREAST LUMPECTOMY     Right breast lumpectomy  . CHOLECYSTECTOMY  04/2010  . TUBAL LIGATION  1975    ALLERGIES:  is allergic to meperidine and demerol.  MEDICATIONS:  Current Outpatient Medications  Medication Sig Dispense Refill  . cholecalciferol (VITAMIN D3) 25 MCG (1000 UNIT) tablet Take 1,000 Units by mouth daily.    Marland Kitchen CINNAMON PO Take by mouth.    . co-enzyme Q-10 30 MG capsule Take 30 mg by mouth daily.    . fenofibrate 160 MG tablet Take 160 mg by mouth daily.    . fish oil-omega-3 fatty acids 1000 MG capsule Take 1 g by mouth daily.    Marland Kitchen  folic acid (FOLVITE) 1 MG tablet Take 1 tablet (1 mg total) by mouth daily. 90 tablet 3  . levothyroxine (EUTHYROX) 125 MCG tablet Take 125 mcg by mouth daily before breakfast.    . losartan (COZAAR) 25 MG tablet SMARTSIG:.5 Tablet(s) By Mouth Daily    . metFORMIN (GLUCOPHAGE) 500 MG tablet Take 500 mg by mouth daily with breakfast.    . omeprazole (PRILOSEC) 20 MG capsule Take 20 mg by mouth daily.    . pioglitazone (ACTOS) 15 MG tablet Take 15 mg by mouth daily.    . simvastatin (ZOCOR) 20 MG tablet Take 20 mg by mouth every evening.     No current  facility-administered medications for this visit.    REVIEW OF SYSTEMS:   Constitutional: ( - ) fevers, ( - )  chills , ( - ) night sweats Eyes: ( - ) blurriness of vision, ( - ) double vision, ( - ) watery eyes Ears, nose, mouth, throat, and face: ( - ) mucositis, ( - ) sore throat Respiratory: ( - ) cough, ( - ) dyspnea, ( - ) wheezes Cardiovascular: ( - ) palpitation, ( - ) chest discomfort, ( - ) lower extremity swelling Gastrointestinal:  ( - ) nausea, ( - ) heartburn, ( - ) change in bowel habits Skin: ( - ) abnormal skin rashes Lymphatics: ( - ) new lymphadenopathy, ( - ) easy bruising Neurological: ( - ) numbness, ( - ) tingling, ( - ) new weaknesses Behavioral/Psych: ( - ) mood change, ( - ) new changes  All other systems were reviewed with the patient and are negative.  PHYSICAL EXAMINATION: ECOG PERFORMANCE STATUS: 1 - Symptomatic but completely ambulatory  Vitals:   06/29/19 1043  BP: (!) 140/58  Pulse: 77  Resp: 20  Temp: 98.2 F (36.8 C)  SpO2: 100%   Filed Weights   06/29/19 1043  Weight: 174 lb 12.8 oz (79.3 kg)    GENERAL: well appearing elderly Caucasian female in NAD  SKIN: skin color, texture, turgor are normal, no rashes or significant lesions EYES: conjunctiva are pink and non-injected, sclera clear LUNGS: clear to auscultation and percussion with normal breathing effort HEART: regular rate & rhythm and no murmurs and no lower extremity edema ABDOMEN: soft, non-tender, non-distended, normal bowel sounds Musculoskeletal: no cyanosis of digits and no clubbing  PSYCH: alert & oriented x 3, fluent speech NEURO: no focal motor/sensory deficits  LABORATORY DATA:  I have reviewed the data as listed CBC Latest Ref Rng & Units 06/29/2019 03/31/2019 05/17/2010  WBC 4.0 - 10.5 K/uL 3.5(L) 3.5(L) 6.2  Hemoglobin 12.0 - 15.0 g/dL 12.5 13.1 12.8  Hematocrit 36.0 - 46.0 % 38.3 39.6 38.0  Platelets 150 - 400 K/uL 92(L) 96(L) 119(L)    CMP Latest Ref Rng & Units  06/29/2019 03/31/2019 06/01/2013  Glucose 70 - 99 mg/dL 126(H) 119(H) -  BUN 8 - 23 mg/dL 16 21 14   Creatinine 0.44 - 1.00 mg/dL 1.12(H) 1.22(H) 0.85  Sodium 135 - 145 mmol/L 144 142 -  Potassium 3.5 - 5.1 mmol/L 4.4 4.3 -  Chloride 98 - 111 mmol/L 110 108 -  CO2 22 - 32 mmol/L 22 24 -  Calcium 8.9 - 10.3 mg/dL 8.9 9.5 -  Total Protein 6.5 - 8.1 g/dL 6.9 7.3 -  Total Bilirubin 0.3 - 1.2 mg/dL 0.9 0.8 -  Alkaline Phos 38 - 126 U/L 52 49 -  AST 15 - 41 U/L 31 29 -  ALT 0 -  44 U/L 16 17 -     RADIOGRAPHIC STUDIES:  US BREAST LTD UNI RIGHT INC AXILLA  Result Date: 06/16/2019 CLINICAL DATA:  Mass felt by the patient in the 1 o'clock position of the right breast for the past 2 months. Status post right lumpectomy breast cancer in 2000. EXAM: DIGITAL DIAGNOSTIC BILATERAL MAMMOGRAM WITH CAD AND TOMO ULTRASOUND RIGHT BREAST COMPARISON:  Previous exam(s). ACR Breast Density Category c: The breast tissue is heterogeneously dense, which may obscure small masses. FINDINGS: Interval small spiculated mass with some coarse calcification in the 12 o'clock position of the right breast. This is in the region of the mass felt by the patient, marked with a metallic marker. Stable post lumpectomy changes the posterior aspect of the upper-outer right breast. No interval findings on the left suspicious for malignancy. Mammographic images were processed with CAD. On physical exam, patient has an approximately 1.5 cm palpable mass in the 12 o'clock position of the right breast, 3 cm from the nipple. There are no palpable right axillary lymph nodes. Targeted ultrasound is performed, showing a 1.4 x 0.9 x 0.5 cm irregular, hypoechoic mass in the 12 o'clock position of the right breast, 3 cm from the nipple. This has ill-defined surrounding increased echogenicity and corresponds to the palpable mass. A coarse calcification is demonstrated at the anterior aspect of the mass. Ultrasound of the right axilla demonstrated no  abnormal right axillary lymph nodes. IMPRESSION: 1. 1.4 cm mass in the 12 o'clock position of the right breast with imaging features highly suspicious for malignancy. 2. No evidence of malignancy elsewhere in either breast. RECOMMENDATION: Ultrasound-guided core needle biopsy of the 1.4 cm mass in the 12 o'clock position of the right breast. This has been discussed with the patient and scheduled at 12:45 p.m. on 06/27/2019. I have discussed the findings and recommendations with the patient. If applicable, a reminder letter will be sent to the patient regarding the next appointment. BI-RADS CATEGORY  5: Highly suggestive of malignancy. Electronically Signed   By: Claudie Revering M.D.   On: 06/16/2019 12:34   MM DIAG BREAST TOMO BILATERAL  Result Date: 06/16/2019 CLINICAL DATA:  Mass felt by the patient in the 1 o'clock position of the right breast for the past 2 months. Status post right lumpectomy breast cancer in 2000. EXAM: DIGITAL DIAGNOSTIC BILATERAL MAMMOGRAM WITH CAD AND TOMO ULTRASOUND RIGHT BREAST COMPARISON:  Previous exam(s). ACR Breast Density Category c: The breast tissue is heterogeneously dense, which may obscure small masses. FINDINGS: Interval small spiculated mass with some coarse calcification in the 12 o'clock position of the right breast. This is in the region of the mass felt by the patient, marked with a metallic marker. Stable post lumpectomy changes the posterior aspect of the upper-outer right breast. No interval findings on the left suspicious for malignancy. Mammographic images were processed with CAD. On physical exam, patient has an approximately 1.5 cm palpable mass in the 12 o'clock position of the right breast, 3 cm from the nipple. There are no palpable right axillary lymph nodes. Targeted ultrasound is performed, showing a 1.4 x 0.9 x 0.5 cm irregular, hypoechoic mass in the 12 o'clock position of the right breast, 3 cm from the nipple. This has ill-defined surrounding increased  echogenicity and corresponds to the palpable mass. A coarse calcification is demonstrated at the anterior aspect of the mass. Ultrasound of the right axilla demonstrated no abnormal right axillary lymph nodes. IMPRESSION: 1. 1.4 cm mass in the 12 o'clock position  of the right breast with imaging features highly suspicious for malignancy. 2. No evidence of malignancy elsewhere in either breast. RECOMMENDATION: Ultrasound-guided core needle biopsy of the 1.4 cm mass in the 12 o'clock position of the right breast. This has been discussed with the patient and scheduled at 12:45 p.m. on 06/27/2019. I have discussed the findings and recommendations with the patient. If applicable, a reminder letter will be sent to the patient regarding the next appointment. BI-RADS CATEGORY  5: Highly suggestive of malignancy. Electronically Signed   By: Claudie Revering M.D.   On: 06/16/2019 12:34   MM CLIP PLACEMENT RIGHT  Result Date: 06/24/2019 CLINICAL DATA:  Evaluate RIBBON clip placement following ultrasound-guided RIGHT breast biopsy. EXAM: DIAGNOSTIC RIGHT MAMMOGRAM POST ULTRASOUND BIOPSY COMPARISON:  Previous exam(s). FINDINGS: Mammographic images were obtained following ultrasound guided biopsy of the 1.4 cm mass at the 12 o'clock position of the RIGHT breast. The RIBBON biopsy marking clip is in expected position at the site of biopsy. IMPRESSION: Appropriate positioning of the RIBBON shaped biopsy marking clip at the site of biopsy in the UPPER RIGHT breast. Final Assessment: Post Procedure Mammograms for Marker Placement Electronically Signed   By: Margarette Canada M.D.   On: 06/24/2019 15:33   [place] RT BREAST BX W LOC DEV 1ST LESION IMG BX SPEC US GUIDE  Addendum Date: 06/27/2019   ADDENDUM REPORT: 06/27/2019 13:38 ADDENDUM: Pathology revealed GRADE II INVASIVE MAMMARY CARCINOMA of the RIGHT breast, 12 o'clock, 3cmfn. This was found to be concordant by Dr. Hassan Rowan. Pathology results were discussed with the patient by telephone.  The patient reported doing well after the biopsy with tenderness at the site. Post biopsy instructions and care were reviewed and questions were answered. The patient was encouraged to call [facility] for any additional concerns. Surgical consultation has been arranged with Dr. Erroll Luna at [facility] on July 01, 2019. Pathology results reported by Terie Purser, RN on 06/27/2019. Electronically Signed   By: Margarette Canada M.D.   On: 06/27/2019 13:38   Result Date: 06/27/2019 CLINICAL DATA:  79 year old female for tissue sampling of 1.4 cm UPPER RIGHT breast mass. EXAM: ULTRASOUND GUIDED RIGHT BREAST CORE NEEDLE BIOPSY COMPARISON:  Previous exam(s). FINDINGS: I met with the patient and we discussed the procedure of ultrasound-guided biopsy, including benefits and alternatives. We discussed the high likelihood of a successful procedure. We discussed the risks of the procedure, including infection, bleeding, tissue injury, clip migration, and inadequate sampling. Informed written consent was given. The usual time-out protocol was performed immediately prior to the procedure. Using sterile technique and 1% Lidocaine as local anesthetic, under direct ultrasound visualization, a 14 gauge spring-loaded device was used to perform biopsy of the 1.4 cm mass at the 12 o'clock position of the RIGHT breast 3 cm from the nipple using a LATERAL approach. At the conclusion of the procedure, a RIBBON tissue marker clip was deployed into the biopsy cavity. Follow up 2 view mammogram was performed and dictated separately. IMPRESSION: Ultrasound guided biopsy of 1.4 cm UPPER RIGHT breast mass. No apparent complications. Electronically Signed: By: Margarette Canada M.D. On: 06/24/2019 15:20    ASSESSMENT & PLAN Catherine Decker 79 y.o. female with medical history significant for leukopenia/thrombocytopenia who presents for a follow up visit.  After review the labs discussion with the patient  her findings are most consistent with leukopenia and thrombocytopenia due to chronic liver disease.  The patient has not responded to vitamin B12  or folate therapy, but we did wish to assure that her levels of these are replete.  The patient is fortunately already established with the lobe our liver clinic and therefore these cytopenias could further be followed in their clinic with re-referral in the event that they were to worsen.  Unfortunately for the patient in the interim she was found to have an abnormal finding on mammography.  On 06/16/2019 she was found to have a new 1.5 cm lesion within the right breast.  She has already undergone biopsy of this lesion which shows an ER positive, PR positive, HER-2 negative breast cancer.  She is to see the surgeons on Friday for discussion moving forward with treatment.  I noted to her that she could be seen at the breast center versus our clinic based on her level of comfort.  She notes that she wishes to speak with the surgeons first and then she can get back to [place].  In the event the patient wishes to receive her breast care from one of the breast oncologists there is no need for [place] to follow her cytopenias in our clinic any further.  #Bicytopenia: Leukopenia and Thrombocytopenia --today will order repeat CBC, CMP, and peripheral blood film --most likely etiology at this time is the patient's cirrhosis of the liver.  --patient has been on vitamin b12 and folate in the interim with no remarkable change in her blood counts. --continue to monitor  #Cirrhosis of the Liver --unclear etiology, thought be 2/2 to childhood illness with hepatitis --following with hepatology for evaluation and [place] screening --continue to monitor   #History of Breast Cancer #Recurrence of Breast Cancer: ER+/PR+/HER2- --underwent lumpectomy and radiation therapy in 2000 --declined chemotherapy and only received 6 months of tamoxifen therapy --prior mammogram performed in 2018,  new imaging performed earlier this month (06/2019)  --unfortunately a new 1.4 cm mass has been noted in the right breast. Biopsy performed showed 90% ER, 20% PR, and HER2 negative.  --will have [place] discussion with surgery and radiation oncology. Based on the CALOR Trial ( J Clin Oncol. [phone]) would recommend surgical resection followed by adjuvant aromatase ihibitor therapy.  --continue to work with the [facility] team for this patient.   No orders of the defined types were placed in this encounter.   All questions were answered. The patient knows to call the clinic with any problems, questions or concerns.  A total of more than 30 minutes were spent on this encounter and over half of that time was spent on counseling and coordination of care as outlined above.   Ledell Peoples, MD Department of Hematology/Oncology [place] at [facility] Phone: [phone] Pager: [phone] Email: [email or web address]  07/03/2019 5:09 PM   Literature Support:   Donley Redder Vernie Murders, et al. Efficacy of chemotherapy for ER-negative and ER-positive isolated locoregional recurrence of breast cancer: final analysis of the CALOR trial. J Clin Oncol. 0277;41:2878-6.  --The final analysis of CALOR confirms that chemtotherapy benefits patients with resected ER-negative ILRR and does not support the use of chemotherapy for ER-positive ILRR.

## 2019-06-29 ENCOUNTER — Inpatient Hospital Stay: Payer: Medicare Other

## 2019-06-29 ENCOUNTER — Other Ambulatory Visit: Payer: Self-pay

## 2019-06-29 ENCOUNTER — Inpatient Hospital Stay: Payer: Medicare Other | Attending: Hematology and Oncology | Admitting: Hematology and Oncology

## 2019-06-29 VITALS — BP 140/58 | HR 77 | Temp 98.2°F | Resp 20 | Ht 63.0 in | Wt 174.8 lb

## 2019-06-29 DIAGNOSIS — K7460 Unspecified cirrhosis of liver: Secondary | ICD-10-CM | POA: Diagnosis not present

## 2019-06-29 DIAGNOSIS — D696 Thrombocytopenia, unspecified: Secondary | ICD-10-CM

## 2019-06-29 DIAGNOSIS — D72810 Lymphocytopenia: Secondary | ICD-10-CM | POA: Diagnosis not present

## 2019-06-29 DIAGNOSIS — C50911 Malignant neoplasm of unspecified site of right female breast: Secondary | ICD-10-CM | POA: Diagnosis not present

## 2019-06-29 DIAGNOSIS — Z923 Personal history of irradiation: Secondary | ICD-10-CM | POA: Diagnosis not present

## 2019-06-29 DIAGNOSIS — Z79899 Other long term (current) drug therapy: Secondary | ICD-10-CM | POA: Diagnosis not present

## 2019-06-29 DIAGNOSIS — Z853 Personal history of malignant neoplasm of breast: Secondary | ICD-10-CM | POA: Insufficient documentation

## 2019-06-29 DIAGNOSIS — Z170 Estrogen receptor positive status [ER+]: Secondary | ICD-10-CM | POA: Insufficient documentation

## 2019-06-29 DIAGNOSIS — C50211 Malignant neoplasm of upper-inner quadrant of right female breast: Secondary | ICD-10-CM | POA: Insufficient documentation

## 2019-06-29 LAB — CBC WITH DIFFERENTIAL (CANCER CENTER ONLY)
Abs Immature Granulocytes: 0.01 10*3/uL (ref 0.00–0.07)
Basophils Absolute: 0 10*3/uL (ref 0.0–0.1)
Basophils Relative: 1 %
Eosinophils Absolute: 0.1 10*3/uL (ref 0.0–0.5)
Eosinophils Relative: 4 %
HCT: 38.3 % (ref 36.0–46.0)
Hemoglobin: 12.5 g/dL (ref 12.0–15.0)
Immature Granulocytes: 0 %
Lymphocytes Relative: 18 %
Lymphs Abs: 0.6 10*3/uL — ABNORMAL LOW (ref 0.7–4.0)
MCH: 31.6 pg (ref 26.0–34.0)
MCHC: 32.6 g/dL (ref 30.0–36.0)
MCV: 96.7 fL (ref 80.0–100.0)
Monocytes Absolute: 0.2 10*3/uL (ref 0.1–1.0)
Monocytes Relative: 7 %
Neutro Abs: 2.5 10*3/uL (ref 1.7–7.7)
Neutrophils Relative %: 70 %
Platelet Count: 92 10*3/uL — ABNORMAL LOW (ref 150–400)
RBC: 3.96 MIL/uL (ref 3.87–5.11)
RDW: 12.8 % (ref 11.5–15.5)
WBC Count: 3.5 10*3/uL — ABNORMAL LOW (ref 4.0–10.5)
nRBC: 0 % (ref 0.0–0.2)

## 2019-06-29 LAB — CMP (CANCER CENTER ONLY)
ALT: 16 U/L (ref 0–44)
AST: 31 U/L (ref 15–41)
Albumin: 3.5 g/dL (ref 3.5–5.0)
Alkaline Phosphatase: 52 U/L (ref 38–126)
Anion gap: 12 (ref 5–15)
BUN: 16 mg/dL (ref 8–23)
CO2: 22 mmol/L (ref 22–32)
Calcium: 8.9 mg/dL (ref 8.9–10.3)
Chloride: 110 mmol/L (ref 98–111)
Creatinine: 1.12 mg/dL — ABNORMAL HIGH (ref 0.44–1.00)
GFR, Est AFR Am: 55 mL/min — ABNORMAL LOW (ref >60)
GFR, Estimated: 47 mL/min — ABNORMAL LOW (ref >60)
Glucose, Bld: 126 mg/dL — ABNORMAL HIGH (ref 70–99)
Potassium: 4.4 mmol/L (ref 3.5–5.1)
Sodium: 144 mmol/L (ref 135–145)
Total Bilirubin: 0.9 mg/dL (ref 0.3–1.2)
Total Protein: 6.9 g/dL (ref 6.5–8.1)

## 2019-06-29 LAB — SAVE SMEAR(SSMR), FOR PROVIDER SLIDE REVIEW

## 2019-07-01 ENCOUNTER — Ambulatory Visit: Payer: Self-pay | Admitting: Surgery

## 2019-07-01 DIAGNOSIS — C50911 Malignant neoplasm of unspecified site of right female breast: Secondary | ICD-10-CM

## 2019-07-01 NOTE — H&P (Signed)
Nicoletta Dress Appointment: 07/01/2019 9:40 AM Location: [facility] Patient #: [id number] DOB: 03-Sep-1940 [name] / Race: White Female  History of Present Illness [name] A. Ingra Rother MD; 07/01/2019 1:25 PM) Patient words: Patient presents for evaluation of a right breast mass. She has a past history of right breast cancer treated in 2000 in [place]. She is treated with lumpectomy and radiation therapy. She took 6 months of tamoxifen and stopped. No records available for review. She had a mass in her right breast in the upper central port of the breast. Core biopsy showed invasive mammary carcinoma consistent with lobular estrogen receptor positive progesterone receptor positive HER-2/neu negative.        Mass felt by the patient in the 1 o'clock position of the right breast for the past 2 months. Status post right lumpectomy breast cancer in 2000.  EXAM: DIGITAL DIAGNOSTIC BILATERAL MAMMOGRAM WITH CAD AND TOMO  ULTRASOUND RIGHT BREAST  COMPARISON: Previous exam(s).  ACR Breast Density Category c: The breast tissue is heterogeneously dense, which may obscure small masses.  FINDINGS: Interval small spiculated mass with some coarse calcification in the 12 o'clock position of the right breast. This is in the region of the mass felt by the patient, marked with a metallic marker. Stable post lumpectomy changes the posterior aspect of the upper-outer right breast. No interval findings on the left suspicious for malignancy.  Mammographic images were processed with CAD.  On physical exam, patient has an approximately 1.5 cm palpable mass in the 12 o'clock position of the right breast, 3 cm from the nipple. There are no palpable right axillary lymph nodes.  Targeted ultrasound is performed, showing a 1.4 x 0.9 x 0.5 cm irregular, hypoechoic mass in the 12 o'clock position of the right breast, 3 cm from the nipple. This has ill-defined  surrounding increased echogenicity and corresponds to the palpable mass. A coarse calcification is demonstrated at the anterior aspect of the mass.  Ultrasound of the right axilla demonstrated no abnormal right axillary lymph nodes.  IMPRESSION: 1. 1.4 cm mass in the 12 o'clock position of the right breast with imaging features highly suspicious for malignancy. 2. No evidence of malignancy elsewhere in either breast.  RECOMMENDATION: Ultrasound-guided core needle biopsy of the 1.4 cm mass in the 12 o'clock position of the right breast. This has been discussed with the patient and scheduled at 12:45 p.m. on 06/27/2019.  I have discussed the findings and recommendations with the patient. If applicable, a reminder letter will be sent to the patient regarding the next appointment.  BI-RADS CATEGORY 5: Highly suggestive of malignancy.   Electronically Signed By: Claudie Revering M.D. On: 06/16/2019 12:34            REASON FOR ADDENDUM, AMENDMENT OR CORRECTION: [id number]: REASON FOR ADDENDUM: E-cadherin results. 06/28/19 11:44:00 AM (AC:ah) ADDITIONAL INFORMATION: FLUORESCENCE IN-SITU HYBRIDIZATION Results: GROUP 5: HER2 **NEGATIVE** Equivocal form of amplification of the HER2 gene was detected in the IHC 2+ tissue sample received from this individual. HER2 FISH was performed by a technologist and cell imaging and analysis on the [employer]. RATIO OF HER2/CEN17 SIGNALS 1.32 AVERAGE HER2 COPY NUMBER PER CELL 2.45 The ratio of HER2/CEN 17 is within the range < 2.0 of HER2/CEN 17 and a copy number of HER2 signals per cell is <4.0. Arch Pathol Lab Med 1:1,2018 Vicente Males MD Pathologist, Electronic Signature ( Signed 06/29/2019) PROGNOSTIC INDICATORS Results: IMMUNOHISTOCHEMICAL AND MORPHOMETRIC ANALYSIS PERFORMED MANUALLY The tumor cells are EQUIVOCAL for Her2 (2+).  Her2 by FISH will be performed and results reported separately. Estrogen Receptor: 90%, POSITIVE,  STRONG STAINING INTENSITY Progesterone Receptor: 20%, POSITIVE, MODERATE STAINING INTENSITY Proliferation Marker Ki67: 20% REFERENCE RANGE ESTROGEN RECEPTOR 1 of 3 Amended copy Addendum FINAL for Sesma, Meleni C ([id number]) ADDITIONAL INFORMATION:(continued) NEGATIVE 0% POSITIVE =>1% REFERENCE RANGE PROGESTERONE RECEPTOR NEGATIVE 0% POSITIVE =>1% All controls stained appropriately Vicente Males MD Pathologist, Electronic Signature ( Signed 06/28/2019) FINAL DIAGNOSIS Diagnosis Breast, right, needle core biopsy, upper, 12 o'clock, 3cmfn - INVASIVE MAMMARY CARCINOMA. Microscopic Comment The carcinoma appears grade 2. The greatest linear extent of tumor in any one core is 14 mm. E-cadherin will be reported separately. Ancillary studies will be reported separately. The results were reported to [facility] on 06/27/2019. Intradepartmental consultation (Dr. Lyndon Code). Addendum: Immunohistochemistry for E-cadherin demonstrates weak expression. Lobular origin is favored. Intradepartmental consultation (Dr. Melina Copa). Gillie Manners MD Pathologist, Electronic Signature (Case signed 06/27/2019) Corrected Report Signer Gillie Manners MD Pathologist, Electronic Signature (Case signed 06/28/2019) Specimen Gross and Clinical Information Specimen Comment TIF: 3:10 PM; extracted < 22 minute; 79 year old female with 1.4cm mass.  The patient is a 79 year old female.   Allergies (Tanisha A. Owens Shark, [place]; 07/01/2019 9:47 AM) No Known Drug Allergies [07/01/2019]: Allergies Reconciled  Medication History (Tanisha A. Owens Shark, [place]; 2/42/3536 1:44 AM) Folic Acid (1MG Tablet, Oral) Active. Losartan Potassium (25MG Tablet, Oral) Active. Levothyroxine Sodium (137MCG Tablet, Oral) Active. Simvastatin (20MG Tablet, Oral) Active. Fish Oil (Oral) Specific strength unknown - Active. Omeprazole (40MG Capsule DR, Oral) Active. metFORMIN HCl (500MG Tablet, Oral)  Active. Medications Reconciled    Vitals (Tanisha A. Brown RMA; 07/01/2019 9:48 AM) 07/01/2019 9:48 AM Weight: 175.6 lb Height: 63in Body Surface Area: 1.83 m Body Mass Index: 31.11 kg/m  Temp.: 97.14F  Pulse: 80 (Regular)  BP: 132/78(Sitting, Left Arm, Standard)        Physical Exam (Desmond Szabo A. Braxtin Bamba MD; 07/01/2019 1:25 PM)  General Mental Status-Alert. General Appearance-Consistent with stated age. Hydration-Well hydrated. Voice-Normal.  Head and Neck Head-normocephalic, atraumatic with no lesions or palpable masses. Trachea-midline. Thyroid Gland Characteristics - normal size and consistency.  Breast Note: Bruising right central breast. Signs of lumpectomy with radiation into the right. Left breast normal.  Lymphatic Head & Neck  General Head & Neck Lymphatics: Bilateral - Description - Normal. Axillary  General Axillary Region: Bilateral - Description - Normal. Tenderness - Non Tender.    Assessment & Plan (Yamilka Lopiccolo A. Esabella Stockinger MD; 07/01/2019 1:26 PM)  BREAST CANCER, RIGHT (C50.911) Impression: Discussed options of lumpectomy versus mastectomy. She is a candidate for repeat lumpectomy given her advanced age and tumor characteristics. She would have to take antiestrogen therapy. For further medical oncology for opinion. Discussed mastectomy and lumpectomy with sentinel lymph node mapping and limitations of such given her previous treatments. Risk of lumpectomy include bleeding, infection, seroma, more surgery, use of seed/wire, wound care, cosmetic deformity and the need for other treatments, death , blood clots, death. Pt agrees to proceed. Risk of sentinel lymph node mapping include bleeding, infection, lymphedema, shoulder pain. stiffness, dye allergy. cosmetic deformity , blood clots, death, need for more surgery. Pt agres to proceed.  Current Plans You are being scheduled for surgery- Our schedulers will call you.  You should hear  from our office's scheduling department within 5 working days about the location, date, and time of surgery. We try to make accommodations for patient's preferences in scheduling surgery, but sometimes the OR schedule or the surgeon's schedule prevents [place]  from making those accommodations.  If you have not heard from our office [phone]) in 5 working days, call the office and ask for your surgeon's nurse.  If you have other questions about your diagnosis, plan, or surgery, call the office and ask for your surgeon's nurse.  Pt Education - [facility] Breast Cancer Information Given - [facility] "Breast Journey" Package We discussed the staging and pathophysiology of breast cancer. We discussed all of the different options for treatment for breast cancer including surgery, chemotherapy, radiation therapy, Herceptin, and antiestrogen therapy. We discussed a sentinel lymph node biopsy as she does not appear to having lymph node involvement right now. We discussed the performance of that with injection of radioactive tracer and blue dye. We discussed that she would have an incision underneath her axillary hairline. We discussed that there is a bout a 10-20% chance of having a positive node with a sentinel lymph node biopsy and we will await the permanent pathology to make any other first further decisions in terms of her treatment. One of these options might be to return to the operating room to perform an axillary lymph node dissection. We discussed about a 1-2% risk lifetime of chronic shoulder pain as well as lymphedema associated with a sentinel lymph node biopsy. We discussed the options for treatment of the breast cancer which included lumpectomy versus a mastectomy. We discussed the performance of the lumpectomy with a wire placement. We discussed a 10-20% chance of a positive margin requiring reexcision in the operating room. We also discussed that she may need radiation therapy or antiestrogen therapy or both  if she undergoes lumpectomy. We discussed the mastectomy and the postoperative care for that as well. We discussed that there is no difference in her survival whether she undergoes lumpectomy with radiation therapy or antiestrogen therapy versus a mastectomy. There is a slight difference in the local recurrence rate being 3-5% with lumpectomy and about 1% with a mastectomy. We discussed the risks of operation including bleeding, infection, possible reoperation. She understands her further therapy will be based on what her stages at the time of her operation.  Pt Education - [facility] breast cancer surgery: discussed with patient and provided information. Pt Education - [facility] Breast Biopsy HCI: discussed with patient and provided information. Pt Education - ABC (After Breast Cancer) Class Info: discussed with patient and provided information.

## 2019-07-03 ENCOUNTER — Encounter: Payer: Self-pay | Admitting: Hematology and Oncology

## 2019-07-05 ENCOUNTER — Other Ambulatory Visit: Payer: Self-pay | Admitting: Family Medicine

## 2019-07-05 DIAGNOSIS — M8580 Other specified disorders of bone density and structure, unspecified site: Secondary | ICD-10-CM

## 2019-07-06 ENCOUNTER — Encounter: Payer: Self-pay | Admitting: Adult Health

## 2019-07-06 DIAGNOSIS — C50411 Malignant neoplasm of upper-outer quadrant of right female breast: Secondary | ICD-10-CM | POA: Insufficient documentation

## 2019-07-08 ENCOUNTER — Other Ambulatory Visit: Payer: Self-pay | Admitting: Surgery

## 2019-07-08 DIAGNOSIS — C50911 Malignant neoplasm of unspecified site of right female breast: Secondary | ICD-10-CM

## 2019-07-22 ENCOUNTER — Other Ambulatory Visit: Payer: Self-pay

## 2019-07-22 ENCOUNTER — Ambulatory Visit
Admission: RE | Admit: 2019-07-22 | Discharge: 2019-07-22 | Disposition: A | Discharge status: Home / Self Care | Payer: Medicare Other | Source: Ambulatory Visit | Attending: Surgery | Admitting: Surgery

## 2019-07-22 DIAGNOSIS — C50911 Malignant neoplasm of unspecified site of right female breast: Secondary | ICD-10-CM

## 2019-07-22 IMAGING — MR MR BREAST BILAT WO/W CM
8 of 12 series · 32 of 48 positions shown · IV contrast (gadavist)
Comparison: Previous mammography

CLINICAL DATA: History of right lumpectomy in [90] for breast
cancer. Newly diagnosed breast cancer on the right at 12 o'clock.

LABS:  None
EXAM:
BILATERAL BREAST MRI WITH AND WITHOUT CONTRAST
TECHNIQUE: Multiplanar, multisequence MR images of both breasts were obtained
prior to and following the intravenous administration of 8 ml of
Gadavist

[Series 2: t2_tirm_tra ipat (a-p) · axial · 3.0mm · 0.70mm/px · 1 of 55 slices shown]
[im 1/55]
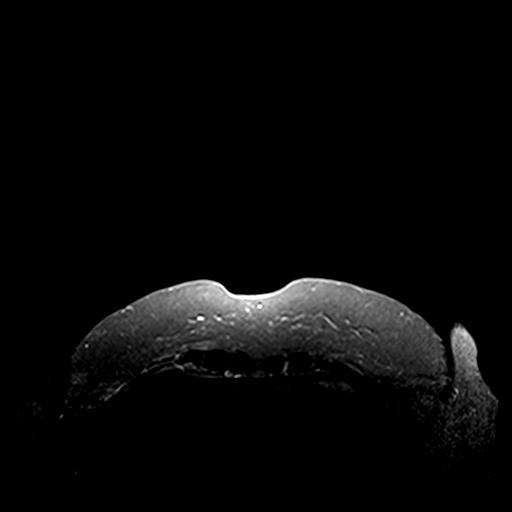

[Series 3: fl3d pre-cm no · axial · non-contrast · 1.2mm · 0.94mm/px · z∈[-62,+110]mm · 5 of 144 slices shown]
[im 1/144]
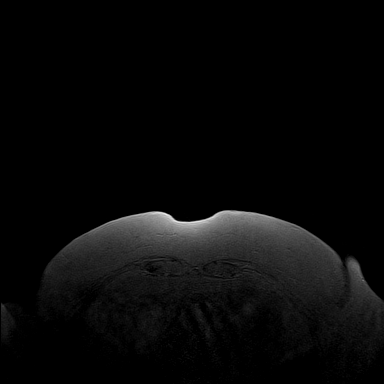
[im 36/144]
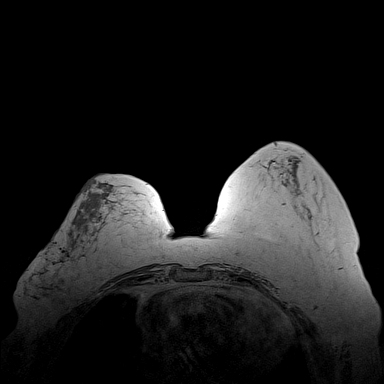
[im 72/144]
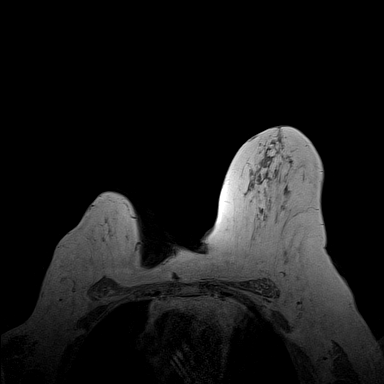
[im 108/144]
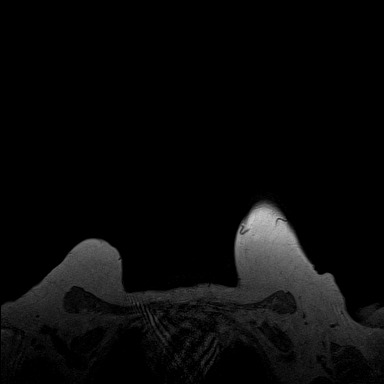
[im 144/144]
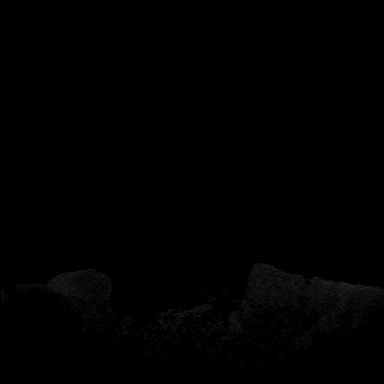

[Series 5: fl3d pre-cm · axial · non-contrast · 1.2mm · 0.94mm/px · z∈[-62,+110]mm · 5 of 144 slices shown]
[im 1/144]
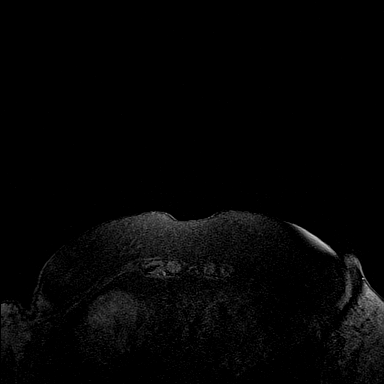
[im 36/144]
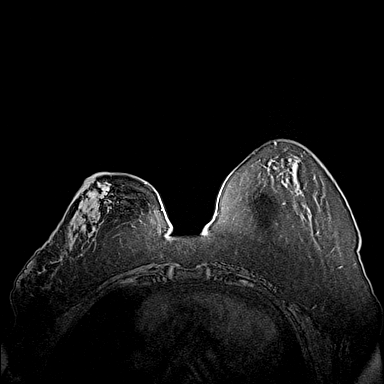
[im 72/144]
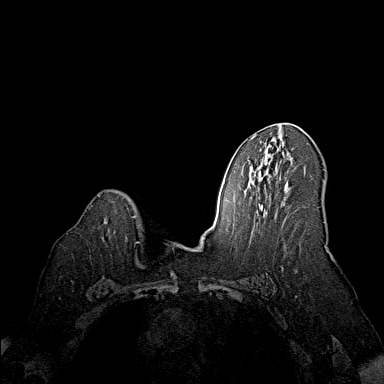
[im 108/144]
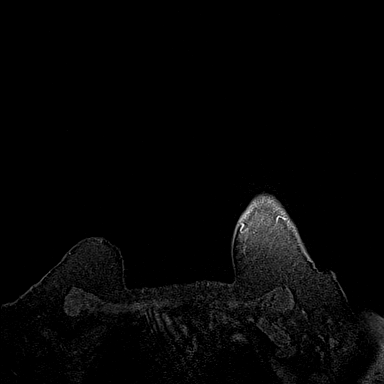
[im 144/144]
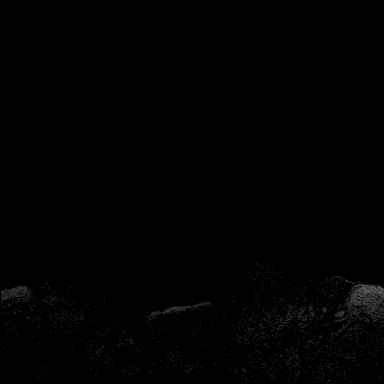

[Series 6: fl3d post-cm 20 · axial · 1.2mm · 0.94mm/px · z∈[-62,+110]mm · 5 of 144 slices shown (1 of 3)]
[im 1/144]
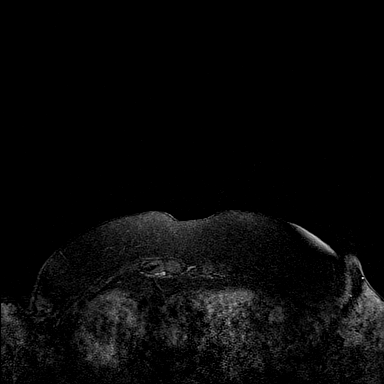
[im 36/144]
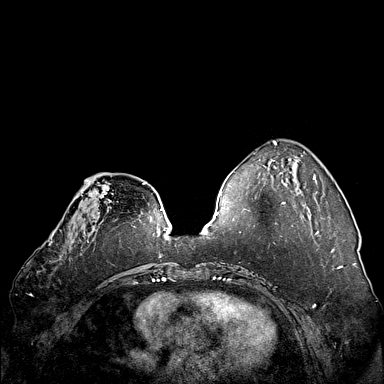
[im 72/144]
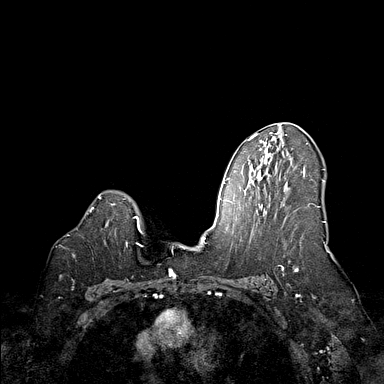
[im 108/144]
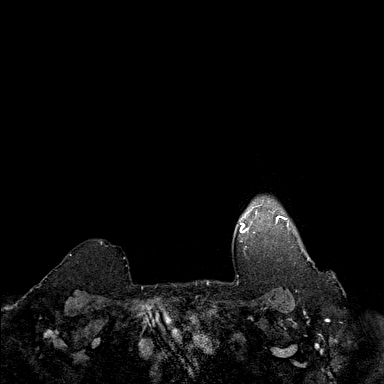
[im 144/144]
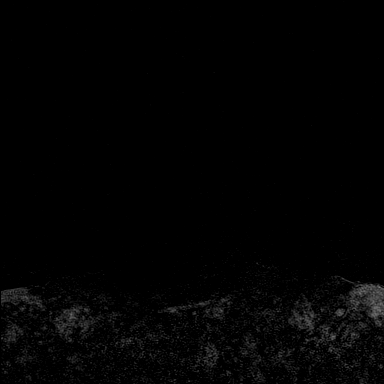

[Series 7: fl3d post-cm 20 · axial · 1.2mm · 0.94mm/px · z∈[-62,+110]mm · 5 of 144 slices shown (2 of 3)]
[im 1/144]
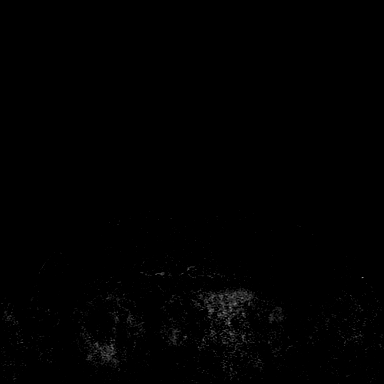
[im 36/144]
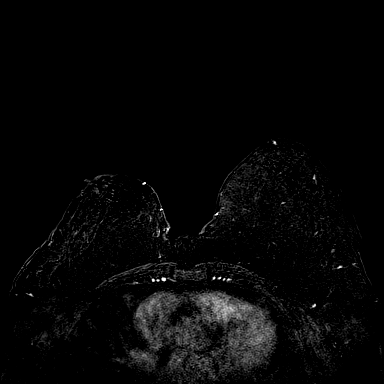
[im 72/144]
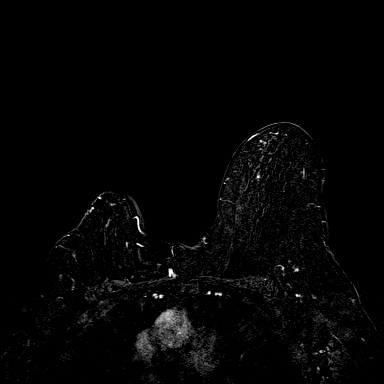
[im 108/144]
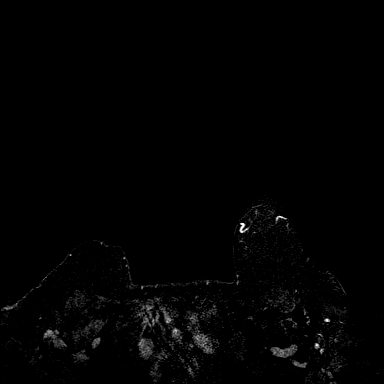
[im 144/144]
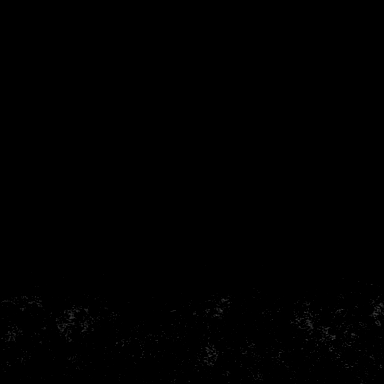

[Series 8: fl3d post-cm 20 · axial · 172.8mm · 0.94mm/px · 1 of 1 slices shown (3 of 3)]
[im 1/1]
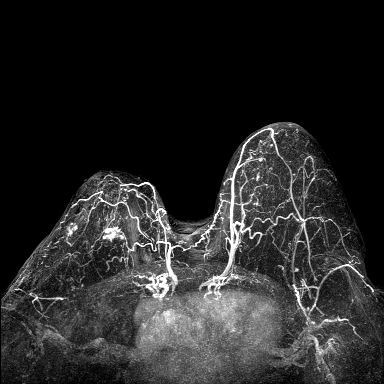

[Series 9: fl3d post-cm 3min · axial · 1.2mm · 0.94mm/px · z∈[-62,+110]mm · 6 of 144 slices shown]
[im 1/144]
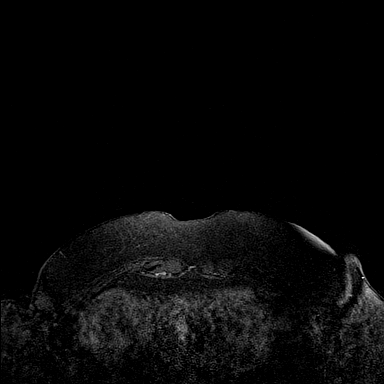
[im 29/144]
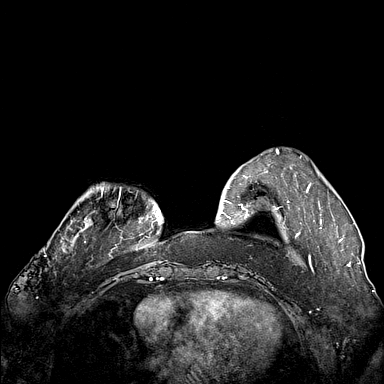
[im 58/144]
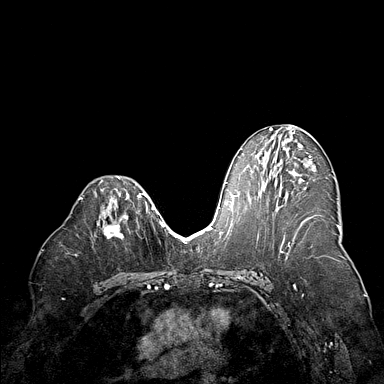
[im 86/144]
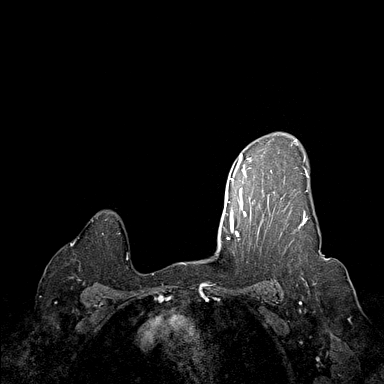
[im 115/144]
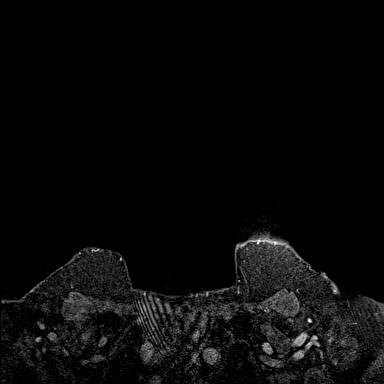
[im 144/144]
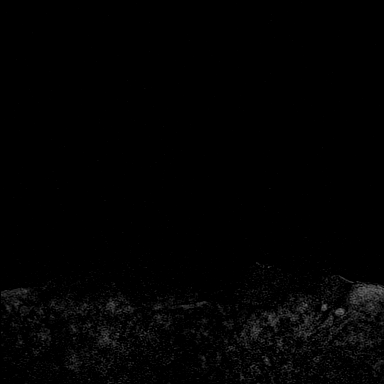

[Series 10: fl3d post-cm 3min_sub · axial · 1.2mm · 0.94mm/px · z∈[-62,+40]mm · 4 of 144 slices shown]
[im 1/144]
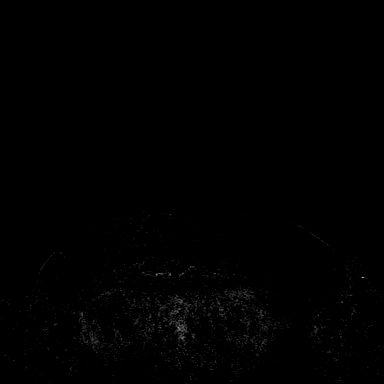
[im 29/144]
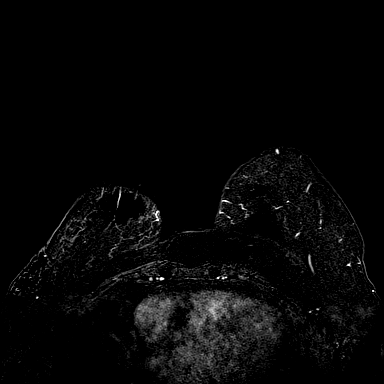
[im 58/144]
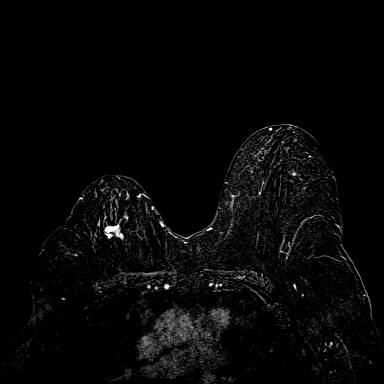
[im 86/144]
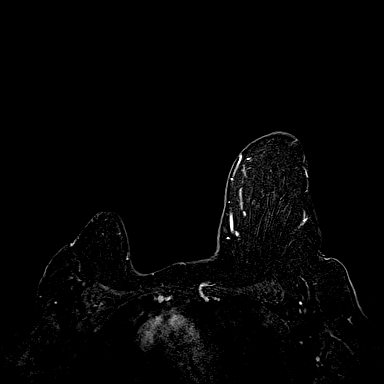

[32 of 48 positions shown; findings below may reference images not displayed]

Three-dimensional MR images were rendered by post-processing of the
original MR data on an independent workstation. The
three-dimensional MR images were interpreted, and findings are
reported in the following complete MRI report for this study. Three
dimensional images were evaluated at the independent [employer]
workstation
FINDINGS: Breast composition: c. Heterogeneous fibroglandular tissue.

Background parenchymal enhancement: Mild

Right breast: The known malignancy at 12 o'clock in the right breast
measures 2.0 by 1.4 by 2.1 cm in transverse, AP, and craniocaudal
dimensions. Suspicious non mass like enhancement is identified in
the upper outer quadrant of the right breast anterior to the
previous lumpectomy site measuring up to 1.4 cm, well seen on series
7, image 100. No other suspicious findings seen on the right.

Left breast: Several foci of enhancement arranged in a linear
pattern are seen in the superomedial left breast on series 7, image
70 spanning 4 cm in AP dimension. No other suspicious findings in
the left breast.

Lymph nodes: No abnormal appearing lymph nodes.

Ancillary findings:  None.
IMPRESSION: 1. 2.0 x 1.4 x 2.1 cm known malignancy at 12 o'clock in the right
breast.
2. Suspicious non mass enhancement in the upper outer quadrant of
the right breast suspicious for additional malignancy, anterior to
the previous right lumpectomy site.
3. Several foci of enhancement arranged in a linear pattern in the
superomedial left breast spanning 4 cm, nonspecific.
4. No adenopathy identified.

RECOMMENDATION:
If breast conservation is being considered, recommend MRI guided
biopsy of the suspicious enhancement in the upper outer right breast
just anterior to the right lumpectomy site. Recommend MRI guided
biopsy of the anterior and posterior extent of the linearly arranged
enhancement in the left breast described above.

BI-RADS CATEGORY  4: Suspicious.

These results will be called to the ordering clinician or
representative by the Radiologist Assistant, and communication
documented in the PACS or [REDACTED].

## 2019-07-22 MED ORDER — GADOBUTROL 1 MMOL/ML IV SOLN
8.0000 mL | Freq: Once | INTRAVENOUS | Status: AC | PRN
  Administered 2019-07-22: 8 mL via INTRAVENOUS

## 2019-07-26 ENCOUNTER — Other Ambulatory Visit: Payer: Self-pay | Admitting: Surgery

## 2019-07-26 DIAGNOSIS — R9389 Abnormal findings on diagnostic imaging of other specified body structures: Secondary | ICD-10-CM

## 2019-08-09 DIAGNOSIS — C801 Malignant (primary) neoplasm, unspecified: Secondary | ICD-10-CM

## 2019-08-09 HISTORY — DX: Malignant (primary) neoplasm, unspecified: C80.1

## 2019-08-11 ENCOUNTER — Other Ambulatory Visit: Payer: Self-pay | Admitting: Diagnostic Radiology

## 2019-08-11 ENCOUNTER — Ambulatory Visit
Admission: RE | Admit: 2019-08-11 | Discharge: 2019-08-11 | Disposition: A | Discharge status: Home / Self Care | Payer: Medicare Other | Source: Ambulatory Visit | Attending: Surgery | Admitting: Surgery

## 2019-08-11 ENCOUNTER — Other Ambulatory Visit: Payer: Self-pay

## 2019-08-11 DIAGNOSIS — R9389 Abnormal findings on diagnostic imaging of other specified body structures: Secondary | ICD-10-CM

## 2019-08-11 IMAGING — MG MM BREAST LOCALIZATION CLIP
4 series · 5 of 12 positions shown · non-contrast
Comparison: Previous exams.
COMPARISON: Previous exams.

Addendum:
CLINICAL DATA: Post MRI guided biopsy of linear non mass
enhancement in the inner posterior left breast, MRI guided biopsy of
linear non mass enhancement in the inner anterior left breast, and
MRI guided biopsy of non mass enhancement in the upper outer right
breast near the lumpectomy site.

EXAM:
DIAGNOSTIC BILATERAL MAMMOGRAM POST MRI BIOPSY

[L CC synth-2D]
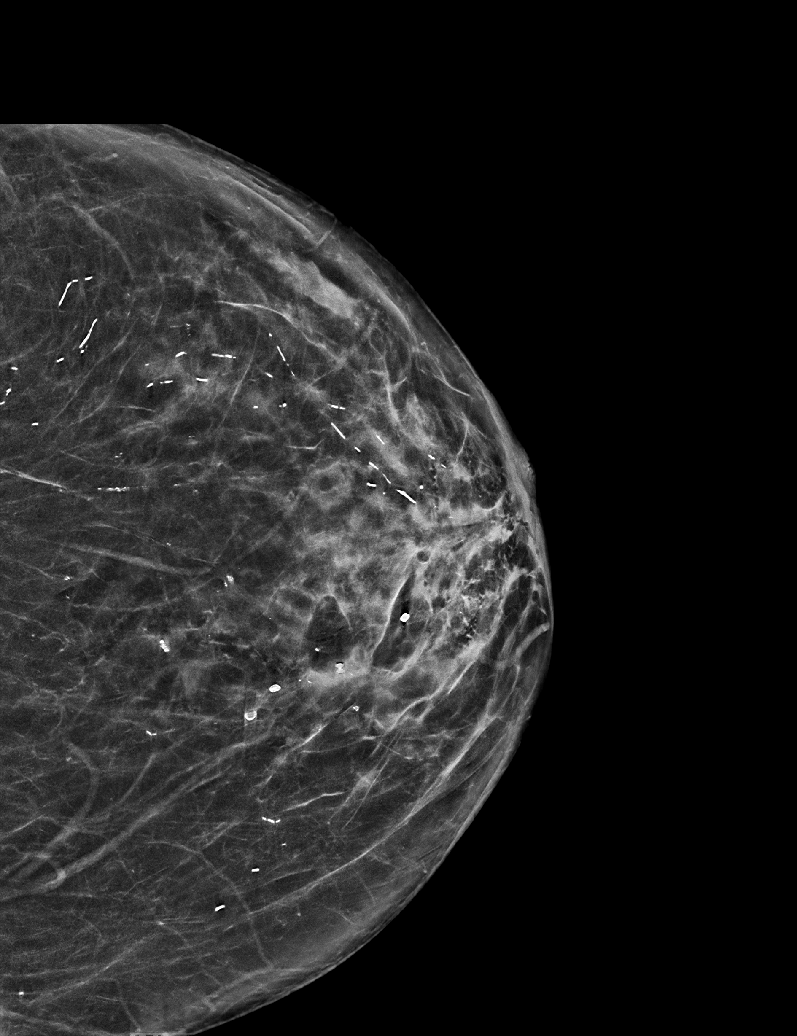

[L ML synth-2D]
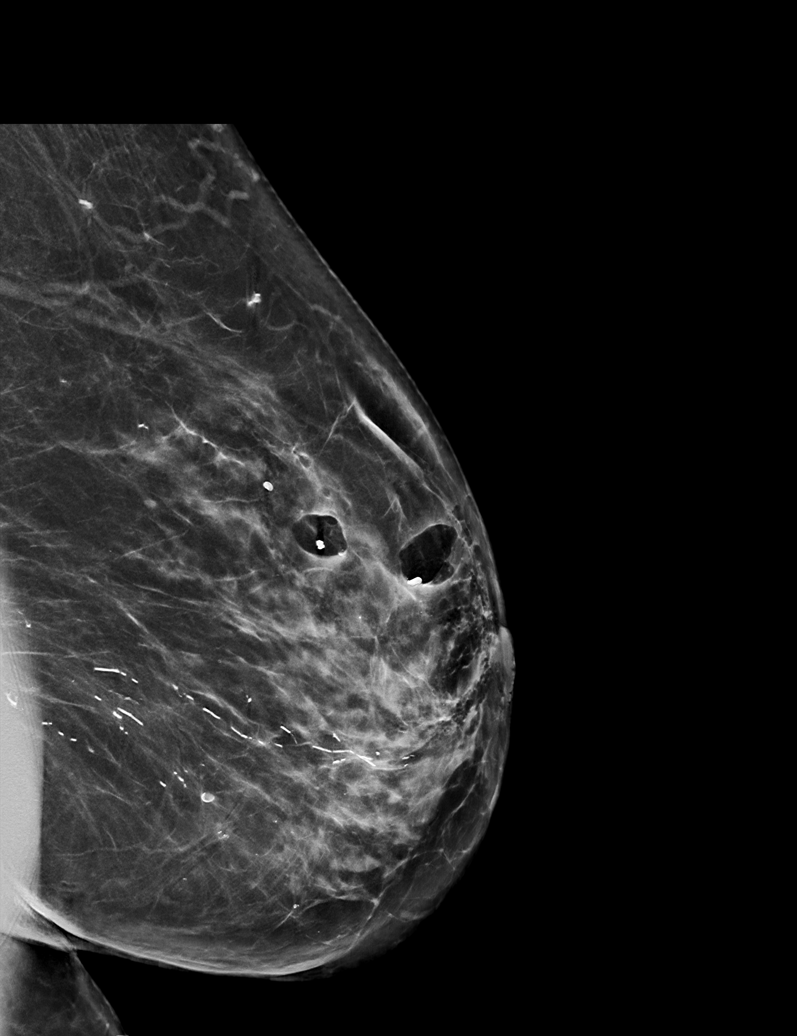

[L ML tomo · 2 of 75 frames shown]
[frame 25/75]
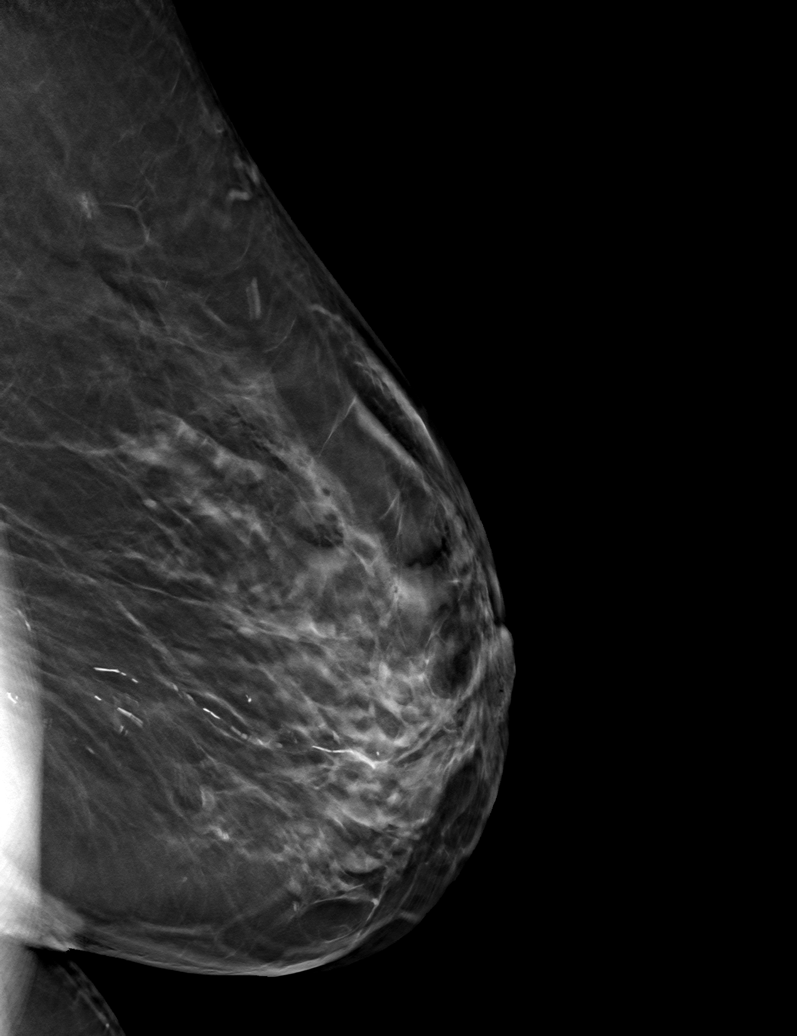
[frame 38/75]
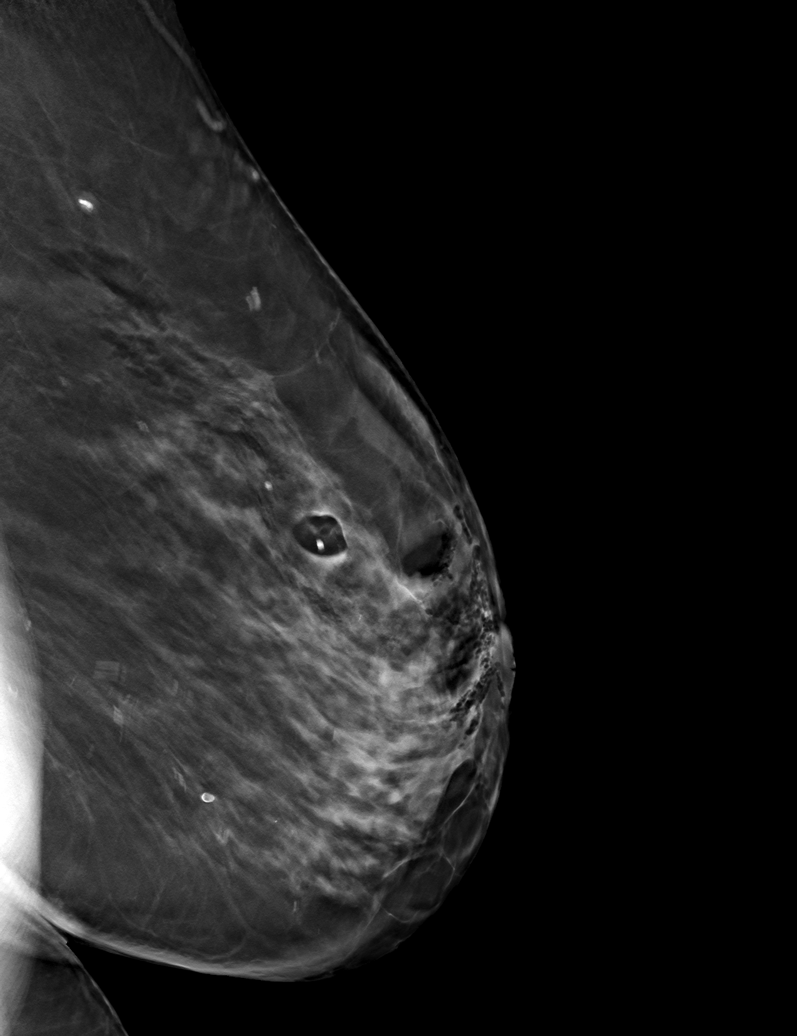

[L CC tomo · tomo slice 31/60.0]
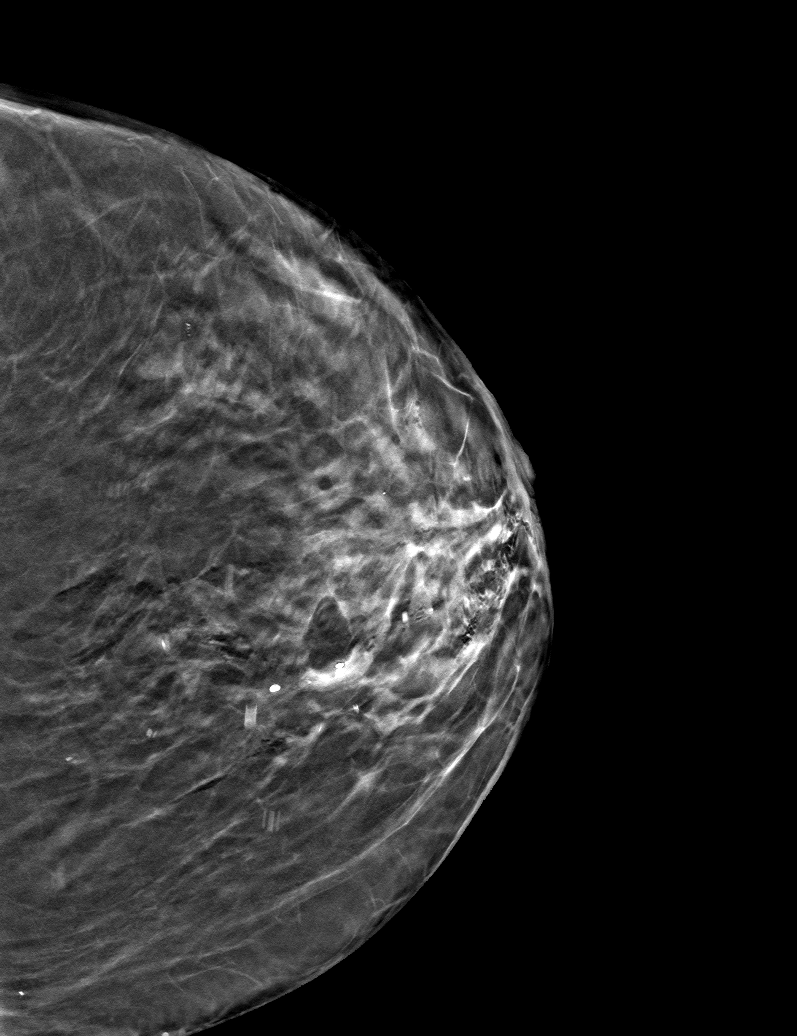

[5 of 12 positions shown; findings below may reference images not displayed]

FINDINGS: Mammographic images were obtained following MRI guided biopsy of
linear non mass enhancement in the inner posterior left breast, MRI
guided biopsy of linear non mass enhancement in the inner anterior
left breast, and MRI guided biopsy of non mass enhancement in the
upper outer right breast near the lumpectomy site.

1. A barbell shaped biopsy marking clip is present at the site of
the biopsied linear non mass enhancement in the inner posterior left
breast.

2. A cylindrical shaped biopsy marking clip is present at the site
of the biopsied linear non mass enhancement in the inner anterior
left breast.

3. A barbell shaped biopsy marking clip is present at the site of
the biopsied non mass enhancement in the upper outer right breast
near the lumpectomy site.
IMPRESSION: 1. Barbell shaped biopsy marking clip at site of biopsied linear non
mass enhancement in the inner posterior left breast.

2. Cylindrical shaped biopsy marking clip at site of biopsied linear
non mass enhancement in the inner anterior left breast.

3. Barbell shaped biopsy marking clip at site of biopsied non mass
enhancement in the upper outer right breast near the lumpectomy
site.

Final Assessment: Post Procedure Mammograms for Marker Placement

ADDENDUM:
Pathology revealed BENIGN BREAST TISSUE WITH FIBROCYSTIC CHANGES AND
STROMAL FIBROSIS, MICROCALCIFICATIONS of the LEFT breast, inner
posterior. This was found to be concordant by Dr. VINCENZA.

Pathology revealed BENIGN BREAST TISSUE WITH STROMAL FIBROSIS of the
LEFT breast, inner anterior. This was found to be concordant by Dr.
VINCENZA.

Pathology revealed GRADE II INVASIVE MAMMARY CARCINOMA, MAMMARY
CARCINOMA IN-SITU of the RIGHT breast, upper outer quadrant.
E-cadherin is NEGATIVE supporting lobular origin. This was found to
be concordant by Dr. VINCENZA.

Multiple attempts to reach the patient were unsuccessful, [DATE] x 5 times, [DATE] x 3 times, and lastly on [DATE].

VINCENZA, CMA with Dr. VINCENZA at [REDACTED], contacted the patient by telephone and discussed biopsy
results.

The patient has a recent diagnosis of RIGHT breast cancer and should
follow her outlined treatment plan with Dr. VINCENZA.

Pathology results reported by VINCENZA, RN on [DATE].

*** End of Addendum ***
FINDINGS: Mammographic images were obtained following MRI guided biopsy of
linear non mass enhancement in the inner posterior left breast, MRI
guided biopsy of linear non mass enhancement in the inner anterior
left breast, and MRI guided biopsy of non mass enhancement in the
upper outer right breast near the lumpectomy site.

1. A barbell shaped biopsy marking clip is present at the site of
the biopsied linear non mass enhancement in the inner posterior left
breast.

2. A cylindrical shaped biopsy marking clip is present at the site
of the biopsied linear non mass enhancement in the inner anterior
left breast.

3. A barbell shaped biopsy marking clip is present at the site of
the biopsied non mass enhancement in the upper outer right breast
near the lumpectomy site.
IMPRESSION: 1. Barbell shaped biopsy marking clip at site of biopsied linear non
mass enhancement in the inner posterior left breast.

2. Cylindrical shaped biopsy marking clip at site of biopsied linear
non mass enhancement in the inner anterior left breast.

3. Barbell shaped biopsy marking clip at site of biopsied non mass
enhancement in the upper outer right breast near the lumpectomy
site.

Final Assessment: Post Procedure Mammograms for Marker Placement

## 2019-08-11 IMAGING — MR MR BREAST BX W/ LOC DEV 1ST LEASION IMAGE BX SPEC MR GUIDE*R*
7 of 10 series · 29 of 48 positions shown · IV contrast (7 ml Gadavist)
Comparison: Previous exams.
COMPARISON: Previous exams.

Addendum:
CLINICAL DATA: 78-year-old female with recently diagnosed right
breast cancer at the 12 o'clock position presents for MRI guided
biopsy of suspicious non mass enhancement in the upper-outer
quadrant of the right breast in the region of the prior lumpectomy
site as well as MRI guided biopsy of 2 sites of linear oriented
enhancing foci in the inner left breast.

EXAM:
MRI GUIDED CORE NEEDLE BIOPSY OF THE BILATERAL BREAST
TECHNIQUE: Multiplanar, multisequence MR imaging of the bilateral breasts were
performed both before and after administration of intravenous
contrast.
CONTRAST:  7mL GADAVIST GADOBUTROL 1 MMOL/ML IV SOLN

[Series 3: fiducial bilateral · sagittal · 2.0mm · 1.33mm/px · 4 of 144 slices shown]
[im 1/144]
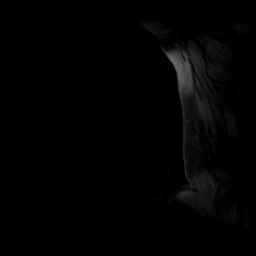
[im 48/144]
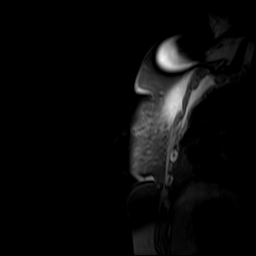
[im 96/144]
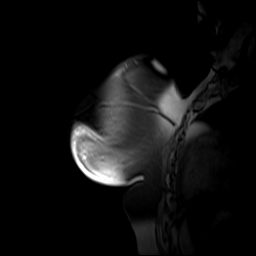
[im 144/144]
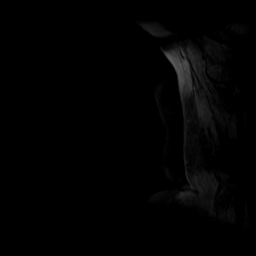

[Series 4: dynamic pre · axial · non-contrast · 1.3mm · 0.73mm/px · z∈[-48,+138]mm · 4 of 144 slices shown]
[im 1/144]
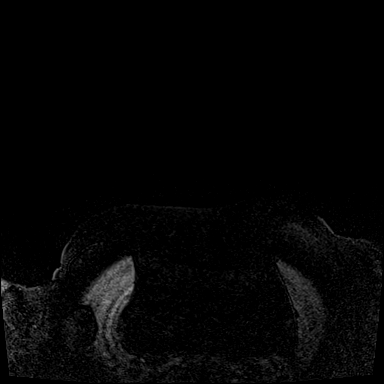
[im 48/144]
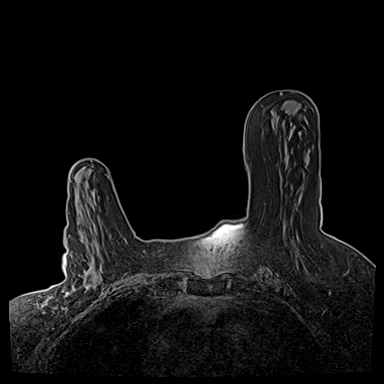
[im 96/144]
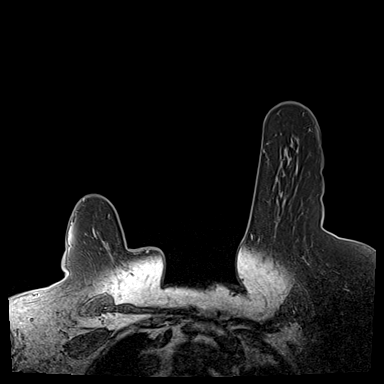
[im 144/144]
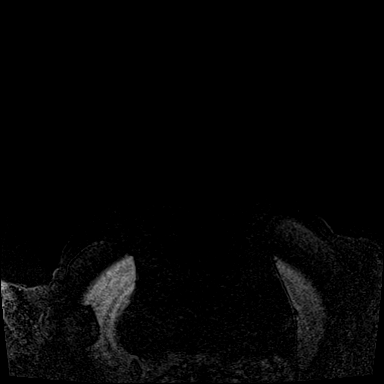

[Series 5: dynamic post 20 · axial · 1.3mm · 0.73mm/px · z∈[-48,+138]mm · 5 of 144 slices shown (1 of 2)]
[im 1/144]
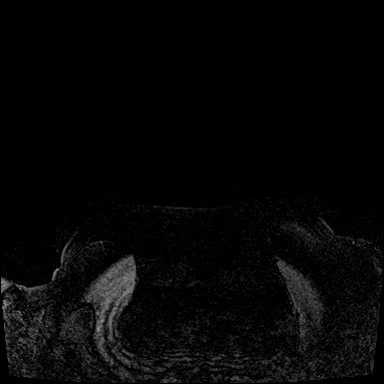
[im 36/144]
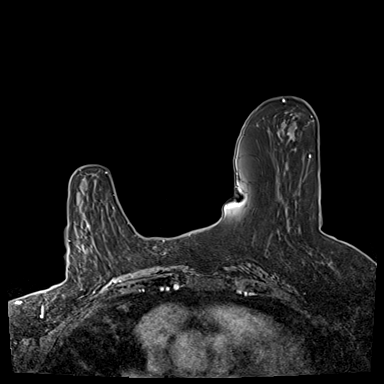
[im 72/144]
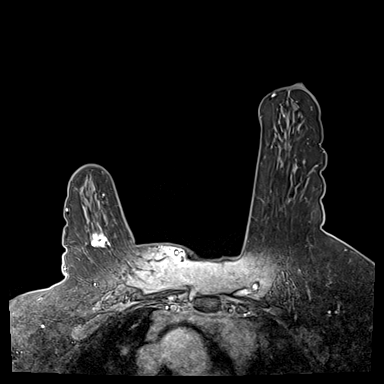
[im 108/144]
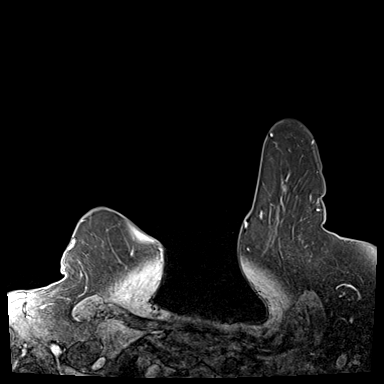
[im 144/144]
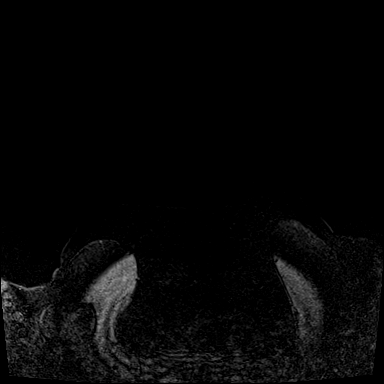

[Series 6: dynamic post 20 · axial · 1.3mm · 0.73mm/px · z∈[-48,+138]mm · 5 of 144 slices shown (2 of 2)]
[im 1/144]
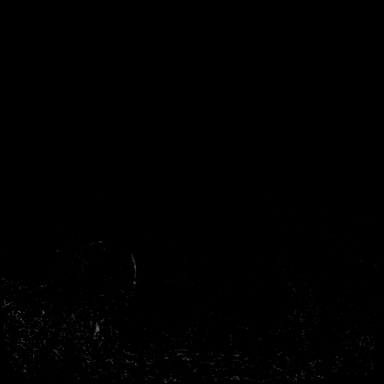
[im 36/144]
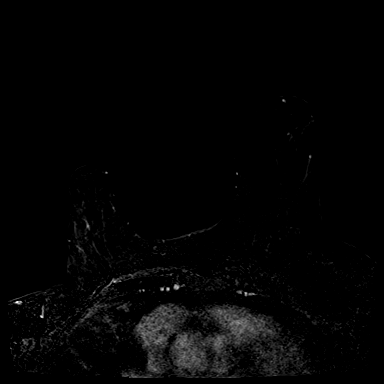
[im 72/144]
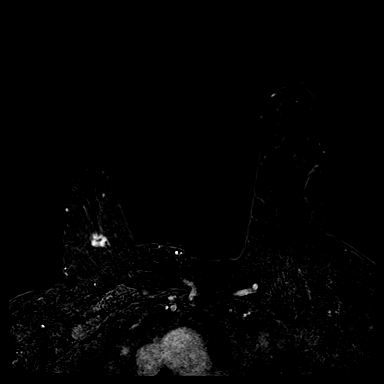
[im 108/144]
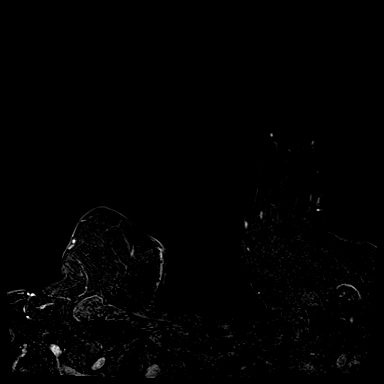
[im 144/144]
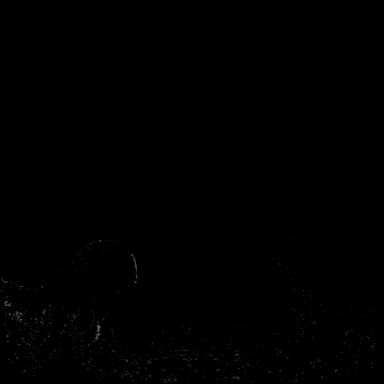

[Series 7: dynamic post 3 · axial · 1.3mm · 0.73mm/px · z∈[-48,+138]mm · 5 of 144 slices shown (1 of 2)]
[im 1/144]
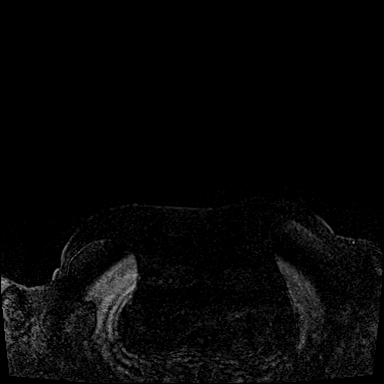
[im 36/144]
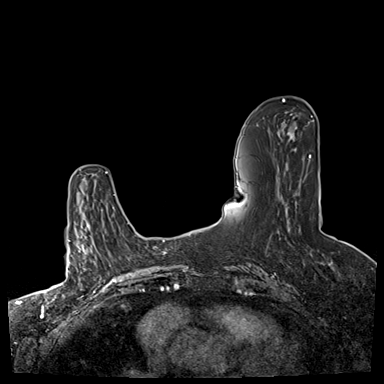
[im 72/144]
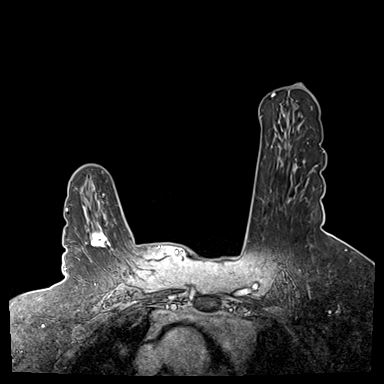
[im 108/144]
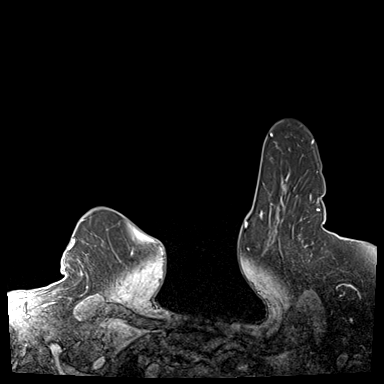
[im 144/144]
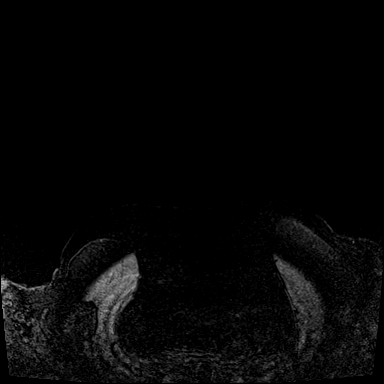

[Series 8: dynamic post 3 · axial · 1.3mm · 0.73mm/px · z∈[-48,+138]mm · 5 of 144 slices shown (2 of 2)]
[im 1/144]
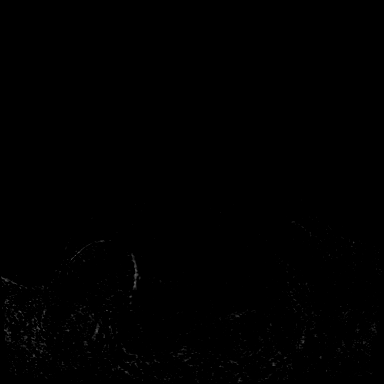
[im 36/144]
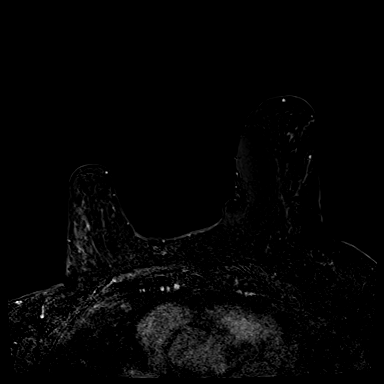
[im 72/144]
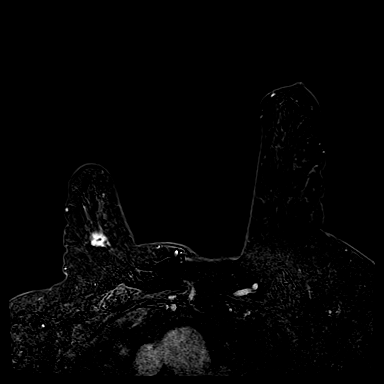
[im 108/144]
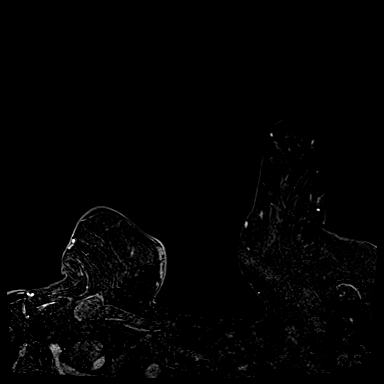
[im 144/144]
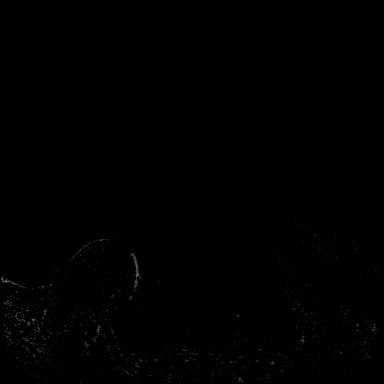

[Series 9: needle confirmation · axial · 1.3mm · 0.73mm/px · 1 of 144 slices shown]
[im 1/144]
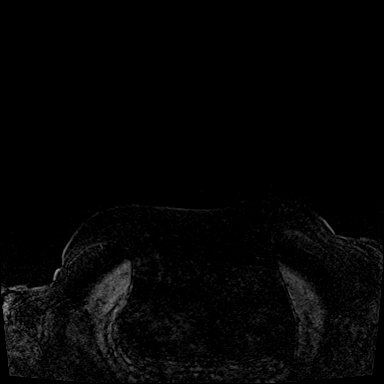

[29 of 48 positions shown; findings below may reference images not displayed]

FINDINGS: I met with the patient, and we discussed the procedure of MRI guided
biopsy, including risks, benefits, and alternatives. Specifically,
we discussed the risks of infection, bleeding, tissue injury, clip
migration, and inadequate sampling. Informed, written consent was
given. The usual time out protocol was performed immediately prior
to the procedure.

SITE 1: LEFT BREAST INNER POSTERIOR LINEAR NON MASS ENHANCEMENT:
Using sterile technique, 1% Lidocaine, MRI guidance, and a 9 gauge
vacuum assisted device, biopsy was performed of the linear oriented
non mass enhancement/enhancing foci using a lateral to medial
approach. At the conclusion of the procedure, a barbell shaped
tissue marker clip was deployed into the biopsy cavity. Follow-up
2-view mammogram was performed and dictated separately.

SITE 2: LEFT BREAST INNER ANTERIOR LINEAR NON MASS ENHANCEMENT:
Using sterile technique, 1% Lidocaine, MRI guidance, and a 9 gauge
vacuum assisted device, biopsy was performed of the linear oriented
non mass enhancement/enhancing foci using a lateral to medial
approach. At the conclusion of the procedure, a cylindrical shaped
tissue marker clip was deployed into the biopsy cavity. Follow-up
2-view mammogram was performed and dictated separately.

SITE 3: RIGHT BREAST UPPER OUTER NON MASS ENHANCEMENT: Using sterile
technique, 1% Lidocaine, MRI guidance, and a 9 gauge vacuum assisted
device, biopsy was performed of the non mass enhancement in the
upper outer quadrant of the right breast near the lumpectomy site
using a lateral to medial approach. At the conclusion of the
procedure, a barbell shaped tissue marker clip was deployed into the
biopsy cavity. Follow-up 2-view mammogram was performed and dictated
separately.
IMPRESSION: 1. MRI guided biopsy of the linear non mass enhancement/enhancing
foci in the inner posterior left breast, at site of barbell shaped
biopsy marking clip.

2. MRI guided biopsy of the linear non mass enhancement/enhancing
foci in the inner anterior left breast, at site of cylindrical
shaped biopsy marking clip.

3. MRI guided biopsy of the non mass enhancement in the upper-outer
quadrant of the right breast near the lumpectomy site, at site of
barbell shaped biopsy marking clip.

ADDENDUM:
Pathology revealed BENIGN BREAST TISSUE WITH FIBROCYSTIC CHANGES AND
STROMAL FIBROSIS, MICROCALCIFICATIONS of the LEFT breast, inner
posterior. This was found to be concordant by Dr. SHA ABAN.

Pathology revealed BENIGN BREAST TISSUE WITH STROMAL FIBROSIS of the
LEFT breast, inner anterior. This was found to be concordant by Dr.
SHA ABAN.

Pathology revealed GRADE II INVASIVE MAMMARY CARCINOMA, MAMMARY
CARCINOMA IN-SITU of the RIGHT breast, upper outer quadrant.
E-cadherin is NEGATIVE supporting lobular origin. This was found to
be concordant by Dr. SHA ABAN.

Multiple attempts to reach the patient were unsuccessful, [DATE] x 5 times, [DATE] x 3 times, and lastly on [DATE].

SHA ABAN, CMA with Dr. SHA ABAN at [[email or web address]], contacted the patient by telephone and discussed biopsy
results.

The patient has a recent diagnosis of RIGHT breast cancer and should
follow her outlined treatment plan with Dr. SHA ABAN.

Pathology results reported by SHA ABAN, RN on [DATE].

*** End of Addendum ***
FINDINGS: I met with the patient, and we discussed the procedure of MRI guided
biopsy, including risks, benefits, and alternatives. Specifically,
we discussed the risks of infection, bleeding, tissue injury, clip
migration, and inadequate sampling. Informed, written consent was
given. The usual time out protocol was performed immediately prior
to the procedure.

SITE 1: LEFT BREAST INNER POSTERIOR LINEAR NON MASS ENHANCEMENT:
Using sterile technique, 1% Lidocaine, MRI guidance, and a 9 gauge
vacuum assisted device, biopsy was performed of the linear oriented
non mass enhancement/enhancing foci using a lateral to medial
approach. At the conclusion of the procedure, a barbell shaped
tissue marker clip was deployed into the biopsy cavity. Follow-up
2-view mammogram was performed and dictated separately.

SITE 2: LEFT BREAST INNER ANTERIOR LINEAR NON MASS ENHANCEMENT:
Using sterile technique, 1% Lidocaine, MRI guidance, and a 9 gauge
vacuum assisted device, biopsy was performed of the linear oriented
non mass enhancement/enhancing foci using a lateral to medial
approach. At the conclusion of the procedure, a cylindrical shaped
tissue marker clip was deployed into the biopsy cavity. Follow-up
2-view mammogram was performed and dictated separately.

SITE 3: RIGHT BREAST UPPER OUTER NON MASS ENHANCEMENT: Using sterile
technique, 1% Lidocaine, MRI guidance, and a 9 gauge vacuum assisted
device, biopsy was performed of the non mass enhancement in the
upper outer quadrant of the right breast near the lumpectomy site
using a lateral to medial approach. At the conclusion of the
procedure, a barbell shaped tissue marker clip was deployed into the
biopsy cavity. Follow-up 2-view mammogram was performed and dictated
separately.
IMPRESSION: 1. MRI guided biopsy of the linear non mass enhancement/enhancing
foci in the inner posterior left breast, at site of barbell shaped
biopsy marking clip.

2. MRI guided biopsy of the linear non mass enhancement/enhancing
foci in the inner anterior left breast, at site of cylindrical
shaped biopsy marking clip.

3. MRI guided biopsy of the non mass enhancement in the upper-outer
quadrant of the right breast near the lumpectomy site, at site of
barbell shaped biopsy marking clip.

## 2019-08-11 IMAGING — MG MM BREAST LOCALIZATION CLIP
4 series · 5 of 12 positions shown · non-contrast
Comparison: Previous exams.
COMPARISON: Previous exams.

Addendum:
CLINICAL DATA: Post MRI guided biopsy of linear non mass
enhancement in the inner posterior left breast, MRI guided biopsy of
linear non mass enhancement in the inner anterior left breast, and
MRI guided biopsy of non mass enhancement in the upper outer right
breast near the lumpectomy site.

EXAM:
DIAGNOSTIC BILATERAL MAMMOGRAM POST MRI BIOPSY

[R ML synth-2D]
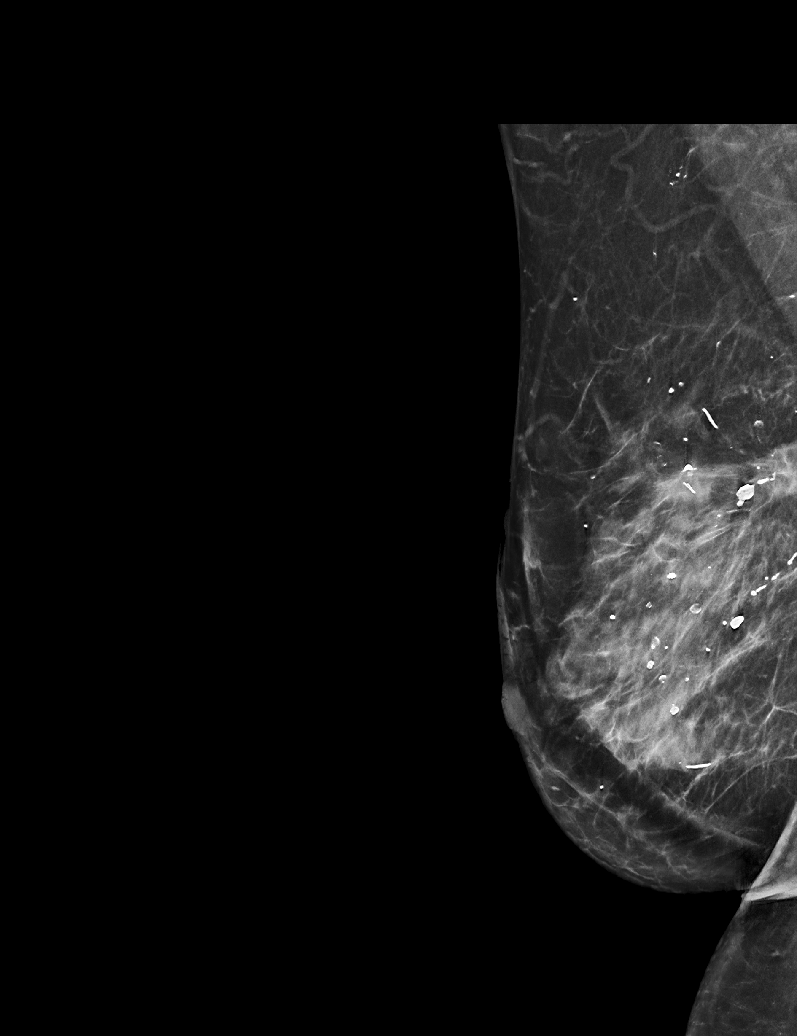

[R CC synth-2D]
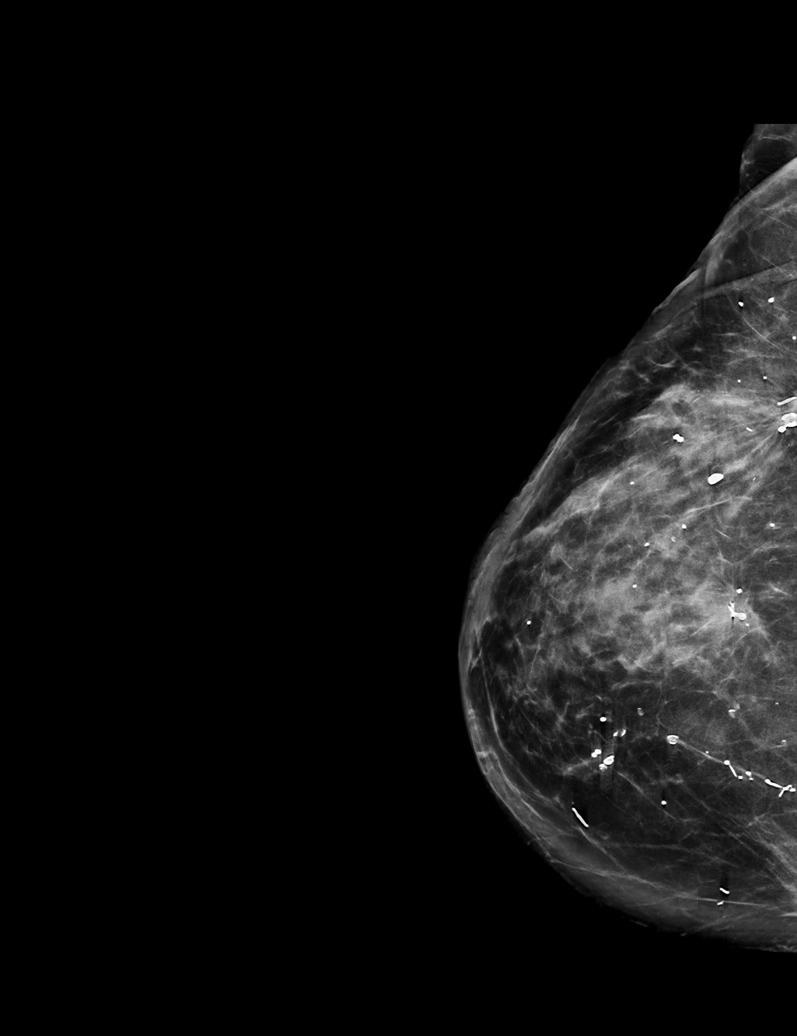

[R CC tomo · 2 of 63 frames shown]
[frame 21/63]
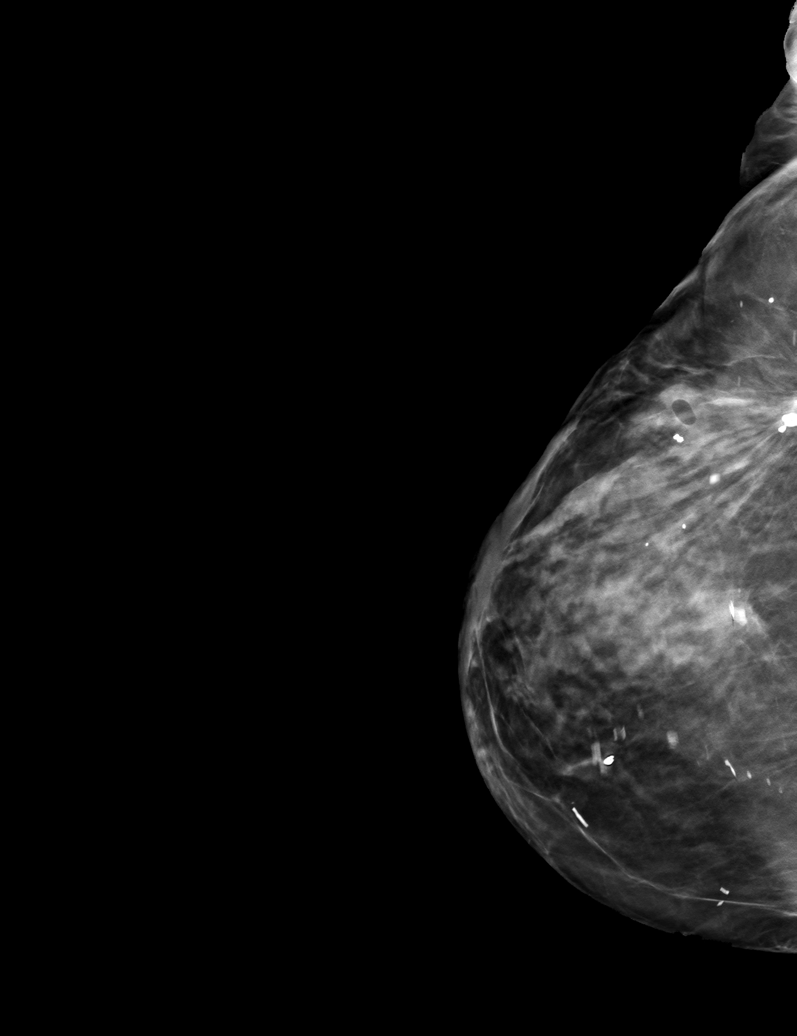
[frame 32/63]
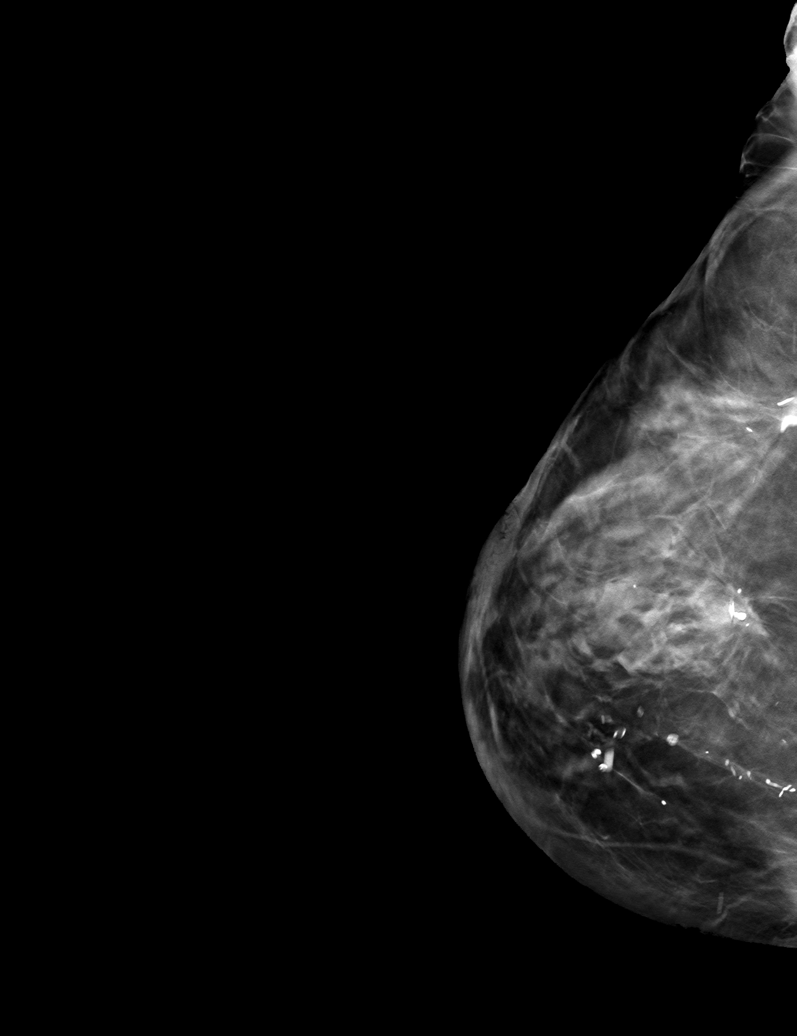

[R ML tomo · tomo slice 33/66.0]
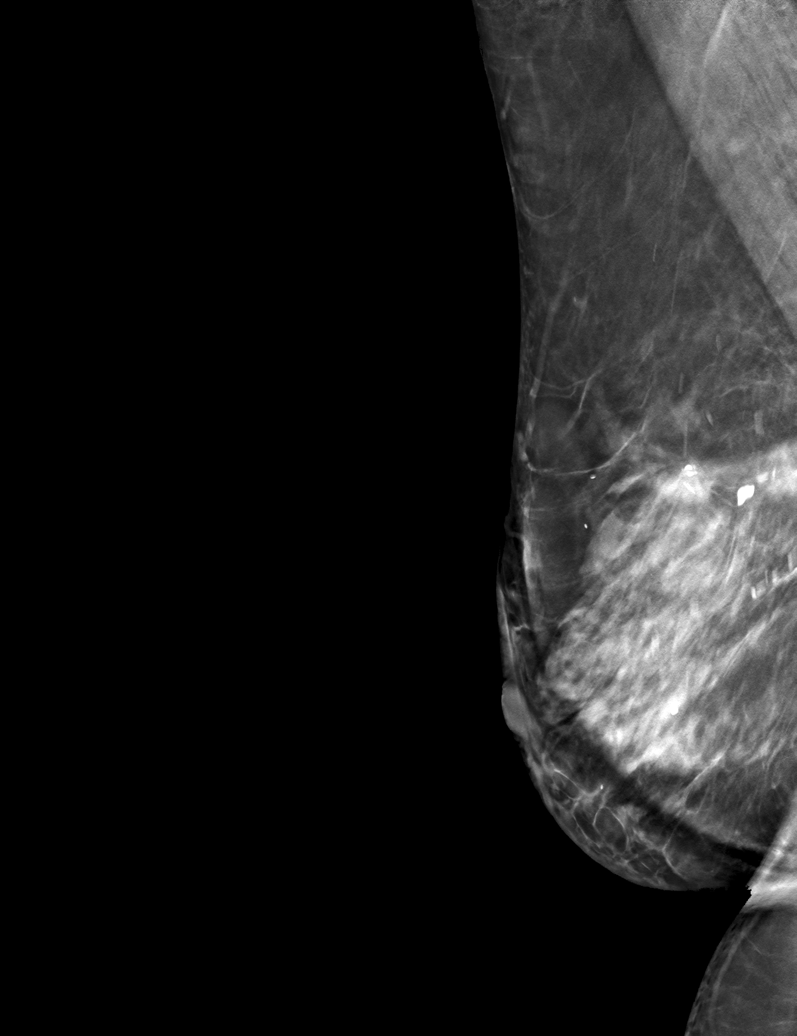

[5 of 12 positions shown; findings below may reference images not displayed]

FINDINGS: Mammographic images were obtained following MRI guided biopsy of
linear non mass enhancement in the inner posterior left breast, MRI
guided biopsy of linear non mass enhancement in the inner anterior
left breast, and MRI guided biopsy of non mass enhancement in the
upper outer right breast near the lumpectomy site.

1. A barbell shaped biopsy marking clip is present at the site of
the biopsied linear non mass enhancement in the inner posterior left
breast.

2. A cylindrical shaped biopsy marking clip is present at the site
of the biopsied linear non mass enhancement in the inner anterior
left breast.

3. A barbell shaped biopsy marking clip is present at the site of
the biopsied non mass enhancement in the upper outer right breast
near the lumpectomy site.
IMPRESSION: 1. Barbell shaped biopsy marking clip at site of biopsied linear non
mass enhancement in the inner posterior left breast.

2. Cylindrical shaped biopsy marking clip at site of biopsied linear
non mass enhancement in the inner anterior left breast.

3. Barbell shaped biopsy marking clip at site of biopsied non mass
enhancement in the upper outer right breast near the lumpectomy
site.

Final Assessment: Post Procedure Mammograms for Marker Placement

ADDENDUM:
Pathology revealed BENIGN BREAST TISSUE WITH FIBROCYSTIC CHANGES AND
STROMAL FIBROSIS, MICROCALCIFICATIONS of the LEFT breast, inner
posterior. This was found to be concordant by Dr. VINCENZA.

Pathology revealed BENIGN BREAST TISSUE WITH STROMAL FIBROSIS of the
LEFT breast, inner anterior. This was found to be concordant by Dr.
VINCENZA.

Pathology revealed GRADE II INVASIVE MAMMARY CARCINOMA, MAMMARY
CARCINOMA IN-SITU of the RIGHT breast, upper outer quadrant.
E-cadherin is NEGATIVE supporting lobular origin. This was found to
be concordant by Dr. VINCENZA.

Multiple attempts to reach the patient were unsuccessful, [DATE] x 5 times, [DATE] x 3 times, and lastly on [DATE].

VINCENZA, CMA with Dr. VINCENZA at [REDACTED], contacted the patient by telephone and discussed biopsy
results.

The patient has a recent diagnosis of RIGHT breast cancer and should
follow her outlined treatment plan with Dr. VINCENZA.

Pathology results reported by VINCENZA, RN on [DATE].

*** End of Addendum ***
FINDINGS: Mammographic images were obtained following MRI guided biopsy of
linear non mass enhancement in the inner posterior left breast, MRI
guided biopsy of linear non mass enhancement in the inner anterior
left breast, and MRI guided biopsy of non mass enhancement in the
upper outer right breast near the lumpectomy site.

1. A barbell shaped biopsy marking clip is present at the site of
the biopsied linear non mass enhancement in the inner posterior left
breast.

2. A cylindrical shaped biopsy marking clip is present at the site
of the biopsied linear non mass enhancement in the inner anterior
left breast.

3. A barbell shaped biopsy marking clip is present at the site of
the biopsied non mass enhancement in the upper outer right breast
near the lumpectomy site.
IMPRESSION: 1. Barbell shaped biopsy marking clip at site of biopsied linear non
mass enhancement in the inner posterior left breast.

2. Cylindrical shaped biopsy marking clip at site of biopsied linear
non mass enhancement in the inner anterior left breast.

3. Barbell shaped biopsy marking clip at site of biopsied non mass
enhancement in the upper outer right breast near the lumpectomy
site.

Final Assessment: Post Procedure Mammograms for Marker Placement

## 2019-08-11 IMAGING — MR MR BREAST BX W LOC DEV EA ADD LESION IMAGE BX SPEC MR GUIDE*L*
7 of 10 series · 29 of 48 positions shown · IV contrast (7 ml Gadavist)
Comparison: Previous exams.
COMPARISON: Previous exams.

Addendum:
CLINICAL DATA: 78-year-old female with recently diagnosed right
breast cancer at the 12 o'clock position presents for MRI guided
biopsy of suspicious non mass enhancement in the upper-outer
quadrant of the right breast in the region of the prior lumpectomy
site as well as MRI guided biopsy of 2 sites of linear oriented
enhancing foci in the inner left breast.

EXAM:
MRI GUIDED CORE NEEDLE BIOPSY OF THE BILATERAL BREAST
TECHNIQUE: Multiplanar, multisequence MR imaging of the bilateral breasts were
performed both before and after administration of intravenous
contrast.
CONTRAST:  7mL GADAVIST GADOBUTROL 1 MMOL/ML IV SOLN

[Series 3: fiducial bilateral · sagittal · 2.0mm · 1.33mm/px · 4 of 144 slices shown]
[im 1/144]
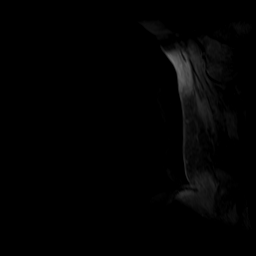
[im 48/144]
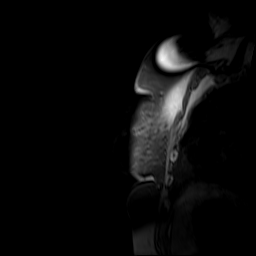
[im 96/144]
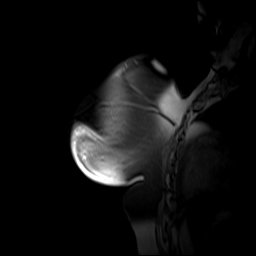
[im 144/144]
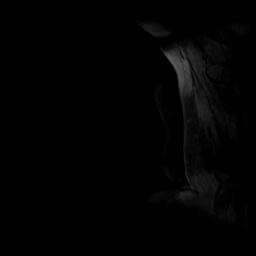

[Series 4: dynamic pre · axial · non-contrast · 1.3mm · 0.73mm/px · z∈[-48,+138]mm · 4 of 144 slices shown]
[im 1/144]
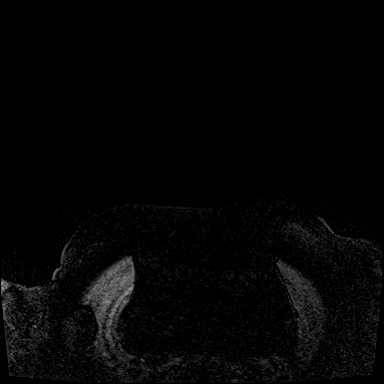
[im 48/144]
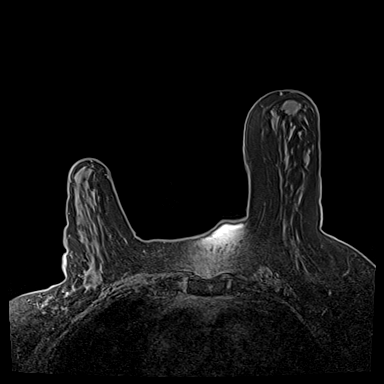
[im 96/144]
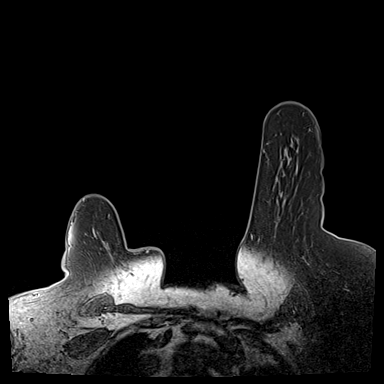
[im 144/144]
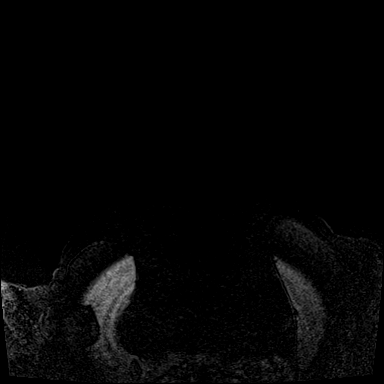

[Series 5: dynamic post 20 · axial · 1.3mm · 0.73mm/px · z∈[-48,+138]mm · 5 of 144 slices shown (1 of 2)]
[im 1/144]
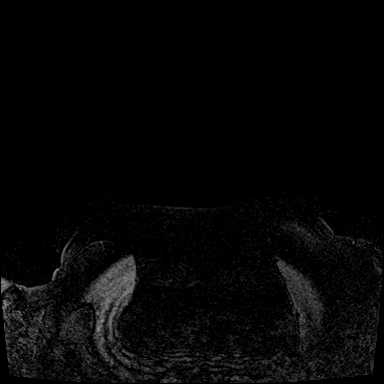
[im 36/144]
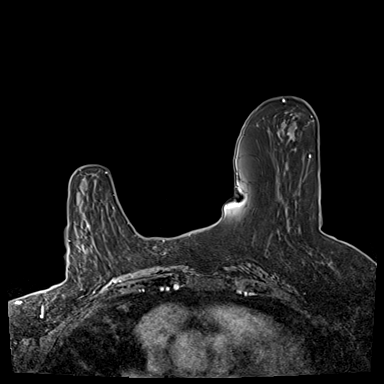
[im 72/144]
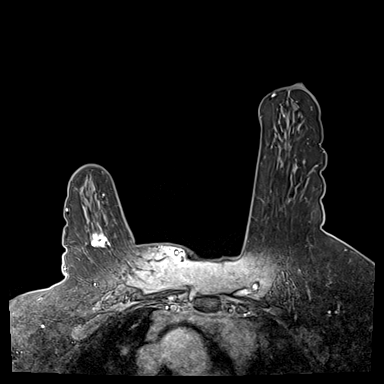
[im 108/144]
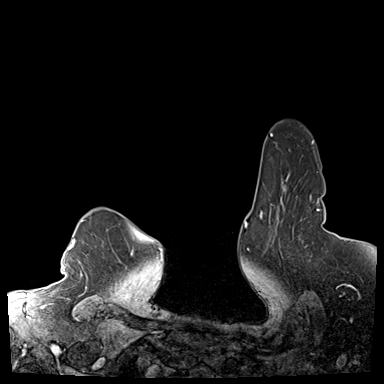
[im 144/144]
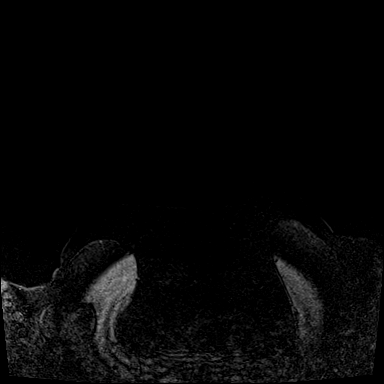

[Series 6: dynamic post 20 · axial · 1.3mm · 0.73mm/px · z∈[-48,+138]mm · 5 of 144 slices shown (2 of 2)]
[im 1/144]
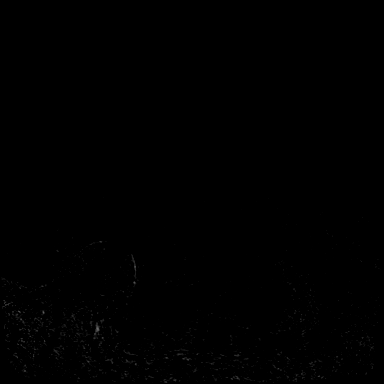
[im 36/144]
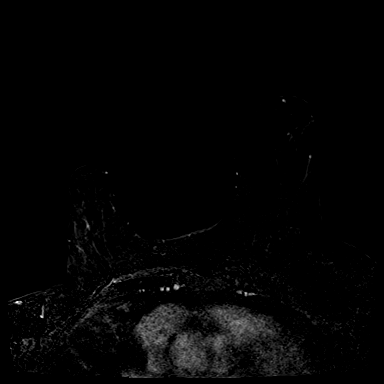
[im 72/144]
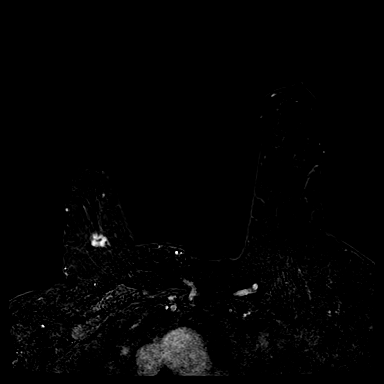
[im 108/144]
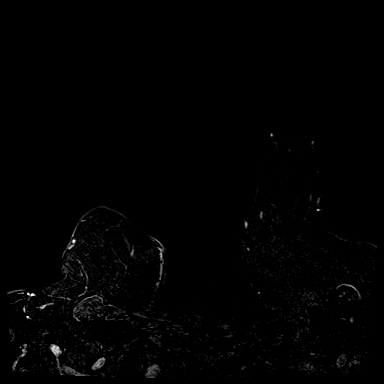
[im 144/144]
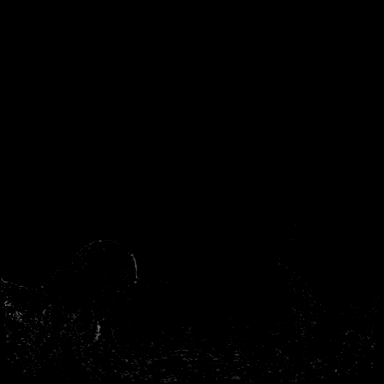

[Series 7: dynamic post 3 · axial · 1.3mm · 0.73mm/px · z∈[-48,+138]mm · 5 of 144 slices shown (1 of 2)]
[im 1/144]
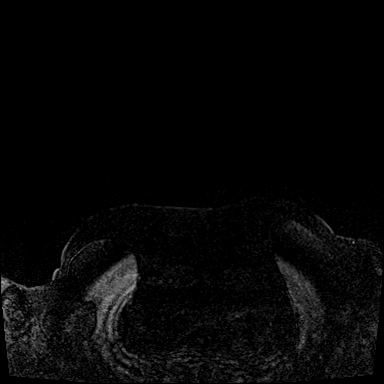
[im 36/144]
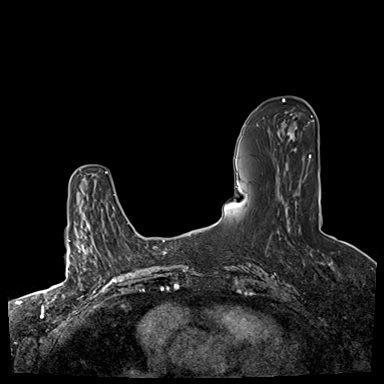
[im 72/144]
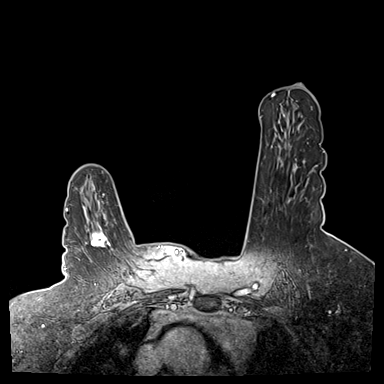
[im 108/144]
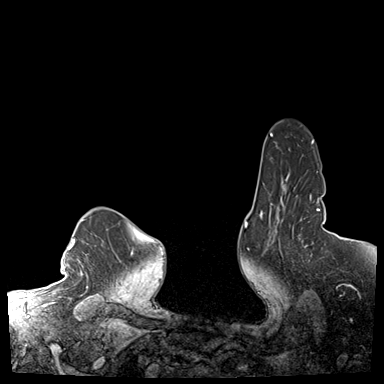
[im 144/144]
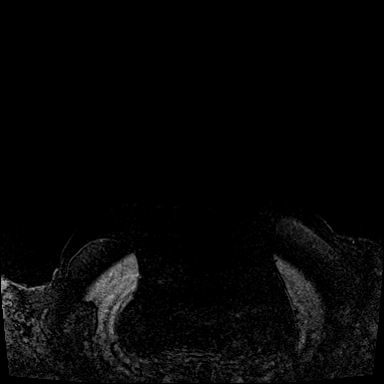

[Series 8: dynamic post 3 · axial · 1.3mm · 0.73mm/px · z∈[-48,+138]mm · 5 of 144 slices shown (2 of 2)]
[im 1/144]
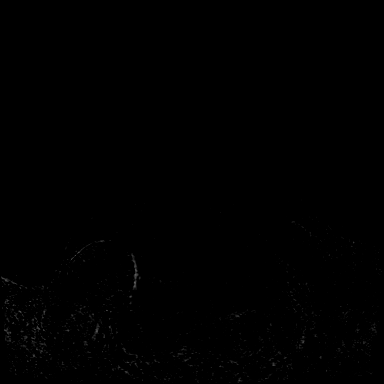
[im 36/144]
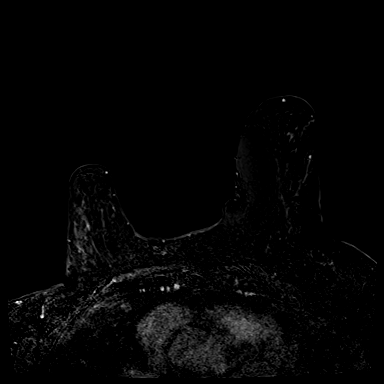
[im 72/144]
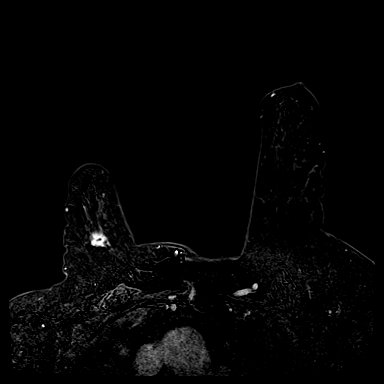
[im 108/144]
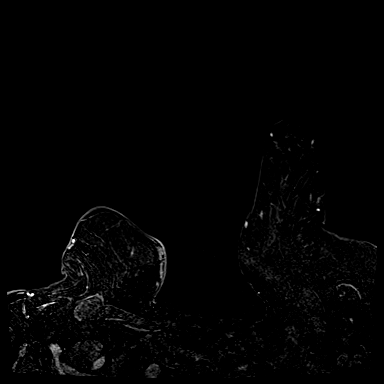
[im 144/144]
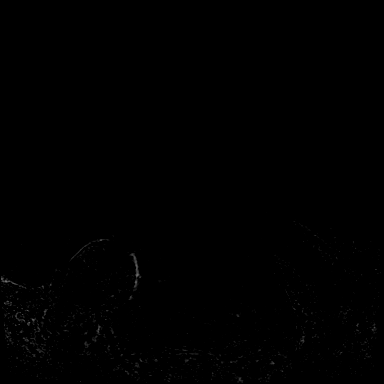

[Series 9: needle confirmation · axial · 1.3mm · 0.73mm/px · 1 of 144 slices shown]
[im 1/144]
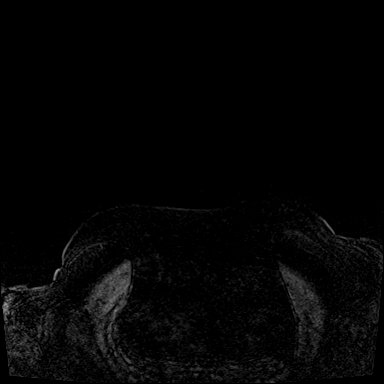

[29 of 48 positions shown; findings below may reference images not displayed]

FINDINGS: I met with the patient, and we discussed the procedure of MRI guided
biopsy, including risks, benefits, and alternatives. Specifically,
we discussed the risks of infection, bleeding, tissue injury, clip
migration, and inadequate sampling. Informed, written consent was
given. The usual time out protocol was performed immediately prior
to the procedure.

SITE 1: LEFT BREAST INNER POSTERIOR LINEAR NON MASS ENHANCEMENT:
Using sterile technique, 1% Lidocaine, MRI guidance, and a 9 gauge
vacuum assisted device, biopsy was performed of the linear oriented
non mass enhancement/enhancing foci using a lateral to medial
approach. At the conclusion of the procedure, a barbell shaped
tissue marker clip was deployed into the biopsy cavity. Follow-up
2-view mammogram was performed and dictated separately.

SITE 2: LEFT BREAST INNER ANTERIOR LINEAR NON MASS ENHANCEMENT:
Using sterile technique, 1% Lidocaine, MRI guidance, and a 9 gauge
vacuum assisted device, biopsy was performed of the linear oriented
non mass enhancement/enhancing foci using a lateral to medial
approach. At the conclusion of the procedure, a cylindrical shaped
tissue marker clip was deployed into the biopsy cavity. Follow-up
2-view mammogram was performed and dictated separately.

SITE 3: RIGHT BREAST UPPER OUTER NON MASS ENHANCEMENT: Using sterile
technique, 1% Lidocaine, MRI guidance, and a 9 gauge vacuum assisted
device, biopsy was performed of the non mass enhancement in the
upper outer quadrant of the right breast near the lumpectomy site
using a lateral to medial approach. At the conclusion of the
procedure, a barbell shaped tissue marker clip was deployed into the
biopsy cavity. Follow-up 2-view mammogram was performed and dictated
separately.
IMPRESSION: 1. MRI guided biopsy of the linear non mass enhancement/enhancing
foci in the inner posterior left breast, at site of barbell shaped
biopsy marking clip.

2. MRI guided biopsy of the linear non mass enhancement/enhancing
foci in the inner anterior left breast, at site of cylindrical
shaped biopsy marking clip.

3. MRI guided biopsy of the non mass enhancement in the upper-outer
quadrant of the right breast near the lumpectomy site, at site of
barbell shaped biopsy marking clip.

ADDENDUM:
Pathology revealed BENIGN BREAST TISSUE WITH FIBROCYSTIC CHANGES AND
STROMAL FIBROSIS, MICROCALCIFICATIONS of the LEFT breast, inner
posterior. This was found to be concordant by Dr. SHA ABAN.

Pathology revealed BENIGN BREAST TISSUE WITH STROMAL FIBROSIS of the
LEFT breast, inner anterior. This was found to be concordant by Dr.
SHA ABAN.

Pathology revealed GRADE II INVASIVE MAMMARY CARCINOMA, MAMMARY
CARCINOMA IN-SITU of the RIGHT breast, upper outer quadrant.
E-cadherin is NEGATIVE supporting lobular origin. This was found to
be concordant by Dr. SHA ABAN.

Multiple attempts to reach the patient were unsuccessful, [DATE] x 5 times, [DATE] x 3 times, and lastly on [DATE].

SHA ABAN, CMA with Dr. SHA ABAN at [REDACTED], contacted the patient by telephone and discussed biopsy
results.

The patient has a recent diagnosis of RIGHT breast cancer and should
follow her outlined treatment plan with Dr. SHA ABAN.

Pathology results reported by SHA ABAN, RN on [DATE].

*** End of Addendum ***
FINDINGS: I met with the patient, and we discussed the procedure of MRI guided
biopsy, including risks, benefits, and alternatives. Specifically,
we discussed the risks of infection, bleeding, tissue injury, clip
migration, and inadequate sampling. Informed, written consent was
given. The usual time out protocol was performed immediately prior
to the procedure.

SITE 1: LEFT BREAST INNER POSTERIOR LINEAR NON MASS ENHANCEMENT:
Using sterile technique, 1% Lidocaine, MRI guidance, and a 9 gauge
vacuum assisted device, biopsy was performed of the linear oriented
non mass enhancement/enhancing foci using a lateral to medial
approach. At the conclusion of the procedure, a barbell shaped
tissue marker clip was deployed into the biopsy cavity. Follow-up
2-view mammogram was performed and dictated separately.

SITE 2: LEFT BREAST INNER ANTERIOR LINEAR NON MASS ENHANCEMENT:
Using sterile technique, 1% Lidocaine, MRI guidance, and a 9 gauge
vacuum assisted device, biopsy was performed of the linear oriented
non mass enhancement/enhancing foci using a lateral to medial
approach. At the conclusion of the procedure, a cylindrical shaped
tissue marker clip was deployed into the biopsy cavity. Follow-up
2-view mammogram was performed and dictated separately.

SITE 3: RIGHT BREAST UPPER OUTER NON MASS ENHANCEMENT: Using sterile
technique, 1% Lidocaine, MRI guidance, and a 9 gauge vacuum assisted
device, biopsy was performed of the non mass enhancement in the
upper outer quadrant of the right breast near the lumpectomy site
using a lateral to medial approach. At the conclusion of the
procedure, a barbell shaped tissue marker clip was deployed into the
biopsy cavity. Follow-up 2-view mammogram was performed and dictated
separately.
IMPRESSION: 1. MRI guided biopsy of the linear non mass enhancement/enhancing
foci in the inner posterior left breast, at site of barbell shaped
biopsy marking clip.

2. MRI guided biopsy of the linear non mass enhancement/enhancing
foci in the inner anterior left breast, at site of cylindrical
shaped biopsy marking clip.

3. MRI guided biopsy of the non mass enhancement in the upper-outer
quadrant of the right breast near the lumpectomy site, at site of
barbell shaped biopsy marking clip.

## 2019-08-11 IMAGING — MR MR BREAST BX W LOC DEV 1ST LESION IMAGE BX SPEC MR GUIDE*L*
7 of 10 series · 29 of 48 positions shown · IV contrast (7 ml Gadavist)
Comparison: Previous exams.
COMPARISON: Previous exams.

Addendum:
CLINICAL DATA: 78-year-old female with recently diagnosed right
breast cancer at the 12 o'clock position presents for MRI guided
biopsy of suspicious non mass enhancement in the upper-outer
quadrant of the right breast in the region of the prior lumpectomy
site as well as MRI guided biopsy of 2 sites of linear oriented
enhancing foci in the inner left breast.

EXAM:
MRI GUIDED CORE NEEDLE BIOPSY OF THE BILATERAL BREAST
TECHNIQUE: Multiplanar, multisequence MR imaging of the bilateral breasts were
performed both before and after administration of intravenous
contrast.
CONTRAST:  7mL GADAVIST GADOBUTROL 1 MMOL/ML IV SOLN

[Series 3: fiducial bilateral · sagittal · 2.0mm · 1.33mm/px · 4 of 144 slices shown]
[im 1/144]
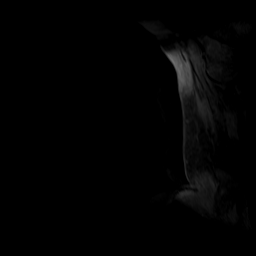
[im 48/144]
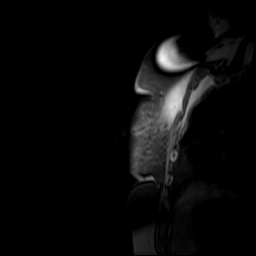
[im 96/144]
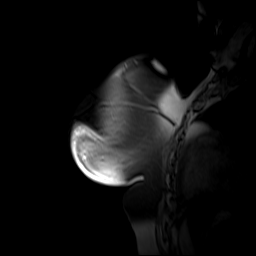
[im 144/144]
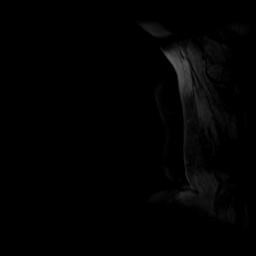

[Series 4: dynamic pre · axial · non-contrast · 1.3mm · 0.73mm/px · z∈[-48,+138]mm · 4 of 144 slices shown]
[im 1/144]
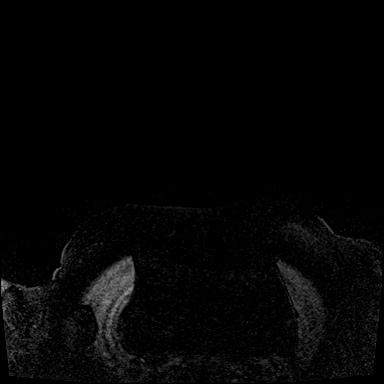
[im 48/144]
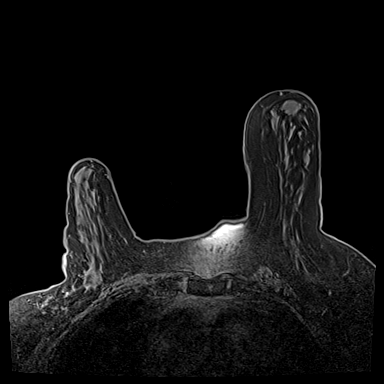
[im 96/144]
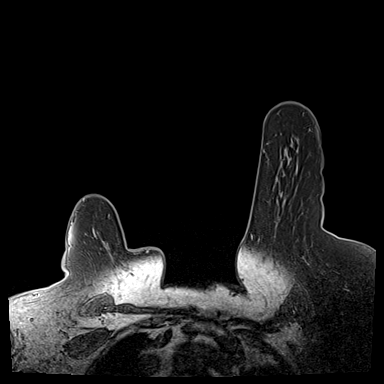
[im 144/144]
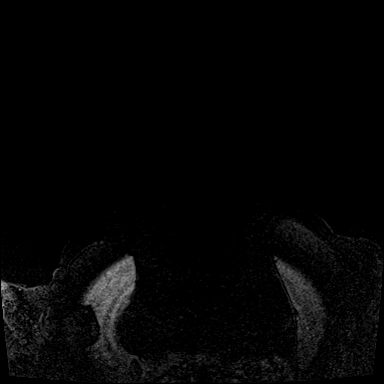

[Series 5: dynamic post 20 · axial · 1.3mm · 0.73mm/px · z∈[-48,+138]mm · 5 of 144 slices shown (1 of 2)]
[im 1/144]
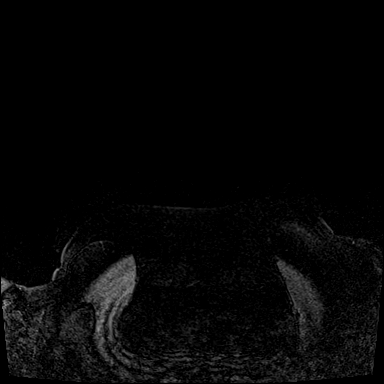
[im 36/144]
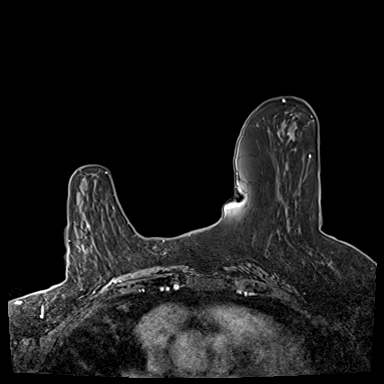
[im 72/144]
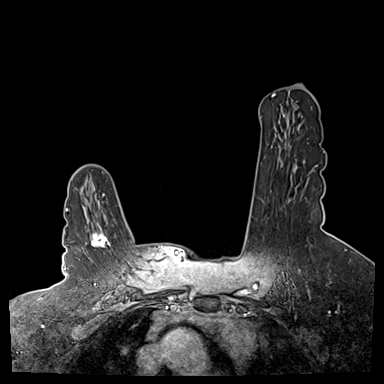
[im 108/144]
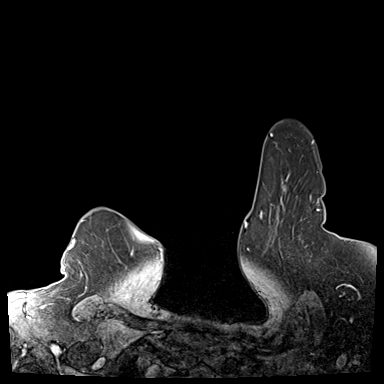
[im 144/144]
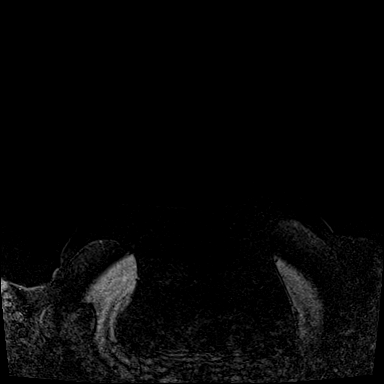

[Series 6: dynamic post 20 · axial · 1.3mm · 0.73mm/px · z∈[-48,+138]mm · 5 of 144 slices shown (2 of 2)]
[im 1/144]
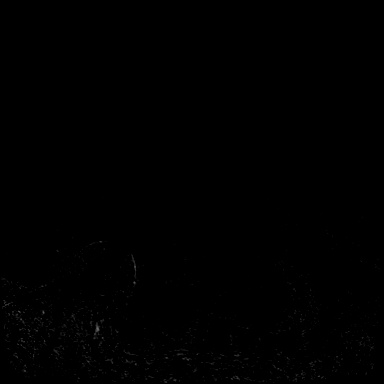
[im 36/144]
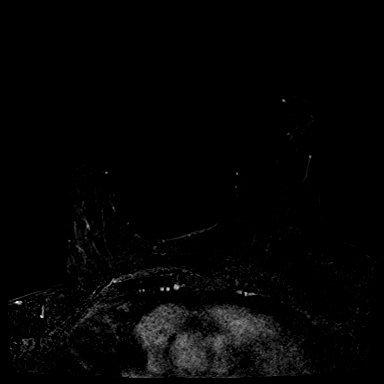
[im 72/144]
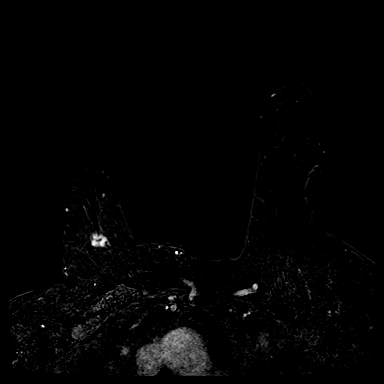
[im 108/144]
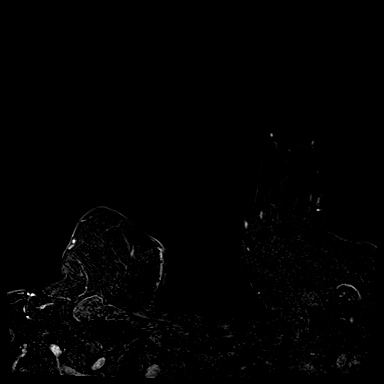
[im 144/144]
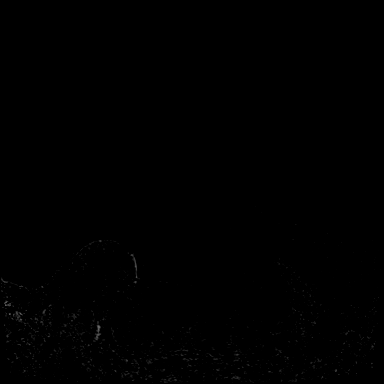

[Series 7: dynamic post 3 · axial · 1.3mm · 0.73mm/px · z∈[-48,+138]mm · 5 of 144 slices shown (1 of 2)]
[im 1/144]
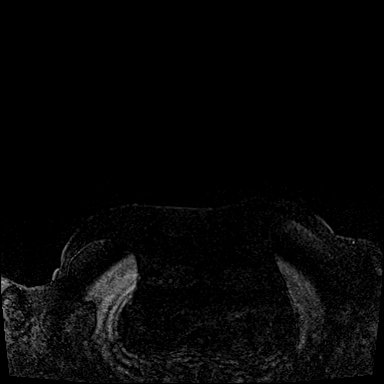
[im 36/144]
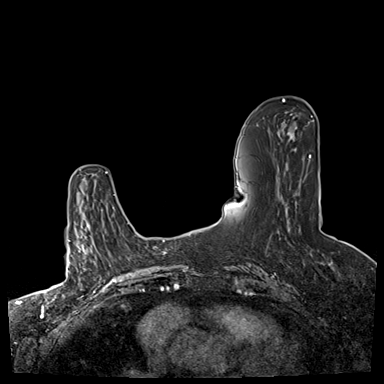
[im 72/144]
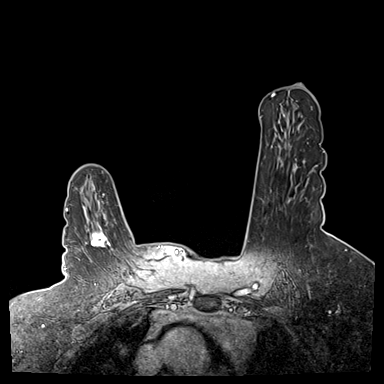
[im 108/144]
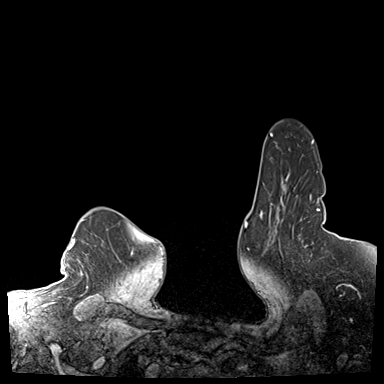
[im 144/144]
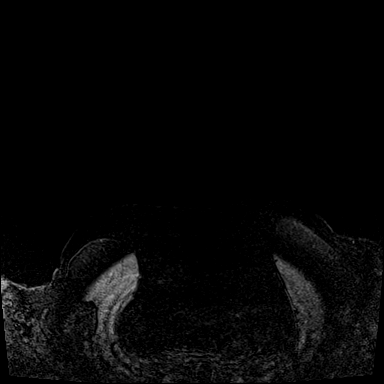

[Series 8: dynamic post 3 · axial · 1.3mm · 0.73mm/px · z∈[-48,+138]mm · 5 of 144 slices shown (2 of 2)]
[im 1/144]
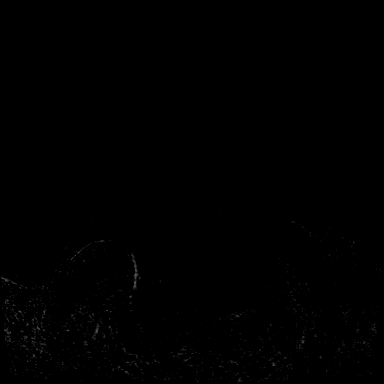
[im 36/144]
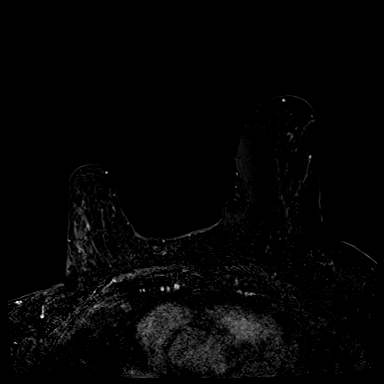
[im 72/144]
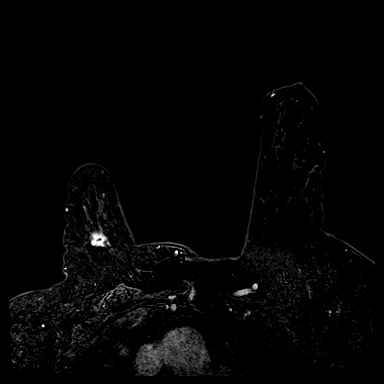
[im 108/144]
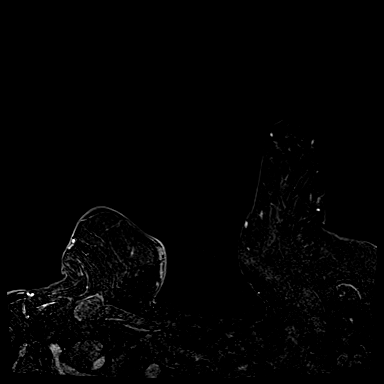
[im 144/144]
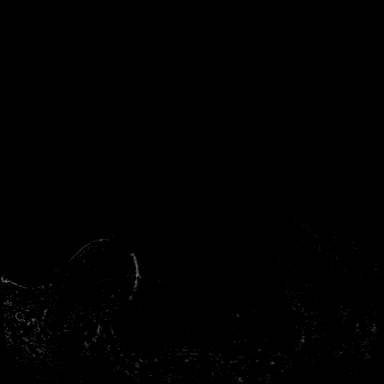

[Series 9: needle confirmation · axial · 1.3mm · 0.73mm/px · 1 of 144 slices shown]
[im 1/144]
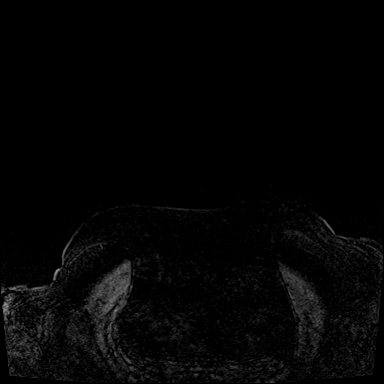

[29 of 48 positions shown; findings below may reference images not displayed]

FINDINGS: I met with the patient, and we discussed the procedure of MRI guided
biopsy, including risks, benefits, and alternatives. Specifically,
we discussed the risks of infection, bleeding, tissue injury, clip
migration, and inadequate sampling. Informed, written consent was
given. The usual time out protocol was performed immediately prior
to the procedure.

SITE 1: LEFT BREAST INNER POSTERIOR LINEAR NON MASS ENHANCEMENT:
Using sterile technique, 1% Lidocaine, MRI guidance, and a 9 gauge
vacuum assisted device, biopsy was performed of the linear oriented
non mass enhancement/enhancing foci using a lateral to medial
approach. At the conclusion of the procedure, a barbell shaped
tissue marker clip was deployed into the biopsy cavity. Follow-up
2-view mammogram was performed and dictated separately.

SITE 2: LEFT BREAST INNER ANTERIOR LINEAR NON MASS ENHANCEMENT:
Using sterile technique, 1% Lidocaine, MRI guidance, and a 9 gauge
vacuum assisted device, biopsy was performed of the linear oriented
non mass enhancement/enhancing foci using a lateral to medial
approach. At the conclusion of the procedure, a cylindrical shaped
tissue marker clip was deployed into the biopsy cavity. Follow-up
2-view mammogram was performed and dictated separately.

SITE 3: RIGHT BREAST UPPER OUTER NON MASS ENHANCEMENT: Using sterile
technique, 1% Lidocaine, MRI guidance, and a 9 gauge vacuum assisted
device, biopsy was performed of the non mass enhancement in the
upper outer quadrant of the right breast near the lumpectomy site
using a lateral to medial approach. At the conclusion of the
procedure, a barbell shaped tissue marker clip was deployed into the
biopsy cavity. Follow-up 2-view mammogram was performed and dictated
separately.
IMPRESSION: 1. MRI guided biopsy of the linear non mass enhancement/enhancing
foci in the inner posterior left breast, at site of barbell shaped
biopsy marking clip.

2. MRI guided biopsy of the linear non mass enhancement/enhancing
foci in the inner anterior left breast, at site of cylindrical
shaped biopsy marking clip.

3. MRI guided biopsy of the non mass enhancement in the upper-outer
quadrant of the right breast near the lumpectomy site, at site of
barbell shaped biopsy marking clip.

ADDENDUM:
Pathology revealed BENIGN BREAST TISSUE WITH FIBROCYSTIC CHANGES AND
STROMAL FIBROSIS, MICROCALCIFICATIONS of the LEFT breast, inner
posterior. This was found to be concordant by Dr. SHA ABAN.

Pathology revealed BENIGN BREAST TISSUE WITH STROMAL FIBROSIS of the
LEFT breast, inner anterior. This was found to be concordant by Dr.
SHA ABAN.

Pathology revealed GRADE II INVASIVE MAMMARY CARCINOMA, MAMMARY
CARCINOMA IN-SITU of the RIGHT breast, upper outer quadrant.
E-cadherin is NEGATIVE supporting lobular origin. This was found to
be concordant by Dr. SHA ABAN.

Multiple attempts to reach the patient were unsuccessful, [DATE] x 5 times, [DATE] x 3 times, and lastly on [DATE].

SHA ABAN, CMA with Dr. SHA ABAN at [REDACTED], contacted the patient by telephone and discussed biopsy
results.

The patient has a recent diagnosis of RIGHT breast cancer and should
follow her outlined treatment plan with Dr. SHA ABAN.

Pathology results reported by SHA ABAN, RN on [DATE].

*** End of Addendum ***
FINDINGS: I met with the patient, and we discussed the procedure of MRI guided
biopsy, including risks, benefits, and alternatives. Specifically,
we discussed the risks of infection, bleeding, tissue injury, clip
migration, and inadequate sampling. Informed, written consent was
given. The usual time out protocol was performed immediately prior
to the procedure.

SITE 1: LEFT BREAST INNER POSTERIOR LINEAR NON MASS ENHANCEMENT:
Using sterile technique, 1% Lidocaine, MRI guidance, and a 9 gauge
vacuum assisted device, biopsy was performed of the linear oriented
non mass enhancement/enhancing foci using a lateral to medial
approach. At the conclusion of the procedure, a barbell shaped
tissue marker clip was deployed into the biopsy cavity. Follow-up
2-view mammogram was performed and dictated separately.

SITE 2: LEFT BREAST INNER ANTERIOR LINEAR NON MASS ENHANCEMENT:
Using sterile technique, 1% Lidocaine, MRI guidance, and a 9 gauge
vacuum assisted device, biopsy was performed of the linear oriented
non mass enhancement/enhancing foci using a lateral to medial
approach. At the conclusion of the procedure, a cylindrical shaped
tissue marker clip was deployed into the biopsy cavity. Follow-up
2-view mammogram was performed and dictated separately.

SITE 3: RIGHT BREAST UPPER OUTER NON MASS ENHANCEMENT: Using sterile
technique, 1% Lidocaine, MRI guidance, and a 9 gauge vacuum assisted
device, biopsy was performed of the non mass enhancement in the
upper outer quadrant of the right breast near the lumpectomy site
using a lateral to medial approach. At the conclusion of the
procedure, a barbell shaped tissue marker clip was deployed into the
biopsy cavity. Follow-up 2-view mammogram was performed and dictated
separately.
IMPRESSION: 1. MRI guided biopsy of the linear non mass enhancement/enhancing
foci in the inner posterior left breast, at site of barbell shaped
biopsy marking clip.

2. MRI guided biopsy of the linear non mass enhancement/enhancing
foci in the inner anterior left breast, at site of cylindrical
shaped biopsy marking clip.

3. MRI guided biopsy of the non mass enhancement in the upper-outer
quadrant of the right breast near the lumpectomy site, at site of
barbell shaped biopsy marking clip.

## 2019-08-11 MED ORDER — GADOBUTROL 1 MMOL/ML IV SOLN
7.0000 mL | Freq: Once | INTRAVENOUS | Status: AC | PRN
  Administered 2019-08-11: 7 mL via INTRAVENOUS

## 2019-08-12 ENCOUNTER — Telehealth: Payer: Self-pay | Admitting: Hematology and Oncology

## 2019-08-12 NOTE — Telephone Encounter (Signed)
Scheduled per sch msg. Called and left msg. Mailed printout  

## 2019-08-22 ENCOUNTER — Ambulatory Visit: Payer: Self-pay | Admitting: Surgery

## 2019-08-22 DIAGNOSIS — C50911 Malignant neoplasm of unspecified site of right female breast: Secondary | ICD-10-CM

## 2019-08-31 ENCOUNTER — Telehealth: Payer: Self-pay | Admitting: Hematology and Oncology

## 2019-08-31 NOTE — Telephone Encounter (Signed)
Scheduled appt per 6/22 sch message - no answer left message and mailed reminder letter with appt.

## 2019-09-01 ENCOUNTER — Ambulatory Visit: Payer: Self-pay | Admitting: Surgery

## 2019-09-01 DIAGNOSIS — C50911 Malignant neoplasm of unspecified site of right female breast: Secondary | ICD-10-CM

## 2019-09-01 DIAGNOSIS — Z170 Estrogen receptor positive status [ER+]: Secondary | ICD-10-CM

## 2019-09-01 NOTE — Progress Notes (Signed)
[id number] [place], [place] AT [place] [address] [place] [address] Phone: [phone] Fax: [place], [place] [address] Phone: [phone] Fax: [phone]    Your procedure is scheduled on Wednesday, June 30th.  Report to [facility] Main Entrance "A" at 6:30 A.M., and check in at the Admitting office.  Call this number if you have problems the morning of surgery:  [phone]  Call [phone] if you have any questions prior to your surgery date Monday-Friday 8am-4pm   Remember:  Do not eat after midnight the night before your surgery  You may drink clear liquids until 5:30 A.M. the morning of your surgery.   Clear liquids allowed are: Water, Non-Citrus Juices (without pulp), Carbonated Beverages, Clear Tea, Black Coffee Only, and Gatorade   Please complete your PRE-SURGERY G2  that was provided to you by 5:30 A.M. the morning of surgery.  Please, if able, drink it in one setting. DO NOT SIP.   Take these medicines the morning of surgery with A SIP OF WATER  fenofibrate  levothyroxine (EUTHYROX)  If needed - omeprazole (PRILOSEC)   As of today, STOP taking any Aspirin (unless otherwise instructed by your surgeon) and Aspirin containing products, Aleve, Naproxen, Ibuprofen, Motrin, Advil, Goody's, BC's, all herbal medications, fish oil, and all vitamins.  WHAT DO I DO ABOUT MY DIABETES MEDICATION?  - The morning of surgery Do NOT take metFORMIN (GLUCOPHAGE) or pioglitazone (ACTOS)    HOW TO MANAGE YOUR DIABETES BEFORE AND AFTER SURGERY  Why is it important to control my blood sugar before and after surgery? . Improving blood sugar levels before and after surgery helps healing and can limit problems. . A way of improving blood sugar control is eating a healthy diet by: o  Eating  less sugar and carbohydrates o  Increasing activity/exercise o  Talking with your doctor about reaching your blood sugar goals . High blood sugars (greater than 180 mg/dL) can raise your risk of infections and slow your recovery, so you will need to focus on controlling your diabetes during the weeks before surgery. . Make sure that the doctor who takes care of your diabetes knows about your planned surgery including the date and location.  How do I manage my blood sugar before surgery? . Check your blood sugar at least 4 times a day, starting 2 days before surgery, to make sure that the level is not too high or low. . Check your blood sugar the morning of your surgery when you wake up and every 2 hours until you get to the Short Stay unit. o If your blood sugar is less than 70 mg/dL, you will need to treat for low blood sugar: - Do not take insulin. - Treat a low blood sugar (less than 70 mg/dL) with  cup of clear juice (cranberry or apple), 4 glucose tablets, OR glucose gel. - Recheck blood sugar in 15 minutes after treatment (to make sure it is greater than 70 mg/dL). If your blood sugar is not greater than 70 mg/dL on recheck, call [phone] for further instructions. . Report your blood sugar to the short stay nurse when you get to Short Stay.  . If you are admitted to the hospital after surgery: o Your blood sugar will be checked by the staff and you will probably be given insulin after surgery (  instead of oral diabetes medicines) to make sure you have good blood sugar levels. o The goal for blood sugar control after surgery is 80-180 mg/dL.                     Do not wear jewelry, make up, or nail polish            Do not wear lotions, powders, perfumes, or deodorant.            Do not shave 48 hours prior to surgery.              Do not bring valuables to the hospital.            [facility] is not responsible for any belongings or valuables.  Do NOT Smoke (Tobacco/Vapping) or  drink Alcohol 24 hours prior to your procedure If you use a CPAP at night, you may bring all equipment for your overnight stay.   Contacts, glasses, dentures or bridgework may not be worn into surgery.      For patients admitted to the hospital, discharge time will be determined by your treatment team.   Patients discharged the day of surgery will not be allowed to drive home, and someone needs to stay with them for 24 hours.  Special instructions:   [facility]- Preparing For Surgery  Before surgery, you can play an important role. Because skin is not sterile, your skin needs to be as free of germs as possible. You can reduce the number of germs on your skin by washing with CHG (chlorahexidine gluconate) Soap before surgery.  CHG is an antiseptic cleaner which kills germs and bonds with the skin to continue killing germs even after washing.    Oral Hygiene is also important to reduce your risk of infection.  Remember - BRUSH YOUR TEETH THE MORNING OF SURGERY WITH YOUR REGULAR TOOTHPASTE  Please do not use if you have an allergy to CHG or antibacterial soaps. If your skin becomes reddened/irritated stop using the CHG.  Do not shave (including legs and underarms) for at least 48 hours prior to first CHG shower. It is OK to shave your face.  Please follow these instructions carefully.   1. Shower the NIGHT BEFORE SURGERY and the MORNING OF SURGERY with CHG Soap.   2. If you chose to wash your hair, wash your hair first as usual with your normal shampoo.  3. After you shampoo, rinse your hair and body thoroughly to remove the shampoo.  4. Use CHG as you would any other liquid soap. You can apply CHG directly to the skin and wash gently with a scrungie or a clean washcloth.   5. Apply the CHG Soap to your body ONLY FROM THE NECK DOWN.  Do not use on open wounds or open sores. Avoid contact with your eyes, ears, mouth and genitals (private parts). Wash Face and genitals (private parts)  with  your normal soap.   6. Wash thoroughly, paying special attention to the area where your surgery will be performed.  7. Thoroughly rinse your body with warm water from the neck down.  8. DO NOT shower/wash with your normal soap after using and rinsing off the CHG Soap.  9. Pat yourself dry with a CLEAN TOWEL.  10. Wear CLEAN PAJAMAS to bed the night before surgery, wear comfortable clothes the morning of surgery  11. Place CLEAN SHEETS on your bed the night of your first shower and DO NOT  SLEEP WITH PETS.  Day of Surgery: Shower with CHG soap as instructed above.  Do not apply any deodorants/lotions.  Please wear clean clothes to the hospital/surgery center.   Remember to brush your teeth WITH YOUR REGULAR TOOTHPASTE.   Please read over the following fact sheets that you were given.

## 2019-09-02 ENCOUNTER — Other Ambulatory Visit: Payer: Self-pay

## 2019-09-02 ENCOUNTER — Encounter (HOSPITAL_COMMUNITY)
Admission: RE | Admit: 2019-09-02 | Discharge: 2019-09-02 | Disposition: A | Discharge status: Home / Self Care | Payer: Medicare Other | Source: Ambulatory Visit | Attending: Surgery | Admitting: Surgery

## 2019-09-02 ENCOUNTER — Encounter (HOSPITAL_COMMUNITY): Payer: Self-pay

## 2019-09-02 DIAGNOSIS — Z01812 Encounter for preprocedural laboratory examination: Secondary | ICD-10-CM | POA: Diagnosis not present

## 2019-09-02 HISTORY — DX: Primary osteoarthritis, unspecified hand: M19.049

## 2019-09-02 HISTORY — DX: Spondylosis, unspecified: M47.9

## 2019-09-02 HISTORY — DX: Hypothyroidism, unspecified: E03.9

## 2019-09-02 HISTORY — DX: Personal history of other diseases of the nervous system and sense organs: Z86.69

## 2019-09-02 HISTORY — DX: Human immunodeficiency virus (HIV) disease: B20

## 2019-09-02 HISTORY — DX: Other complications of anesthesia, initial encounter: T88.59XA

## 2019-09-02 HISTORY — DX: Inflammatory liver disease, unspecified: K75.9

## 2019-09-02 HISTORY — DX: Essential (primary) hypertension: I10

## 2019-09-02 HISTORY — DX: Reserved for inherently not codable concepts without codable children: IMO0001

## 2019-09-02 LAB — COMPREHENSIVE METABOLIC PANEL
ALT: 20 U/L (ref 0–44)
AST: 34 U/L (ref 15–41)
Albumin: 3.5 g/dL (ref 3.5–5.0)
Alkaline Phosphatase: 41 U/L (ref 38–126)
Anion gap: 9 (ref 5–15)
BUN: 17 mg/dL (ref 8–23)
CO2: 23 mmol/L (ref 22–32)
Calcium: 9.4 mg/dL (ref 8.9–10.3)
Chloride: 109 mmol/L (ref 98–111)
Creatinine, Ser: 1.16 mg/dL — ABNORMAL HIGH (ref 0.44–1.00)
GFR calc Af Amer: 53 mL/min — ABNORMAL LOW (ref >60)
GFR calc non Af Amer: 45 mL/min — ABNORMAL LOW (ref >60)
Glucose, Bld: 140 mg/dL — ABNORMAL HIGH (ref 70–99)
Potassium: 4.4 mmol/L (ref 3.5–5.1)
Sodium: 141 mmol/L (ref 135–145)
Total Bilirubin: 0.8 mg/dL (ref 0.3–1.2)
Total Protein: 6.6 g/dL (ref 6.5–8.1)

## 2019-09-02 LAB — CBC WITH DIFFERENTIAL/PLATELET
Abs Immature Granulocytes: 0.01 10*3/uL (ref 0.00–0.07)
Basophils Absolute: 0 10*3/uL (ref 0.0–0.1)
Basophils Relative: 1 %
Eosinophils Absolute: 0.1 10*3/uL (ref 0.0–0.5)
Eosinophils Relative: 4 %
HCT: 39 % (ref 36.0–46.0)
Hemoglobin: 12.7 g/dL (ref 12.0–15.0)
Immature Granulocytes: 0 %
Lymphocytes Relative: 26 %
Lymphs Abs: 0.8 10*3/uL (ref 0.7–4.0)
MCH: 31.7 pg (ref 26.0–34.0)
MCHC: 32.6 g/dL (ref 30.0–36.0)
MCV: 97.3 fL (ref 80.0–100.0)
Monocytes Absolute: 0.2 10*3/uL (ref 0.1–1.0)
Monocytes Relative: 7 %
Neutro Abs: 1.8 10*3/uL (ref 1.7–7.7)
Neutrophils Relative %: 62 %
Platelets: 76 10*3/uL — ABNORMAL LOW (ref 150–400)
RBC: 4.01 MIL/uL (ref 3.87–5.11)
RDW: 12.9 % (ref 11.5–15.5)
WBC: 3 10*3/uL — ABNORMAL LOW (ref 4.0–10.5)
nRBC: 0 % (ref 0.0–0.2)

## 2019-09-02 LAB — HEMOGLOBIN A1C
Hgb A1c MFr Bld: 5.5 % (ref 4.8–5.6)
Mean Plasma Glucose: 111.15 mg/dL

## 2019-09-02 LAB — GLUCOSE, CAPILLARY: Glucose-Capillary: 140 mg/dL — ABNORMAL HIGH (ref 70–99)

## 2019-09-02 NOTE — Progress Notes (Addendum)
PCP - Dr. Antony Contras Cardiologist - denies  PPM/ICD - denies  Chest x-ray - N/A EKG - 09/02/2019 Stress Test - denies ECHO - denies Cardiac Cath - denies  Sleep Study - denies CPAP - N/A  Fasting Blood Sugar - 100 - 140 Checks Blood Sugar 1 time/day  Blood Thinner Instructions: N/A Aspirin Instructions: N/A  ERAS Protcol - Yes PRE-SURGERY Ensure or G2- G2 given  COVID TEST- Scheduled for 09/03/2019  Anesthesia review: YES, abnormal labs (PLT: 76)  Patient denies shortness of breath, fever, cough and chest pain at PAT appointment  All instructions explained to the patient, with a verbal understanding of the material. Patient agrees to go over the instructions while at home for a better understanding. Patient also instructed to self quarantine after being tested for COVID-19. The opportunity to ask questions was provided.

## 2019-09-02 NOTE — Progress Notes (Signed)
Abnormal labs resulted. Dr. Brantley Stage notified via [place].

## 2019-09-03 ENCOUNTER — Other Ambulatory Visit (HOSPITAL_COMMUNITY)
Admission: RE | Admit: 2019-09-03 | Discharge: 2019-09-03 | Disposition: A | Discharge status: Home / Self Care | Payer: Medicare Other | Source: Ambulatory Visit | Attending: Surgery | Admitting: Surgery

## 2019-09-03 DIAGNOSIS — Z20822 Contact with and (suspected) exposure to covid-19: Secondary | ICD-10-CM | POA: Insufficient documentation

## 2019-09-03 DIAGNOSIS — Z01812 Encounter for preprocedural laboratory examination: Secondary | ICD-10-CM | POA: Insufficient documentation

## 2019-09-03 LAB — SARS CORONAVIRUS 2 (TAT 6-24 HRS): SARS Coronavirus 2: NEGATIVE

## 2019-09-05 NOTE — Anesthesia Preprocedure Evaluation (Addendum)
Anesthesia Evaluation  Patient identified by MRN, date of birth, ID band Patient awake    Reviewed: Allergy & Precautions, NPO status , Patient's Chart, lab work & pertinent test results  Airway Mallampati: II  TM Distance: >3 FB Neck ROM: Full    Dental  (+) Dental Advisory Given   Pulmonary neg pulmonary ROS,    breath sounds clear to auscultation       Cardiovascular hypertension, Pt. on medications  Rhythm:Regular Rate:Normal     Neuro/Psych negative neurological ROS     GI/Hepatic GERD  ,(+) Cirrhosis       ,   Endo/Other  diabetes, Type 2, Oral Hypoglycemic AgentsHypothyroidism   Renal/GU negative Renal ROS     Musculoskeletal  (+) Arthritis ,   Abdominal   Peds  Hematology  (+) Blood dyscrasia (thrombocytopenia), ,   Anesthesia Other Findings   Reproductive/Obstetrics                            Anesthesia Physical Anesthesia Plan  ASA: II  Anesthesia Plan: General   Post-op Pain Management:  Regional for Post-op pain   Induction: Intravenous  PONV Risk Score and Plan: 3 and Dexamethasone, Ondansetron and Treatment may vary due to age or medical condition  Airway Management Planned: LMA  Additional Equipment:   Intra-op Plan:   Post-operative Plan: Extubation in OR  Informed Consent: I have reviewed the patients History and Physical, chart, labs and discussed the procedure including the risks, benefits and alternatives for the proposed anesthesia with the patient or authorized representative who has indicated his/her understanding and acceptance.     Dental advisory given  Plan Discussed with: CRNA  Anesthesia Plan Comments: (PAT note by Karoline Caldwell, PA-C: Follows with GI for leukopenia/thrombocytopenia felt due to chronic liver disease. She was diagnosed with cirrhosis in 2012 at the time of her cholecystectomy. Cirrhosis is felt secondary to NASH per GI notes.  She underwent EGD and colonoscopy on 06/13/2019 with no concerning abnormalities noted at that time.  History of recurrent breast cancer s/p lumpectomy and radiation therapy in 2000.  Hx of splenic artery aneurysm. Last imaged 2015, 1.5cm at that time which was stable form 5 years prior.   Preop labs show platelets 76k. Result was sent to Dr. Brantley Stage by PAT RN.  EKG 09/02/19: Sinus rhythm. Rate 64. )       Anesthesia Quick Evaluation

## 2019-09-05 NOTE — Progress Notes (Signed)
Anesthesia Chart Review:  Follows with GI for leukopenia/thrombocytopenia felt due to chronic liver disease. She was diagnosed with cirrhosis in 2012 at the time of her cholecystectomy. Cirrhosis is felt secondary to NASH per GI notes. She underwent EGD and colonoscopy on 06/13/2019 with no concerning abnormalities noted at that time.  History of recurrent breast cancer s/p lumpectomy and radiation therapy in 2000.  Hx of splenic artery aneurysm. Last imaged 2015, 1.5cm at that time which was stable form 5 years prior.   Preop labs show platelets 76k. Result was sent to Dr. Brantley Stage by PAT RN.  EKG 09/02/19: Sinus rhythm. Rate Taft, PA-C [facility] Short Stay Center/Anesthesiology Phone [phone] 09/05/2019 3:02 PM

## 2019-09-07 ENCOUNTER — Ambulatory Visit (HOSPITAL_COMMUNITY)
Admission: RE | Admit: 2019-09-07 | Discharge: 2019-09-07 | Disposition: A | Discharge status: Home / Self Care | Payer: Medicare Other | Source: Ambulatory Visit | Attending: Surgery | Admitting: Surgery

## 2019-09-07 ENCOUNTER — Other Ambulatory Visit: Payer: Self-pay

## 2019-09-07 ENCOUNTER — Ambulatory Visit (HOSPITAL_COMMUNITY): Payer: Medicare Other | Admitting: Critical Care Medicine

## 2019-09-07 ENCOUNTER — Observation Stay (HOSPITAL_COMMUNITY)
Admission: RE | Admit: 2019-09-07 | Discharge: 2019-09-08 | Disposition: A | Discharge status: Home / Self Care | Payer: Medicare Other | Attending: Surgery | Admitting: Surgery

## 2019-09-07 ENCOUNTER — Encounter (HOSPITAL_COMMUNITY)
Admission: RE | Disposition: A | Discharge status: Home / Self Care | Payer: Self-pay | Source: Home / Self Care | Attending: Surgery

## 2019-09-07 ENCOUNTER — Ambulatory Visit (HOSPITAL_COMMUNITY): Payer: Medicare Other | Admitting: Vascular Surgery

## 2019-09-07 ENCOUNTER — Encounter (HOSPITAL_COMMUNITY): Payer: Self-pay | Admitting: Surgery

## 2019-09-07 DIAGNOSIS — Z7984 Long term (current) use of oral hypoglycemic drugs: Secondary | ICD-10-CM | POA: Insufficient documentation

## 2019-09-07 DIAGNOSIS — I10 Essential (primary) hypertension: Secondary | ICD-10-CM | POA: Diagnosis not present

## 2019-09-07 DIAGNOSIS — M1990 Unspecified osteoarthritis, unspecified site: Secondary | ICD-10-CM | POA: Diagnosis not present

## 2019-09-07 DIAGNOSIS — K7460 Unspecified cirrhosis of liver: Secondary | ICD-10-CM | POA: Diagnosis not present

## 2019-09-07 DIAGNOSIS — Z79890 Hormone replacement therapy (postmenopausal): Secondary | ICD-10-CM | POA: Diagnosis not present

## 2019-09-07 DIAGNOSIS — Z79899 Other long term (current) drug therapy: Secondary | ICD-10-CM | POA: Insufficient documentation

## 2019-09-07 DIAGNOSIS — E119 Type 2 diabetes mellitus without complications: Secondary | ICD-10-CM | POA: Insufficient documentation

## 2019-09-07 DIAGNOSIS — K219 Gastro-esophageal reflux disease without esophagitis: Secondary | ICD-10-CM | POA: Insufficient documentation

## 2019-09-07 DIAGNOSIS — E039 Hypothyroidism, unspecified: Secondary | ICD-10-CM | POA: Insufficient documentation

## 2019-09-07 DIAGNOSIS — C50911 Malignant neoplasm of unspecified site of right female breast: Secondary | ICD-10-CM

## 2019-09-07 DIAGNOSIS — C50411 Malignant neoplasm of upper-outer quadrant of right female breast: Principal | ICD-10-CM | POA: Diagnosis present

## 2019-09-07 HISTORY — PX: MASTECTOMY W/ SENTINEL NODE BIOPSY: SHX2001

## 2019-09-07 LAB — GLUCOSE, CAPILLARY
Glucose-Capillary: 122 mg/dL — ABNORMAL HIGH (ref 70–99)
Glucose-Capillary: 176 mg/dL — ABNORMAL HIGH (ref 70–99)
Glucose-Capillary: 253 mg/dL — ABNORMAL HIGH (ref 70–99)
Glucose-Capillary: 122 mg/dL — ABNORMAL HIGH (ref 70–99)

## 2019-09-07 SURGERY — MASTECTOMY WITH SENTINEL LYMPH NODE BIOPSY
Anesthesia: General | Site: Breast | Laterality: Right

## 2019-09-07 MED ORDER — MIDAZOLAM HCL 2 MG/2ML IJ SOLN
INTRAMUSCULAR | Status: AC
  Filled 2019-09-07: qty 2

## 2019-09-07 MED ORDER — PROPOFOL 10 MG/ML IV BOLUS
INTRAVENOUS | Status: AC
  Filled 2019-09-07: qty 20

## 2019-09-07 MED ORDER — FENTANYL CITRATE (PF) 100 MCG/2ML IJ SOLN: 50.0000 ug | Freq: Once | INTRAMUSCULAR | Status: AC

## 2019-09-07 MED ORDER — METFORMIN HCL 500 MG PO TABS
500.0000 mg | ORAL_TABLET | Freq: Every day | ORAL | Status: DC
  Administered 2019-09-08: 500 mg via ORAL
  Filled 2019-09-07: qty 1

## 2019-09-07 MED ORDER — ALBUTEROL SULFATE HFA 108 (90 BASE) MCG/ACT IN AERS
INHALATION_SPRAY | RESPIRATORY_TRACT | Status: AC
  Filled 2019-09-07: qty 6.7

## 2019-09-07 MED ORDER — INSULIN ASPART 100 UNIT/ML ~~LOC~~ SOLN
0.0000 [IU] | Freq: Three times a day (TID) | SUBCUTANEOUS | Status: DC
  Administered 2019-09-07: 5 [IU] via SUBCUTANEOUS

## 2019-09-07 MED ORDER — PHENYLEPHRINE HCL (PRESSORS) 10 MG/ML IV SOLN
INTRAVENOUS | Status: AC
  Filled 2019-09-07: qty 1

## 2019-09-07 MED ORDER — HYDROMORPHONE HCL 1 MG/ML IJ SOLN
1.0000 mg | INTRAMUSCULAR | Status: DC | PRN
  Filled 2019-09-07: qty 1

## 2019-09-07 MED ORDER — CLONIDINE HCL (ANALGESIA) 100 MCG/ML EP SOLN
EPIDURAL | Status: DC | PRN
  Administered 2019-09-07: 50 ug

## 2019-09-07 MED ORDER — FENTANYL CITRATE (PF) 100 MCG/2ML IJ SOLN: 25.0000 ug | INTRAMUSCULAR | Status: DC | PRN

## 2019-09-07 MED ORDER — FENTANYL CITRATE (PF) 100 MCG/2ML IJ SOLN
INTRAMUSCULAR | Status: AC
  Administered 2019-09-07: 25 ug via INTRAVENOUS
  Filled 2019-09-07: qty 2

## 2019-09-07 MED ORDER — INDOCYANINE GREEN 25 MG IV SOLR
INTRAVENOUS | Status: DC | PRN
Start: 2019-09-07 — End: 2019-09-07
  Administered 2019-09-07: 7.5 mg via INTRAVENOUS

## 2019-09-07 MED ORDER — PHENYLEPHRINE 40 MCG/ML (10ML) SYRINGE FOR IV PUSH (FOR BLOOD PRESSURE SUPPORT)
PREFILLED_SYRINGE | INTRAVENOUS | Status: AC
  Filled 2019-09-07: qty 20

## 2019-09-07 MED ORDER — GABAPENTIN 300 MG PO CAPS
300.0000 mg | ORAL_CAPSULE | Freq: Two times a day (BID) | ORAL | Status: DC
  Administered 2019-09-07: 300 mg via ORAL
  Filled 2019-09-07 (×2): qty 1

## 2019-09-07 MED ORDER — ENOXAPARIN SODIUM 40 MG/0.4ML ~~LOC~~ SOLN
40.0000 mg | SUBCUTANEOUS | Status: DC
  Administered 2019-09-08: 40 mg via SUBCUTANEOUS
  Filled 2019-09-07: qty 0.4

## 2019-09-07 MED ORDER — CEFAZOLIN SODIUM-DEXTROSE 2-4 GM/100ML-% IV SOLN
2.0000 g | INTRAVENOUS | Status: AC
  Administered 2019-09-07: 2 g via INTRAVENOUS
  Filled 2019-09-07: qty 100

## 2019-09-07 MED ORDER — EPHEDRINE SULFATE 50 MG/ML IJ SOLN
INTRAMUSCULAR | Status: DC | PRN
Start: 2019-09-07 — End: 2019-09-07
  Administered 2019-09-07: 5 mg via INTRAVENOUS

## 2019-09-07 MED ORDER — CEFAZOLIN SODIUM-DEXTROSE 2-4 GM/100ML-% IV SOLN
2.0000 g | Freq: Three times a day (TID) | INTRAVENOUS | Status: AC
  Administered 2019-09-07: 2 g via INTRAVENOUS
  Filled 2019-09-07: qty 100

## 2019-09-07 MED ORDER — METHYLENE BLUE 0.5 % INJ SOLN
INTRAVENOUS | Status: AC
  Filled 2019-09-07: qty 10

## 2019-09-07 MED ORDER — FENTANYL CITRATE (PF) 250 MCG/5ML IJ SOLN
INTRAMUSCULAR | Status: AC
  Filled 2019-09-07: qty 5

## 2019-09-07 MED ORDER — HYDROCODONE-ACETAMINOPHEN 5-325 MG PO TABS
ORAL_TABLET | ORAL | Status: AC
  Filled 2019-09-07: qty 2

## 2019-09-07 MED ORDER — DEXAMETHASONE SODIUM PHOSPHATE 10 MG/ML IJ SOLN
INTRAMUSCULAR | Status: DC | PRN
  Administered 2019-09-07: 5 mg via INTRAVENOUS

## 2019-09-07 MED ORDER — SODIUM CHLORIDE (PF) 0.9 % IJ SOLN
INTRAMUSCULAR | Status: AC
  Filled 2019-09-07: qty 10

## 2019-09-07 MED ORDER — AMISULPRIDE (ANTIEMETIC) 5 MG/2ML IV SOLN: 10.0000 mg | Freq: Once | INTRAVENOUS | Status: DC | PRN

## 2019-09-07 MED ORDER — LOSARTAN POTASSIUM 25 MG PO TABS
12.5000 mg | ORAL_TABLET | Freq: Every day | ORAL | Status: DC
  Administered 2019-09-07 – 2019-09-08 (×2): 12.5 mg via ORAL
  Filled 2019-09-07 (×3): qty 1

## 2019-09-07 MED ORDER — HYDROCODONE-ACETAMINOPHEN 5-325 MG PO TABS
1.0000 | ORAL_TABLET | ORAL | Status: DC | PRN
  Administered 2019-09-07: 2 via ORAL

## 2019-09-07 MED ORDER — LEVOTHYROXINE SODIUM 25 MCG PO TABS
137.0000 ug | ORAL_TABLET | Freq: Every day | ORAL | Status: DC
  Administered 2019-09-08: 137 ug via ORAL
  Filled 2019-09-07: qty 1

## 2019-09-07 MED ORDER — ONDANSETRON HCL 4 MG/2ML IJ SOLN
INTRAMUSCULAR | Status: AC
  Filled 2019-09-07: qty 2

## 2019-09-07 MED ORDER — CALCIUM CARBONATE ANTACID 500 MG PO CHEW
1.0000 | CHEWABLE_TABLET | Freq: Four times a day (QID) | ORAL | Status: DC | PRN
  Administered 2019-09-07: 200 mg via ORAL
  Filled 2019-09-07: qty 1

## 2019-09-07 MED ORDER — TECHNETIUM TC 99M SULFUR COLLOID FILTERED
1.0000 | Freq: Once | INTRAVENOUS | Status: AC | PRN
  Administered 2019-09-07: 1 via INTRADERMAL

## 2019-09-07 MED ORDER — ONDANSETRON HCL 4 MG/2ML IJ SOLN
INTRAMUSCULAR | Status: DC | PRN
  Administered 2019-09-07: 4 mg via INTRAVENOUS

## 2019-09-07 MED ORDER — 0.9 % SODIUM CHLORIDE (POUR BTL) OPTIME
TOPICAL | Status: DC | PRN
  Administered 2019-09-07: 1000 mL

## 2019-09-07 MED ORDER — PIOGLITAZONE HCL 15 MG PO TABS
15.0000 mg | ORAL_TABLET | Freq: Every day | ORAL | Status: DC
  Administered 2019-09-08: 15 mg via ORAL
  Filled 2019-09-07: qty 1

## 2019-09-07 MED ORDER — ACETAMINOPHEN 500 MG PO TABS
1000.0000 mg | ORAL_TABLET | ORAL | Status: AC
  Administered 2019-09-07: 1000 mg via ORAL
  Filled 2019-09-07: qty 2

## 2019-09-07 MED ORDER — DIPHENHYDRAMINE HCL 12.5 MG/5ML PO ELIX: 12.5000 mg | ORAL_SOLUTION | Freq: Four times a day (QID) | ORAL | Status: DC | PRN

## 2019-09-07 MED ORDER — LACTATED RINGERS IV SOLN: INTRAVENOUS | Status: DC

## 2019-09-07 MED ORDER — MIDAZOLAM HCL 2 MG/2ML IJ SOLN: 1.0000 mg | Freq: Once | INTRAMUSCULAR | Status: AC

## 2019-09-07 MED ORDER — CHLORHEXIDINE GLUCONATE 0.12 % MT SOLN
15.0000 mL | Freq: Once | OROMUCOSAL | Status: AC
  Administered 2019-09-07: 15 mL via OROMUCOSAL
  Filled 2019-09-07: qty 15

## 2019-09-07 MED ORDER — LIDOCAINE 2% (20 MG/ML) 5 ML SYRINGE
INTRAMUSCULAR | Status: AC
  Filled 2019-09-07: qty 5

## 2019-09-07 MED ORDER — ROCURONIUM BROMIDE 10 MG/ML (PF) SYRINGE
PREFILLED_SYRINGE | INTRAVENOUS | Status: AC
  Filled 2019-09-07: qty 10

## 2019-09-07 MED ORDER — ORAL CARE MOUTH RINSE: 15.0000 mL | Freq: Once | OROMUCOSAL | Status: AC

## 2019-09-07 MED ORDER — CHLORHEXIDINE GLUCONATE CLOTH 2 % EX PADS: 6.0000 | MEDICATED_PAD | Freq: Once | CUTANEOUS | Status: DC

## 2019-09-07 MED ORDER — EPHEDRINE 5 MG/ML INJ
INTRAVENOUS | Status: AC
  Filled 2019-09-07: qty 10

## 2019-09-07 MED ORDER — HEMOSTATIC AGENTS (NO CHARGE) OPTIME
TOPICAL | Status: DC | PRN
  Administered 2019-09-07 (×3): 1 via TOPICAL

## 2019-09-07 MED ORDER — DIPHENHYDRAMINE HCL 50 MG/ML IJ SOLN: 12.5000 mg | Freq: Four times a day (QID) | INTRAMUSCULAR | Status: DC | PRN

## 2019-09-07 MED ORDER — PANTOPRAZOLE SODIUM 40 MG PO TBEC
40.0000 mg | DELAYED_RELEASE_TABLET | Freq: Every day | ORAL | Status: DC
  Administered 2019-09-08: 40 mg via ORAL
  Filled 2019-09-07 (×2): qty 1

## 2019-09-07 MED ORDER — DEXAMETHASONE SODIUM PHOSPHATE 10 MG/ML IJ SOLN
INTRAMUSCULAR | Status: AC
  Filled 2019-09-07: qty 1

## 2019-09-07 MED ORDER — MIDAZOLAM HCL 2 MG/2ML IJ SOLN
INTRAMUSCULAR | Status: AC
  Administered 2019-09-07: 1 mg via INTRAVENOUS
  Filled 2019-09-07: qty 2

## 2019-09-07 MED ORDER — PROPOFOL 10 MG/ML IV BOLUS
INTRAVENOUS | Status: DC | PRN
  Administered 2019-09-07: 200 mg via INTRAVENOUS
  Administered 2019-09-07 (×2): 30 mg via INTRAVENOUS

## 2019-09-07 MED ORDER — FENTANYL CITRATE (PF) 100 MCG/2ML IJ SOLN
INTRAMUSCULAR | Status: AC
  Administered 2019-09-07: 50 ug via INTRAVENOUS
  Filled 2019-09-07: qty 2

## 2019-09-07 MED ORDER — SODIUM CHLORIDE (PF) 0.9 % IJ SOLN
INTRAVENOUS | Status: DC | PRN
  Administered 2019-09-07: 5 mL

## 2019-09-07 MED ORDER — BUPIVACAINE-EPINEPHRINE (PF) 0.5% -1:200000 IJ SOLN
INTRAMUSCULAR | Status: DC | PRN
  Administered 2019-09-07: 30 mL

## 2019-09-07 MED ORDER — FENTANYL CITRATE (PF) 100 MCG/2ML IJ SOLN
INTRAMUSCULAR | Status: DC | PRN
  Administered 2019-09-07: 25 ug via INTRAVENOUS
  Administered 2019-09-07: 50 ug via INTRAVENOUS
  Administered 2019-09-07 (×2): 25 ug via INTRAVENOUS
  Administered 2019-09-07: 50 ug via INTRAVENOUS
  Administered 2019-09-07: 25 ug via INTRAVENOUS

## 2019-09-07 MED ORDER — ACETAMINOPHEN 325 MG PO TABS: 650.0000 mg | ORAL_TABLET | Freq: Four times a day (QID) | ORAL | Status: DC | PRN

## 2019-09-07 MED ORDER — SIMVASTATIN 20 MG PO TABS
20.0000 mg | ORAL_TABLET | Freq: Every evening | ORAL | Status: DC
  Administered 2019-09-07: 20 mg via ORAL
  Filled 2019-09-07: qty 1

## 2019-09-07 MED ORDER — HYDRALAZINE HCL 10 MG PO TABS: 10.0000 mg | ORAL_TABLET | Freq: Four times a day (QID) | ORAL | Status: DC | PRN

## 2019-09-07 MED ORDER — ACETAMINOPHEN 650 MG RE SUPP: 650.0000 mg | Freq: Four times a day (QID) | RECTAL | Status: DC | PRN

## 2019-09-07 MED ORDER — PHENYLEPHRINE HCL (PRESSORS) 10 MG/ML IV SOLN
INTRAVENOUS | Status: DC | PRN
  Administered 2019-09-07 (×4): 80 ug via INTRAVENOUS

## 2019-09-07 SURGICAL SUPPLY — 56 items
ADH SKN CLS APL DERMABOND .7 (GAUZE/BANDAGES/DRESSINGS) ×2
AGENT HMST 10 BLLW SHRT CANN (HEMOSTASIS) ×3
APL PRP STRL LF DISP 70% ISPRP (MISCELLANEOUS) ×1
APPLIER CLIP 9.375 MED OPEN (MISCELLANEOUS) ×4
APR CLP MED 9.3 20 MLT OPN (MISCELLANEOUS) ×2
BINDER BREAST XLRG (GAUZE/BANDAGES/DRESSINGS) ×1 IMPLANT
BIOPATCH RED 1 DISK 7.0 (GAUZE/BANDAGES/DRESSINGS) ×2 IMPLANT
CANISTER SUCT 3000ML PPV (MISCELLANEOUS) ×2 IMPLANT
CHLORAPREP W/TINT 26 (MISCELLANEOUS) ×2 IMPLANT
CLIP APPLIE 9.375 MED OPEN (MISCELLANEOUS) ×1 IMPLANT
CNTNR URN SCR LID CUP LEK RST (MISCELLANEOUS) ×1 IMPLANT
CONT SPEC 4OZ STRL OR WHT (MISCELLANEOUS) ×2
COVER PROBE W GEL 5X96 (DRAPES) ×2 IMPLANT
COVER SURGICAL LIGHT HANDLE (MISCELLANEOUS) ×2 IMPLANT
COVER WAND RF STERILE (DRAPES) ×2 IMPLANT
DERMABOND ADVANCED (GAUZE/BANDAGES/DRESSINGS) ×2
DERMABOND ADVANCED .7 DNX12 (GAUZE/BANDAGES/DRESSINGS) ×1 IMPLANT
DRAIN CHANNEL 19F RND (DRAIN) ×3 IMPLANT
DRAPE LAPAROSCOPIC ABDOMINAL (DRAPES) ×2 IMPLANT
DRSG PAD ABDOMINAL 8X10 ST (GAUZE/BANDAGES/DRESSINGS) ×1 IMPLANT
DRSG TEGADERM 4X4.75 (GAUZE/BANDAGES/DRESSINGS) ×2 IMPLANT
ELECT REM PT RETURN 9FT ADLT (ELECTROSURGICAL) ×2
ELECTRODE REM PT RTRN 9FT ADLT (ELECTROSURGICAL) ×1 IMPLANT
EVACUATOR SILICONE 100CC (DRAIN) ×3 IMPLANT
GLOVE BIO SURGEON STRL SZ8 (GLOVE) ×2 IMPLANT
GLOVE BIOGEL PI IND STRL 8 (GLOVE) ×1 IMPLANT
GLOVE BIOGEL PI INDICATOR 8 (GLOVE) ×1
GOWN STRL REUS W/ TWL LRG LVL3 (GOWN DISPOSABLE) ×3 IMPLANT
GOWN STRL REUS W/ TWL XL LVL3 (GOWN DISPOSABLE) ×1 IMPLANT
GOWN STRL REUS W/TWL LRG LVL3 (GOWN DISPOSABLE) ×6
GOWN STRL REUS W/TWL XL LVL3 (GOWN DISPOSABLE) ×2
HEMOSTAT HEMOBLAST BELLOWS (HEMOSTASIS) ×3 IMPLANT
KIT BASIN OR (CUSTOM PROCEDURE TRAY) ×2 IMPLANT
KIT TURNOVER KIT B (KITS) ×2 IMPLANT
NDL 18GX1X1/2 (RX/OR ONLY) (NEEDLE) ×1 IMPLANT
NDL FILTER BLUNT 18X1 1/2 (NEEDLE) IMPLANT
NDL HYPO 25GX1X1/2 BEV (NEEDLE) ×1 IMPLANT
NEEDLE 18GX1X1/2 (RX/OR ONLY) (NEEDLE) ×2 IMPLANT
NEEDLE FILTER BLUNT 18X 1/2SAF (NEEDLE)
NEEDLE FILTER BLUNT 18X1 1/2 (NEEDLE) IMPLANT
NEEDLE HYPO 25GX1X1/2 BEV (NEEDLE) ×2 IMPLANT
NS IRRIG 1000ML POUR BTL (IV SOLUTION) ×2 IMPLANT
PACK GENERAL/GYN (CUSTOM PROCEDURE TRAY) ×2 IMPLANT
PACK SPY-PHI (KITS) ×1 IMPLANT
PAD ARMBOARD 7.5X6 YLW CONV (MISCELLANEOUS) ×2 IMPLANT
PENCIL SMOKE EVACUATOR (MISCELLANEOUS) ×2 IMPLANT
PIN SAFETY STERILE (MISCELLANEOUS) ×1 IMPLANT
SPECIMEN JAR X LARGE (MISCELLANEOUS) ×2 IMPLANT
SPONGE LAP 18X18 RF (DISPOSABLE) ×2 IMPLANT
SUT ETHILON 3 0 FSL (SUTURE) ×3 IMPLANT
SUT MNCRL AB 4-0 PS2 18 (SUTURE) ×2 IMPLANT
SUT VIC AB 3-0 SH 18 (SUTURE) ×3 IMPLANT
SYR BULB IRRIG 60ML STRL (SYRINGE) ×1 IMPLANT
SYR CONTROL 10ML LL (SYRINGE) ×2 IMPLANT
TOWEL GREEN STERILE (TOWEL DISPOSABLE) ×2 IMPLANT
TOWEL GREEN STERILE FF (TOWEL DISPOSABLE) ×2 IMPLANT

## 2019-09-07 NOTE — Anesthesia Postprocedure Evaluation (Signed)
Anesthesia Post Note  Patient: Catherine Decker  Procedure(s) Performed: RIGHT SIMPLE MASTECTOMY WITH SENTINEL LYMPH NODE MAPPING (Right Breast)     Patient location during evaluation: PACU Anesthesia Type: General Level of consciousness: awake and alert Pain management: pain level controlled Vital Signs Assessment: post-procedure vital signs reviewed and stable Respiratory status: spontaneous breathing, nonlabored ventilation, respiratory function stable and patient connected to nasal cannula oxygen Cardiovascular status: blood pressure returned to baseline and stable Postop Assessment: no apparent nausea or vomiting Anesthetic complications: no   No complications documented.  Last Vitals:  Vitals:   09/07/19 1115 09/07/19 1130  BP: (!) 141/66 (!) 138/59  Pulse: 82 80  Resp: 16 12  Temp:  (!) 36.1 C  SpO2: 94% 94%    Last Pain:  Vitals:   09/07/19 1130  TempSrc:   PainSc: [name]

## 2019-09-07 NOTE — Progress Notes (Signed)
Patient arrived to 6N27, aox4, VSS, c/o pain 3/10, with 2 JP drains on right lateral and medial chest area. Assessed incision, skin glue, clean dry and intact with abd pad over incision and breast binder on. Oriented to room, use of call bell and phone placed within patient's reach. Informed about visitation policy. Will continue to monitor patient.

## 2019-09-07 NOTE — Anesthesia Procedure Notes (Signed)
Procedure Name: LMA Insertion Date/Time: 09/07/2019 8:28 AM Performed by: Wilburn Cornelia, CRNA Pre-anesthesia Checklist: Patient identified, Emergency Drugs available, Suction available and Patient being monitored Patient Re-evaluated:Patient Re-evaluated prior to induction Oxygen Delivery Method: Circle System Utilized Preoxygenation: Pre-oxygenation with 100% oxygen Induction Type: IV induction Ventilation: Mask ventilation without difficulty LMA: LMA inserted LMA Size: 4.0 Number of attempts: 1 Placement Confirmation: positive ETCO2 and breath sounds checked- equal and bilateral Tube secured with: Tape Dental Injury: Teeth and Oropharynx as per pre-operative assessment

## 2019-09-07 NOTE — Op Note (Signed)
Preoperative diagnosis: Right breast cancer upper outer quadrant stage I multifocal  Postoperative diagnosis: Same  Procedure: Right simple mastectomy with completion right axillary lymph node dissection injection of methylene blue dye   Surgeon: Erroll Luna, MD  Anesthesia: LMA with pectoral block  EBL: 30 cc  Drains: 2 #19 round drains placed  IV fluids: Per anesthesia record  Indication for procedure: The patient is a 79 year old female with history of multifocal right breast cancer.  She has a history of right breast cancer in 2002 treated with breast conserving surgery and radiation therapy.  MRI showed multifocal disease and the patient opted for right simple mastectomy over lumpectomy.The surgical and non surgical options have been discussed with the patient.  Risks of surgery include bleeding,  Infection,  Flap necrosis,  Tissue loss,  Chronic pain, death, Numbness,  And the need for additional procedures.  Reconstruction options also have been discussed with the patient as well.  The patient agrees to proceed.Lymph node  dissection has been discussed with the patient.  Risk of bleeding, lymphedema , Infection,  Seroma formation,  Additional procedures,,  Shoulder weakness ,  Shoulder stiffness,  Nerve and blood vessel injury and reaction to the mapping dyes have been discussed.  Alternatives to surgery have been discussed with the patient.  The patient agrees to proceed.   Description of procedure: The patient was met in the holding area.  Pectoral block placed in the right side and reviewed this procedure with the patient.  Right pectoral block placed per anesthesia and technetium sulfur colloid injected by nuclear medicine.  Right side was marked as correct after reviewing chart and discussing the procedure with the patient.  She was then taken back to the operative room.  She is placed supine upon the OR table after induction of general anesthesia, right breast was prepped and  draped sterile fashion timeout performed.  Proper patient procedure were verified and site verified with the OR staff.  She received appropriate of antibiotics.  4 cc of methylene blue dye were injected.       Neoprobe used to identify right axillary hotspot.  This was quite weak though at first.  Curvilinear incision made above and below the nipple areolar complex.  Her skin was quite threatened thin and friable.  She has a history of thrombocytopenia secondary to liver disease and had some oozing that was mild.  The superior skin flap was taken up to the clavicle.  The inferior skin flap was taken to the inframammary fold.  The breast was then dissected off the pectoralis major muscle to include the fascia in the medial lateral fashion until the axillary contents were encountered.  The breast was then divided from lateral attachments and passed off the field.  Neoprobe was used again there is very little activity or blue dye noted in the axilla or breast specimen.  I opted to do a completion right axilla lymph node dissection.  All lymphovascular tissue between the axillary vein, long thoracic nerve and thoracodorsal trunk were dissected out.  Small vessels were controlled with clips.  The structures were preserved.  Extra contents were passed off separately.  There is some thickening along the inferior skin flap laterally I took this off the skin with Metzenbaum scissors.  This corresponded to a previous lumpectomy site and scar.  Her superior skin flap was quite thin.  I spy technology was used.  This took the perfusion of the skin flaps.  The area in question was quite thin  and was devascularized.  I excised the part of the skin flap and superiorly to include this.  The rest of the skin was well perfused.  I placed 219 round drains in the inferior flap.  Hemoblast was applied to the pectoralis muscle and undersurface of skin flaps given her history of thrombocytopenia.  This was held in place for 3 minutes  with a moist lap and then removed.  There was excellent hemostasis.  We then closed the skin flaps with 3-0 Vicryl and 4-0 Monocryl.  Dermabond applied and breast binder placed.  Drains were placed to JP suction.  All counts were found to be correct.  Patient was awoke extubated taken to recovery in satisfactory condition.

## 2019-09-07 NOTE — Anesthesia Procedure Notes (Signed)
Anesthesia Regional Block: Pectoralis block   Pre-Anesthetic Checklist: ,, timeout performed, Correct Patient, Correct Site, Correct Laterality, Correct Procedure, Correct Position, site marked, Risks and benefits discussed,  Surgical consent,  Pre-op evaluation,  At surgeon's request and post-op pain management  Laterality: Right  Prep: chloraprep       Needles:  Injection technique: Single-shot  Needle Type: Echogenic Needle     Needle Length: 9cm  Needle Gauge: 21     Additional Needles:   Procedures:,,,, ultrasound used (permanent image in chart),,,,  Narrative:  Start time: 09/07/2019 7:50 AM End time: 09/07/2019 7:56 AM Injection made incrementally with aspirations every 5 mL.  Performed by: Personally  Anesthesiologist: Suzette Battiest, MD

## 2019-09-07 NOTE — H&P (Addendum)
Signed       Show:Clear all _0 Manual_1 Template_2 Copied  Added by: _3 Erroll Luna, MD  _4 Hover for details ERA PARR Appointment: 07/01/2019 9:40 AM Location: [facility] Patient #: [id number] DOB: 1940-04-25 [name] / Race: White Female  History of Present Illness [name] A. Kerly Rigsbee MD; 07/01/2019 1:25 PM) Patient words: Patient presents for evaluation of a right breast mass. She has a past history of right breast cancer treated in 2000 in [place]. She is treated with lumpectomy and radiation therapy. She took 6 months of tamoxifen and stopped. No records available for review. She had a mass in her right breast in the upper central port of the breast. Core biopsy showed invasive mammary carcinoma consistent with lobular estrogen receptor positive progesterone receptor positive HER-2/neu negative.   SHE HAD ADDITIOANL BIOPSIES ON THE LEFT AND MRI  CONCERNING FOR MULTIFOCAL DISEASE ON THE RIGHT.  SHE HAS CHANGED HER MIND AND WANTS MASTECTOMY         Mass felt by the patient in the 1 o'clock position of the right breast for the past 2 months. Status post right lumpectomy breast cancer in 2000.  EXAM: DIGITAL DIAGNOSTIC BILATERAL MAMMOGRAM WITH CAD AND TOMO  ULTRASOUND RIGHT BREAST  COMPARISON: Previous exam(s).  ACR Breast Density Category c: The breast tissue is heterogeneously dense, which may obscure small masses.  FINDINGS: Interval small spiculated mass with some coarse calcification in the 12 o'clock position of the right breast. This is in the region of the mass felt by the patient, marked with a metallic marker. Stable post lumpectomy changes the posterior aspect of the upper-outer right breast. No interval findings on the left suspicious for malignancy.  Mammographic images were processed with CAD.  On physical exam, patient has an approximately 1.5 cm palpable mass in the 12 o'clock position of  the right breast, 3 cm from the nipple. There are no palpable right axillary lymph nodes.  Targeted ultrasound is performed, showing a 1.4 x 0.9 x 0.5 cm irregular, hypoechoic mass in the 12 o'clock position of the right breast, 3 cm from the nipple. This has ill-defined surrounding increased echogenicity and corresponds to the palpable mass. A coarse calcification is demonstrated at the anterior aspect of the mass.  Ultrasound of the right axilla demonstrated no abnormal right axillary lymph nodes.  IMPRESSION: 1. 1.4 cm mass in the 12 o'clock position of the right breast with imaging features highly suspicious for malignancy. 2. No evidence of malignancy elsewhere in either breast.  RECOMMENDATION: Ultrasound-guided core needle biopsy of the 1.4 cm mass in the 12 o'clock position of the right breast. This has been discussed with the patient and scheduled at 12:45 p.m. on 06/27/2019.  I have discussed the findings and recommendations with the patient. If applicable, a reminder letter will be sent to the patient regarding the next appointment.  BI-RADS CATEGORY 5: Highly suggestive of malignancy.   Electronically Signed By: Claudie Revering M.D. On: 06/16/2019 12:34            REASON FOR ADDENDUM, AMENDMENT OR CORRECTION: [id number]: REASON FOR ADDENDUM: E-cadherin results. 06/28/19 11:44:00 AM (AC:ah) ADDITIONAL INFORMATION: FLUORESCENCE IN-SITU HYBRIDIZATION Results: GROUP 5: HER2 **NEGATIVE** Equivocal form of amplification of the HER2 gene was detected in the IHC 2+ tissue sample received from this individual. HER2 FISH was performed by a technologist and cell imaging and analysis on the [employer]. RATIO OF HER2/CEN17 SIGNALS 1.32 AVERAGE HER2 COPY NUMBER PER CELL 2.45 The ratio of HER2/CEN 17 is within  the range < 2.0 of HER2/CEN 17 and a copy number of HER2 signals per cell is <4.0. Arch Pathol Lab Med 1:1,2018 Vicente Males  MD Pathologist, Electronic Signature ( Signed 06/29/2019) PROGNOSTIC INDICATORS Results: IMMUNOHISTOCHEMICAL AND MORPHOMETRIC ANALYSIS PERFORMED MANUALLY The tumor cells are EQUIVOCAL for Her2 (2+). Her2 by FISH will be performed and results reported separately. Estrogen Receptor: 90%, POSITIVE, STRONG STAINING INTENSITY Progesterone Receptor: 20%, POSITIVE, MODERATE STAINING INTENSITY Proliferation Marker Ki67: 20% REFERENCE RANGE ESTROGEN RECEPTOR 1 of 3 Amended copy Addendum FINAL for Schweers, Greyson C ([id number]) ADDITIONAL INFORMATION:(continued) NEGATIVE 0% POSITIVE =>1% REFERENCE RANGE PROGESTERONE RECEPTOR NEGATIVE 0% POSITIVE =>1% All controls stained appropriately Vicente Males MD Pathologist, Electronic Signature ( Signed 06/28/2019) FINAL DIAGNOSIS Diagnosis Breast, right, needle core biopsy, upper, 12 o'clock, 3cmfn - INVASIVE MAMMARY CARCINOMA. Microscopic Comment The carcinoma appears grade 2. The greatest linear extent of tumor in any one core is 14 mm. E-cadherin will be reported separately. Ancillary studies will be reported separately. The results were reported to [facility] on 06/27/2019. Intradepartmental consultation (Dr. Lyndon Code). Addendum: Immunohistochemistry for E-cadherin demonstrates weak expression. Lobular origin is favored. Intradepartmental consultation (Dr. Melina Copa). Gillie Manners MD Pathologist, Electronic Signature (Case signed 06/27/2019) Corrected Report Signer Gillie Manners MD Pathologist, Electronic Signature (Case signed 06/28/2019) Specimen Gross and Clinical Information Specimen Comment TIF: 3:10 PM; extracted < 105 minute; 79 year old female with 1.4cm mass.  The patient is a 79 year old female.   Allergies (Tanisha A. Owens Shark, [place]; 07/01/2019 9:47 AM) No Known Drug Allergies [07/01/2019]: Allergies Reconciled  Medication History (Tanisha A. Owens Shark, [place]; 5/46/2703 5:00 AM) Folic Acid (1MG Tablet,  Oral) Active. Losartan Potassium (25MG Tablet, Oral) Active. Levothyroxine Sodium (137MCG Tablet, Oral) Active. Simvastatin (20MG Tablet, Oral) Active. Fish Oil (Oral) Specific strength unknown - Active. Omeprazole (40MG Capsule DR, Oral) Active. metFORMIN HCl (500MG Tablet, Oral) Active. Medications Reconciled    Vitals (Tanisha A. Brown RMA; 07/01/2019 9:48 AM) 07/01/2019 9:48 AM Weight: 175.6 lb Height: 63in Body Surface Area: 1.83 m Body Mass Index: 31.11 kg/m  Temp.: 97.73F  Pulse: 80 (Regular)  BP: 132/78(Sitting, Left Arm, Standard)        Physical Exam (Kameelah Minish A. Joevanni Roddey MD; 07/01/2019 1:25 PM)  General Mental Status-Alert. General Appearance-Consistent with stated age. Hydration-Well hydrated. Voice-Normal.  Head and Neck Head-normocephalic, atraumatic with no lesions or palpable masses. Trachea-midline. Thyroid Gland Characteristics - normal size and consistency.  Breast Note: Bruising right central breast. Signs of lumpectomy with radiation into the right. Left breast normal.  Lymphatic Head & Neck  General Head & Neck Lymphatics: Bilateral - Description - Normal. Axillary  General Axillary Region: Bilateral - Description - Normal. Tenderness - Non Tender.    Assessment & Plan (Marlis Oldaker A. Ellsworth Waldschmidt MD; 07/01/2019 1:26 PM)  BREAST CANCER, RIGHT (C50.911) Impression:right breast cancer pt opted for right simple mastectomy at this point after changing her mind on breast conserving surgery after MRI  The surgical and non surgical options have been discussed with the patient.  Risks of surgery include bleeding,  Infection,  Flap necrosis,  Tissue loss,  Chronic pain, death, Numbness,  And the need for additional procedures.  Reconstruction options also have been discussed with the patient as well.  The patient agrees to proceed. Current Plans You are being scheduled for surgery- Our schedulers will call  you.  You should hear from our office's scheduling department within 5 working days about the location, date, and time of surgery. We try to make accommodations for  patient's preferences in scheduling surgery, but sometimes the OR schedule or the surgeon's schedule prevents [place] from making those accommodations.  If you have not heard from our office [phone]) in 5 working days, call the office and ask for your surgeon's nurse.  If you have other questions about your diagnosis, plan, or surgery, call the office and ask for your surgeon's nurse.  Pt Education - [facility] Breast Cancer Information Given - [facility] "Breast Journey" Package We discussed the staging and pathophysiology of breast cancer. We discussed all of the different options for treatment for breast cancer including surgery, chemotherapy, radiation therapy, Herceptin, and antiestrogen therapy. We discussed a sentinel lymph node biopsy as she does not appear to having lymph node involvement right now. We discussed the performance of that with injection of radioactive tracer and blue dye. We discussed that she would have an incision underneath her axillary hairline. We discussed that there is a bout a 10-20% chance of having a positive node with a sentinel lymph node biopsy and we will await the permanent pathology to make any other first further decisions in terms of her treatment. One of these options might be to return to the operating room to perform an axillary lymph node dissection. We discussed about a 1-2% risk lifetime of chronic shoulder pain as well as lymphedema associated with a sentinel lymph node biopsy. We discussed the options for treatment of the breast cancer which included lumpectomy versus a mastectomy. We discussed the performance of the lumpectomy with a wire placement. We discussed a 10-20% chance of a positive margin requiring reexcision in the operating room. We also discussed that she may need radiation therapy or  antiestrogen therapy or both if she undergoes lumpectomy. We discussed the mastectomy and the postoperative care for that as well. We discussed that there is no difference in her survival whether she undergoes lumpectomy with radiation therapy or antiestrogen therapy versus a mastectomy. There is a slight difference in the local recurrence rate being 3-5% with lumpectomy and about 1% with a mastectomy. We discussed the risks of operation including bleeding, infection, possible reoperation. She understands her further therapy will

## 2019-09-07 NOTE — Interval H&P Note (Signed)
History and Physical Interval Note:  09/07/2019 8:15 AM  Catherine Decker  has presented today for surgery, with the diagnosis of RIGHT BREAST CANCER.  The various methods of treatment have been discussed with the patient and family. After consideration of risks, benefits and other options for treatment, the patient has consented to  Procedure(s) with comments: RIGHT SIMPLE MASTECTOMY WITH SENTINEL [place] (Right) - PEC BLOCK as a surgical intervention.  The patient's history has been reviewed, patient examined, no change in status, stable for surgery.  I have reviewed the patient's chart and labs.  Questions were answered to the patient's satisfaction.     [place]

## 2019-09-07 NOTE — Transfer of Care (Signed)
Immediate Anesthesia Transfer of Care Note  Patient: Catherine Decker  Procedure(s) Performed: RIGHT SIMPLE MASTECTOMY WITH SENTINEL LYMPH NODE MAPPING (Right Breast)  Patient Location: PACU  Anesthesia Type:General  Level of Consciousness: awake, drowsy and patient cooperative  Airway & Oxygen Therapy: Patient Spontanous Breathing and Patient connected to face mask oxygen  Post-op Assessment: Report given to RN, Post -op Vital signs reviewed and stable and Patient moving all extremities X 4  Post vital signs: Reviewed and stable  Last Vitals:  Vitals Value Taken Time  BP    Temp    Pulse 99 09/07/19 1031  Resp 18 09/07/19 1031  SpO2 97 % 09/07/19 1031  Vitals shown include unvalidated device data. BP: 135/56  Last Pain:  Vitals:   09/07/19 0800  TempSrc:   PainSc: 0-No pain      Patients Stated Pain Goal: 0 (15/48/84 5733)  Complications: No complications documented.

## 2019-09-08 ENCOUNTER — Ambulatory Visit: Payer: Medicare Other | Admitting: Hematology and Oncology

## 2019-09-08 ENCOUNTER — Other Ambulatory Visit: Payer: Medicare Other

## 2019-09-08 ENCOUNTER — Encounter (HOSPITAL_COMMUNITY): Payer: Self-pay | Admitting: Surgery

## 2019-09-08 DIAGNOSIS — C50411 Malignant neoplasm of upper-outer quadrant of right female breast: Secondary | ICD-10-CM | POA: Diagnosis not present

## 2019-09-08 LAB — CBC
HCT: 33 % — ABNORMAL LOW (ref 36.0–46.0)
Hemoglobin: 10.8 g/dL — ABNORMAL LOW (ref 12.0–15.0)
MCH: 32.3 pg (ref 26.0–34.0)
MCHC: 32.7 g/dL (ref 30.0–36.0)
MCV: 98.8 fL (ref 80.0–100.0)
Platelets: 59 10*3/uL — ABNORMAL LOW (ref 150–400)
RBC: 3.34 MIL/uL — ABNORMAL LOW (ref 3.87–5.11)
RDW: 12.8 % (ref 11.5–15.5)
WBC: 3.3 10*3/uL — ABNORMAL LOW (ref 4.0–10.5)
nRBC: 0 % (ref 0.0–0.2)

## 2019-09-08 LAB — COMPREHENSIVE METABOLIC PANEL
ALT: 15 U/L (ref 0–44)
AST: 26 U/L (ref 15–41)
Albumin: 2.9 g/dL — ABNORMAL LOW (ref 3.5–5.0)
Alkaline Phosphatase: 31 U/L — ABNORMAL LOW (ref 38–126)
Anion gap: 8 (ref 5–15)
BUN: 17 mg/dL (ref 8–23)
CO2: 21 mmol/L — ABNORMAL LOW (ref 22–32)
Calcium: 8.7 mg/dL — ABNORMAL LOW (ref 8.9–10.3)
Chloride: 108 mmol/L (ref 98–111)
Creatinine, Ser: 1.29 mg/dL — ABNORMAL HIGH (ref 0.44–1.00)
GFR calc Af Amer: 46 mL/min — ABNORMAL LOW (ref >60)
GFR calc non Af Amer: 40 mL/min — ABNORMAL LOW (ref >60)
Glucose, Bld: 110 mg/dL — ABNORMAL HIGH (ref 70–99)
Potassium: 4 mmol/L (ref 3.5–5.1)
Sodium: 137 mmol/L (ref 135–145)
Total Bilirubin: 0.8 mg/dL (ref 0.3–1.2)
Total Protein: 5.5 g/dL — ABNORMAL LOW (ref 6.5–8.1)

## 2019-09-08 LAB — GLUCOSE, CAPILLARY: Glucose-Capillary: 99 mg/dL (ref 70–99)

## 2019-09-08 MED ORDER — OXYCODONE HCL 5 MG PO TABS: 5.0000 mg | ORAL_TABLET | Freq: Three times a day (TID) | ORAL | 0 refills | Status: DC | PRN

## 2019-09-08 NOTE — Discharge Summary (Signed)
Physician Discharge Summary  Patient ID: ARFA LAMARCA MRN: [id number] DOB/AGE: Jun 30, 1940 79 y.o.  Admit date: 09/07/2019 Discharge date: 09/08/2019  Admission Diagnoses:Active Problems:   Breast cancer of upper-outer quadrant of right female breast [facility])  Discharge Diagnoses:  Active Problems:   Breast cancer of upper-outer quadrant of right female breast [facility])   Discharged Condition: good  Hospital Course: Patient doing well.  JP drainage serous.  Flaps are viable.  She had no pain.  She was tolerating her diet.  She was able to ambulate without difficulty discharged on postop day 1.    Significant Diagnostic Studies: labs:  CBC    Component Value Date/Time   WBC 3.3 (L) 09/08/2019 0256   RBC 3.34 (L) 09/08/2019 0256   HGB 10.8 (L) 09/08/2019 0256   HGB 12.5 06/29/2019 1025   HCT 33.0 (L) 09/08/2019 0256   PLT 59 (L) 09/08/2019 0256   PLT 92 (L) 06/29/2019 1025   MCV 98.8 09/08/2019 0256   MCH 32.3 09/08/2019 0256   MCHC 32.7 09/08/2019 0256   RDW 12.8 09/08/2019 0256   LYMPHSABS 0.8 09/02/2019 0938   MONOABS 0.2 09/02/2019 0938   EOSABS 0.1 09/02/2019 0938   BASOSABS 0.0 09/02/2019 0938    Treatments: surgery: Right simple mastectomy with right axillary lymph node dissection  Discharge Exam: Blood pressure (!) 105/44, pulse 64, temperature 98 F (36.7 C), temperature source Oral, resp. rate 16, height 5' 3"  (1.6 m), weight 80.4 kg, SpO2 98 %. General appearance: alert and cooperative Resp: clear to auscultation bilaterally Breasts: normal appearance, no masses or tenderness, Right breast mastectomy wound clean dry intact.  Skin flaps are viable.  JP drain serous. Cardio: regular rate and rhythm, S1, S2 normal, no murmur, click, rub or gallop Incision/Wound: Clean dry intact  Disposition: Discharge disposition: 01-Home or Self Care       Discharge Instructions    Diet - low sodium heart healthy   Complete by: As directed    Increase activity slowly    Complete by: As directed      Allergies as of 09/08/2019      Reactions   Demerol Anaphylaxis   Meperidine Anaphylaxis      Medication List    TAKE these medications   cholecalciferol 25 MCG (1000 UNIT) tablet Commonly known as: VITAMIN D3 Take 1,000 Units by mouth daily.   CINNAMON PO Take 500 mg by mouth daily.   Coenzyme Q10 100 MG capsule Take 100 mg by mouth daily.   Euthyrox 137 MCG tablet Generic drug: levothyroxine Take 137 mcg by mouth daily before breakfast.   fenofibrate 160 MG tablet Take 160 mg by mouth daily.   Fish Oil 600 MG Caps Take 600 mg by mouth daily.   folic acid 1 MG tablet Commonly known as: FOLVITE Take 1 tablet (1 mg total) by mouth daily.   Ginkgo Biloba 60 MG Caps Take 60 mg by mouth daily.   GLUCOSAMINE CHOND COMPLEX/MSM PO Take 1 tablet by mouth daily.   losartan 25 MG tablet Commonly known as: COZAAR Take 12.5 mg by mouth daily.   metFORMIN 500 MG tablet Commonly known as: GLUCOPHAGE Take 500 mg by mouth daily with breakfast.   omeprazole 20 MG capsule Commonly known as: PRILOSEC Take 20 mg by mouth daily as needed (acid reflux).   oxyCODONE 5 MG immediate release tablet Commonly known as: Roxicodone Take 1 tablet (5 mg total) by mouth every 8 (eight) hours as needed.   pioglitazone 15 MG  tablet Commonly known as: ACTOS Take 15 mg by mouth daily.   QC TUMERIC COMPLEX PO Take by mouth.   simvastatin 20 MG tablet Commonly known as: ZOCOR Take 20 mg by mouth every evening.   vitamin B-12 1000 MCG tablet Commonly known as: CYANOCOBALAMIN Take 1,000 mcg by mouth daily.        Signed: Turner Daniels MD  09/08/2019, 8:47 AM

## 2019-09-08 NOTE — Discharge Instructions (Signed)
[facility] [phone]  MASTECTOMY: POST OP INSTRUCTIONS  Always review your discharge instruction sheet given to you by the facility where your surgery was performed. IF YOU HAVE DISABILITY OR FAMILY LEAVE FORMS, YOU MUST BRING THEM TO THE OFFICE FOR PROCESSING.   DO NOT GIVE THEM TO YOUR DOCTOR. A prescription for pain medication may be given to you upon discharge.  Take your pain medication as prescribed, if needed.  If narcotic pain medicine is not needed, then you may take acetaminophen (Tylenol) or ibuprofen (Advil) as needed. 1. Take your usually prescribed medications unless otherwise directed. 2. If you need a refill on your pain medication, please contact your pharmacy.  They will contact our office to request authorization.  Prescriptions will not be filled after 5pm or on week-ends. 3. You should follow a light diet the first few days after arrival home, such as soup and crackers, etc.  Resume your normal diet the day after surgery. 4. Most patients will experience some swelling and bruising on the chest and underarm.  Ice packs will help.  Swelling and bruising can take several days to resolve.  5. It is common to experience some constipation if taking pain medication after surgery.  Increasing fluid intake and taking a stool softener (such as Colace) will usually help or prevent this problem from occurring.  A mild laxative (Milk of Magnesia or Miralax) should be taken according to package instructions if there are no bowel movements after 48 hours. 6. Unless discharge instructions indicate otherwise, leave your bandage dry and in place until your next appointment in 3-5 days.  You may take a limited sponge bath.  No tube baths or showers until the drains are removed.  You may have steri-strips (small skin tapes) in place directly over the incision.  These strips should be left on the skin for 7-10 days.  If your surgeon used skin glue on the incision, you may  shower in 24 hours.  The glue will flake off over the next 2-3 weeks.  Any sutures or staples will be removed at the office during your follow-up visit. 7. DRAINS:  If you have drains in place, it is important to keep a list of the amount of drainage produced each day in your drains.  Before leaving the hospital, you should be instructed on drain care.  Call our office if you have any questions about your drains. 8. ACTIVITIES:  You may resume regular (light) daily activities beginning the next day--such as daily self-care, walking, climbing stairs--gradually increasing activities as tolerated.  You may have sexual intercourse when it is comfortable.  Refrain from any heavy lifting or straining until approved by your doctor. a. You may drive when you are no longer taking prescription pain medication, you can comfortably wear a seatbelt, and you can safely maneuver your car and apply brakes. b. RETURN TO WORK:  __________________________________________________________ 9. You should see your doctor in the office for a follow-up appointment approximately 3-5 days after your surgery.  Your doctors nurse will typically make your follow-up appointment when she calls you with your pathology report.  Expect your pathology report 2-3 business days after your surgery.  You may call to check if you do not hear from [place] after three days.   10. OTHER INSTRUCTIONS: ______________________________________________________________________________________________ ____________________________________________________________________________________________ WHEN TO CALL YOUR DOCTOR: 1. Fever over 101.0 2. Nausea and/or vomiting 3. Extreme swelling or bruising 4. Continued bleeding from incision. 5. Increased pain, redness, or drainage from the incision.  The clinic staff is available to answer your questions during regular business hours.  Please dont hesitate to call and ask to speak to one of the nurses for clinical  concerns.  If you have a medical emergency, go to the nearest emergency room or call 911.  A surgeon from [facility] is always on call at the hospital. [address] [place], [place], [place]  [address] ? [address], [place], [place]   [address] [phone] ? [phone] ? FAX [phone] Web site: [facility] Surgical drains are used to remove extra fluid that normally builds up in a surgical wound after surgery. A surgical drain helps to heal a surgical wound. Different kinds of surgical drains include:  Active drains. These drains use suction to pull drainage away from the surgical wound. Drainage flows through a tube to a container outside of the body. With these drains, you need to keep the bulb or the drainage container flat (compressed) at all times, except while you empty it. Flattening the bulb or container creates suction.  Passive drains. These drains allow fluid to drain naturally, by gravity. Drainage flows through a tube to a bandage (dressing) or a container outside of the body. Passive drains do not need to be emptied. A drain is placed during surgery. Right after surgery, drainage is usually bright red and a little thicker than water. The drainage may gradually turn yellow or pink and become thinner. It is likely that your health care provider will remove the drain when the drainage stops or when the amount decreases to 1-2 Tbsp (15-30 mL) during a 24-hour period. Supplies needed:  Tape.  Germ-free cleaning solution (sterile saline).  Cotton swabs.  Split gauze drain sponge: 4 x 4 inches (10 x 10 cm).  Gauze square: 4 x 4 inches (10 x 10 cm). How to care for your surgical drain Care for your drain as told by your health care provider. This is important to help prevent infection. If your drain is placed at your back, or any other hard-to-reach area, ask another person to assist you in performing the following tasks: General  care  Keep the skin around the drain dry and covered with a dressing at all times.  Check your drain area every day for signs of infection. Check for: ? Redness, swelling, or pain. ? Pus or a bad smell. ? Cloudy drainage. ? Tenderness or pressure at the drain exit site. Changing the dressing Follow instructions from your health care provider about how to change your dressing. Change your dressing at least once a day. Change it more often if needed to keep the dressing dry. Make sure you: 1. Gather your supplies. 2. Wash your hands with soap and water before you change your dressing. If soap and water are not available, use hand sanitizer. 3. Remove the old dressing. Avoid using scissors to do that. 4. Wash your hands with soap and water again after removing the old dressing. 5. Use sterile saline to clean your skin around the drain. You may need to use a cotton swab to clean the skin. 6. Place the tube through the slit in a drain sponge. Place the drain sponge so that it covers your wound. 7. Place the gauze square or another drain sponge on top of the drain sponge that is on the wound. Make sure the tube is between those layers. 8. Tape the dressing to your skin. 9. Tape the drainage tube to your  skin 1-2 inches (2.5-5 cm) below the place where the tube enters your body. Taping keeps the tube from pulling on any stitches (sutures) that you have. 10. Wash your hands with soap and water. 11. Write down the color of your drainage and how often you change your dressing. How to empty your active drain  1. Make sure that you have a measuring cup that you can empty your drainage into. 2. Wash your hands with soap and water. If soap and water are not available, use hand sanitizer. 3. Loosen any pins or clips that hold the tube in place. 4. If your health care provider tells you to strip the tube to prevent clots and tube blockages: ? Hold the tube at the skin with one hand. Use your other hand  to pinch the tubing with your thumb and first finger. ? Gently move your fingers down the tube while squeezing very lightly. This clears any drainage, clots, or tissue from the tube. ? You may need to do this several times each day to keep the tube clear. Do not pull on the tube. 5. Open the bulb cap or the drain plug. Do not touch the inside of the cap or the bottom of the plug. 6. Turn the device upside down and gently squeeze. 7. Empty all of the drainage into the measuring cup. 8. Compress the bulb or the container and replace the cap or the plug. To compress the bulb or the container, squeeze it firmly in the middle while you close the cap or plug the container. 9. Write down the amount of drainage that you have in each 24-hour period. If you have less than 2 Tbsp (30 mL) of drainage during 24 hours, contact your health care provider. 10. Flush the drainage down the toilet. 11. Wash your hands with soap and water. Contact a health care provider if:  You have redness, swelling, or pain around your drain area.  You have pus or a bad smell coming from your drain area.  You have a fever or chills.  The skin around your drain is warm to the touch.  The amount of drainage that you have is increasing instead of decreasing.  You have drainage that is cloudy.  There is a sudden stop or a sudden decrease in the amount of drainage that you have.  Your drain tube falls out.  Your active drain does not stay compressed after you empty it. Summary  Surgical drains are used to remove extra fluid that normally builds up in a surgical wound after surgery.  Different kinds of surgical drains include active drains and passive drains. Active drains use suction to pull drainage away from the surgical wound, and passive drains allow fluid to drain naturally.  It is important to care for your drain to prevent infection. If your drain is placed at your back, or any other hard-to-reach area, ask  another person to assist you.  Contact your health care provider if you have redness, swelling, or pain around your drain area. This information is not intended to replace advice given to you by your health care provider. Make sure you discuss any questions you have with your health care provider. Document Revised: 03/31/2018 Document Reviewed: 03/31/2018 [employer] Patient Education  2020 [employer].

## 2019-09-09 ENCOUNTER — Other Ambulatory Visit: Payer: Self-pay

## 2019-09-13 LAB — SURGICAL PATHOLOGY

## 2019-09-19 ENCOUNTER — Other Ambulatory Visit: Payer: Self-pay

## 2019-09-19 ENCOUNTER — Ambulatory Visit
Admission: RE | Admit: 2019-09-19 | Discharge: 2019-09-19 | Disposition: A | Discharge status: Home / Self Care | Payer: Medicare Other | Source: Ambulatory Visit | Attending: Family Medicine | Admitting: Family Medicine

## 2019-09-19 DIAGNOSIS — M8580 Other specified disorders of bone density and structure, unspecified site: Secondary | ICD-10-CM

## 2019-09-26 ENCOUNTER — Encounter: Payer: Self-pay | Admitting: Endocrinology

## 2019-09-26 ENCOUNTER — Other Ambulatory Visit (HOSPITAL_COMMUNITY): Payer: Medicare Other

## 2019-09-29 ENCOUNTER — Encounter (HOSPITAL_COMMUNITY): Payer: Medicare Other

## 2019-10-05 ENCOUNTER — Other Ambulatory Visit: Payer: Self-pay | Admitting: Hematology and Oncology

## 2019-10-05 ENCOUNTER — Inpatient Hospital Stay: Payer: Medicare Other | Attending: Hematology and Oncology | Admitting: Hematology and Oncology

## 2019-10-05 ENCOUNTER — Other Ambulatory Visit: Payer: Self-pay

## 2019-10-05 ENCOUNTER — Inpatient Hospital Stay: Payer: Medicare Other

## 2019-10-05 VITALS — BP 118/48 | HR 78 | Temp 97.5°F | Resp 18 | Ht 62.75 in | Wt 171.5 lb

## 2019-10-05 DIAGNOSIS — Z923 Personal history of irradiation: Secondary | ICD-10-CM | POA: Insufficient documentation

## 2019-10-05 DIAGNOSIS — D696 Thrombocytopenia, unspecified: Secondary | ICD-10-CM | POA: Diagnosis not present

## 2019-10-05 DIAGNOSIS — C50911 Malignant neoplasm of unspecified site of right female breast: Secondary | ICD-10-CM | POA: Diagnosis not present

## 2019-10-05 DIAGNOSIS — Z853 Personal history of malignant neoplasm of breast: Secondary | ICD-10-CM | POA: Diagnosis not present

## 2019-10-05 DIAGNOSIS — K7460 Unspecified cirrhosis of liver: Secondary | ICD-10-CM

## 2019-10-05 DIAGNOSIS — Z170 Estrogen receptor positive status [ER+]: Secondary | ICD-10-CM | POA: Diagnosis not present

## 2019-10-05 DIAGNOSIS — Z79899 Other long term (current) drug therapy: Secondary | ICD-10-CM | POA: Diagnosis not present

## 2019-10-05 DIAGNOSIS — D72819 Decreased white blood cell count, unspecified: Secondary | ICD-10-CM | POA: Insufficient documentation

## 2019-10-05 LAB — CMP (CANCER CENTER ONLY)
ALT: 13 U/L (ref 0–44)
AST: 30 U/L (ref 15–41)
Albumin: 3.4 g/dL — ABNORMAL LOW (ref 3.5–5.0)
Alkaline Phosphatase: 44 U/L (ref 38–126)
Anion gap: 7 (ref 5–15)
BUN: 16 mg/dL (ref 8–23)
CO2: 24 mmol/L (ref 22–32)
Calcium: 9.8 mg/dL (ref 8.9–10.3)
Chloride: 110 mmol/L (ref 98–111)
Creatinine: 1.18 mg/dL — ABNORMAL HIGH (ref 0.44–1.00)
GFR, Est AFR Am: 52 mL/min — ABNORMAL LOW (ref >60)
GFR, Estimated: 44 mL/min — ABNORMAL LOW (ref >60)
Glucose, Bld: 110 mg/dL — ABNORMAL HIGH (ref 70–99)
Potassium: 4.4 mmol/L (ref 3.5–5.1)
Sodium: 141 mmol/L (ref 135–145)
Total Bilirubin: 0.8 mg/dL (ref 0.3–1.2)
Total Protein: 6.6 g/dL (ref 6.5–8.1)

## 2019-10-05 LAB — CBC WITH DIFFERENTIAL (CANCER CENTER ONLY)
Abs Immature Granulocytes: 0.01 10*3/uL (ref 0.00–0.07)
Basophils Absolute: 0 10*3/uL (ref 0.0–0.1)
Basophils Relative: 1 %
Eosinophils Absolute: 0.2 10*3/uL (ref 0.0–0.5)
Eosinophils Relative: 5 %
HCT: 37.5 % (ref 36.0–46.0)
Hemoglobin: 12.4 g/dL (ref 12.0–15.0)
Immature Granulocytes: 0 %
Lymphocytes Relative: 24 %
Lymphs Abs: 0.7 10*3/uL (ref 0.7–4.0)
MCH: 31.4 pg (ref 26.0–34.0)
MCHC: 33.1 g/dL (ref 30.0–36.0)
MCV: 94.9 fL (ref 80.0–100.0)
Monocytes Absolute: 0.3 10*3/uL (ref 0.1–1.0)
Monocytes Relative: 8 %
Neutro Abs: 1.8 10*3/uL (ref 1.7–7.7)
Neutrophils Relative %: 62 %
Platelet Count: 80 10*3/uL — ABNORMAL LOW (ref 150–400)
RBC: 3.95 MIL/uL (ref 3.87–5.11)
RDW: 12.8 % (ref 11.5–15.5)
WBC Count: 3 10*3/uL — ABNORMAL LOW (ref 4.0–10.5)
nRBC: 0 % (ref 0.0–0.2)

## 2019-10-05 NOTE — Progress Notes (Signed)
Catherine Decker [phone]   [phone]  PROGRESS NOTE  Patient Care Team: Catherine Contras, MD as PCP - General (Family Medicine)  Hematological/Oncological History #Leukopenia, Lymphopenia #Thrombocytopenia 1) 05/15/2010: WBC 4.5, Hgb 14.1, Plt 124, MCV 88.5. ANC 2.9, Lymph 1.1. Earliest CBC on record.  2) 01/25/2019: WBC 3.0, Hgb 12.8, Plt 101, MCV 93.3. ANC 1.8, Lymph 0.8, Mono 0.2 3) 03/15/2019: WBC 2.5, Hgb 12.9, Plt 96. MCV 93.6. ANC 1.5, Lymph 0.7, Mono 0.2 4) 03/31/2019: establish care with Dr. Lorenso Decker    #Recurrent Breast Cancer  1) underwent lumpectomy and radiation therapy in 2000 on right breast cancer  2) declined chemotherapy and only received 6 months of tamoxifen therapy 3) 06/16/2019: Mammogram showed a new 1.4 cm mass has been noted in the right breast. Biopsy performed showed 90% ER, 20% PR, and HER2 negative. 4) 07/01/2019: clinic visit scheduled with breast surgery  5) 09/07/2019: right simple mastectomy performed with sentinel lymph node mapping. Findings showed an invasive lobular carcinoma 1.4 cm with no evidence of lymph node involvement.   Interval History:  Catherine Decker 79 y.o. female with medical history significant for leukopenia/thrombocytopenia who presents for a follow up visit. The patient's last visit was on 06/29/2019 at which time her blood counts were stable. In the interim since the last visit she has had her simple mastectomy performed, confirming invasive lobular carcinoma.   Catherine Decker notes that he underwent surgery for a simple mastectomy on September 07, 2019.  She notes that there have been some difficulties in the postoperative setting with having a continued drain in place and issues with the skin flap.  She notes that it is uncomfortable, but she has been tolerating it well.  Otherwise she currently denies having any issues with fevers, chills, sweats, nausea, running or diarrhea.  On further discussion she notes that  she was previously on tamoxifen for about 6 months when last treated with breast cancer in the year 2000.  She notes that it "made me bleed" and by that she was referring to GYN bleeding.  She is also agreeable to considering aromatase inhibitor therapy for her breast cancer.  Otherwise she currently denies having issues with fevers, chills, sweats, nausea, vomiting or diarrhea.  She notes that she has not had any issues with bleeding, bruising, or dark stools.  A full 10 point ROS is listed below.  MEDICAL HISTORY:  Past Medical History:  Diagnosis Date  . Anemia   . Arthritis of back   . Arthritis of hand    per patient right hand  . Autoimmune deficiency syndrome ([place])    per patient  . Breast cancer ([place])   . Cancer [facility])    right breast   . Cirrhosis of liver ([place])   . Complication of anesthesia    per patient, slow to awake  . Diabetes mellitus   . GERD (gastroesophageal reflux disease)    Also, Hiatal Hernia   . H/O migraine   . Hepatitis    per patient, had infectious hepatitis as a 79 year old  . Hyperlipidemia   . Hypertension   . Hypothyroidism   . Splenic artery aneurysm [facility])     SURGICAL HISTORY: Past Surgical History:  Procedure Laterality Date  . APPENDECTOMY    . BREAST LUMPECTOMY     Right breast lumpectomy  . CATARACT EXTRACTION W/ INTRAOCULAR LENS  IMPLANT, BILATERAL  2018  . CHOLECYSTECTOMY  04/2010  . MASTECTOMY W/ SENTINEL NODE BIOPSY Right  09/07/2019   Procedure: RIGHT SIMPLE MASTECTOMY WITH SENTINEL LYMPH NODE MAPPING;  Surgeon: Erroll Luna, MD;  Location: [facility];  Service: General;  Laterality: Right;  PEC BLOCK  . TONSILLECTOMY    . TUBAL LIGATION  1975    ALLERGIES:  is allergic to demerol and meperidine.  MEDICATIONS:  Current Outpatient Medications  Medication Sig Dispense Refill  . calcium carbonate (CALCIUM 600) 600 MG TABS tablet Take 600 mg by mouth daily with breakfast.    . cholecalciferol (VITAMIN D3) 25 MCG (1000 UNIT) tablet  Take 1,000 Units by mouth daily.    Marland Kitchen CINNAMON PO Take 500 mg by mouth daily.     . Coenzyme Q10 100 MG capsule Take 100 mg by mouth daily.     . fenofibrate 160 MG tablet Take 160 mg by mouth daily.    . folic acid (FOLVITE) 1 MG tablet Take 1 tablet (1 mg total) by mouth daily. 90 tablet 3  . Ginkgo Biloba 60 MG CAPS Take 60 mg by mouth daily.    Marland Kitchen levothyroxine (SYNTHROID) 125 MCG tablet Take 125 mcg by mouth daily.    Marland Kitchen losartan (COZAAR) 25 MG tablet Take 12.5 mg by mouth daily.     . metFORMIN (GLUCOPHAGE) 500 MG tablet Take 500 mg by mouth daily with breakfast.    . Misc Natural Products (GLUCOSAMINE CHOND COMPLEX/MSM PO) Take 1 tablet by mouth daily.    . Omega-3 Fatty Acids (FISH OIL) 600 MG CAPS Take 600 mg by mouth daily.    Marland Kitchen omeprazole (PRILOSEC) 20 MG capsule Take 20 mg by mouth daily as needed (acid reflux).     Marland Kitchen oxyCODONE (ROXICODONE) 5 MG immediate release tablet Take 1 tablet (5 mg total) by mouth every 8 (eight) hours as needed. (Patient not taking: Reported on 10/05/2019) 10 tablet 0  . pioglitazone (ACTOS) 15 MG tablet Take 15 mg by mouth daily.    . simvastatin (ZOCOR) 20 MG tablet Take 20 mg by mouth every evening.    . Turmeric (QC TUMERIC COMPLEX PO) Take by mouth. (Patient not taking: Reported on 10/05/2019)    . vitamin B-12 (CYANOCOBALAMIN) 1000 MCG tablet Take 1,000 mcg by mouth daily.     No current facility-administered medications for this visit.    REVIEW OF SYSTEMS:   Constitutional: ( - ) fevers, ( - )  chills , ( - ) night sweats Eyes: ( - ) blurriness of vision, ( - ) double vision, ( - ) watery eyes Ears, nose, mouth, throat, and face: ( - ) mucositis, ( - ) sore throat Respiratory: ( - ) cough, ( - ) dyspnea, ( - ) wheezes Cardiovascular: ( - ) palpitation, ( - ) chest discomfort, ( - ) lower extremity swelling Gastrointestinal:  ( - ) nausea, ( - ) heartburn, ( - ) change in bowel habits Skin: ( - ) abnormal skin rashes Lymphatics: ( - ) new  lymphadenopathy, ( - ) easy bruising Neurological: ( - ) numbness, ( - ) tingling, ( - ) new weaknesses Behavioral/Psych: ( - ) mood change, ( - ) new changes  All other systems were reviewed with the patient and are negative.  PHYSICAL EXAMINATION: ECOG PERFORMANCE STATUS: 1 - Symptomatic but completely ambulatory  Vitals:   10/05/19 1125  BP: (!) 118/48  Pulse: 78  Resp: 18  Temp: (!) 97.5 F (36.4 C)  SpO2: 97%   Filed Weights   10/05/19 1125  Weight: 171 lb 8 oz (77.8  kg)    GENERAL: well appearing elderly Caucasian female in NAD  SKIN: skin color, texture, turgor are normal, no rashes or significant lesions EYES: conjunctiva are pink and non-injected, sclera clear BREAST: s/p simple mastectomy. No evidence of infection, well healing incision. Drains in place.  LUNGS: clear to auscultation and percussion with normal breathing effort HEART: regular rate & rhythm and no murmurs and no lower extremity edema ABDOMEN: soft, non-tender, non-distended, normal bowel sounds Musculoskeletal: no cyanosis of digits and no clubbing  PSYCH: alert & oriented x 3, fluent speech NEURO: no focal motor/sensory deficits  LABORATORY DATA:  I have reviewed the data as listed CBC Latest Ref Rng & Units 10/05/2019 09/08/2019 09/02/2019  WBC 4.0 - 10.5 K/uL 3.0(L) 3.3(L) 3.0(L)  Hemoglobin 12.0 - 15.0 g/dL 12.4 10.8(L) 12.7  Hematocrit 36 - 46 % 37.5 33.0(L) 39.0  Platelets 150 - 400 K/uL 80(L) 59(L) 76(L)    CMP Latest Ref Rng & Units 10/05/2019 09/08/2019 09/02/2019  Glucose 70 - 99 mg/dL 110(H) 110(H) 140(H)  BUN 8 - 23 mg/dL 16 17 17   Creatinine 0.44 - 1.00 mg/dL 1.18(H) 1.29(H) 1.16(H)  Sodium 135 - 145 mmol/L 141 137 141  Potassium 3.5 - 5.1 mmol/L 4.4 4.0 4.4  Chloride 98 - 111 mmol/L 110 108 109  CO2 22 - 32 mmol/L 24 21(L) 23  Calcium 8.9 - 10.3 mg/dL 9.8 8.7(L) 9.4  Total Protein 6.5 - 8.1 g/dL 6.6 5.5(L) 6.6  Total Bilirubin 0.3 - 1.2 mg/dL 0.8 0.8 0.8  Alkaline Phos 38 - 126 U/L  44 31(L) 41  AST 15 - 41 U/L 30 26 34  ALT 0 - 44 U/L 13 15 20      RADIOGRAPHIC STUDIES:  DG BONE DENSITY (DXA)  Result Date: 09/19/2019 EXAM: DUAL X-RAY ABSORPTIOMETRY (DXA) FOR BONE MINERAL DENSITY IMPRESSION: Referring Physician:  Antony Decker Your patient completed a BMD test using Lunar IDXA DXA system ( analysis version: 16 ) manufactured by [employer]. Technologist: AD PATIENT: Name: Alexya, Mcdaris Patient ID: [id number] Birth Date: March 31, 1940 Height: 62.5 in. Sex: Female Measured: 09/19/2019 Weight: 172.0 lbs. Indications: Advanced Age, Breast Cancer History, Caucasian, Glucocorticoids (Chronic) (255.41), History of Fracture (Adult) (V15.51), Hypothyroid, Hysterectomy, Levothyroxine, Omeprazole, Postmenopausal Fractures: Tib/Fib Treatments: Vitamin D (E933.5) ASSESSMENT: The BMD measured at Femur Neck Left is 0.655 g/cm2 with a T-score of -2.8. This patient is considered osteoporotic according to [facility]) criteria. The scan quality is good. Site Region Measured Date Measured Age YA BMD Significant CHANGE T-score DualFemur Neck Left  10/19/2019    77.6         -2.8    0.655 g/cm2 AP Spine  L1-L4      10/19/2019    77.6         -1.7    0.976 g/cm2 DualFemur Total Mean 09[age]19/2019    77.6         -1.0    0.877 g/cm2 World Health Organization [facility]) criteria for post-menopausal, Caucasian Women: Normal       T-score at or above -1 SD Osteopenia   T-score between -1 and -2.5 SD Osteoporosis T-score at or below -2.5 SD RECOMMENDATION: 1. All patients should optimize calcium and vitamin D intake. 2. Consider FDA approved medical therapies in postmenopausal women and men aged 78 years and older, based on the following: a. A hip or vertebral (clinical or morphometric) fracture b. T- score < or = -2.5 at the femoral neck or spine after appropriate evaluation to  exclude secondary causes c. Low bone mass (T-score between -1.0 and -2.5 at the femoral neck or spine) and a 10 year  probability of a hip fracture > or = 3% or a 10 year probability of a major osteoporosis-related fracture > or = 20% based on the [place]-adapted WHO algorithm d. Clinician judgment and/or patient preferences may indicate treatment for people with 10-year fracture probabilities above or below these levels FOLLOW-UP: Patients with diagnosis of osteoporosis or at high risk for fracture should have regular bone mineral density tests. For patients eligible for Medicare, routine testing is allowed once every 2 years. The testing frequency can be increased to one year for patients who have rapidly progressing disease, those who are receiving or discontinuing medical therapy to restore bone mass, or have additional risk factors. I have reviewed this report and agree with the above findings. [facility] Radiology Electronically Signed   By: Lowella Grip III M.D.   On: 09/19/2019 11:20    ASSESSMENT & PLAN ANNACLAIRE WALSWORTH 79 y.o. female with medical history significant for leukopenia/thrombocytopenia who presents for a follow up visit.  After review the labs discussion with the patient her findings are most consistent with leukopenia and thrombocytopenia due to chronic liver disease.  The patient has not responded to vitamin B12 or folate therapy, but we did wish to assure that her levels of these are replete.  The patient is fortunately already established with the [facility] and therefore these cytopenias could further be followed in their clinic with re-referral in the event that they were to worsen.  In terms of her breast cancer, she was found to have a new 1.5 cm lesion within the right breast on 06/16/2019.  She has already undergone biopsy of this lesion which shows an ER positive, PR positive, HER-2 negative breast cancer.  Surgical resection was performed on 09/07/2019 which revealed invasive lobular carcinoma.  #Bicytopenia: Leukopenia and Thrombocytopenia --today will order repeat CBC, CMP, and  peripheral blood film --most likely etiology at this time is the patient's cirrhosis of the liver.  --patient has been on vitamin b12 and folate in the interim with no remarkable change in her blood counts. --continue to monitor  #Cirrhosis of the Liver --unclear etiology, thought be 2/2 to childhood illness with hepatitis --following with hepatology for evaluation and [place] screening --continue to monitor   #History of Breast Cancer #Recurrence of Breast Cancer: ER+/PR+/HER2- --underwent lumpectomy and radiation therapy in 2000 --declined chemotherapy and only received 6 months of tamoxifen therapy --prior mammogram performed in 2018, new imaging performed 06/2019 --unfortunately a new 1.4 cm mass has been noted in the right breast. Biopsy performed showed 90% ER, 20% PR, and HER2 negative.  --surgical resection of this mass was performed on 09/07/2019 with findings showing invasive lobular carcinoma [place] Grade 2 of 3, 2.2 cm. Found to have a deep margin.  No carcinoma noted in lymph nodes.  -- Based on the CALOR Trial ( J Clin Oncol. [phone]) would recommend adjuvant aromatase ihibitor therapy.  --will discuss with breast team to see if patient would be a candidate for radiation to the resected tumor.  --RTC following revision surgery to consider starting letrozole therapy.   No orders of the defined types were placed in this encounter.   All questions were answered. The patient knows to call the clinic with any problems, questions or concerns.  A total of more than 30 minutes were spent on this encounter and over half of that time was  spent on counseling and coordination of care as outlined above.   Ledell Peoples, MD Department of Hematology/Oncology [place] at [facility] Phone: [phone] Pager: [phone] Email: [email or web address]  10/09/2019 5:24 PM   Literature Support:   Donley Redder Vernie Murders, et al. Efficacy  of chemotherapy for ER-negative and ER-positive isolated locoregional recurrence of breast cancer: final analysis of the CALOR trial. J Clin Oncol. 8286;75:1982-4.  --The final analysis of CALOR confirms that chemtotherapy benefits patients with resected ER-negative ILRR and does not support the use of chemotherapy for ER-positive ILRR.

## 2019-10-09 ENCOUNTER — Encounter: Payer: Self-pay | Admitting: Hematology and Oncology

## 2019-10-11 ENCOUNTER — Encounter: Payer: Self-pay | Admitting: Hematology and Oncology

## 2019-10-11 ENCOUNTER — Other Ambulatory Visit: Payer: Self-pay | Admitting: Hematology and Oncology

## 2019-10-11 MED ORDER — LETROZOLE 2.5 MG PO TABS
2.5000 mg | ORAL_TABLET | Freq: Every day | ORAL | 1 refills | Status: DC
Start: 2019-10-11 — End: 2020-01-05

## 2019-11-04 ENCOUNTER — Other Ambulatory Visit: Payer: Self-pay

## 2019-11-04 ENCOUNTER — Encounter: Payer: Self-pay | Admitting: Endocrinology

## 2019-11-04 ENCOUNTER — Ambulatory Visit: Payer: Medicare Other | Admitting: Endocrinology

## 2019-11-04 DIAGNOSIS — K7460 Unspecified cirrhosis of liver: Secondary | ICD-10-CM

## 2019-11-04 DIAGNOSIS — M810 Age-related osteoporosis without current pathological fracture: Secondary | ICD-10-CM | POA: Diagnosis not present

## 2019-11-04 LAB — BASIC METABOLIC PANEL
BUN: 20 mg/dL (ref 6–23)
CO2: 24 mEq/L (ref 19–32)
Calcium: 9.6 mg/dL (ref 8.4–10.5)
Chloride: 107 mEq/L (ref 96–112)
Creatinine, Ser: 1.21 mg/dL — ABNORMAL HIGH (ref 0.40–1.20)
GFR: 43.06 mL/min — ABNORMAL LOW (ref >60.00)
Glucose, Bld: 135 mg/dL — ABNORMAL HIGH (ref 70–99)
Potassium: 4.2 mEq/L (ref 3.5–5.1)
Sodium: 139 mEq/L (ref 135–145)

## 2019-11-04 LAB — VITAMIN D 25 HYDROXY (VIT D DEFICIENCY, FRACTURES): VITD: 34.21 ng/mL (ref 30.00–100.00)

## 2019-11-04 NOTE — Progress Notes (Signed)
Subjective:    Patient ID: Catherine Decker, female    DOB: October 28, 1940, 79 y.o.   MRN: [id number]  HPI Pt is referred by Dr Moreen Fowler, for osteoporosis.  Pt was noted to have osteoporosis in 2021.  She has never been on medication for this.  she has had just 1 bony fracture: right tibia (1978-- traumatic).  She has no history of any of the following: early menopause, multiple myeloma, renal dz, steroids, alcoholism, smoking, Vit-d deficiency, primary hyperparathyroidism.  She does not take heparin or anticonvulsants.  Last fall was 4 mos ago.   Past Medical History:  Diagnosis Date  . Anemia   . Arthritis of back   . Arthritis of hand    per patient right hand  . Autoimmune deficiency syndrome ([place])    per patient  . Breast cancer ([place])   . Cancer ([place])    right breast   . Cirrhosis ([place])   . Cirrhosis of liver ([place])   . Complication of anesthesia    per patient, slow to awake  . Diabetes mellitus   . GERD (gastroesophageal reflux disease)    Also, Hiatal Hernia   . H/O migraine   . Hepatitis    per patient, had infectious hepatitis as a 79 year old  . Hyperlipidemia   . Hypertension   . Hypothyroidism   . Splenic artery aneurysm [facility])     Past Surgical History:  Procedure Laterality Date  . APPENDECTOMY    . BREAST LUMPECTOMY     Right breast lumpectomy  . CATARACT EXTRACTION W/ INTRAOCULAR LENS  IMPLANT, BILATERAL  2018  . CHOLECYSTECTOMY  04/2010  . MASTECTOMY W/ SENTINEL NODE BIOPSY Right 09/07/2019   Procedure: RIGHT SIMPLE MASTECTOMY WITH SENTINEL LYMPH NODE MAPPING;  Surgeon: Erroll Luna, MD;  Location: [place];  Service: General;  Laterality: Right;  PEC BLOCK  . TONSILLECTOMY    . TUBAL LIGATION  1975    Social History   Socioeconomic History  . Marital status: Widowed    Spouse name: Not on file  . Number of children: 3  . Years of education: Not on file  . Highest education level: Not on file  Occupational History  . Occupation: retired  Tobacco Use    . Smoking status: Never Smoker  . Smokeless tobacco: Never Used  Vaping Use  . Vaping Use: Never used  Substance and Sexual Activity  . Alcohol use: No  . Drug use: No  . Sexual activity: Not on file  Other Topics Concern  . Not on file  Social History Narrative  . Not on file   Social Determinants of Health   Financial Resource Strain:   . Difficulty of Paying Living Expenses: Not on file  Food Insecurity:   . Worried About Charity fundraiser in the Last Year: Not on file  . Ran Out of Food in the Last Year: Not on file  Transportation Needs:   . Lack of Transportation (Medical): Not on file  . Lack of Transportation (Non-Medical): Not on file  Physical Activity:   . Days of Exercise per Week: Not on file  . Minutes of Exercise per Session: Not on file  Stress:   . Feeling of Stress : Not on file  Social Connections:   . Frequency of Communication with Friends and Family: Not on file  . Frequency of Social Gatherings with Friends and Family: Not on file  . Attends Religious Services: Not on file  .  Active Member of Clubs or Organizations: Not on file  . Attends Archivist Meetings: Not on file  . Marital Status: Not on file  Intimate Partner Violence:   . Fear of Current or Ex-Partner: Not on file  . Emotionally Abused: Not on file  . Physically Abused: Not on file  . Sexually Abused: Not on file    Current Outpatient Medications on File Prior to Visit  Medication Sig Dispense Refill  . calcium carbonate (CALCIUM 600) 600 MG TABS tablet Take 600 mg by mouth daily with breakfast.    . cholecalciferol (VITAMIN D3) 25 MCG (1000 UNIT) tablet Take 1,000 Units by mouth daily.    Marland Kitchen CINNAMON PO Take 500 mg by mouth daily.     . Coenzyme Q10 100 MG capsule Take 100 mg by mouth daily.     . fenofibrate 160 MG tablet Take 160 mg by mouth daily.    . folic acid (FOLVITE) 1 MG tablet Take 1 tablet (1 mg total) by mouth daily. 90 tablet 3  . Ginkgo Biloba 60 MG CAPS  Take 60 mg by mouth daily.    Marland Kitchen letrozole (FEMARA) 2.5 MG tablet Take 1 tablet (2.5 mg total) by mouth daily. 90 tablet 1  . levothyroxine (SYNTHROID) 125 MCG tablet Take 125 mcg by mouth daily.    Marland Kitchen losartan (COZAAR) 25 MG tablet Take 12.5 mg by mouth daily.     . metFORMIN (GLUCOPHAGE) 500 MG tablet Take 500 mg by mouth daily with breakfast.    . Omega-3 Fatty Acids (FISH OIL) 600 MG CAPS Take 600 mg by mouth daily.    Marland Kitchen omeprazole (PRILOSEC) 20 MG capsule Take 20 mg by mouth daily as needed (acid reflux).     . pioglitazone (ACTOS) 15 MG tablet Take 15 mg by mouth daily.    . simvastatin (ZOCOR) 20 MG tablet Take 20 mg by mouth every evening.    . vitamin B-12 (CYANOCOBALAMIN) 1000 MCG tablet Take 1,000 mcg by mouth daily.     No current facility-administered medications on file prior to visit.    Allergies  Allergen Reactions  . Demerol Anaphylaxis  . Meperidine Anaphylaxis    Family History  Problem Relation Age of Onset  . Diabetes Mother   . Heart disease Mother   . Hypertension Mother   . Breast cancer Mother   . Heart disease Father   . Heart attack Father   . Prostate cancer Father   . Hypertension Sister   . Varicose Veins Sister   . Hyperlipidemia Sister   . Colon cancer Sister   . Heart disease Brother   . Diabetes Brother   . Hypertension Brother   . Prostate cancer Brother   . Cirrhosis Brother   . Hypertension Brother   . Hypertension Daughter   . Hypertension Son   . Liver cancer Neg Hx   . Osteoporosis Neg Hx     BP (!) 160/82   Pulse 81   Ht 5' 2.75" (1.594 m)   Wt 172 lb (78 kg)   SpO2 98%   BMI 30.71 kg/m     Review of Systems denies weight loss, cold intolerance, and memory loss.  She has muscle cramps and back pain.       Objective:   Physical Exam VITAL SIGNS:  See vs page GENERAL: no distress MUSCULOSKELETAL: muscle bulk and strength are grossly normal.  no obvious joint swelling.  gait is normal and steady Spine: no  kyphosis  GAIT: normal and steady  Lab Results  Component Value Date   CALCIUM 9.8 10/05/2019   DEXA: Femur Neck Left is 0.655 g/cm2 with a T-score of -2.8  I have reviewed outside records, and summarized: Pt was noted to have low BMD, and referred here.  GERD was well-controlled with prilosec.  Synthroid dosage was adjusted, with normalization of TSH  TSH=0.97      Assessment & Plan:  Osteoporosis, new to me.  I advised rx GERD: this is a relative contraindication to oral bis-phosphonate   Patient Instructions  Blood tests are requested for you today.  We'll let you know about the results.  You should take a once a year infusion called "Reclast."  you will receive a phone call, about a day and time for an appointment. Take calcium 1200 mg per day, and vitamin-D, 400 units per day.  The test result may say to take more--I'll let you know Please come back for a follow-up appointment in 1 year.      Fall Prevention in the Home, Adult Falls can cause injuries and can affect people from all age groups. There are many simple things that you can do to make your home safe and to help prevent falls. Ask for help when making these changes, if needed. What actions can I take to prevent falls? General instructions  Use good lighting in all rooms. Replace any light bulbs that burn out.  Turn on lights if it is dark. Use night-lights.  Place frequently used items in easy-to-reach places. Lower the shelves around your home if necessary.  Set up furniture so that there are clear paths around it. Avoid moving your furniture around.  Remove throw rugs and other tripping hazards from the floor.  Avoid walking on wet floors.  Fix any uneven floor surfaces.  Add color or contrast paint or tape to grab bars and handrails in your home. Place contrasting color strips on the first and last steps of stairways.  When you use a stepladder, make sure that it is completely opened and that the  sides are firmly locked. Have someone hold the ladder while you are using it. Do not climb a closed stepladder.  Be aware of any and all pets. What can I do in the bathroom?      Keep the floor dry. Immediately clean up any water that spills onto the floor.  Remove soap buildup in the tub or shower on a regular basis.  Use non-skid mats or decals on the floor of the tub or shower.  Attach bath mats securely with double-sided, non-slip rug tape.  If you need to sit down while you are in the shower, use a plastic, non-slip stool.  Install grab bars by the toilet and in the tub and shower. Do not use towel bars as grab bars. What can I do in the bedroom?  Make sure that a bedside light is easy to reach.  Do not use oversized bedding that drapes onto the floor.  Have a firm chair that has side arms to use for getting dressed. What can I do in the kitchen?  Clean up any spills right away.  If you need to reach for something above you, use a sturdy step stool that has a grab bar.  Keep electrical cables out of the way.  Do not use floor polish or wax that makes floors slippery. If you must use wax, make sure that it is non-skid floor wax. What can I  do in the stairways?  Do not leave any items on the stairs.  Make sure that you have a light switch at the top of the stairs and the bottom of the stairs. Have them installed if you do not have them.  Make sure that there are handrails on both sides of the stairs. Fix handrails that are broken or loose. Make sure that handrails are as long as the stairways.  Install non-slip stair treads on all stairs in your home.  Avoid having throw rugs at the top or bottom of stairways, or secure the rugs with carpet tape to prevent them from moving.  Choose a carpet design that does not hide the edge of steps on the stairway.  Check any carpeting to make sure that it is firmly attached to the stairs. Fix any carpet that is loose or  worn. What can I do on the outside of my home?  Use bright outdoor lighting.  Regularly repair the edges of walkways and driveways and fix any cracks.  Remove high doorway thresholds.  Trim any shrubbery on the main path into your home.  Regularly check that handrails are securely fastened and in good repair. Both sides of any steps should have handrails.  Install guardrails along the edges of any raised decks or porches.  Clear walkways of debris and clutter, including tools and rocks.  Have leaves, snow, and ice cleared regularly.  Use sand or salt on walkways during winter months.  In the garage, clean up any spills right away, including grease or oil spills. What other actions can I take?  Wear closed-toe shoes that fit well and support your feet. Wear shoes that have rubber soles or low heels.  Use mobility aids as needed, such as canes, walkers, scooters, and crutches.  Review your medicines with your health care provider. Some medicines can cause dizziness or changes in blood pressure, which increase your risk of falling. Talk with your health care provider about other ways that you can decrease your risk of falls. This may include working with a physical therapist or trainer to improve your strength, balance, and endurance. Where to find more information  Centers for Disease Control and Prevention, [employer]: WebmailGuide.co.za  [employer] on Aging: BrainJudge.co.uk Contact a health care provider if:  You are afraid of falling at home.  You feel weak, drowsy, or dizzy at home.  You fall at home. Summary  There are many simple things that you can do to make your home safe and to help prevent falls.  Ways to make your home safe include removing tripping hazards and installing grab bars in the bathroom.  Ask for help when making these changes in your home. This information is not intended to replace advice given to you by your health care  provider. Make sure you discuss any questions you have with your health care provider. Document Revised: 02/06/2017 Document Reviewed: 10/09/2016 Elsevier Patient Education  2020 [employer].

## 2019-11-04 NOTE — Patient Instructions (Addendum)
Blood tests are requested for you today.  We'll let you know about the results.  You should take a once a year infusion called "Reclast."  you will receive a phone call, about a day and time for an appointment. Take calcium 1200 mg per day, and vitamin-D, 400 units per day.  The test result may say to take more--I'll let you know Please come back for a follow-up appointment in 1 year.      Fall Prevention in the Home, Adult Falls can cause injuries and can affect people from all age groups. There are many simple things that you can do to make your home safe and to help prevent falls. Ask for help when making these changes, if needed. What actions can I take to prevent falls? General instructions  Use good lighting in all rooms. Replace any light bulbs that burn out.  Turn on lights if it is dark. Use night-lights.  Place frequently used items in easy-to-reach places. Lower the shelves around your home if necessary.  Set up furniture so that there are clear paths around it. Avoid moving your furniture around.  Remove throw rugs and other tripping hazards from the floor.  Avoid walking on wet floors.  Fix any uneven floor surfaces.  Add color or contrast paint or tape to grab bars and handrails in your home. Place contrasting color strips on the first and last steps of stairways.  When you use a stepladder, make sure that it is completely opened and that the sides are firmly locked. Have someone hold the ladder while you are using it. Do not climb a closed stepladder.  Be aware of any and all pets. What can I do in the bathroom?      Keep the floor dry. Immediately clean up any water that spills onto the floor.  Remove soap buildup in the tub or shower on a regular basis.  Use non-skid mats or decals on the floor of the tub or shower.  Attach bath mats securely with double-sided, non-slip rug tape.  If you need to sit down while you are in the shower, use a plastic,  non-slip stool.  Install grab bars by the toilet and in the tub and shower. Do not use towel bars as grab bars. What can I do in the bedroom?  Make sure that a bedside light is easy to reach.  Do not use oversized bedding that drapes onto the floor.  Have a firm chair that has side arms to use for getting dressed. What can I do in the kitchen?  Clean up any spills right away.  If you need to reach for something above you, use a sturdy step stool that has a grab bar.  Keep electrical cables out of the way.  Do not use floor polish or wax that makes floors slippery. If you must use wax, make sure that it is non-skid floor wax. What can I do in the stairways?  Do not leave any items on the stairs.  Make sure that you have a light switch at the top of the stairs and the bottom of the stairs. Have them installed if you do not have them.  Make sure that there are handrails on both sides of the stairs. Fix handrails that are broken or loose. Make sure that handrails are as long as the stairways.  Install non-slip stair treads on all stairs in your home.  Avoid having throw rugs at the top or bottom of stairways,  or secure the rugs with carpet tape to prevent them from moving.  Choose a carpet design that does not hide the edge of steps on the stairway.  Check any carpeting to make sure that it is firmly attached to the stairs. Fix any carpet that is loose or worn. What can I do on the outside of my home?  Use bright outdoor lighting.  Regularly repair the edges of walkways and driveways and fix any cracks.  Remove high doorway thresholds.  Trim any shrubbery on the main path into your home.  Regularly check that handrails are securely fastened and in good repair. Both sides of any steps should have handrails.  Install guardrails along the edges of any raised decks or porches.  Clear walkways of debris and clutter, including tools and rocks.  Have leaves, snow, and ice  cleared regularly.  Use sand or salt on walkways during winter months.  In the garage, clean up any spills right away, including grease or oil spills. What other actions can I take?  Wear closed-toe shoes that fit well and support your feet. Wear shoes that have rubber soles or low heels.  Use mobility aids as needed, such as canes, walkers, scooters, and crutches.  Review your medicines with your health care provider. Some medicines can cause dizziness or changes in blood pressure, which increase your risk of falling. Talk with your health care provider about other ways that you can decrease your risk of falls. This may include working with a physical therapist or trainer to improve your strength, balance, and endurance. Where to find more information  Centers for Disease Control and Prevention, [place]: WebmailGuide.co.za  [employer] on Aging: BrainJudge.co.uk Contact a health care provider if:  You are afraid of falling at home.  You feel weak, drowsy, or dizzy at home.  You fall at home. Summary  There are many simple things that you can do to make your home safe and to help prevent falls.  Ways to make your home safe include removing tripping hazards and installing grab bars in the bathroom.  Ask for help when making these changes in your home. This information is not intended to replace advice given to you by your health care provider. Make sure you discuss any questions you have with your health care provider. Document Revised: 02/06/2017 Document Reviewed: 10/09/2016 Elsevier Patient Education  2020 [employer].

## 2019-11-07 LAB — PTH, INTACT AND CALCIUM
Calcium: 9.5 mg/dL (ref 8.6–10.4)
PTH: 6 pg/mL — ABNORMAL LOW (ref 14–64)

## 2019-12-12 ENCOUNTER — Other Ambulatory Visit: Payer: Self-pay | Admitting: *Deleted

## 2019-12-12 MED ORDER — FOLIC ACID 1 MG PO TABS
1.0000 mg | ORAL_TABLET | Freq: Every day | ORAL | 3 refills | Status: DC
Start: 2019-12-12 — End: 2022-04-10

## 2019-12-26 ENCOUNTER — Other Ambulatory Visit: Payer: Self-pay | Admitting: Hematology and Oncology

## 2020-01-20 ENCOUNTER — Other Ambulatory Visit: Payer: Self-pay | Admitting: Hematology and Oncology

## 2020-01-20 ENCOUNTER — Inpatient Hospital Stay: Payer: Medicare Other

## 2020-01-20 ENCOUNTER — Inpatient Hospital Stay: Payer: Medicare Other | Attending: Hematology and Oncology | Admitting: Hematology and Oncology

## 2020-01-20 ENCOUNTER — Encounter: Payer: Self-pay | Admitting: Hematology and Oncology

## 2020-01-20 ENCOUNTER — Other Ambulatory Visit: Payer: Self-pay

## 2020-01-20 VITALS — BP 138/47 | HR 68 | Temp 96.8°F | Resp 17 | Ht 62.75 in | Wt 174.6 lb

## 2020-01-20 DIAGNOSIS — C50911 Malignant neoplasm of unspecified site of right female breast: Secondary | ICD-10-CM | POA: Insufficient documentation

## 2020-01-20 DIAGNOSIS — Z923 Personal history of irradiation: Secondary | ICD-10-CM | POA: Diagnosis not present

## 2020-01-20 DIAGNOSIS — D72819 Decreased white blood cell count, unspecified: Secondary | ICD-10-CM | POA: Diagnosis not present

## 2020-01-20 DIAGNOSIS — Z79899 Other long term (current) drug therapy: Secondary | ICD-10-CM | POA: Insufficient documentation

## 2020-01-20 DIAGNOSIS — Z170 Estrogen receptor positive status [ER+]: Secondary | ICD-10-CM | POA: Insufficient documentation

## 2020-01-20 DIAGNOSIS — D696 Thrombocytopenia, unspecified: Secondary | ICD-10-CM | POA: Insufficient documentation

## 2020-01-20 DIAGNOSIS — K7460 Unspecified cirrhosis of liver: Secondary | ICD-10-CM | POA: Insufficient documentation

## 2020-01-20 DIAGNOSIS — Z79811 Long term (current) use of aromatase inhibitors: Secondary | ICD-10-CM | POA: Insufficient documentation

## 2020-01-20 DIAGNOSIS — Z9011 Acquired absence of right breast and nipple: Secondary | ICD-10-CM | POA: Insufficient documentation

## 2020-01-20 LAB — CMP (CANCER CENTER ONLY)
ALT: 14 U/L (ref 0–44)
AST: 33 U/L (ref 15–41)
Albumin: 3.4 g/dL — ABNORMAL LOW (ref 3.5–5.0)
Alkaline Phosphatase: 44 U/L (ref 38–126)
Anion gap: 8 (ref 5–15)
BUN: 20 mg/dL (ref 8–23)
CO2: 22 mmol/L (ref 22–32)
Calcium: 9 mg/dL (ref 8.9–10.3)
Chloride: 109 mmol/L (ref 98–111)
Creatinine: 1.25 mg/dL — ABNORMAL HIGH (ref 0.44–1.00)
GFR, Estimated: 44 mL/min — ABNORMAL LOW (ref >60)
Glucose, Bld: 128 mg/dL — ABNORMAL HIGH (ref 70–99)
Potassium: 4.8 mmol/L (ref 3.5–5.1)
Sodium: 139 mmol/L (ref 135–145)
Total Bilirubin: 0.7 mg/dL (ref 0.3–1.2)
Total Protein: 6.7 g/dL (ref 6.5–8.1)

## 2020-01-20 LAB — CBC WITH DIFFERENTIAL (CANCER CENTER ONLY)
Abs Immature Granulocytes: 0.01 10*3/uL (ref 0.00–0.07)
Basophils Absolute: 0 10*3/uL (ref 0.0–0.1)
Basophils Relative: 2 %
Eosinophils Absolute: 0.1 10*3/uL (ref 0.0–0.5)
Eosinophils Relative: 5 %
HCT: 36.5 % (ref 36.0–46.0)
Hemoglobin: 12.2 g/dL (ref 12.0–15.0)
Immature Granulocytes: 0 %
Lymphocytes Relative: 25 %
Lymphs Abs: 0.7 10*3/uL (ref 0.7–4.0)
MCH: 31.9 pg (ref 26.0–34.0)
MCHC: 33.4 g/dL (ref 30.0–36.0)
MCV: 95.3 fL (ref 80.0–100.0)
Monocytes Absolute: 0.2 10*3/uL (ref 0.1–1.0)
Monocytes Relative: 9 %
Neutro Abs: 1.5 10*3/uL — ABNORMAL LOW (ref 1.7–7.7)
Neutrophils Relative %: 59 %
Platelet Count: 81 10*3/uL — ABNORMAL LOW (ref 150–400)
RBC: 3.83 MIL/uL — ABNORMAL LOW (ref 3.87–5.11)
RDW: 13 % (ref 11.5–15.5)
WBC Count: 2.6 10*3/uL — ABNORMAL LOW (ref 4.0–10.5)
nRBC: 0 % (ref 0.0–0.2)

## 2020-01-20 LAB — LACTATE DEHYDROGENASE: LDH: 168 U/L (ref 98–192)

## 2020-01-20 NOTE — Progress Notes (Signed)
[place] [phone]   [phone]  PROGRESS NOTE  Patient Care Team: Antony Contras, MD as PCP - General (Family Medicine)  Hematological/Oncological History #Leukopenia, Lymphopenia #Thrombocytopenia 1) 05/15/2010: WBC 4.5, Hgb 14.1, Plt 124, MCV 88.5. ANC 2.9, Lymph 1.1. Earliest CBC on record.  2) 01/25/2019: WBC 3.0, Hgb 12.8, Plt 101, MCV 93.3. ANC 1.8, Lymph 0.8, Mono 0.2 3) 03/15/2019: WBC 2.5, Hgb 12.9, Plt 96. MCV 93.6. ANC 1.5, Lymph 0.7, Mono 0.2 4) 03/31/2019: establish care with Dr. Lorenso Courier    #Recurrent Breast Cancer  1) underwent lumpectomy and radiation therapy in 2000 on right breast cancer  2) declined chemotherapy and only received 6 months of tamoxifen therapy 3) 06/16/2019: Mammogram showed a new 1.4 cm mass has been noted in the right breast. Biopsy performed showed 90% ER, 20% PR, and HER2 negative. 4) 07/01/2019: clinic visit scheduled with Breast Surgery  5) 09/07/2019: right simple mastectomy performed with sentinel lymph node mapping. Findings showed an invasive lobular carcinoma 1.4 cm with no evidence of lymph node involvement.   Interval History:  Catherine Decker 79 y.o. female with medical history significant for leukopenia/thrombocytopenia who presents for a follow up visit. The patient's last visit was on 10/05/2019.  She was subsequently started on letrozole therapy on 10/11/2019.   Catherine Decker notes she has been well in the interim since her last visit.  She has been tolerating letrozole 2.5 mg daily with no hot flashes or sweats.  She is not noticed any difference in her health since she began taking this medication.  She had previously been tried on tamoxifen after her original surgery, but had problems with GYN bleeding.  On exam today she does endorse having new hip pain which started approximately 1 week ago.  She notes that she had an x-ray done by her chiropractor who noted that her hips "are not where they supposed to  be".  She denies any focal pain in the bones and notes that the chiropractic work has been helpful.  She mostly feels this pain when she is attempting to walk does not bother her at baseline.  She currently denies having any issues with fevers, chills, sweats, nausea, vomiting or diarrhea.  A full 10 point ROS is the blood.  MEDICAL HISTORY:  Past Medical History:  Diagnosis Date  . Anemia   . Arthritis of back   . Arthritis of hand    per patient right hand  . Autoimmune deficiency syndrome ([place])    per patient  . Breast cancer ([place])   . Cancer ([place])    right breast   . Cirrhosis ([place])   . Cirrhosis of liver ([place])   . Complication of anesthesia    per patient, slow to awake  . Diabetes mellitus   . GERD (gastroesophageal reflux disease)    Also, Hiatal Hernia   . H/O migraine   . Hepatitis    per patient, had infectious hepatitis as a 79 year old  . Hyperlipidemia   . Hypertension   . Hypothyroidism   . Splenic artery aneurysm [facility])     SURGICAL HISTORY: Past Surgical History:  Procedure Laterality Date  . APPENDECTOMY    . BREAST LUMPECTOMY     Right breast lumpectomy  . CATARACT EXTRACTION W/ INTRAOCULAR LENS  IMPLANT, BILATERAL  2018  . CHOLECYSTECTOMY  04/2010  . MASTECTOMY W/ SENTINEL NODE BIOPSY Right 09/07/2019   Procedure: RIGHT SIMPLE MASTECTOMY WITH SENTINEL LYMPH NODE MAPPING;  Surgeon: Brantley Stage,  [name], MD;  Location: [facility];  Service: General;  Laterality: Right;  PEC BLOCK  . TONSILLECTOMY    . TUBAL LIGATION  1975    ALLERGIES:  is allergic to demerol and meperidine.  MEDICATIONS:  Current Outpatient Medications  Medication Sig Dispense Refill  . calcium carbonate (CALCIUM 600) 600 MG TABS tablet Take 600 mg by mouth daily with breakfast.    . cholecalciferol (VITAMIN D3) 25 MCG (1000 UNIT) tablet Take 1,000 Units by mouth daily.    Marland Kitchen CINNAMON PO Take 500 mg by mouth daily.     . Coenzyme Q10 100 MG capsule Take 100 mg by mouth daily.     . EUTHYROX  112 MCG tablet Take 112 mcg by mouth at bedtime.    . fenofibrate 160 MG tablet Take 160 mg by mouth daily.    . folic acid (FOLVITE) 1 MG tablet Take 1 tablet (1 mg total) by mouth daily. 90 tablet 3  . Ginkgo Biloba 60 MG CAPS Take 60 mg by mouth daily.    Marland Kitchen letrozole (FEMARA) 2.5 MG tablet TAKE 1 TABLET BY MOUTH  DAILY 90 tablet 3  . losartan (COZAAR) 25 MG tablet Take 12.5 mg by mouth daily.     . metFORMIN (GLUCOPHAGE) 500 MG tablet Take 500 mg by mouth daily with breakfast.    . Omega-3 Fatty Acids (FISH OIL) 600 MG CAPS Take 600 mg by mouth daily.    Marland Kitchen omeprazole (PRILOSEC) 20 MG capsule Take 20 mg by mouth daily as needed (acid reflux).     . pioglitazone (ACTOS) 15 MG tablet Take 15 mg by mouth daily.    . simvastatin (ZOCOR) 20 MG tablet Take 20 mg by mouth every evening.    . vitamin B-12 (CYANOCOBALAMIN) 1000 MCG tablet Take 1,000 mcg by mouth daily.     No current facility-administered medications for this visit.    REVIEW OF SYSTEMS:   Constitutional: ( - ) fevers, ( - )  chills , ( - ) night sweats Eyes: ( - ) blurriness of vision, ( - ) double vision, ( - ) watery eyes Ears, nose, mouth, throat, and face: ( - ) mucositis, ( - ) sore throat Respiratory: ( - ) cough, ( - ) dyspnea, ( - ) wheezes Cardiovascular: ( - ) palpitation, ( - ) chest discomfort, ( - ) lower extremity swelling Gastrointestinal:  ( - ) nausea, ( - ) heartburn, ( - ) change in bowel habits Skin: ( - ) abnormal skin rashes Lymphatics: ( - ) new lymphadenopathy, ( - ) easy bruising Neurological: ( - ) numbness, ( - ) tingling, ( - ) new weaknesses Behavioral/Psych: ( - ) mood change, ( - ) new changes  All other systems were reviewed with the patient and are negative.  PHYSICAL EXAMINATION: ECOG PERFORMANCE STATUS: 1 - Symptomatic but completely ambulatory  Vitals:   01/20/20 0819  BP: (!) 138/47  Pulse: 68  Resp: 17  Temp: (!) 96.8 F (36 C)  SpO2: 99%   Filed Weights   01/20/20 0819   Weight: 174 lb 9.6 oz (79.2 kg)    GENERAL: well appearing elderly Caucasian female in NAD  SKIN: skin color, texture, turgor are normal, no rashes or significant lesions EYES: conjunctiva are pink and non-injected, sclera clear BREAST: s/p simple mastectomy. No evidence of infection, well healing incision. Drains in place.  LUNGS: clear to auscultation and percussion with normal breathing effort HEART: regular rate & rhythm and no  murmurs and no lower extremity edema Musculoskeletal: no cyanosis of digits and no clubbing  PSYCH: alert & oriented x 3, fluent speech NEURO: no focal motor/sensory deficits  LABORATORY DATA:  I have reviewed the data as listed CBC Latest Ref Rng & Units 01/20/2020 10/05/2019 09/08/2019  WBC 4.0 - 10.5 K/uL 2.6(L) 3.0(L) 3.3(L)  Hemoglobin 12.0 - 15.0 g/dL 12.2 12.4 10.8(L)  Hematocrit 36 - 46 % 36.5 37.5 33.0(L)  Platelets 150 - 400 K/uL 81(L) 80(L) 59(L)    CMP Latest Ref Rng & Units 01/20/2020 11/04/2019 11/04/2019  Glucose 70 - 99 mg/dL 128(H) - 135(H)  BUN 8 - 23 mg/dL 20 - 20  Creatinine 0.44 - 1.00 mg/dL 1.25(H) - 1.21(H)  Sodium 135 - 145 mmol/L 139 - 139  Potassium 3.5 - 5.1 mmol/L 4.8 - 4.2  Chloride 98 - 111 mmol/L 109 - 107  CO2 22 - 32 mmol/L 22 - 24  Calcium 8.9 - 10.3 mg/dL 9.0 9.5 9.6  Total Protein 6.5 - 8.1 g/dL 6.7 - -  Total Bilirubin 0.3 - 1.2 mg/dL 0.7 - -  Alkaline Phos 38 - 126 U/L 44 - -  AST 15 - 41 U/L 33 - -  ALT 0 - 44 U/L 14 - -     RADIOGRAPHIC STUDIES:  No results found.  ASSESSMENT & PLAN MILDRETH REEK 79 y.o. female with medical history significant for leukopenia/thrombocytopenia who presents for a follow up visit.  After review the labs discussion with the patient her findings are most consistent with leukopenia and thrombocytopenia due to chronic liver disease.  The patient has not responded to vitamin B12 or folate therapy, but we did wish to assure that her levels of these are replete.  The patient is  fortunately already established with the [facility] and therefore these cytopenias could further be followed in their clinic with re-referral in the event that they were to worsen.  In terms of her breast cancer, she was found to have a new 1.5 cm lesion within the right breast on 06/16/2019.  She has already undergone biopsy of this lesion which shows an ER positive, PR positive, HER-2 negative breast cancer.  Surgical resection was performed on 09/07/2019 which revealed invasive lobular carcinoma.  Started on letrozole therapy on 10/11/2019.  On exam today Catherine Decker is tolerating her letrozole therapy quite well.  She is having some hip pain which has been present for about 1 week's time.  She is not having any other focal symptoms.  Due to concern for this hip pain and need for baseline imaging that is postmastectomy we will order a CT chest abdomen pelvis today.  #Bicytopenia: Leukopenia and Thrombocytopenia, stable --most likely etiology at this time is the patient's cirrhosis of the liver.  --patient has been on vitamin b12 and folate in the interim with no remarkable change in her blood counts. --continue to monitor  #Cirrhosis of the Liver --unclear etiology, thought be 2/2 to childhood illness with hepatitis --following with hepatology for evaluation and [place] screening --continue to monitor   #History of Breast Cancer #Recurrence of Breast Cancer: ER+/PR+/HER2- --underwent lumpectomy and radiation therapy in 2000 --declined chemotherapy and only received 6 months of tamoxifen therapy --prior mammogram performed in 2018, new imaging performed 06/2019 --A 1.4 cm mass has been noted in the right breast. Biopsy performed showed 90% ER, 20% PR, and HER2 negative.  --surgical resection of this mass was performed on 09/07/2019 with findings showing invasive lobular carcinoma Nottingham Grade  2 of 3, 2.2 cm. Found to have a deep margin.  No carcinoma noted in lymph nodes.  -- Based on  the CALOR Trial ( J Clin Oncol. [id number]) would recommend adjuvant aromatase ihibitor therapy.  --recommend new baseline CT C/A/P due to recurrent cancer. Additionally recommended due to new hip pain.  --RTC in 3 months for continued monitoring  No orders of the defined types were placed in this encounter.   All questions were answered. The patient knows to call the clinic with any problems, questions or concerns.  A total of more than 30 minutes were spent on this encounter and over half of that time was spent on counseling and coordination of care as outlined above.   Catherine Peoples, MD Department of Hematology/Oncology [place] [facility] Phone: [phone] Pager: [phone] Email: [email or web address]  01/20/2020 8:40 AM   Literature Support:   Donley Redder Vernie Murders, et al. Efficacy of chemotherapy for ER-negative and ER-positive isolated locoregional recurrence of breast cancer: final analysis of the CALOR trial. J Clin Oncol. 4239;53:2023-3.  --The final analysis of CALOR confirms that chemtotherapy benefits patients with resected ER-negative ILRR and does not support the use of chemotherapy for ER-positive ILRR.

## 2020-01-21 ENCOUNTER — Encounter: Payer: Self-pay | Admitting: Hematology and Oncology

## 2020-01-23 ENCOUNTER — Telehealth: Payer: Self-pay | Admitting: Hematology and Oncology

## 2020-01-23 NOTE — Telephone Encounter (Signed)
Scheduled per los. Called and left msg. Mailed printout  °

## 2020-01-24 ENCOUNTER — Encounter: Payer: Self-pay | Admitting: Neurology

## 2020-01-30 NOTE — Progress Notes (Deleted)
Assessment/Plan:   1.  Essential Tremor.  -This is evidenced by the symmetrical nature and longstanding hx of gradually getting worse.  We discussed nature and pathophysiology.  We discussed that this can continue to gradually get worse with time.  We discussed that some medications can worsen this, as can caffeine use.  We discussed medication therapy as well as surgical therapy.  Ultimately, the patient decided to ***.    2.  Chronic thrombocytopenia, leukopenia, lymphopenia with history of recurrent breast cancer  -Following with hematology.  On letrozole Subjective:   Catherine Decker was seen in consultation in the movement disorder clinic at the request of Antony Contras, MD.  The evaluation is for tremor.  Tremor started approximately ***years ago and involves the ***.  Tremor is most noticeable when ***.   There is *** family hx of tremor.  Patient was worried that tremor was potentially a precursor to dementia.  Medical records made available to me are reviewed.  Affected by caffeine:  {yes no:314532} Affected by alcohol:  {yes no:314532} Affected by stress:  {yes no:314532} Affected by fatigue:  {yes no:314532} Spills soup if on spoon:  {yes no:314532} Spills glass of liquid if full:  {yes no:314532} Affects ADL's (tying shoes, brushing teeth, etc):  {yes no:314532}  Current/Previously tried tremor medications: ***  Current medications that may exacerbate tremor:  ***  Outside reports reviewed: {Outside review:15817}.  Allergies  Allergen Reactions  . Demerol Anaphylaxis  . Meperidine Anaphylaxis    Current Outpatient Medications  Medication Instructions  . calcium carbonate (CALCIUM 600) 600 mg, Oral, Daily with breakfast  . cholecalciferol (VITAMIN D3) 1,000 Units, Oral, Daily  . CINNAMON PO 500 mg, Oral, Daily  . Coenzyme Q10 100 mg, Oral, Daily  . Euthyrox 112 mcg, Oral, Daily at bedtime  . fenofibrate 160 mg, Oral, Daily  . Fish Oil 600 mg, Oral, Daily  . folic  acid (FOLVITE) 1 mg, Oral, Daily  . Ginkgo Biloba 60 mg, Oral, Daily  . letrozole (FEMARA) 2.5 MG tablet TAKE 1 TABLET BY MOUTH  DAILY  . losartan (COZAAR) 12.5 mg, Oral, Daily  . metFORMIN (GLUCOPHAGE) 500 mg, Oral, Daily with breakfast  . omeprazole (PRILOSEC) 20 mg, Oral, Daily PRN  . pioglitazone (ACTOS) 15 mg, Oral, Daily  . simvastatin (ZOCOR) 20 mg, Oral, Every evening  . vitamin B-12 (CYANOCOBALAMIN) 1,000 mcg, Oral, Daily     Objective:   VITALS:  There were no vitals filed for this visit. Gen:  Appears stated age and in NAD. HEENT:  Normocephalic, atraumatic. The mucous membranes are moist. The superficial temporal arteries are without ropiness or tenderness. Cardiovascular: Regular rate and rhythm. Lungs: Clear to auscultation bilaterally. Neck: There are no carotid bruits noted bilaterally.  NEUROLOGICAL:  Orientation:  The patient is alert and oriented x 3.   Cranial nerves: There is good facial symmetry. Extraocular muscles are intact and visual fields are full to confrontational testing. Speech is fluent and clear. Soft palate rises symmetrically and there is no tongue deviation. Hearing is intact to conversational tone. Tone: Tone is good throughout. Sensation: Sensation is intact to light touch touch throughout (facial, trunk, extremities). Vibration is intact at the bilateral big toe. There is no extinction with double simultaneous stimulation. There is no sensory dermatomal level identified. Coordination:  The patient has no dysdiadichokinesia or dysmetria. Motor: Strength is 5/5 in the bilateral upper and lower extremities.  Shoulder shrug is equal bilaterally.  There is no pronator drift.  There are  no fasciculations noted. DTR's: Deep tendon reflexes are 2/4 at the bilateral biceps, triceps, brachioradialis, patella and achilles.  Plantar responses are downgoing bilaterally. Gait and Station: The patient is able to ambulate without difficulty. The patient is able  to heel toe walk without any difficulty. The patient is able to ambulate in a tandem fashion. The patient is able to stand in the Romberg position.   MOVEMENT EXAM: Tremor:  There is *** tremor in the UE, noted most significantly with action.  The patient is *** able to draw Archimedes spirals without significant difficulty.  There is *** tremor at rest.  The patient is *** able to pour water from one glass to another without spilling it.  I have reviewed and interpreted the following labs independently   Chemistry      Component Value Date/Time   NA 139 01/20/2020 0750   K 4.8 01/20/2020 0750   CL 109 01/20/2020 0750   CO2 22 01/20/2020 0750   BUN 20 01/20/2020 0750   CREATININE 1.25 (H) 01/20/2020 0750   CREATININE 0.85 06/01/2013 1648      Component Value Date/Time   CALCIUM 9.0 01/20/2020 0750   ALKPHOS 44 01/20/2020 0750   AST 33 01/20/2020 0750   ALT 14 01/20/2020 0750   BILITOT 0.7 01/20/2020 0750      Lab Results  Component Value Date   WBC 2.6 (L) 01/20/2020   HGB 12.2 01/20/2020   HCT 36.5 01/20/2020   MCV 95.3 01/20/2020   PLT 81 (L) 01/20/2020   Lab Results  Component Value Date   TSH 0.296 (L) 03/31/2019   Patient had TSH done at primary care January 16, 2020.  Her TSH was 0.12, and her levothyroxine was decreased.   Total time spent on today's visit was ***60 minutes, including both face-to-face time and nonface-to-face time.  Time included that spent on review of records (prior notes available to me/labs/imaging if pertinent), discussing treatment and goals, answering patient's questions and coordinating care.  CC:  Antony Contras, MD

## 2020-02-06 ENCOUNTER — Ambulatory Visit: Payer: Medicare Other | Admitting: Neurology

## 2020-02-07 ENCOUNTER — Ambulatory Visit (HOSPITAL_COMMUNITY): Payer: Medicare Other

## 2020-02-07 ENCOUNTER — Telehealth: Payer: Self-pay | Admitting: *Deleted

## 2020-02-07 NOTE — Telephone Encounter (Signed)
Patient canceled CT that was ordered and scheduled by Dr. Lorenso Courier.    Routing to Dr Lorenso Courier to advise if CT is still needed prior to visit in February 2022 since it was ordered and expected for November 2021

## 2020-02-07 NOTE — Telephone Encounter (Signed)
Can we reach out to her and find out why she cancelled this scan? I would like to get it in November 2021. Please encourage her to reschedule as soon as possible.

## 2020-02-15 ENCOUNTER — Telehealth: Payer: Self-pay | Admitting: *Deleted

## 2020-02-15 NOTE — Telephone Encounter (Signed)
Received vm message from patient requesting to re-schedule her CT scan of CAP. She had bronchitis on 02/07/20 and could not make it to her scan. Re-scheduled for 02/28/20 @ 3:30 pm with arrival time of 3:15 pm. Npo starting @11 :30am. She is to drink 1st bottle of oral contrast 1:30 pm and second bottle @ 2:30 pm.  TCT patient regarding above. No answer but was bale to leave vm message for pt to return this call at her convenience @ [phone]

## 2020-02-15 NOTE — Telephone Encounter (Signed)
Opened in error

## 2020-02-23 ENCOUNTER — Telehealth: Payer: Self-pay

## 2020-02-23 NOTE — Telephone Encounter (Signed)
Called patient, LVM to call back if interested in reclast as she needed repeat blood work.   Left call back number.

## 2020-02-27 ENCOUNTER — Other Ambulatory Visit: Payer: Self-pay | Admitting: *Deleted

## 2020-02-27 ENCOUNTER — Telehealth: Payer: Self-pay | Admitting: *Deleted

## 2020-02-27 ENCOUNTER — Encounter: Payer: Self-pay | Admitting: *Deleted

## 2020-02-27 NOTE — Telephone Encounter (Signed)
TCT patient to advise that she has a lab appt tomorrow 02/28/20 @ 2:15 prior to to her CT scan @ 3:30 pm. Reviewed the schedule for drinking the oral contrast and that she is not to eat anything for 4 hours prior to scans.  Pt voiced understanding to the above.

## 2020-02-28 ENCOUNTER — Ambulatory Visit (HOSPITAL_COMMUNITY)
Admission: RE | Admit: 2020-02-28 | Discharge: 2020-02-28 | Disposition: A | Discharge status: Home / Self Care | Payer: Medicare Other | Source: Ambulatory Visit | Attending: Hematology and Oncology | Admitting: Hematology and Oncology

## 2020-02-28 ENCOUNTER — Inpatient Hospital Stay: Payer: Medicare Other | Attending: Hematology and Oncology

## 2020-02-28 ENCOUNTER — Other Ambulatory Visit: Payer: Self-pay | Admitting: *Deleted

## 2020-02-28 ENCOUNTER — Other Ambulatory Visit: Payer: Self-pay

## 2020-02-28 DIAGNOSIS — K7460 Unspecified cirrhosis of liver: Secondary | ICD-10-CM

## 2020-02-28 DIAGNOSIS — C50911 Malignant neoplasm of unspecified site of right female breast: Secondary | ICD-10-CM | POA: Insufficient documentation

## 2020-02-28 DIAGNOSIS — D696 Thrombocytopenia, unspecified: Secondary | ICD-10-CM | POA: Insufficient documentation

## 2020-02-28 LAB — CBC WITH DIFFERENTIAL (CANCER CENTER ONLY)
Abs Immature Granulocytes: 0.01 10*3/uL (ref 0.00–0.07)
Basophils Absolute: 0 10*3/uL (ref 0.0–0.1)
Basophils Relative: 1 %
Eosinophils Absolute: 0.2 10*3/uL (ref 0.0–0.5)
Eosinophils Relative: 4 %
HCT: 37.4 % (ref 36.0–46.0)
Hemoglobin: 12.4 g/dL (ref 12.0–15.0)
Immature Granulocytes: 0 %
Lymphocytes Relative: 21 %
Lymphs Abs: 0.8 10*3/uL (ref 0.7–4.0)
MCH: 31.6 pg (ref 26.0–34.0)
MCHC: 33.2 g/dL (ref 30.0–36.0)
MCV: 95.4 fL (ref 80.0–100.0)
Monocytes Absolute: 0.3 10*3/uL (ref 0.1–1.0)
Monocytes Relative: 7 %
Neutro Abs: 2.5 10*3/uL (ref 1.7–7.7)
Neutrophils Relative %: 67 %
Platelet Count: 96 10*3/uL — ABNORMAL LOW (ref 150–400)
RBC: 3.92 MIL/uL (ref 3.87–5.11)
RDW: 13 % (ref 11.5–15.5)
WBC Count: 3.8 10*3/uL — ABNORMAL LOW (ref 4.0–10.5)
nRBC: 0 % (ref 0.0–0.2)

## 2020-02-28 LAB — CMP (CANCER CENTER ONLY)
ALT: 17 U/L (ref 0–44)
AST: 34 U/L (ref 15–41)
Albumin: 3.5 g/dL (ref 3.5–5.0)
Alkaline Phosphatase: 48 U/L (ref 38–126)
Anion gap: 8 (ref 5–15)
BUN: 18 mg/dL (ref 8–23)
CO2: 23 mmol/L (ref 22–32)
Calcium: 9.5 mg/dL (ref 8.9–10.3)
Chloride: 108 mmol/L (ref 98–111)
Creatinine: 1.17 mg/dL — ABNORMAL HIGH (ref 0.44–1.00)
GFR, Estimated: 48 mL/min — ABNORMAL LOW (ref >60)
Glucose, Bld: 108 mg/dL — ABNORMAL HIGH (ref 70–99)
Potassium: 4.3 mmol/L (ref 3.5–5.1)
Sodium: 139 mmol/L (ref 135–145)
Total Bilirubin: 1 mg/dL (ref 0.3–1.2)
Total Protein: 7.2 g/dL (ref 6.5–8.1)

## 2020-02-28 IMAGING — CT CT CHEST-ABD-PELV W/ CM
2 of 5 series · 14 of 36 positions shown, 16 images · IV contrast (APPLIED)
Comparison: CT abdomen pelvis [DATE] and CT chest [DATE].

CLINICAL DATA: Breast cancer.  Ongoing oral chemotherapy.

EXAM:
CT CHEST, ABDOMEN, AND PELVIS WITH CONTRAST
TECHNIQUE: Multidetector CT imaging of the chest, abdomen and pelvis was
performed following the standard protocol during bolus
administration of intravenous contrast.
CONTRAST:  100mL OMNIPAQUE IOHEXOL 300 MG/ML  SOLN

[Series 2: cap with · axial · 0.88mm/px · z∈[-235,+315]mm · 11 of 133 slices shown, 13 images]
[im 12/133  mediastinal]
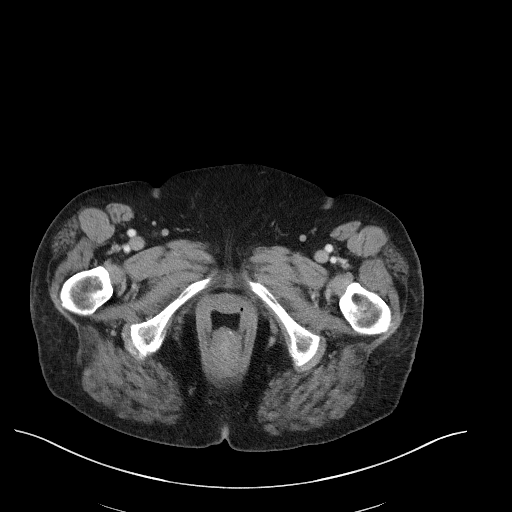
[im 12/133  bone]
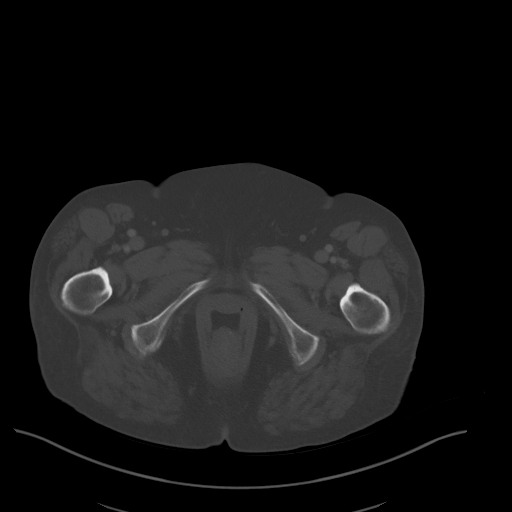
[im 23/133  mediastinal]
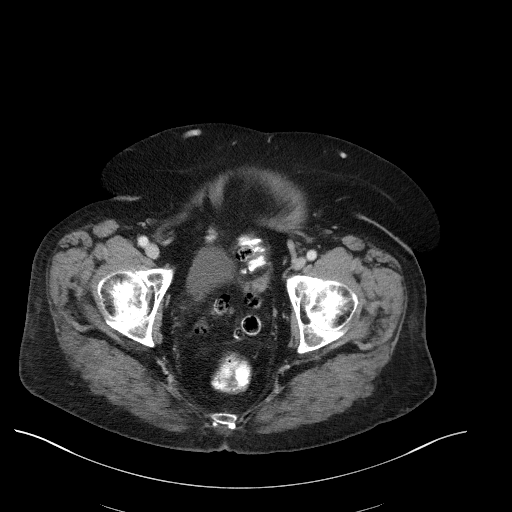
[im 34/133  mediastinal]
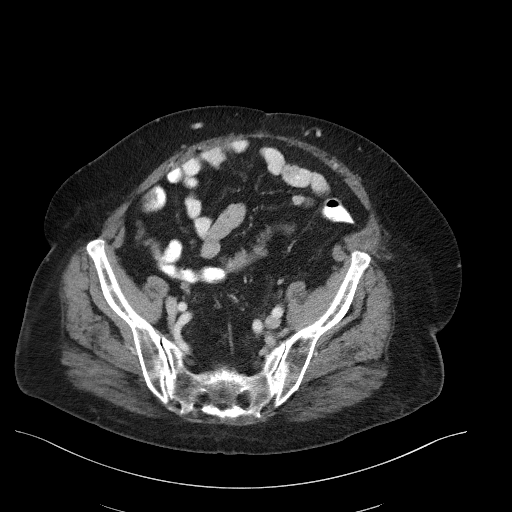
[im 45/133  mediastinal]
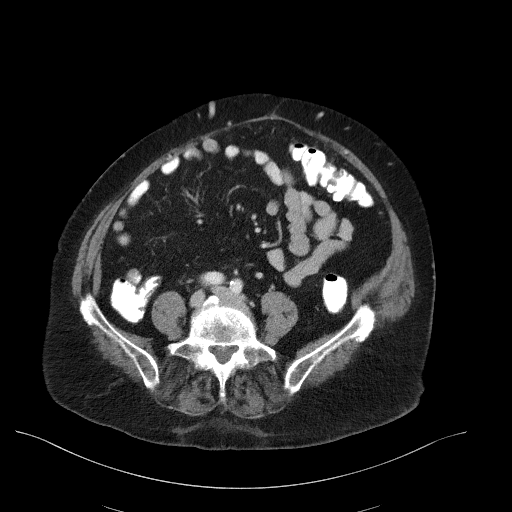
[im 56/133  mediastinal]
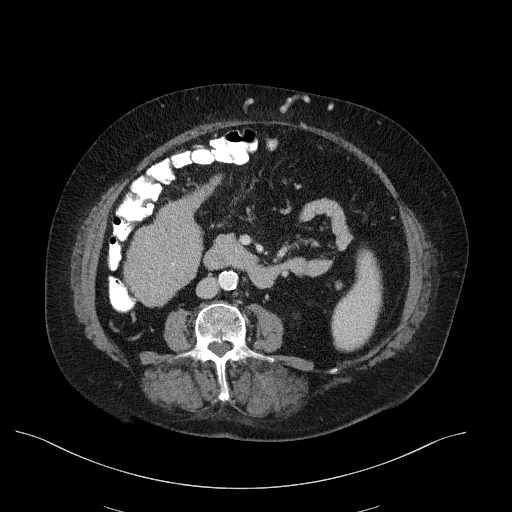
[im 67/133  mediastinal]
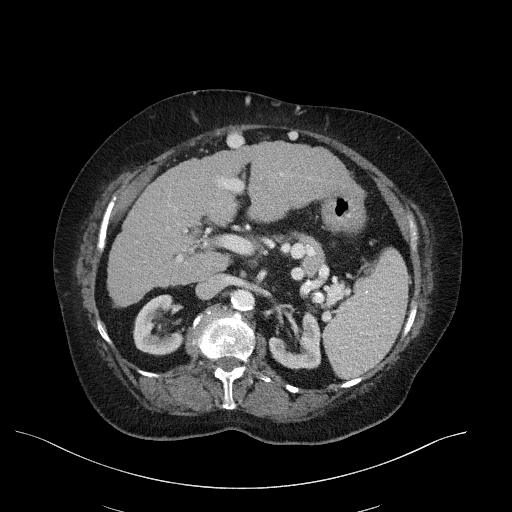
[im 78/133  mediastinal]
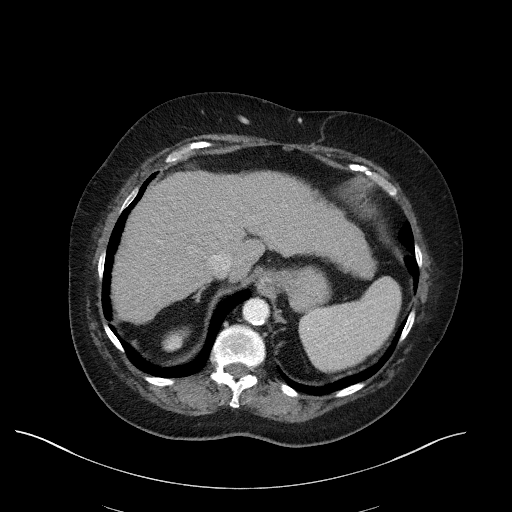
[im 89/133  mediastinal]
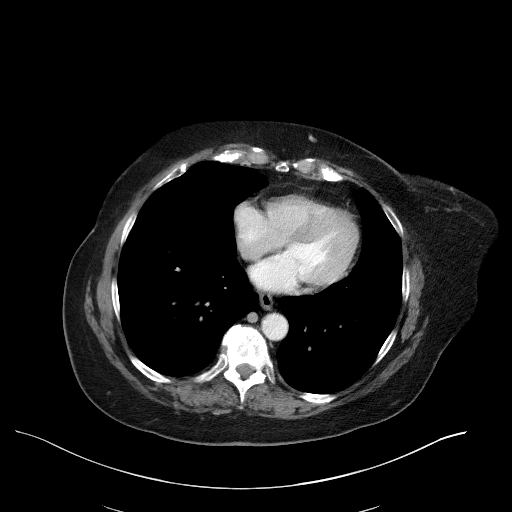
[im 100/133  mediastinal]
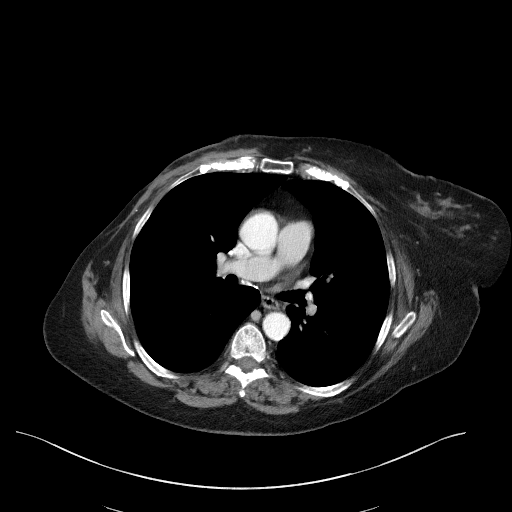
[im 100/133  bone]
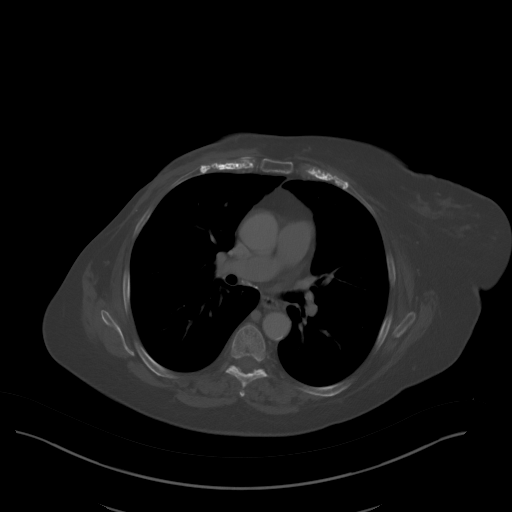
[im 111/133  mediastinal]
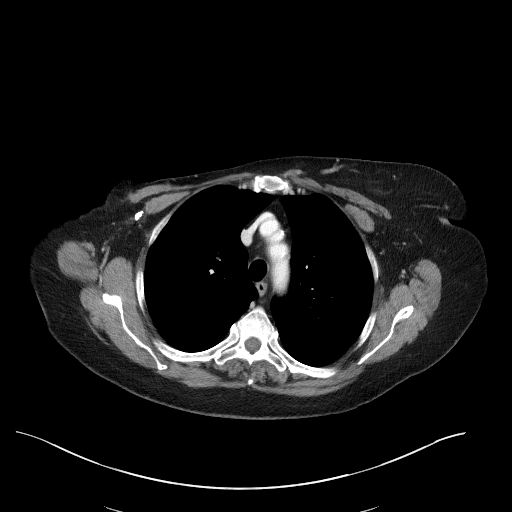
[im 122/133  mediastinal]
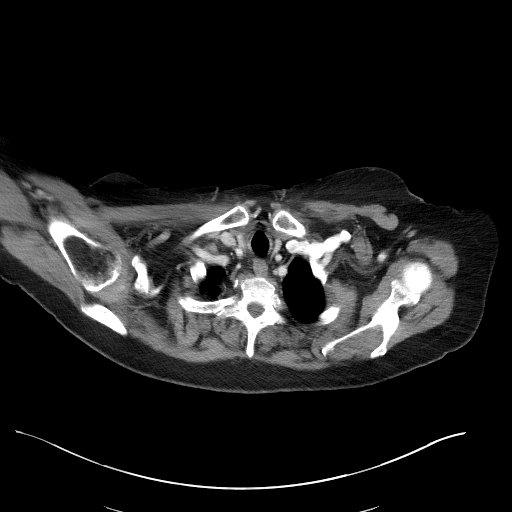

[Series 5: coronals · coronal · 0.82mm/px · 3 of 161 slices shown]
[im 33/161  mediastinal]
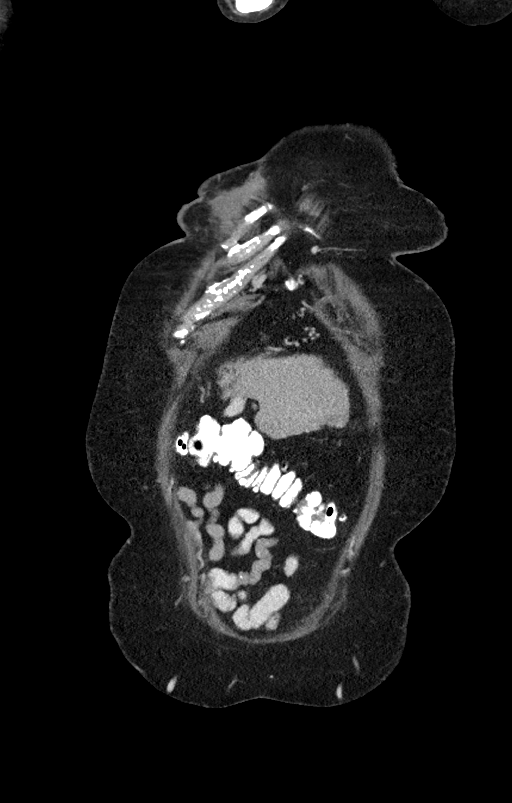
[im 65/161  mediastinal]
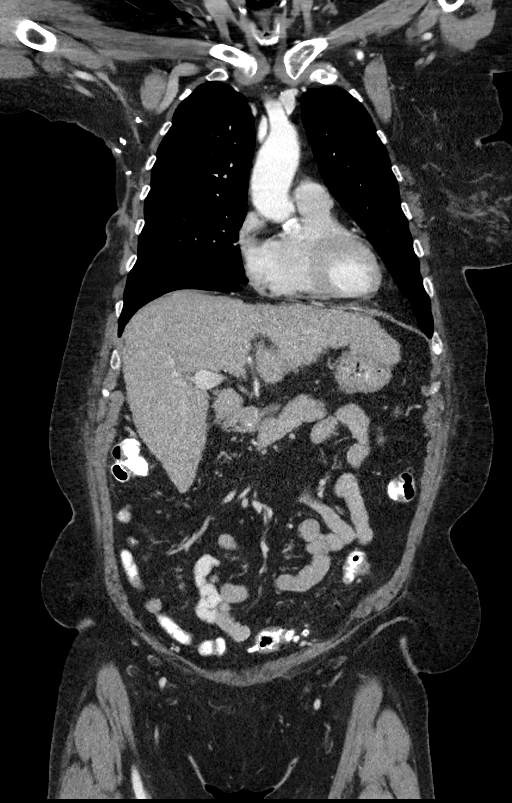
[im 97/161  mediastinal]
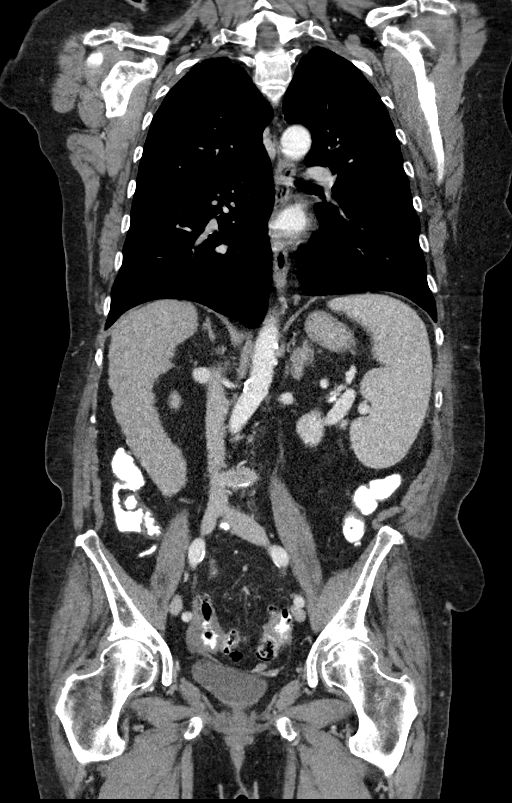

[14 of 36 positions shown; findings below may reference images not displayed]

FINDINGS: CT CHEST FINDINGS

Cardiovascular: Atherosclerotic calcification of the aorta, aortic
valve and coronary arteries. Heart size normal. No pericardial
effusion.

Mediastinum/Nodes: Calcified subcarinal and right hilar lymph nodes.
No pathologically enlarged mediastinal, hilar, axillary or internal
mammary lymph nodes. Surgical clips in the right axilla. Esophagus
is grossly unremarkable.

Lungs/Pleura: Smudgy 4 mm nodule along the minor fissure (7/82),
likely a subpleural lymph node. Calcified right lower lobe
granuloma. Linear scarring in the anterolateral left lower lobe. No
suspicious pulmonary nodules. No pleural fluid. Airway is
unremarkable.

Musculoskeletal: Right mastectomy. Degenerative changes in the
spine. No worrisome lytic or sclerotic lesions

CT ABDOMEN PELVIS FINDINGS

Hepatobiliary: Liver margin is markedly irregular. Liver is slightly
enlarged, 18.2 cm. No focal lesion. Cholecystectomy. No biliary
ductal dilatation.

Pancreas: Negative.

Spleen: Enlarged, 14.4 cm.

Adrenals/Urinary Tract: Adrenal glands are unremarkable. Probable
small renal sinus cysts bilaterally. Subcentimeter low-attenuation
lesion in the medial interpolar left kidney, too small to
characterize but statistically, a cyst is likely. Ureters are
decompressed. Bladder is low in volume.

Stomach/Bowel: Stomach, small bowel, appendix and colon are
unremarkable.

Vascular/Lymphatic: Atherosclerotic calcification of the aorta
without aneurysm. Recanalized paraumbilical vein. Gastroesophageal
varices. 13 mm fusiform aneurysm of the splenic artery (2/69). No
pathologically enlarged lymph nodes.

Reproductive: Hysterectomy.  No adnexal mass.

Other: No free fluid. Mesenteries and peritoneum are otherwise
unremarkable.

Musculoskeletal: Degenerative changes in the spine. No worrisome
lytic or sclerotic lesions. Degenerative changes in the hips.
IMPRESSION: 1. No evidence of recurrent or metastatic disease.
2. Cirrhosis with portal hypertension.
3. Aortic atherosclerosis ([GY]-[GY]). Coronary artery
calcification.
4. 13 mm fusiform aneurysm of the splenic artery.

## 2020-02-28 MED ORDER — IOHEXOL 300 MG/ML  SOLN
100.0000 mL | Freq: Once | INTRAMUSCULAR | Status: AC | PRN
  Administered 2020-02-28: 100 mL via INTRAVENOUS

## 2020-02-29 ENCOUNTER — Telehealth: Payer: Self-pay | Admitting: *Deleted

## 2020-02-29 NOTE — Telephone Encounter (Signed)
TCT patient regarding recent CT scan. No answer but was able to leave vm message for pt to return call @ her convenience @ [phone]. Pt scan shows no evidence of cancer spread to her bones or other organs. She is to continue her Letrozole and we will se back in February 2022

## 2020-02-29 NOTE — Telephone Encounter (Signed)
-----   Message from Orson Slick, MD sent at 02/29/2020 10:10 AM EST ----- Please let Catherine Decker know that her bloodwork is stable and that her CT scan shows no evidence of breast cancer spread to bones/organs. We  will continue on letrozole and plan to see her back in Feb 2022.   ----- Message ----- From: Buel Ream, Rad Results In Sent: 02/29/2020   8:53 AM EST To: Orson Slick, MD

## 2020-03-19 DIAGNOSIS — M62830 Muscle spasm of back: Secondary | ICD-10-CM | POA: Diagnosis not present

## 2020-03-19 DIAGNOSIS — M5412 Radiculopathy, cervical region: Secondary | ICD-10-CM | POA: Diagnosis not present

## 2020-03-19 DIAGNOSIS — M5417 Radiculopathy, lumbosacral region: Secondary | ICD-10-CM | POA: Diagnosis not present

## 2020-03-19 DIAGNOSIS — M7912 Myalgia of auxiliary muscles, head and neck: Secondary | ICD-10-CM | POA: Diagnosis not present

## 2020-03-19 DIAGNOSIS — M546 Pain in thoracic spine: Secondary | ICD-10-CM | POA: Diagnosis not present

## 2020-03-19 DIAGNOSIS — M5413 Radiculopathy, cervicothoracic region: Secondary | ICD-10-CM | POA: Diagnosis not present

## 2020-04-02 ENCOUNTER — Other Ambulatory Visit: Payer: Self-pay | Admitting: *Deleted

## 2020-04-02 DIAGNOSIS — M5413 Radiculopathy, cervicothoracic region: Secondary | ICD-10-CM | POA: Diagnosis not present

## 2020-04-02 DIAGNOSIS — M546 Pain in thoracic spine: Secondary | ICD-10-CM | POA: Diagnosis not present

## 2020-04-02 DIAGNOSIS — M5417 Radiculopathy, lumbosacral region: Secondary | ICD-10-CM | POA: Diagnosis not present

## 2020-04-02 DIAGNOSIS — M62830 Muscle spasm of back: Secondary | ICD-10-CM | POA: Diagnosis not present

## 2020-04-02 DIAGNOSIS — M5412 Radiculopathy, cervical region: Secondary | ICD-10-CM | POA: Diagnosis not present

## 2020-04-02 DIAGNOSIS — M7912 Myalgia of auxiliary muscles, head and neck: Secondary | ICD-10-CM | POA: Diagnosis not present

## 2020-04-02 MED ORDER — LETROZOLE 2.5 MG PO TABS: 2.5000 mg | ORAL_TABLET | Freq: Every day | ORAL | 3 refills | Status: DC

## 2020-04-04 ENCOUNTER — Telehealth: Payer: Self-pay | Admitting: Nutrition

## 2020-04-04 NOTE — Telephone Encounter (Signed)
Pt. Called back saying that she can not financially afford to have this done at this time.  I informed her that her medicare will pay for this procedure, but she refused and said she will call back at a later time.

## 2020-04-04 NOTE — Telephone Encounter (Signed)
LVM to schedule reclast infusion for osteoporosis

## 2020-04-09 DIAGNOSIS — M7912 Myalgia of auxiliary muscles, head and neck: Secondary | ICD-10-CM | POA: Diagnosis not present

## 2020-04-09 DIAGNOSIS — M5417 Radiculopathy, lumbosacral region: Secondary | ICD-10-CM | POA: Diagnosis not present

## 2020-04-09 DIAGNOSIS — M5413 Radiculopathy, cervicothoracic region: Secondary | ICD-10-CM | POA: Diagnosis not present

## 2020-04-09 DIAGNOSIS — M5412 Radiculopathy, cervical region: Secondary | ICD-10-CM | POA: Diagnosis not present

## 2020-04-09 DIAGNOSIS — M62830 Muscle spasm of back: Secondary | ICD-10-CM | POA: Diagnosis not present

## 2020-04-09 DIAGNOSIS — M546 Pain in thoracic spine: Secondary | ICD-10-CM | POA: Diagnosis not present

## 2020-04-10 ENCOUNTER — Other Ambulatory Visit: Payer: Self-pay | Admitting: *Deleted

## 2020-04-10 MED ORDER — LETROZOLE 2.5 MG PO TABS: 2.5000 mg | ORAL_TABLET | Freq: Every day | ORAL | 3 refills | Status: DC

## 2020-04-17 DIAGNOSIS — M7912 Myalgia of auxiliary muscles, head and neck: Secondary | ICD-10-CM | POA: Diagnosis not present

## 2020-04-17 DIAGNOSIS — M546 Pain in thoracic spine: Secondary | ICD-10-CM | POA: Diagnosis not present

## 2020-04-17 DIAGNOSIS — M62830 Muscle spasm of back: Secondary | ICD-10-CM | POA: Diagnosis not present

## 2020-04-17 DIAGNOSIS — M5417 Radiculopathy, lumbosacral region: Secondary | ICD-10-CM | POA: Diagnosis not present

## 2020-04-20 ENCOUNTER — Inpatient Hospital Stay: Payer: Medicare HMO

## 2020-04-20 ENCOUNTER — Inpatient Hospital Stay: Payer: Medicare HMO | Attending: Hematology and Oncology | Admitting: Hematology and Oncology

## 2020-04-20 ENCOUNTER — Other Ambulatory Visit: Payer: Self-pay | Admitting: Hematology and Oncology

## 2020-04-20 ENCOUNTER — Other Ambulatory Visit: Payer: Self-pay

## 2020-04-20 ENCOUNTER — Encounter: Payer: Self-pay | Admitting: Hematology and Oncology

## 2020-04-20 VITALS — BP 139/59 | HR 70 | Temp 97.9°F | Resp 18 | Ht 62.0 in | Wt 178.6 lb

## 2020-04-20 DIAGNOSIS — Z7984 Long term (current) use of oral hypoglycemic drugs: Secondary | ICD-10-CM | POA: Insufficient documentation

## 2020-04-20 DIAGNOSIS — C50911 Malignant neoplasm of unspecified site of right female breast: Secondary | ICD-10-CM

## 2020-04-20 DIAGNOSIS — Z79899 Other long term (current) drug therapy: Secondary | ICD-10-CM | POA: Diagnosis not present

## 2020-04-20 DIAGNOSIS — I10 Essential (primary) hypertension: Secondary | ICD-10-CM | POA: Diagnosis not present

## 2020-04-20 DIAGNOSIS — Z79811 Long term (current) use of aromatase inhibitors: Secondary | ICD-10-CM | POA: Insufficient documentation

## 2020-04-20 DIAGNOSIS — D72819 Decreased white blood cell count, unspecified: Secondary | ICD-10-CM | POA: Insufficient documentation

## 2020-04-20 DIAGNOSIS — D696 Thrombocytopenia, unspecified: Secondary | ICD-10-CM | POA: Insufficient documentation

## 2020-04-20 DIAGNOSIS — Z170 Estrogen receptor positive status [ER+]: Secondary | ICD-10-CM | POA: Insufficient documentation

## 2020-04-20 DIAGNOSIS — E039 Hypothyroidism, unspecified: Secondary | ICD-10-CM | POA: Diagnosis not present

## 2020-04-20 DIAGNOSIS — K7460 Unspecified cirrhosis of liver: Secondary | ICD-10-CM | POA: Diagnosis not present

## 2020-04-20 DIAGNOSIS — Z923 Personal history of irradiation: Secondary | ICD-10-CM | POA: Diagnosis not present

## 2020-04-20 DIAGNOSIS — E119 Type 2 diabetes mellitus without complications: Secondary | ICD-10-CM | POA: Diagnosis not present

## 2020-04-20 LAB — CMP (CANCER CENTER ONLY)
ALT: 18 U/L (ref 0–44)
AST: 39 U/L (ref 15–41)
Albumin: 3.6 g/dL (ref 3.5–5.0)
Alkaline Phosphatase: 46 U/L (ref 38–126)
Anion gap: 8 (ref 5–15)
BUN: 24 mg/dL — ABNORMAL HIGH (ref 8–23)
CO2: 23 mmol/L (ref 22–32)
Calcium: 9.3 mg/dL (ref 8.9–10.3)
Chloride: 110 mmol/L (ref 98–111)
Creatinine: 1.2 mg/dL — ABNORMAL HIGH (ref 0.44–1.00)
GFR, Estimated: 46 mL/min — ABNORMAL LOW (ref >60)
Glucose, Bld: 124 mg/dL — ABNORMAL HIGH (ref 70–99)
Potassium: 4.7 mmol/L (ref 3.5–5.1)
Sodium: 141 mmol/L (ref 135–145)
Total Bilirubin: 0.5 mg/dL (ref 0.3–1.2)
Total Protein: 6.9 g/dL (ref 6.5–8.1)

## 2020-04-20 LAB — CBC WITH DIFFERENTIAL (CANCER CENTER ONLY)
Abs Immature Granulocytes: 0.01 10*3/uL (ref 0.00–0.07)
Basophils Absolute: 0 10*3/uL (ref 0.0–0.1)
Basophils Relative: 1 %
Eosinophils Absolute: 0.1 10*3/uL (ref 0.0–0.5)
Eosinophils Relative: 4 %
HCT: 36.8 % (ref 36.0–46.0)
Hemoglobin: 11.9 g/dL — ABNORMAL LOW (ref 12.0–15.0)
Immature Granulocytes: 0 %
Lymphocytes Relative: 23 %
Lymphs Abs: 0.6 10*3/uL — ABNORMAL LOW (ref 0.7–4.0)
MCH: 31.5 pg (ref 26.0–34.0)
MCHC: 32.3 g/dL (ref 30.0–36.0)
MCV: 97.4 fL (ref 80.0–100.0)
Monocytes Absolute: 0.2 10*3/uL (ref 0.1–1.0)
Monocytes Relative: 8 %
Neutro Abs: 1.6 10*3/uL — ABNORMAL LOW (ref 1.7–7.7)
Neutrophils Relative %: 64 %
Platelet Count: 83 10*3/uL — ABNORMAL LOW (ref 150–400)
RBC: 3.78 MIL/uL — ABNORMAL LOW (ref 3.87–5.11)
RDW: 13.4 % (ref 11.5–15.5)
WBC Count: 2.5 10*3/uL — ABNORMAL LOW (ref 4.0–10.5)
nRBC: 0 % (ref 0.0–0.2)

## 2020-04-20 NOTE — Progress Notes (Signed)
Catherine Decker [phone]   [phone]  PROGRESS NOTE  Patient Care Team: Antony Contras, MD as PCP - General (Family Medicine)  Hematological/Oncological History #Leukopenia, Lymphopenia #Thrombocytopenia 1) 05/15/2010: WBC 4.5, Hgb 14.1, Plt 124, MCV 88.5. ANC 2.9, Lymph 1.1. Earliest CBC on record.  2) 01/25/2019: WBC 3.0, Hgb 12.8, Plt 101, MCV 93.3. ANC 1.8, Lymph 0.8, Mono 0.2 3) 03/15/2019: WBC 2.5, Hgb 12.9, Plt 96. MCV 93.6. ANC 1.5, Lymph 0.7, Mono 0.2 4) 03/31/2019: establish care with Dr. Lorenso Courier    #Recurrent Breast Cancer  1) underwent lumpectomy and radiation therapy in 2000 on right breast cancer  2) declined chemotherapy and only received 6 months of tamoxifen therapy 3) 06/16/2019: Mammogram showed a new 1.4 cm mass has been noted in the right breast. Biopsy performed showed 90% ER, 20% PR, and HER2 negative. 4) 07/01/2019: clinic visit scheduled with Breast Surgery  5) 09/07/2019: right simple mastectomy performed with sentinel lymph node mapping. Findings showed an invasive lobular carcinoma 1.4 cm with no evidence of lymph node involvement.   Interval History:  Catherine Decker 80 y.o. female with medical history significant for leukopenia/thrombocytopenia who presents for a follow up visit. The patient's last visit was on 02/07/2020.  In the interim since her last visit she has been compliant with letrozole therapy and also had a CT chest abdomen pelvis which showed no evidence of metastatic disease.  Catherine Decker orts that she has been having some issues with cramping in her hands and cramping in her legs.  She is not sure what the etiology of this cramping is, but it does not appear to severely with her day-to-day life.  She has been compliant with her letrozole 2.5 mg daily.  She reports that her energy is quite good.  She has been seeing a chiropractor in order to help with her back and hip pain.  Otherwise she has had no major changes in her  health since our prior visit.  She currently denies having any issues with fevers, chills, sweats, nausea, vomiting or diarrhea.  A full 10 point ROS is the blood.  MEDICAL HISTORY:  Past Medical History:  Diagnosis Date  . Anemia   . Arthritis of back   . Arthritis of hand    per patient right hand  . Autoimmune deficiency syndrome ([place])    per patient  . Breast cancer ([place])   . Cancer ([place])    right breast   . Cirrhosis ([place])   . Cirrhosis of liver ([place])   . Complication of anesthesia    per patient, slow to awake  . Diabetes mellitus   . GERD (gastroesophageal reflux disease)    Also, Hiatal Hernia   . H/O migraine   . 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 Hyperlipidemia   . Hypertension   . Hypothyroidism   . Splenic artery aneurysm [facility])     SURGICAL HISTORY: Past Surgical History:  Procedure Laterality Date  . APPENDECTOMY    . BREAST LUMPECTOMY     Right breast lumpectomy  . CATARACT EXTRACTION W/ INTRAOCULAR LENS  IMPLANT, BILATERAL  2018  . CHOLECYSTECTOMY  04/2010  . MASTECTOMY W/ SENTINEL NODE BIOPSY Right 09/07/2019   Procedure: RIGHT SIMPLE MASTECTOMY WITH SENTINEL LYMPH NODE MAPPING;  Surgeon: Erroll Luna, MD;  Location: [facility];  Service: General;  Laterality: Right;  PEC BLOCK  . TONSILLECTOMY    . [place]  ALLERGIES:  is allergic to demerol, meperidine, and meperidine hcl.  MEDICATIONS:  Current Outpatient Medications  Medication Sig Dispense Refill  . calcium carbonate (CALCIUM 600) 600 MG TABS tablet Take 600 mg by mouth daily with breakfast.    . cholecalciferol (VITAMIN D3) 25 MCG (1000 UNIT) tablet Take 1,000 Units by mouth daily.    Marland Kitchen CINNAMON PO Take 500 mg by mouth daily.     . Coenzyme Q10 (COQ10) 100 MG CAPS     . EUTHYROX 112 MCG tablet Take 112 mcg by mouth at bedtime.    . fenofibrate 160 MG tablet Take 160 mg by mouth daily.    . folic acid (FOLVITE) 1 MG tablet Take 1 tablet (1 mg  total) by mouth daily. 90 tablet 3  . Ginkgo Biloba 60 MG CAPS Take 60 mg by mouth daily.    . Glucose Blood (ONETOUCH ULTRA BLUE VI) daily.    Marland Kitchen letrozole (FEMARA) 2.5 MG tablet Take 1 tablet (2.5 mg total) by mouth daily. 90 tablet 3  . losartan (COZAAR) 25 MG tablet Take 12.5 mg by mouth daily.     . metFORMIN (GLUCOPHAGE) 500 MG tablet Take 500 mg by mouth daily with breakfast.    . Omega-3 Fatty Acids (FISH OIL) 600 MG CAPS Take 600 mg by mouth daily.    Marland Kitchen omeprazole (PRILOSEC) 20 MG capsule Take 20 mg by mouth daily as needed (acid reflux).     . pioglitazone (ACTOS) 15 MG tablet Take 15 mg by mouth daily.    . simvastatin (ZOCOR) 20 MG tablet Take 20 mg by mouth every evening.    . vitamin B-12 (CYANOCOBALAMIN) 1000 MCG tablet Take 1,000 mcg by mouth daily.     No current facility-administered medications for this visit.    REVIEW OF SYSTEMS:   Constitutional: ( - ) fevers, ( - )  chills , ( - ) night sweats Eyes: ( - ) blurriness of vision, ( - ) double vision, ( - ) watery eyes Ears, nose, mouth, throat, and face: ( - ) mucositis, ( - ) sore throat Respiratory: ( - ) cough, ( - ) dyspnea, ( - ) wheezes Cardiovascular: ( - ) palpitation, ( - ) chest discomfort, ( - ) lower extremity swelling Gastrointestinal:  ( - ) nausea, ( - ) heartburn, ( - ) change in bowel habits Skin: ( - ) abnormal skin rashes Lymphatics: ( - ) new lymphadenopathy, ( - ) easy bruising Neurological: ( - ) numbness, ( - ) tingling, ( - ) new weaknesses Behavioral/Psych: ( - ) mood change, ( - ) new changes  All other systems were reviewed with the patient and are negative.  PHYSICAL EXAMINATION: ECOG PERFORMANCE STATUS: 1 - Symptomatic but completely ambulatory  Vitals:   04/20/20 0815  BP: (!) 139/59  Pulse: 70  Resp: 18  Temp: 97.9 F (36.6 C)  SpO2: 99%   Filed Weights   04/20/20 0815  Weight: 178 lb 9.6 oz (81 kg)    GENERAL: well appearing elderly Caucasian female in NAD  SKIN: skin  color, texture, turgor are normal, no rashes or significant lesions EYES: conjunctiva are pink and non-injected, sclera clear BREAST: s/p simple mastectomy. No evidence of infection, well healing incision. Drains in place.  LUNGS: clear to auscultation and percussion with normal breathing effort HEART: regular rate & rhythm and no murmurs and no lower extremity edema Musculoskeletal: no cyanosis of digits and no clubbing  PSYCH: alert & oriented x  3, fluent speech NEURO: no focal motor/sensory deficits  LABORATORY DATA:  I have reviewed the data as listed CBC Latest Ref Rng & Units 04/20/2020 02/28/2020 01/20/2020  WBC 4.0 - 10.5 K/uL 2.5(L) 3.8(L) 2.6(L)  Hemoglobin 12.0 - 15.0 g/dL 11.9(L) 12.4 12.2  Hematocrit 36.0 - 46.0 % 36.8 37.4 36.5  Platelets 150 - 400 K/uL 83(L) 96(L) 81(L)    CMP Latest Ref Rng & Units 04/20/2020 02/28/2020 01/20/2020  Glucose 70 - 99 mg/dL 124(H) 108(H) 128(H)  BUN 8 - 23 mg/dL 24(H) 18 20  Creatinine 0.44 - 1.00 mg/dL 1.20(H) 1.17(H) 1.25(H)  Sodium 135 - 145 mmol/L 141 139 139  Potassium 3.5 - 5.1 mmol/L 4.7 4.3 4.8  Chloride 98 - 111 mmol/L 110 108 109  CO2 22 - 32 mmol/L 23 23 22   Calcium 8.9 - 10.3 mg/dL 9.3 9.5 9.0  Total Protein 6.5 - 8.1 g/dL 6.9 7.2 6.7  Total Bilirubin 0.3 - 1.2 mg/dL 0.5 1.0 0.7  Alkaline Phos 38 - 126 U/L 46 48 44  AST 15 - 41 U/L 39 34 33  ALT 0 - 44 U/L 18 17 14      RADIOGRAPHIC STUDIES:  No results found.  ASSESSMENT & PLAN Catherine Decker 80 y.o. female with medical history significant for leukopenia/thrombocytopenia who presents for a follow up visit.  After review the labs discussion with the patient her findings are most consistent with leukopenia and thrombocytopenia due to chronic liver disease.  The patient has not responded to vitamin B12 or folate therapy, but we did wish to assure that her levels of these are replete.  The patient is fortunately already established with the [facility] [facility] and therefore  these cytopenias could further be followed in their clinic with re-referral in the event that they were to worsen.  In terms of her breast cancer, she was found to have a new 1.5 cm lesion within the right breast on 06/16/2019.  She has already undergone biopsy of this lesion which shows an ER positive, PR positive, HER-2 negative breast cancer.  Surgical resection was performed on 09/07/2019 which revealed invasive lobular carcinoma.  Started on letrozole therapy on 10/11/2019.  On exam today Catherine Decker is tolerating her letrozole therapy quite well.  She had a CT chest abdomen pelvis on 02/28/2020 which showed no evidence of metastatic disease.   #Bicytopenia: Leukopenia and Thrombocytopenia, stable --most likely etiology at this time is the patient's cirrhosis of the liver.  --patient has been on vitamin b12 and folate in the interim with no remarkable change in her blood counts. --continue to monitor  #Cirrhosis of the Liver --unclear etiology, thought be 2/2 to childhood illness with hepatitis --following with hepatology for evaluation and [facility] screening --continue to monitor   #History of Breast Cancer #Recurrence of Breast Cancer: ER+/PR+/HER2- --underwent lumpectomy and radiation therapy in 2000 --declined chemotherapy and only received 6 months of tamoxifen therapy --prior mammogram performed in 2018, new imaging performed 06/2019 --A 1.4 cm mass has been noted in the right breast. Biopsy performed showed 90% ER, 20% PR, and HER2 negative.  --surgical resection of this mass was performed on 09/07/2019 with findings showing invasive lobular carcinoma Nottingham Grade 2 of 3, 2.2 cm. Found to have a deep margin.  No carcinoma noted in lymph nodes.  -- Based on the CALOR Trial ( J Clin Oncol. [phone]) would recommend adjuvant aromatase ihibitor therapy.  --baseline CT C/A/P showed no evidence of metastatic disease. --RTC in 3 months for continued  monitoring  No orders of the  defined types were placed in this encounter.   All questions were answered. The patient knows to call the clinic with any problems, questions or concerns.  A total of more than 30 minutes were spent on this encounter and over half of that time was spent on counseling and coordination of care as outlined above.   Ledell Peoples, MD Department of Hematology/Oncology [place] at [facility] Phone: [phone] Pager: [phone] Email: [email or web address]  04/20/2020 9:03 AM   Literature Support:   Donley Redder Vernie Murders, et al. Efficacy of chemotherapy for ER-negative and ER-positive isolated locoregional recurrence of breast cancer: final analysis of the CALOR trial. J Clin Oncol. 6578;46:9629-5.  --The final analysis of CALOR confirms that chemtotherapy benefits patients with resected ER-negative ILRR and does not support the use of chemotherapy for ER-positive ILRR.

## 2020-04-23 ENCOUNTER — Telehealth: Payer: Self-pay | Admitting: Hematology and Oncology

## 2020-04-23 NOTE — Telephone Encounter (Signed)
Scheduled appts per 2/11 los. Left voicmail with appt date and time.

## 2020-04-24 DIAGNOSIS — M5417 Radiculopathy, lumbosacral region: Secondary | ICD-10-CM | POA: Diagnosis not present

## 2020-04-24 DIAGNOSIS — M7912 Myalgia of auxiliary muscles, head and neck: Secondary | ICD-10-CM | POA: Diagnosis not present

## 2020-04-24 DIAGNOSIS — M546 Pain in thoracic spine: Secondary | ICD-10-CM | POA: Diagnosis not present

## 2020-04-24 DIAGNOSIS — M62830 Muscle spasm of back: Secondary | ICD-10-CM | POA: Diagnosis not present

## 2020-05-01 DIAGNOSIS — M7912 Myalgia of auxiliary muscles, head and neck: Secondary | ICD-10-CM | POA: Diagnosis not present

## 2020-05-01 DIAGNOSIS — M62830 Muscle spasm of back: Secondary | ICD-10-CM | POA: Diagnosis not present

## 2020-05-01 DIAGNOSIS — M546 Pain in thoracic spine: Secondary | ICD-10-CM | POA: Diagnosis not present

## 2020-05-01 DIAGNOSIS — M5417 Radiculopathy, lumbosacral region: Secondary | ICD-10-CM | POA: Diagnosis not present

## 2020-05-08 DIAGNOSIS — H43822 Vitreomacular adhesion, left eye: Secondary | ICD-10-CM | POA: Diagnosis not present

## 2020-05-08 DIAGNOSIS — H04123 Dry eye syndrome of bilateral lacrimal glands: Secondary | ICD-10-CM | POA: Diagnosis not present

## 2020-05-22 DIAGNOSIS — H04123 Dry eye syndrome of bilateral lacrimal glands: Secondary | ICD-10-CM | POA: Diagnosis not present

## 2020-05-28 ENCOUNTER — Ambulatory Visit: Payer: Self-pay | Admitting: Surgery

## 2020-05-28 DIAGNOSIS — C50911 Malignant neoplasm of unspecified site of right female breast: Secondary | ICD-10-CM | POA: Diagnosis not present

## 2020-05-28 NOTE — H&P (View-Only) (Signed)
Catherine Decker Appointment: 05/28/2020 10:50 AM Location: [facility] Patient #: [id number] DOB: 03-14-1940 [name] / Race: White Female  History of Present Illness [name] A. Alexanderjames Berg MD; 05/28/2020 12:30 PM) Patient words: Patient returns for recheck of her right mastectomy wound. This was 9 months ago. She had excessive skin at the lateral pillar of her incision and returns for recheck. He is quite bothersome to her. She cannot be fitted for prosthetic at this time due to this area. He gets caught on her clothing and skin and causes mild to moderate discomfort.  The patient is a 80 year old female.   Allergies [name], CNA; 05/28/2020 11:05 AM) Demerol *ANALGESICS - OPIOID* Allergies Reconciled  Medication History [name], CNA; 8/46/9629 52:84 AM) Folic Acid (1MG Tablet, Oral) Active. Losartan Potassium (25MG Tablet, Oral) Active. Levothyroxine Sodium (137MCG Tablet, Oral) Active. Simvastatin (20MG Tablet, Oral) Active. Fish Oil (Oral) Specific strength unknown - Active. Omeprazole (40MG Capsule DR, Oral) Active. metFORMIN HCl (500MG Tablet, Oral) Active. Medications Reconciled     Physical Exam (Ian Castagna A. Meghen Akopyan MD; 05/28/2020 12:30 PM)  Head and Neck Head-normocephalic, atraumatic with no lesions or palpable masses.  Breast Note: Right breast is absent. Mastectomy scar is well-healed except for the lateral border of the large skin tag noted. No masses noted. Left breast is otherwise normal.  Neurologic Neurologic evaluation reveals -alert and oriented x 3 with no impairment of recent or remote memory. Mental Status-Normal.  Lymphatic Axillary  General Axillary Region: Bilateral - Description - Normal. Tenderness - Non Tender.    Assessment & Plan (Shaheem Pichon A. Avrie Kedzierski MD; 05/28/2020 12:32 PM)  BREAST CANCER, RIGHT (C50.911) Impression: PT NEEDS SCAR REVISION SO SHE CAN BE FITTED FOR  PROSTHETIC   Discussed the pros and cons of scar excision/revision to her. This area is quite bothersome to her and she would like to proceed. This will also enable her to be fitted for a prosthetic which will sit better in the area. Risk of bleeding, infection, further cosmetic deformity, pain, injury to neighboring structures, blood clots, stroke, myocardial infarction, DVT, death etc. discussed with her today with these being the most, and potential complications but low risk.  Current Plans Pt Education - [facility] Free Text Education/Instructions: discussed with patient and provided information.

## 2020-05-28 NOTE — H&P (Signed)
Catherine Decker Appointment: 05/28/2020 10:50 AM Location: [facility] Patient #: [id number] DOB: 20-Apr-1940 Widowed / Language: Catherine Decker / Race: White Female  History of Present Illness [name] A. Kaycie Pegues MD; 05/28/2020 12:30 PM) Patient words: Patient returns for recheck of her right mastectomy wound. This was 9 months ago. She had excessive skin at the lateral pillar of her incision and returns for recheck. He is quite bothersome to her. She cannot be fitted for prosthetic at this time due to this area. He gets caught on her clothing and skin and causes mild to moderate discomfort.  The patient is a 80 year old female.   Allergies [name], CNA; 05/28/2020 11:05 AM) Demerol *ANALGESICS - OPIOID* Allergies Reconciled  Medication History [name], CNA; 2/63/7858 85:02 AM) Folic Acid (1MG Tablet, Oral) Active. Losartan Potassium (25MG Tablet, Oral) Active. Levothyroxine Sodium (137MCG Tablet, Oral) Active. Simvastatin (20MG Tablet, Oral) Active. Fish Oil (Oral) Specific strength unknown - Active. Omeprazole (40MG Capsule DR, Oral) Active. metFORMIN HCl (500MG Tablet, Oral) Active. Medications Reconciled     Physical Exam (Aster Screws A. Avy Barlett MD; 05/28/2020 12:30 PM)  Head and Neck Head-normocephalic, atraumatic with no lesions or palpable masses.  Breast Note: Right breast is absent. Mastectomy scar is well-healed except for the lateral border of the large skin tag noted. No masses noted. Left breast is otherwise normal.  Neurologic Neurologic evaluation reveals -alert and oriented x 3 with no impairment of recent or remote memory. Mental Status-Normal.  Lymphatic Axillary  General Axillary Region: Bilateral - Description - Normal. Tenderness - Non Tender.    Assessment & Plan ([name]; 05/28/2020 12:32 PM)  BREAST CANCER, RIGHT (C50.911) Impression: PT NEEDS SCAR REVISION SO SHE CAN BE FITTED FOR  PROSTHETIC   Discussed the pros and cons of scar excision/revision to her. This area is quite bothersome to her and she would like to proceed. This will also enable her to be fitted for a prosthetic which will sit better in the area. Risk of bleeding, infection, further cosmetic deformity, pain, injury to neighboring structures, blood clots, stroke, myocardial infarction, DVT, death etc. discussed with her today with these being the most, and potential complications but low risk.  Current Plans Pt Education - [facility] Free Text Education/Instructions: discussed with patient and provided information.

## 2020-06-11 DIAGNOSIS — M62830 Muscle spasm of back: Secondary | ICD-10-CM | POA: Diagnosis not present

## 2020-06-11 DIAGNOSIS — M546 Pain in thoracic spine: Secondary | ICD-10-CM | POA: Diagnosis not present

## 2020-06-11 DIAGNOSIS — M7912 Myalgia of auxiliary muscles, head and neck: Secondary | ICD-10-CM | POA: Diagnosis not present

## 2020-06-11 DIAGNOSIS — M5417 Radiculopathy, lumbosacral region: Secondary | ICD-10-CM | POA: Diagnosis not present

## 2020-06-13 ENCOUNTER — Encounter (HOSPITAL_BASED_OUTPATIENT_CLINIC_OR_DEPARTMENT_OTHER): Payer: Self-pay | Admitting: Surgery

## 2020-06-13 ENCOUNTER — Other Ambulatory Visit: Payer: Self-pay

## 2020-06-18 ENCOUNTER — Other Ambulatory Visit (HOSPITAL_COMMUNITY)
Admission: RE | Admit: 2020-06-18 | Discharge: 2020-06-18 | Disposition: A | Discharge status: Home / Self Care | Payer: Medicare HMO | Source: Ambulatory Visit | Attending: Surgery | Admitting: Surgery

## 2020-06-18 DIAGNOSIS — Z01812 Encounter for preprocedural laboratory examination: Secondary | ICD-10-CM | POA: Diagnosis not present

## 2020-06-18 DIAGNOSIS — Z20822 Contact with and (suspected) exposure to covid-19: Secondary | ICD-10-CM | POA: Insufficient documentation

## 2020-06-18 LAB — SARS CORONAVIRUS 2 (TAT 6-24 HRS): SARS Coronavirus 2: NEGATIVE

## 2020-06-21 ENCOUNTER — Encounter (HOSPITAL_BASED_OUTPATIENT_CLINIC_OR_DEPARTMENT_OTHER)
Admission: RE | Disposition: A | Discharge status: Home / Self Care | Payer: Self-pay | Source: Home / Self Care | Attending: Surgery

## 2020-06-21 ENCOUNTER — Ambulatory Visit (HOSPITAL_BASED_OUTPATIENT_CLINIC_OR_DEPARTMENT_OTHER): Payer: Medicare HMO | Admitting: Certified Registered Nurse Anesthetist

## 2020-06-21 ENCOUNTER — Encounter (HOSPITAL_BASED_OUTPATIENT_CLINIC_OR_DEPARTMENT_OTHER): Payer: Self-pay | Admitting: Surgery

## 2020-06-21 ENCOUNTER — Ambulatory Visit (HOSPITAL_BASED_OUTPATIENT_CLINIC_OR_DEPARTMENT_OTHER)
Admission: RE | Admit: 2020-06-21 | Discharge: 2020-06-21 | Disposition: A | Discharge status: Home / Self Care | Payer: Medicare HMO | Attending: Surgery | Admitting: Surgery

## 2020-06-21 ENCOUNTER — Other Ambulatory Visit: Payer: Self-pay

## 2020-06-21 DIAGNOSIS — Z885 Allergy status to narcotic agent status: Secondary | ICD-10-CM | POA: Insufficient documentation

## 2020-06-21 DIAGNOSIS — Z79899 Other long term (current) drug therapy: Secondary | ICD-10-CM | POA: Diagnosis not present

## 2020-06-21 DIAGNOSIS — Z853 Personal history of malignant neoplasm of breast: Secondary | ICD-10-CM | POA: Insufficient documentation

## 2020-06-21 DIAGNOSIS — K219 Gastro-esophageal reflux disease without esophagitis: Secondary | ICD-10-CM | POA: Diagnosis not present

## 2020-06-21 DIAGNOSIS — L905 Scar conditions and fibrosis of skin: Secondary | ICD-10-CM | POA: Diagnosis not present

## 2020-06-21 DIAGNOSIS — L7682 Other postprocedural complications of skin and subcutaneous tissue: Secondary | ICD-10-CM | POA: Diagnosis not present

## 2020-06-21 DIAGNOSIS — L987 Excessive and redundant skin and subcutaneous tissue: Secondary | ICD-10-CM | POA: Diagnosis present

## 2020-06-21 DIAGNOSIS — I10 Essential (primary) hypertension: Secondary | ICD-10-CM | POA: Diagnosis not present

## 2020-06-21 DIAGNOSIS — Z7984 Long term (current) use of oral hypoglycemic drugs: Secondary | ICD-10-CM | POA: Diagnosis not present

## 2020-06-21 DIAGNOSIS — Q831 Accessory breast: Secondary | ICD-10-CM | POA: Diagnosis not present

## 2020-06-21 DIAGNOSIS — Z79890 Hormone replacement therapy (postmenopausal): Secondary | ICD-10-CM | POA: Insufficient documentation

## 2020-06-21 DIAGNOSIS — Z9011 Acquired absence of right breast and nipple: Secondary | ICD-10-CM | POA: Diagnosis not present

## 2020-06-21 DIAGNOSIS — E119 Type 2 diabetes mellitus without complications: Secondary | ICD-10-CM | POA: Diagnosis not present

## 2020-06-21 HISTORY — PX: SCAR REVISION: SHX5285

## 2020-06-21 LAB — GLUCOSE, CAPILLARY
Glucose-Capillary: 125 mg/dL — ABNORMAL HIGH (ref 70–99)
Glucose-Capillary: 118 mg/dL — ABNORMAL HIGH (ref 70–99)

## 2020-06-21 SURGERY — REVISION, SCAR
Anesthesia: General | Site: Breast | Laterality: Right

## 2020-06-21 MED ORDER — OXYCODONE HCL 5 MG PO TABS: 5.0000 mg | ORAL_TABLET | Freq: Four times a day (QID) | ORAL | 0 refills | Status: DC | PRN

## 2020-06-21 MED ORDER — ACETAMINOPHEN 500 MG PO TABS
ORAL_TABLET | ORAL | Status: AC
  Filled 2020-06-21: qty 2

## 2020-06-21 MED ORDER — LIDOCAINE 2% (20 MG/ML) 5 ML SYRINGE
INTRAMUSCULAR | Status: AC
  Filled 2020-06-21: qty 5

## 2020-06-21 MED ORDER — PROPOFOL 10 MG/ML IV BOLUS
INTRAVENOUS | Status: AC
  Filled 2020-06-21: qty 20

## 2020-06-21 MED ORDER — CELECOXIB 200 MG PO CAPS
ORAL_CAPSULE | ORAL | Status: AC
  Filled 2020-06-21: qty 1

## 2020-06-21 MED ORDER — FENTANYL CITRATE (PF) 100 MCG/2ML IJ SOLN
INTRAMUSCULAR | Status: AC
  Filled 2020-06-21: qty 2

## 2020-06-21 MED ORDER — OXYCODONE HCL 5 MG/5ML PO SOLN: 5.0000 mg | Freq: Once | ORAL | Status: DC | PRN

## 2020-06-21 MED ORDER — HYDROMORPHONE HCL 1 MG/ML IJ SOLN: 0.2500 mg | INTRAMUSCULAR | Status: DC | PRN

## 2020-06-21 MED ORDER — CEFAZOLIN SODIUM-DEXTROSE 2-4 GM/100ML-% IV SOLN
2.0000 g | INTRAVENOUS | Status: AC
  Administered 2020-06-21: 2 g via INTRAVENOUS

## 2020-06-21 MED ORDER — OXYCODONE HCL 5 MG PO TABS: 5.0000 mg | ORAL_TABLET | Freq: Once | ORAL | Status: DC | PRN

## 2020-06-21 MED ORDER — DEXAMETHASONE SODIUM PHOSPHATE 10 MG/ML IJ SOLN
INTRAMUSCULAR | Status: AC
  Filled 2020-06-21: qty 1

## 2020-06-21 MED ORDER — FENTANYL CITRATE (PF) 100 MCG/2ML IJ SOLN
INTRAMUSCULAR | Status: DC | PRN
  Administered 2020-06-21 (×2): 25 ug via INTRAVENOUS

## 2020-06-21 MED ORDER — SODIUM CHLORIDE 0.9 % IV SOLN
INTRAVENOUS | Status: DC | PRN
  Administered 2020-06-21: 500 mL

## 2020-06-21 MED ORDER — ACETAMINOPHEN 500 MG PO TABS
1000.0000 mg | ORAL_TABLET | ORAL | Status: AC
  Administered 2020-06-21: 1000 mg via ORAL

## 2020-06-21 MED ORDER — DEXAMETHASONE SODIUM PHOSPHATE 10 MG/ML IJ SOLN
INTRAMUSCULAR | Status: DC | PRN
  Administered 2020-06-21: 10 mg via INTRAVENOUS

## 2020-06-21 MED ORDER — ONDANSETRON HCL 4 MG/2ML IJ SOLN
INTRAMUSCULAR | Status: AC
  Filled 2020-06-21: qty 2

## 2020-06-21 MED ORDER — ONDANSETRON HCL 4 MG/2ML IJ SOLN
INTRAMUSCULAR | Status: DC | PRN
  Administered 2020-06-21: 4 mg via INTRAVENOUS

## 2020-06-21 MED ORDER — IBUPROFEN 800 MG PO TABS: 800.0000 mg | ORAL_TABLET | Freq: Three times a day (TID) | ORAL | 0 refills | Status: DC | PRN

## 2020-06-21 MED ORDER — CEFAZOLIN SODIUM-DEXTROSE 2-4 GM/100ML-% IV SOLN
INTRAVENOUS | Status: AC
  Filled 2020-06-21: qty 100

## 2020-06-21 MED ORDER — LACTATED RINGERS IV SOLN: INTRAVENOUS | Status: DC

## 2020-06-21 MED ORDER — CHLORHEXIDINE GLUCONATE CLOTH 2 % EX PADS: 6.0000 | MEDICATED_PAD | Freq: Once | CUTANEOUS | Status: DC

## 2020-06-21 MED ORDER — CELECOXIB 200 MG PO CAPS
200.0000 mg | ORAL_CAPSULE | ORAL | Status: AC
  Administered 2020-06-21: 200 mg via ORAL

## 2020-06-21 MED ORDER — BUPIVACAINE-EPINEPHRINE 0.25% -1:200000 IJ SOLN
INTRAMUSCULAR | Status: DC | PRN
  Administered 2020-06-21: 18 mL

## 2020-06-21 MED ORDER — PROPOFOL 10 MG/ML IV BOLUS
INTRAVENOUS | Status: DC | PRN
  Administered 2020-06-21: 150 mg via INTRAVENOUS
  Administered 2020-06-21: 50 mg via INTRAVENOUS

## 2020-06-21 MED ORDER — LIDOCAINE 2% (20 MG/ML) 5 ML SYRINGE
INTRAMUSCULAR | Status: DC | PRN
  Administered 2020-06-21: 80 mg via INTRAVENOUS

## 2020-06-21 MED ORDER — PROMETHAZINE HCL 25 MG/ML IJ SOLN: 6.2500 mg | INTRAMUSCULAR | Status: DC | PRN

## 2020-06-21 MED ORDER — AMISULPRIDE (ANTIEMETIC) 5 MG/2ML IV SOLN
10.0000 mg | Freq: Once | INTRAVENOUS | Status: DC | PRN
Start: 2020-06-21 — End: 2020-06-21

## 2020-06-21 SURGICAL SUPPLY — 39 items
ADH SKN CLS APL DERMABOND .7 (GAUZE/BANDAGES/DRESSINGS) ×1
APL PRP STRL LF DISP 70% ISPRP (MISCELLANEOUS) ×1
BLADE SURG 15 STRL LF DISP TIS (BLADE) ×1 IMPLANT
BLADE SURG 15 STRL SS (BLADE) ×2
CANISTER SUCT 1200ML W/VALVE (MISCELLANEOUS) ×2 IMPLANT
CHLORAPREP W/TINT 26 (MISCELLANEOUS) ×2 IMPLANT
COVER BACK TABLE 60X90IN (DRAPES) ×2 IMPLANT
COVER MAYO STAND STRL (DRAPES) ×2 IMPLANT
DECANTER SPIKE VIAL GLASS SM (MISCELLANEOUS) ×2 IMPLANT
DERMABOND ADVANCED (GAUZE/BANDAGES/DRESSINGS) ×1
DERMABOND ADVANCED .7 DNX12 (GAUZE/BANDAGES/DRESSINGS) ×1 IMPLANT
DRAPE LAPAROTOMY 100X72 PEDS (DRAPES) ×2 IMPLANT
DRAPE UTILITY XL STRL (DRAPES) ×2 IMPLANT
ELECT COATED BLADE 2.86 ST (ELECTRODE) ×2 IMPLANT
ELECT REM PT RETURN 9FT ADLT (ELECTROSURGICAL) ×2
ELECTRODE REM PT RTRN 9FT ADLT (ELECTROSURGICAL) ×1 IMPLANT
GLOVE SRG 8 PF TXTR STRL LF DI (GLOVE) ×1 IMPLANT
GLOVE SURG ENC MOIS LTX SZ6.5 (GLOVE) ×2 IMPLANT
GLOVE SURG LTX SZ8 (GLOVE) ×2 IMPLANT
GLOVE SURG UNDER POLY LF SZ7 (GLOVE) ×2 IMPLANT
GLOVE SURG UNDER POLY LF SZ8 (GLOVE) ×2
GOWN STRL REUS W/ TWL LRG LVL3 (GOWN DISPOSABLE) ×2 IMPLANT
GOWN STRL REUS W/ TWL XL LVL3 (GOWN DISPOSABLE) ×1 IMPLANT
GOWN STRL REUS W/TWL LRG LVL3 (GOWN DISPOSABLE) ×4
GOWN STRL REUS W/TWL XL LVL3 (GOWN DISPOSABLE) ×2
HEMOSTAT ARISTA ABSORB 3G PWDR (HEMOSTASIS) IMPLANT
NDL HYPO 25X1 1.5 SAFETY (NEEDLE) ×1 IMPLANT
NEEDLE HYPO 25X1 1.5 SAFETY (NEEDLE) ×2 IMPLANT
NS IRRIG 1000ML POUR BTL (IV SOLUTION) ×2 IMPLANT
PACK BASIN DAY SURGERY FS (CUSTOM PROCEDURE TRAY) ×2 IMPLANT
PENCIL SMOKE EVACUATOR (MISCELLANEOUS) ×2 IMPLANT
SLEEVE SCD COMPRESS KNEE MED (STOCKING) ×2 IMPLANT
SPONGE LAP 4X18 RFD (DISPOSABLE) ×2 IMPLANT
SUT MON AB 4-0 PC3 18 (SUTURE) ×2 IMPLANT
SUT VICRYL 3-0 CR8 SH (SUTURE) ×2 IMPLANT
SYR CONTROL 10ML LL (SYRINGE) ×2 IMPLANT
TOWEL GREEN STERILE FF (TOWEL DISPOSABLE) ×4 IMPLANT
TUBE CONNECTING 20X1/4 (TUBING) ×2 IMPLANT
YANKAUER SUCT BULB TIP NO VENT (SUCTIONS) ×2 IMPLANT

## 2020-06-21 NOTE — Anesthesia Procedure Notes (Signed)
Procedure Name: LMA Insertion Date/Time: 06/21/2020 12:06 PM Performed by: Genelle Bal, CRNA Pre-anesthesia Checklist: Patient identified, Emergency Drugs available, Suction available and Patient being monitored Patient Re-evaluated:Patient Re-evaluated prior to induction Oxygen Delivery Method: Circle system utilized Preoxygenation: Pre-oxygenation with 100% oxygen Induction Type: IV induction Ventilation: Mask ventilation without difficulty LMA: LMA inserted LMA Size: 4.0 Number of attempts: 1 Airway Equipment and Method: Bite block Placement Confirmation: positive ETCO2 Tube secured with: Tape Dental Injury: Teeth and Oropharynx as per pre-operative assessment

## 2020-06-21 NOTE — Anesthesia Preprocedure Evaluation (Signed)
Anesthesia Evaluation  Patient identified by MRN, date of birth, ID band Patient awake    Reviewed: Allergy & Precautions, NPO status , Patient's Chart, lab work & pertinent test results  Airway Mallampati: II  TM Distance: >3 FB Neck ROM: Full    Dental  (+) Dental Advisory Given   Pulmonary neg pulmonary ROS,    breath sounds clear to auscultation       Cardiovascular hypertension, Pt. on medications  Rhythm:Regular Rate:Normal     Neuro/Psych negative neurological ROS     GI/Hepatic GERD  ,(+) Cirrhosis       ,   Endo/Other  diabetes, Type 2, Oral Hypoglycemic AgentsHypothyroidism   Renal/GU negative Renal ROS     Musculoskeletal  (+) Arthritis ,   Abdominal (+) + obese,   Peds  Hematology  (+) Blood dyscrasia (thrombocytopenia), ,   Anesthesia Other Findings   Reproductive/Obstetrics                             Anesthesia Physical  Anesthesia Plan  ASA: II  Anesthesia Plan: General   Post-op Pain Management:    Induction: Intravenous  PONV Risk Score and Plan: 3 and Dexamethasone, Ondansetron, Treatment may vary due to age or medical condition and Midazolam  Airway Management Planned: LMA  Additional Equipment:   Intra-op Plan:   Post-operative Plan: Extubation in OR  Informed Consent: I have reviewed the patients History and Physical, chart, labs and discussed the procedure including the risks, benefits and alternatives for the proposed anesthesia with the patient or authorized representative who has indicated his/her understanding and acceptance.     Dental advisory given  Plan Discussed with: CRNA  Anesthesia Plan Comments:         Anesthesia Quick Evaluation

## 2020-06-21 NOTE — Discharge Instructions (Signed)
GENERAL SURGERY: POST OP INSTRUCTIONS  ######################################################################  EAT Gradually transition to a high fiber diet with a fiber supplement over the next few weeks after discharge.  Start with a pureed / full liquid diet (see below)  WALK Walk an hour a day.  Control your pain to do that.    CONTROL PAIN Control pain so that you can walk, sleep, tolerate sneezing/coughing, go up/down stairs.  HAVE A BOWEL MOVEMENT DAILY Keep your bowels regular to avoid problems.  OK to try a laxative to override constipation.  OK to use an antidairrheal to slow down diarrhea.  Call if not better after 2 tries  CALL IF YOU HAVE PROBLEMS/CONCERNS Call if you are still struggling despite following these instructions. Call if you have concerns not answered by these instructions  ######################################################################    1. DIET: Follow a light bland diet & liquids the first 24 hours after arrival home, such as soup, liquids, starches, etc.  Be sure to drink plenty of fluids.  Quickly advance to a usual solid diet within a few days.  Avoid fast food or heavy meals as your are more likely to get nauseated or have irregular bowels.  A low-fat, high-fiber diet for the rest of your life is ideal.   2. Take your usually prescribed home medications unless otherwise directed. 3. PAIN CONTROL: a. Pain is best controlled by a usual combination of three different methods TOGETHER: i. Ice/Heat ii. Over the counter pain medication iii. Prescription pain medication b. Most patients will experience some swelling and bruising around the incisions.  Ice packs or heating pads (30-60 minutes up to 6 times a day) will help. Use ice for the first few days to help decrease swelling and bruising, then switch to heat to help relax tight/sore spots and speed recovery.  Some people prefer to use ice alone, heat alone, alternating between ice & heat.   Experiment to what works for you.  Swelling and bruising can take several weeks to resolve.   c. It is helpful to take an over-the-counter pain medication regularly for the first few weeks.  Choose one of the following that works best for you: i. Naproxen (Aleve, etc)  Two 229m tabs twice a day ii. Ibuprofen (Advil, etc) Three 2027mtabs four times a day (every meal & bedtime) iii. Acetaminophen (Tylenol, etc) 500-65066mour times a day (every meal & bedtime) d. A  prescription for pain medication (such as oxycodone, hydrocodone, etc) should be given to you upon discharge.  Take your pain medication as prescribed.  i. If you are having problems/concerns with the prescription medicine (does not control pain, nausea, vomiting, rash, itching, etc), please call us [phone] see if we need to switch you to a different pain medicine that will work better for you and/or control your side effect better. ii. If you need a refill on your pain medication, please contact your pharmacy.  They will contact our office to request authorization. Prescriptions will not be filled after 5 pm or on week-ends. 4. Avoid getting constipated.  Between the surgery and the pain medications, it is common to experience some constipation.  Increasing fluid intake and taking a fiber supplement (such as Metamucil, Citrucel, FiberCon, MiraLax, etc) 1-2 times a day regularly will usually help prevent this problem from occurring.  A mild laxative (prune juice, Milk of Magnesia, MiraLax, etc) should be taken according to package directions if there are no bowel movements after 48 hours.   5. Wash /  shower every day.  You may shower over the dressings as they are waterproof.  Continue to shower over incision(s) after the dressing is off. 6. Remove your waterproof bandages 5 days after surgery.  You may leave the incision open to air.  You may have skin tapes (Steri Strips) covering the incision(s).  Leave them on until one week, then  remove.  You may replace a dressing/Band-Aid to cover the incision for comfort if you wish.      7. ACTIVITIES as tolerated:   a. You may resume regular (light) daily activities beginning the next day--such as daily self-care, walking, climbing stairs--gradually increasing activities as tolerated.  If you can walk 30 minutes without difficulty, it is safe to try more intense activity such as jogging, treadmill, bicycling, low-impact aerobics, swimming, etc. b. Save the most intensive and strenuous activity for last such as sit-ups, heavy lifting, contact sports, etc  Refrain from any heavy lifting or straining until you are off narcotics for pain control.   c. DO NOT PUSH THROUGH PAIN.  Let pain be your guide: If it hurts to do something, don't do it.  Pain is your body warning you to avoid that activity for another week until the pain goes down. d. You may drive when you are no longer taking prescription pain medication, you can comfortably wear a seatbelt, and you can safely maneuver your car and apply brakes. e. Dennis Bast may have sexual intercourse when it is comfortable.  8. FOLLOW UP in our office a. Please call [facility] at [phone] to set up an appointment to see your surgeon in the office for a follow-up appointment approximately 2-3 weeks after your surgery. b. Make sure that you call for this appointment the day you arrive home to insure a convenient appointment time. 9. IF YOU HAVE DISABILITY OR FAMILY LEAVE FORMS, BRING THEM TO THE OFFICE FOR PROCESSING.  DO NOT GIVE THEM TO YOUR DOCTOR.   WHEN TO CALL us [phone]: 1. Poor pain control 2. Reactions / problems with new medications (rash/itching, nausea, etc)  3. Fever over 101.5 F (38.5 C) 4. Worsening swelling or bruising 5. Continued bleeding from incision. 6. Increased pain, redness, or drainage from the incision 7. Difficulty breathing / swallowing   The clinic staff is available to answer your questions during regular  business hours (8:30am-5pm).  Please don't hesitate to call and ask to speak to one of our nurses for clinical concerns.   If you have a medical emergency, go to the nearest emergency room or call 911.  A surgeon from [facility] is always on call at the [facility], [place], [place], [place], [place]  [id number] ? MAIN: [phone] ? TOLL FREE: [phone] ?  FAX [phone] [email or web address]  No Tylenol or NSAIDs (ie. Ibuprofen, Advil, motrin) before 4:45pm today. Post Anesthesia Home Care Instructions  Activity: Get plenty of rest for the remainder of the day. A responsible individual must stay with you for 24 hours following the procedure.  For the next 24 hours, DO NOT: -Drive a car -Paediatric nurse -Drink alcoholic beverages -Take any medication unless instructed by your physician -Make any legal decisions or sign important papers.  Meals: Start with liquid foods such as gelatin or soup. Progress to regular foods as tolerated. Avoid greasy, spicy, heavy foods. If nausea and/or vomiting occur, drink only clear liquids until the nausea and/or vomiting subsides. Call your physician if vomiting continues.  Special Instructions/Symptoms: Your throat may feel dry or sore from the anesthesia or the breathing tube placed in your throat during surgery. If this causes discomfort, gargle with warm salt water. The discomfort should disappear within 24 hours.  If you had a scopolamine patch placed behind your ear for the management of post- operative nausea and/or vomiting:  1. The medication in the patch is effective for 72 hours, after which it should be removed.  Wrap patch in a tissue and discard in the trash. Wash hands thoroughly with soap and water. 2. You may remove the patch earlier than 72 hours if you experience unpleasant side effects which may include dry mouth, dizziness or visual disturbances. 3.  Avoid touching the patch. Wash your hands with soap and water after contact with the patch.

## 2020-06-21 NOTE — Progress Notes (Signed)
No labs drawn within last two weeks. CMP and CBC results in EMR from 04/20/20. Okay to proceed with surgery per Dr. Sabra Heck, anesthesiologist. No new labs needed at this time.

## 2020-06-21 NOTE — Interval H&P Note (Signed)
History and Physical Interval Note:  06/21/2020 11:24 AM  Catherine Decker  has presented today for surgery, with the diagnosis of RIGHT MASTECTOMY/ HISTORY OF RIGHT BREAST CANCER.  The various methods of treatment have been discussed with the patient and family. After consideration of risks, benefits and other options for treatment, the patient has consented to  Procedure(s): [place] (Right) as a surgical intervention.  The patient's history has been reviewed, patient examined, no change in status, stable for surgery.  I have reviewed the patient's chart and labs.  Questions were answered to the patient's satisfaction.     [place]

## 2020-06-21 NOTE — Op Note (Signed)
Preoperative diagnosis: Right mastectomy scar with excess tissue  Postoperative diagnosis: Same  Procedure: Revision right mastectomy scar  Surgeon: Erroll Luna, MD  Anesthesia: LMA with local 0.25% Marcaine plain  EBL: Minimal  Specimen: Excessive skin from right mastectomy wound  Drains: None  Indications for procedure: The patient is a 80-year-old female who is status post right mastectomy for breast cancer.  She is developed excessive skin in the right axilla which has been rubbing the axilla causing irritation and discomfort.  She presents today for excision of the excess skin revision of the right mastectomy scar.  Risks and benefits of surgery discussed.  Long-term expectations in expected cosmetic outcome discussed.The procedure has been discussed with the patient.  Alternative therapies have been discussed with the patient.  Operative risks include bleeding,  Infection,  Organ injury,  Nerve injury,  Blood vessel injury,  DVT,  Pulmonary embolism,  Death,  And possible reoperation.  Medical management risks include worsening of present situation.  The success of the procedure is 50 -90 % at treating patients symptoms.  The patient understands and agrees to proceed.     Description of procedure: The patient was met in the holding and questions were answered.  Right side was marked as correct site.  She was taken back to the abdomen.  She is placed supine upon the OR table.  After induction of general esthesia, the right chest region and right axilla were prepped and draped in sterile fashion timeout performed.  Proper patient, site and procedure verified.  The excessive skin was mostly in the right axilla.  Elliptical incision was made around the excessive skin.  This was excised in its entirety down to subcutaneous fat and passed off the field.  Hemostasis was achieved.  The wound was then closed in 2 layers with 3-0 Vicryl for Monocryl.  The length of excision was 8 cm and the width  was about 4 cm.  Dermabond applied.  Dry dressing placed.  All counts were found to be correct.  The patient was awoke extubated taken to recovery in satisfactory condition.

## 2020-06-21 NOTE — Transfer of Care (Signed)
Immediate Anesthesia Transfer of Care Note  Patient: Catherine Decker  Procedure(s) Performed: SCAR REVISION OF RIGHT MASTECTOMY (Right Breast)  Patient Location: PACU  Anesthesia Type:General  Level of Consciousness: awake, alert  and oriented  Airway & Oxygen Therapy: Patient Spontanous Breathing and Patient connected to face mask oxygen  Post-op Assessment: Report given to RN and Post -op Vital signs reviewed and stable  Post vital signs: Reviewed and stable  Last Vitals:  Vitals Value Taken Time  BP 114/72   Temp    Pulse 84 06/21/20 1249  Resp 16 06/21/20 1249  SpO2 98 % 06/21/20 1249  Vitals shown include unvalidated device data.  Last Pain:  Vitals:   06/21/20 1042  TempSrc: Oral  PainSc: 0-No pain         Complications: No complications documented.

## 2020-06-22 LAB — SURGICAL PATHOLOGY

## 2020-06-25 ENCOUNTER — Encounter (HOSPITAL_BASED_OUTPATIENT_CLINIC_OR_DEPARTMENT_OTHER): Payer: Self-pay | Admitting: Surgery

## 2020-06-25 NOTE — Anesthesia Postprocedure Evaluation (Signed)
Anesthesia Post Note  Patient: Catherine Decker  Procedure(s) Performed: SCAR REVISION OF RIGHT MASTECTOMY (Right Breast)     Patient location during evaluation: PACU Anesthesia Type: General Level of consciousness: awake and alert Pain management: pain level controlled Vital Signs Assessment: post-procedure vital signs reviewed and stable Respiratory status: spontaneous breathing, nonlabored ventilation and respiratory function stable Cardiovascular status: blood pressure returned to baseline and stable Postop Assessment: no apparent nausea or vomiting Anesthetic complications: no   No complications documented.  Last Vitals:  Vitals:   06/21/20 1300 06/21/20 1334  BP: (!) 130/50 135/68  Pulse: 77 72  Resp: 17 15  Temp:  36.6 C  SpO2: 96% 98%    Last Pain:  Vitals:   06/21/20 1320  TempSrc:   PainSc: 0-No pain                 Lynda [place]

## 2020-07-02 DIAGNOSIS — Z7984 Long term (current) use of oral hypoglycemic drugs: Secondary | ICD-10-CM | POA: Diagnosis not present

## 2020-07-02 DIAGNOSIS — E119 Type 2 diabetes mellitus without complications: Secondary | ICD-10-CM | POA: Diagnosis not present

## 2020-07-02 DIAGNOSIS — H524 Presbyopia: Secondary | ICD-10-CM | POA: Diagnosis not present

## 2020-07-02 DIAGNOSIS — H04123 Dry eye syndrome of bilateral lacrimal glands: Secondary | ICD-10-CM | POA: Diagnosis not present

## 2020-07-02 DIAGNOSIS — H52203 Unspecified astigmatism, bilateral: Secondary | ICD-10-CM | POA: Diagnosis not present

## 2020-07-02 DIAGNOSIS — Z961 Presence of intraocular lens: Secondary | ICD-10-CM | POA: Diagnosis not present

## 2020-07-05 DIAGNOSIS — H524 Presbyopia: Secondary | ICD-10-CM | POA: Diagnosis not present

## 2020-07-05 DIAGNOSIS — H5203 Hypermetropia, bilateral: Secondary | ICD-10-CM | POA: Diagnosis not present

## 2020-07-05 DIAGNOSIS — H52209 Unspecified astigmatism, unspecified eye: Secondary | ICD-10-CM | POA: Diagnosis not present

## 2020-07-05 DIAGNOSIS — H5213 Myopia, bilateral: Secondary | ICD-10-CM | POA: Diagnosis not present

## 2020-07-16 DIAGNOSIS — Z7984 Long term (current) use of oral hypoglycemic drugs: Secondary | ICD-10-CM | POA: Diagnosis not present

## 2020-07-16 DIAGNOSIS — D696 Thrombocytopenia, unspecified: Secondary | ICD-10-CM | POA: Diagnosis not present

## 2020-07-16 DIAGNOSIS — E039 Hypothyroidism, unspecified: Secondary | ICD-10-CM | POA: Diagnosis not present

## 2020-07-16 DIAGNOSIS — N1831 Chronic kidney disease, stage 3a: Secondary | ICD-10-CM | POA: Diagnosis not present

## 2020-07-16 DIAGNOSIS — R251 Tremor, unspecified: Secondary | ICD-10-CM | POA: Diagnosis not present

## 2020-07-16 DIAGNOSIS — M79671 Pain in right foot: Secondary | ICD-10-CM | POA: Diagnosis not present

## 2020-07-16 DIAGNOSIS — E782 Mixed hyperlipidemia: Secondary | ICD-10-CM | POA: Diagnosis not present

## 2020-07-16 DIAGNOSIS — E1129 Type 2 diabetes mellitus with other diabetic kidney complication: Secondary | ICD-10-CM | POA: Diagnosis not present

## 2020-07-16 DIAGNOSIS — E559 Vitamin D deficiency, unspecified: Secondary | ICD-10-CM | POA: Diagnosis not present

## 2020-07-16 DIAGNOSIS — R0600 Dyspnea, unspecified: Secondary | ICD-10-CM | POA: Diagnosis not present

## 2020-07-16 DIAGNOSIS — Z8616 Personal history of COVID-19: Secondary | ICD-10-CM | POA: Diagnosis not present

## 2020-07-16 DIAGNOSIS — I129 Hypertensive chronic kidney disease with stage 1 through stage 4 chronic kidney disease, or unspecified chronic kidney disease: Secondary | ICD-10-CM | POA: Diagnosis not present

## 2020-07-18 ENCOUNTER — Other Ambulatory Visit: Payer: Self-pay | Admitting: Hematology and Oncology

## 2020-07-18 DIAGNOSIS — C50911 Malignant neoplasm of unspecified site of right female breast: Secondary | ICD-10-CM

## 2020-07-18 NOTE — Progress Notes (Signed)
[place] [phone]   [phone]  PROGRESS NOTE  Patient Care Team: Antony Contras, MD as PCP - General (Family Medicine)  Hematological/Oncological History #Leukopenia, Lymphopenia #Thrombocytopenia 1) 05/15/2010: WBC 4.5, Hgb 14.1, Plt 124, MCV 88.5. ANC 2.9, Lymph 1.1. Earliest CBC on record.  2) 01/25/2019: WBC 3.0, Hgb 12.8, Plt 101, MCV 93.3. ANC 1.8, Lymph 0.8, Mono 0.2 3) 03/15/2019: WBC 2.5, Hgb 12.9, Plt 96. MCV 93.6. ANC 1.5, Lymph 0.7, Mono 0.2 4) 03/31/2019: establish care with Dr. Lorenso Courier    #Recurrent Breast Cancer  1) underwent lumpectomy and radiation therapy in 2000 on right breast cancer  2) declined chemotherapy and only received 6 months of tamoxifen therapy 3) 06/16/2019: Mammogram showed a new 1.4 cm mass has been noted in the right breast. Biopsy performed showed 90% ER, 20% PR, and HER2 negative. 4) 07/01/2019: clinic visit scheduled with Breast Surgery  5) 09/07/2019: right simple mastectomy performed with sentinel lymph node mapping. Findings showed an invasive lobular carcinoma 1.4 cm with no evidence of lymph node involvement.   Interval History:  Catherine Decker 80 y.o. female with medical history significant for leukopenia/thrombocytopenia who presents for a follow up visit. The patient's last visit was on 04/20/2020.  In the interim since her last visit she has been compliant with letrozole therapy.  On exam today Catherine Decker reports she has been well in the interim since her last visit.  She reports that she had to have another surgery done on her breast area in order to help with the excess skin from the prior surgery.  This was more of a cosmetic procedure.  She reports that her appetite is been good and her weight has been stable.  She has been taking her letrozole pills without any difficulty.  She denies any side effects from this medication she is not had any missed doses.  She reports that she does "continue to feel  shaky".  And she is "always had troubles.  She is concerned because her father and brother both had Parkinson's disease.  She reports that she is currently scheduled for a mammogram coming up in November 2022.  She currently denies having any issues with fevers, chills, sweats, nausea, vomiting or diarrhea.  A full 10 point ROS is the blood.  MEDICAL HISTORY:  Past Medical History:  Diagnosis Date  . Anemia   . Arthritis of back   . Arthritis of hand    per patient right hand  . Autoimmune deficiency syndrome ([place])    per patient  . Breast cancer ([place])   . Cancer ([place]) 08/2019   right breast ILC  . Cirrhosis ([place])   . Cirrhosis of liver ([place])   . Complication of anesthesia    per patient, slow to awake  . Diabetes mellitus   . GERD (gastroesophageal reflux disease)    Also, Hiatal Hernia   . H/O migraine   . 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 Hyperlipidemia   . Hypertension   . Hypothyroidism   . Splenic artery aneurysm [facility])     SURGICAL HISTORY: Past Surgical History:  Procedure Laterality Date  . APPENDECTOMY    . BREAST LUMPECTOMY     Right breast lumpectomy  . CATARACT EXTRACTION W/ INTRAOCULAR LENS  IMPLANT, BILATERAL  2018  . CHOLECYSTECTOMY  04/2010  . MASTECTOMY W/ SENTINEL NODE BIOPSY Right 09/07/2019   Procedure: RIGHT SIMPLE MASTECTOMY WITH SENTINEL LYMPH NODE  MAPPING;  Surgeon: Erroll Luna, MD;  Location: [place];  Service: General;  Laterality: Right;  PEC BLOCK  . SCAR REVISION Right 06/21/2020   Procedure: SCAR REVISION OF RIGHT MASTECTOMY;  Surgeon: Erroll Luna, MD;  Location: [place];  Service: General;  Laterality: Right;  . TONSILLECTOMY    . TUBAL LIGATION  1975    ALLERGIES:  is allergic to demerol, meperidine, and meperidine hcl.  MEDICATIONS:  Current Outpatient Medications  Medication Sig Dispense Refill  . calcium carbonate (OS-CAL) 600 MG TABS tablet Take 600 mg by mouth daily with  breakfast.    . cholecalciferol (VITAMIN D3) 25 MCG (1000 UNIT) tablet Take 1,000 Units by mouth daily.    Marland Kitchen CINNAMON PO Take 500 mg by mouth daily.     . Coenzyme Q10 (COQ10) 100 MG CAPS     . fenofibrate 160 MG tablet Take 160 mg by mouth daily.    . folic acid (FOLVITE) 1 MG tablet Take 1 tablet (1 mg total) by mouth daily. 90 tablet 3  . Ginkgo Biloba 60 MG CAPS Take 60 mg by mouth daily.    . Glucose Blood (ONETOUCH ULTRA BLUE VI) daily.    Marland Kitchen ibuprofen (ADVIL) 800 MG tablet Take 1 tablet (800 mg total) by mouth every 8 (eight) hours as needed. 30 tablet 0  . letrozole (FEMARA) 2.5 MG tablet Take 1 tablet (2.5 mg total) by mouth daily. 90 tablet 3  . levothyroxine (SYNTHROID) 112 MCG tablet Take 112 mcg by mouth daily before breakfast.    . losartan (COZAAR) 25 MG tablet Take 12.5 mg by mouth daily.     . metFORMIN (GLUCOPHAGE) 500 MG tablet Take 500 mg by mouth daily with breakfast.    . Omega-3 Fatty Acids (FISH OIL) 600 MG CAPS Take 600 mg by mouth daily.    Marland Kitchen omeprazole (PRILOSEC) 20 MG capsule Take 20 mg by mouth daily as needed (acid reflux).     Marland Kitchen oxyCODONE (OXY IR/ROXICODONE) 5 MG immediate release tablet Take 1 tablet (5 mg total) by mouth every 6 (six) hours as needed for severe pain. (Patient not taking: Reported on 07/19/2020) 15 tablet 0  . pioglitazone (ACTOS) 15 MG tablet Take 15 mg by mouth daily.    . simvastatin (ZOCOR) 20 MG tablet Take 20 mg by mouth every evening.    . vitamin B-12 (CYANOCOBALAMIN) 1000 MCG tablet Take 1,000 mcg by mouth daily.     No current facility-administered medications for this visit.    REVIEW OF SYSTEMS:   Constitutional: ( - ) fevers, ( - )  chills , ( - ) night sweats Eyes: ( - ) blurriness of vision, ( - ) double vision, ( - ) watery eyes Ears, nose, mouth, throat, and face: ( - ) mucositis, ( - ) sore throat Respiratory: ( - ) cough, ( - ) dyspnea, ( - ) wheezes Cardiovascular: ( - ) palpitation, ( - ) chest discomfort, ( - ) lower  extremity swelling Gastrointestinal:  ( - ) nausea, ( - ) heartburn, ( - ) change in bowel habits Skin: ( - ) abnormal skin rashes Lymphatics: ( - ) new lymphadenopathy, ( - ) easy bruising Neurological: ( - ) numbness, ( - ) tingling, ( - ) new weaknesses Behavioral/Psych: ( - ) mood change, ( - ) new changes  All other systems were reviewed with the patient and are negative.  PHYSICAL EXAMINATION: ECOG PERFORMANCE STATUS: 1 - Symptomatic but completely ambulatory  Vitals:   07/19/20 0838  BP: (!) 150/46  Pulse: 71  Resp: 18  Temp: 98.2 F (36.8 C)  SpO2: 99%   Filed Weights   07/19/20 0838  Weight: 176 lb 9.6 oz (80.1 kg)    GENERAL: well appearing elderly Caucasian female in NAD  SKIN: skin color, texture, turgor are normal, no rashes or significant lesions EYES: conjunctiva are pink and non-injected, sclera clear BREAST: s/p simple mastectomy. No evidence of infection, well healing incision. Drains in place.  LUNGS: clear to auscultation and percussion with normal breathing effort HEART: regular rate & rhythm and no murmurs and no lower extremity edema Musculoskeletal: no cyanosis of digits and no clubbing  PSYCH: alert & oriented x 3, fluent speech NEURO: no focal motor/sensory deficits  LABORATORY DATA:  I have reviewed the data as listed CBC Latest Ref Rng & Units 07/19/2020 04/20/2020 02/28/2020  WBC 4.0 - 10.5 K/uL 2.6(L) 2.5(L) 3.8(L)  Hemoglobin 12.0 - 15.0 g/dL 11.5(L) 11.9(L) 12.4  Hematocrit 36.0 - 46.0 % 35.3(L) 36.8 37.4  Platelets 150 - 400 K/uL 103(L) 83(L) 96(L)    CMP Latest Ref Rng & Units 07/19/2020 04/20/2020 02/28/2020  Glucose 70 - 99 mg/dL 129(H) 124(H) 108(H)  BUN 8 - 23 mg/dL 21 24(H) 18  Creatinine 0.44 - 1.00 mg/dL 1.21(H) 1.20(H) 1.17(H)  Sodium 135 - 145 mmol/L 143 141 139  Potassium 3.5 - 5.1 mmol/L 4.0 4.7 4.3  Chloride 98 - 111 mmol/L 110 110 108  CO2 22 - 32 mmol/L _0 Calcium 8.9 - 10.3 mg/dL 9.5 9.3 9.5  Total Protein 6.5 -  8.1 g/dL 6.6 6.9 7.2  Total Bilirubin 0.3 - 1.2 mg/dL 0.5 0.5 1.0  Alkaline Phos 38 - 126 U/L 49 46 48  AST 15 - 41 U/L 33 39 34  ALT 0 - 44 U/L _1 RADIOGRAPHIC STUDIES:  No results found.  ASSESSMENT & PLAN Catherine Decker 80 y.o. female with medical history significant for leukopenia/thrombocytopenia who presents for a follow up visit.  After review the labs discussion with the patient her findings are most consistent with leukopenia and thrombocytopenia due to chronic liver disease.  The patient has not responded to vitamin B12 or folate therapy, but we did wish to assure that her levels of these are replete.  The patient is fortunately already established with the [place] [facility] and therefore these cytopenias could further be followed in their clinic with re-referral in the event that they were to worsen.  In terms of her breast cancer, she was found to have a new 1.5 cm lesion within the right breast on 06/16/2019.  She has already undergone biopsy of this lesion which shows an ER positive, PR positive, HER-2 negative breast cancer.  Surgical resection was performed on 09/07/2019 which revealed invasive lobular carcinoma.  Started on letrozole therapy on 10/11/2019.  On exam today Catherine Decker is at her baseline level of health and is tolerating her letrozole therapy quite well.  She had a CT chest abdomen pelvis on 02/28/2020 which showed no evidence of metastatic disease.   #Bicytopenia: Leukopenia and Thrombocytopenia, stable --most likely etiology at this time is the patient's cirrhosis of the liver.  --patient has been on vitamin b12 and folate in the interim with no remarkable change in her blood counts. --continue to monitor  #Cirrhosis of the Liver --unclear etiology, thought be 2/2 to childhood illness with hepatitis --following with [facility] hepatology for evaluation and  [place] screening --continue to monitor   #History of Breast Cancer #Recurrence of Breast Cancer:  ER+/PR+/HER2- --underwent lumpectomy and radiation therapy in 2000 --declined chemotherapy and only received 6 months of tamoxifen therapy --prior mammogram performed in 2018, new imaging performed 06/2019 --A 1.4 cm mass has been noted in the right breast. Biopsy performed showed 90% ER, 20% PR, and HER2 negative.  --surgical resection of this mass was performed on 09/07/2019 with findings showing invasive lobular carcinoma Nottingham Grade 2 of 3, 2.2 cm. Found to have a deep margin.  No carcinoma noted in lymph nodes.  -- Based on the CALOR Trial ( J Clin Oncol. 6613395058) would recommend adjuvant aromatase ihibitor therapy.  --baseline CT C/A/P showed no evidence of metastatic disease. --RTC in 6 months for continued monitoring  #Tremor --patient has a resting tremor and occasional "shakes" --these tremors were no visualized on exam today but reported by the patient --family history of Parkinson's in her father and brother. She is requesting neurology evaluation --will refer to Dr. Mickeal Skinner.   No orders of the defined types were placed in this encounter.  All questions were answered. The patient knows to call the clinic with any problems, questions or concerns.  A total of more than 30 minutes were spent on this encounter and over half of that time was spent on counseling and coordination of care as outlined above.   Ledell Peoples, MD Department of Hematology/Oncology [place] at [facility] Phone: [phone] Pager: [phone] Email: [email or web address]  07/19/2020 10:05 AM   Literature Support:   Donley Redder Vernie Murders, et al. Efficacy of chemotherapy for ER-negative and ER-positive isolated locoregional recurrence of breast cancer: final analysis of the CALOR trial. J Clin Oncol. 8288;33:7445-1.  --The final analysis of CALOR confirms that chemtotherapy benefits patients with resected ER-negative ILRR and does not support the  use of chemotherapy for ER-positive ILRR.

## 2020-07-19 ENCOUNTER — Inpatient Hospital Stay: Payer: Medicare HMO

## 2020-07-19 ENCOUNTER — Other Ambulatory Visit: Payer: Self-pay

## 2020-07-19 ENCOUNTER — Encounter: Payer: Self-pay | Admitting: Hematology and Oncology

## 2020-07-19 ENCOUNTER — Inpatient Hospital Stay: Payer: Medicare HMO | Attending: Hematology and Oncology | Admitting: Hematology and Oncology

## 2020-07-19 VITALS — BP 150/46 | HR 71 | Temp 98.2°F | Resp 18 | Wt 176.6 lb

## 2020-07-19 DIAGNOSIS — Z79899 Other long term (current) drug therapy: Secondary | ICD-10-CM | POA: Insufficient documentation

## 2020-07-19 DIAGNOSIS — K7460 Unspecified cirrhosis of liver: Secondary | ICD-10-CM | POA: Diagnosis not present

## 2020-07-19 DIAGNOSIS — C50911 Malignant neoplasm of unspecified site of right female breast: Secondary | ICD-10-CM

## 2020-07-19 DIAGNOSIS — D72819 Decreased white blood cell count, unspecified: Secondary | ICD-10-CM | POA: Insufficient documentation

## 2020-07-19 DIAGNOSIS — D696 Thrombocytopenia, unspecified: Secondary | ICD-10-CM | POA: Diagnosis not present

## 2020-07-19 DIAGNOSIS — Z79811 Long term (current) use of aromatase inhibitors: Secondary | ICD-10-CM | POA: Diagnosis not present

## 2020-07-19 DIAGNOSIS — Z170 Estrogen receptor positive status [ER+]: Secondary | ICD-10-CM | POA: Diagnosis not present

## 2020-07-19 DIAGNOSIS — Z923 Personal history of irradiation: Secondary | ICD-10-CM | POA: Insufficient documentation

## 2020-07-19 LAB — CBC WITH DIFFERENTIAL (CANCER CENTER ONLY)
Abs Immature Granulocytes: 0.02 10*3/uL (ref 0.00–0.07)
Basophils Absolute: 0 10*3/uL (ref 0.0–0.1)
Basophils Relative: 1 %
Eosinophils Absolute: 0.2 10*3/uL (ref 0.0–0.5)
Eosinophils Relative: 6 %
HCT: 35.3 % — ABNORMAL LOW (ref 36.0–46.0)
Hemoglobin: 11.5 g/dL — ABNORMAL LOW (ref 12.0–15.0)
Immature Granulocytes: 1 %
Lymphocytes Relative: 25 %
Lymphs Abs: 0.7 10*3/uL (ref 0.7–4.0)
MCH: 31.5 pg (ref 26.0–34.0)
MCHC: 32.6 g/dL (ref 30.0–36.0)
MCV: 96.7 fL (ref 80.0–100.0)
Monocytes Absolute: 0.2 10*3/uL (ref 0.1–1.0)
Monocytes Relative: 8 %
Neutro Abs: 1.6 10*3/uL — ABNORMAL LOW (ref 1.7–7.7)
Neutrophils Relative %: 59 %
Platelet Count: 103 10*3/uL — ABNORMAL LOW (ref 150–400)
RBC: 3.65 MIL/uL — ABNORMAL LOW (ref 3.87–5.11)
RDW: 13.2 % (ref 11.5–15.5)
WBC Count: 2.6 10*3/uL — ABNORMAL LOW (ref 4.0–10.5)
nRBC: 0 % (ref 0.0–0.2)

## 2020-07-19 LAB — CMP (CANCER CENTER ONLY)
ALT: 12 U/L (ref 0–44)
AST: 33 U/L (ref 15–41)
Albumin: 3.2 g/dL — ABNORMAL LOW (ref 3.5–5.0)
Alkaline Phosphatase: 49 U/L (ref 38–126)
Anion gap: 8 (ref 5–15)
BUN: 21 mg/dL (ref 8–23)
CO2: 25 mmol/L (ref 22–32)
Calcium: 9.5 mg/dL (ref 8.9–10.3)
Chloride: 110 mmol/L (ref 98–111)
Creatinine: 1.21 mg/dL — ABNORMAL HIGH (ref 0.44–1.00)
GFR, Estimated: 46 mL/min — ABNORMAL LOW (ref >60)
Glucose, Bld: 129 mg/dL — ABNORMAL HIGH (ref 70–99)
Potassium: 4 mmol/L (ref 3.5–5.1)
Sodium: 143 mmol/L (ref 135–145)
Total Bilirubin: 0.5 mg/dL (ref 0.3–1.2)
Total Protein: 6.6 g/dL (ref 6.5–8.1)

## 2020-07-20 ENCOUNTER — Telehealth: Payer: Self-pay | Admitting: Hematology and Oncology

## 2020-07-20 NOTE — Telephone Encounter (Signed)
Scheduled per los. Called and left msg. Mailed printout  °

## 2020-08-08 ENCOUNTER — Other Ambulatory Visit: Payer: Self-pay

## 2020-08-08 ENCOUNTER — Ambulatory Visit: Payer: Medicare HMO | Admitting: Pulmonary Disease

## 2020-08-08 ENCOUNTER — Other Ambulatory Visit: Payer: Medicare HMO

## 2020-08-08 ENCOUNTER — Encounter: Payer: Self-pay | Admitting: Pulmonary Disease

## 2020-08-08 VITALS — BP 126/62 | HR 78 | Temp 98.1°F | Ht 64.0 in | Wt 173.6 lb

## 2020-08-08 DIAGNOSIS — J42 Unspecified chronic bronchitis: Secondary | ICD-10-CM | POA: Diagnosis not present

## 2020-08-08 NOTE — Patient Instructions (Signed)
Chronic bronchitis --ARRANGE pulmonary function tests --If normal, no further work-up indicated  Follow-up with NP after PFTs

## 2020-08-08 NOTE — Progress Notes (Signed)
Subjective:   PATIENT ID: Catherine Decker GENDER: female DOB: April 26, 1940, MRN: [id number]   HPI  Chief Complaint  Patient presents with  . Consult    No current complaints currently, had issues 6 weeks ago with shortness of breath while talking    Reason for Visit: New consult for shortness of breath  [name] is a 80 year old female never smoker with HTN, DM2, hypothyroidism, CKD stage 3a and history of breast cancer who presents with shortness of breath.  She was seen by her PCP on 07/16/20. Fax notes reviewed. She was referred to Pulmonary for PFTs. She had COVID-19 infection in 03/2019 that was treated at home. Declined vaccination status.   In April 2022 she underwent general anesthesia for scar revision for her right mastectomy. She noticed needing to catch her breath while talking. She has to clear her throat often. Her activity dropped significantly after the surgery and she initially fatigued. No wheezing or coughing. She reports sinus drainage that will occasional cause phlegm production. She does not feel like her symptoms are not bad. She is now walking 2-3 times a week for one hour at a leisurely pace with her cane. She feels she is slowly returning to baseline.  Social History: Never smoker Previously Dentist at [place]  I have personally reviewed patient's past medical/family/social history, allergies, current medications.  Past Medical History:  Diagnosis Date  . Anemia   . Arthritis of back   . Arthritis of hand    per patient right hand  . Autoimmune deficiency syndrome ([place])    per patient  . Breast cancer ([place])   . Cancer ([place]) 08/2019   right breast ILC  . Cirrhosis ([place])   . Cirrhosis of liver ([place])   . Complication of anesthesia    per patient, slow to awake  . Diabetes mellitus   . GERD (gastroesophageal reflux disease)    Also, Hiatal Hernia   . H/O migraine   . Hepatitis    per patient, had infectious  hepatitis as a 80 year old  . Hyperlipidemia   . Hypertension   . Hypothyroidism   . Splenic artery aneurysm ([facility])      Family History  Problem Relation Age of Onset  . Diabetes Mother   . Heart disease Mother   . Hypertension Mother   . Breast cancer Mother   . Heart disease Father   . Heart attack Father   . Prostate cancer Father   . Hypertension Sister   . Varicose Veins Sister   . Hyperlipidemia Sister   . Colon cancer Sister   . Heart disease Brother   . Diabetes Brother   . Hypertension Brother   . Prostate cancer Brother   . Cirrhosis Brother   . Hypertension Brother   . Hypertension Daughter   . Hypertension Son   . Liver cancer Neg Hx   . Osteoporosis Neg Hx      Social History   Occupational History  . Occupation: retired  Tobacco Use  . Smoking status: Never Smoker  . Smokeless tobacco: Never Used  Vaping Use  . Vaping Use: Never used  Substance and Sexual Activity  . Alcohol use: No  . Drug use: No  . Sexual activity: Not on file    Allergies  Allergen Reactions  . Demerol Anaphylaxis  . Meperidine Anaphylaxis  . Meperidine Hcl Other (See Comments)     Outpatient Medications Prior to Visit  Medication Sig Dispense Refill  . calcium carbonate (OS-CAL) 600 MG TABS tablet Take 600 mg by mouth daily with breakfast.    . cholecalciferol (VITAMIN D3) 25 MCG (1000 UNIT) tablet Take 1,000 Units by mouth daily.    Marland Kitchen CINNAMON PO Take 500 mg by mouth daily.     . Coenzyme Q10 (COQ10) 100 MG CAPS     . fenofibrate 160 MG tablet Take 160 mg by mouth daily.    . folic acid (FOLVITE) 1 MG tablet Take 1 tablet (1 mg total) by mouth daily. 90 tablet 3  . Ginkgo Biloba 60 MG CAPS Take 60 mg by mouth daily.    . Glucose Blood (ONETOUCH ULTRA BLUE VI) daily.    Marland Kitchen ibuprofen (ADVIL) 800 MG tablet Take 1 tablet (800 mg total) by mouth every 8 (eight) hours as needed. 30 tablet 0  . letrozole (FEMARA) 2.5 MG tablet Take 1 tablet (2.5 mg total) by mouth daily. 90  tablet 3  . levothyroxine (SYNTHROID) 112 MCG tablet Take 112 mcg by mouth daily before breakfast.    . losartan (COZAAR) 25 MG tablet Take 12.5 mg by mouth daily.     . metFORMIN (GLUCOPHAGE) 500 MG tablet Take 500 mg by mouth daily with breakfast.    . Omega-3 Fatty Acids (FISH OIL) 600 MG CAPS Take 600 mg by mouth daily.    Marland Kitchen omeprazole (PRILOSEC) 20 MG capsule Take 20 mg by mouth daily as needed (acid reflux).     . pioglitazone (ACTOS) 15 MG tablet Take 15 mg by mouth daily.    . simvastatin (ZOCOR) 20 MG tablet Take 20 mg by mouth every evening.    . vitamin B-12 (CYANOCOBALAMIN) 1000 MCG tablet Take 1,000 mcg by mouth daily.    Marland Kitchen oxyCODONE (OXY IR/ROXICODONE) 5 MG immediate release tablet Take 1 tablet (5 mg total) by mouth every 6 (six) hours as needed for severe pain. (Patient not taking: No sig reported) 15 tablet 0   No facility-administered medications prior to visit.    Review of Systems  Constitutional: Negative for chills, diaphoresis, fever, malaise/fatigue and weight loss.  HENT: Negative for congestion, ear pain and sore throat.   Respiratory: Positive for cough and sputum production. Negative for hemoptysis, shortness of breath and wheezing.   Cardiovascular: Negative for chest pain, palpitations and leg swelling.  Gastrointestinal: Negative for abdominal pain, heartburn and nausea.  Genitourinary: Negative for frequency.  Musculoskeletal: Negative for joint pain and myalgias.  Skin: Negative for itching and rash.  Neurological: Negative for dizziness, weakness and headaches.  Endo/Heme/Allergies: Does not bruise/bleed easily.  Psychiatric/Behavioral: Negative for depression. The patient is not nervous/anxious.      Objective:   Vitals:   08/08/20 0913  BP: 126/62  Pulse: 78  Temp: 98.1 F (36.7 C)  TempSrc: Temporal  SpO2: 97%  Weight: 173 lb 9.6 oz (78.7 kg)  Height: 5' 4"  (1.626 m)   SpO2: 97 % (Room air) O2 Device: None (Room air)  Physical  Exam: General: Well-appearing, no acute distress HENT: [place], AT Eyes: EOMI, no scleral icterus Respiratory: Clear to auscultation bilaterally.  No crackles, wheezing or rales Cardiovascular: RRR, -M/R/G, no JVD Extremities:-Edema,-tenderness Neuro: AAO x4, CNII-XII grossly intact Skin: Intact, no rashes or bruising Psych: Normal mood, normal affect  Data Reviewed:  Imaging: CT CAP 02/28/20 - No parenchymal abnormalities noted. No reticulation, groundglass or fibrosis. Linear scare in anterilateral LLL. Calcified subcarinal and hilar lymph nodes. Subpleural 69m lymph node in minor fissue.  Calcified RLL granuloma. No evidence of metastatic disease  PFT: None on file  Labs: CBC    Component Value Date/Time   WBC 2.6 (L) 07/19/2020 0759   WBC 3.3 (L) 09/08/2019 0256   RBC 3.65 (L) 07/19/2020 0759   HGB 11.5 (L) 07/19/2020 0759   HCT 35.3 (L) 07/19/2020 0759   PLT 103 (L) 07/19/2020 0759   MCV 96.7 07/19/2020 0759   MCH 31.5 07/19/2020 0759   MCHC 32.6 07/19/2020 0759   RDW 13.2 07/19/2020 0759   LYMPHSABS 0.7 07/19/2020 0759   MONOABS 0.2 07/19/2020 0759   EOSABS 0.2 07/19/2020 0759   BASOSABS 0.0 07/19/2020 0759   BMET    Component Value Date/Time   NA 143 07/19/2020 0759   K 4.0 07/19/2020 0759   CL 110 07/19/2020 0759   CO2 25 07/19/2020 0759   GLUCOSE 129 (H) 07/19/2020 0759   BUN 21 07/19/2020 0759   CREATININE 1.21 (H) 07/19/2020 0759   CREATININE 0.85 06/01/2013 1648   CALCIUM 9.5 07/19/2020 0759   GFRNONAA 46 (L) 07/19/2020 0759   GFRNONAA 69 06/01/2013 1648   GFRAA 52 (L) 10/05/2019 1103   GFRAA 80 06/01/2013 1648   Imaging, labs and test noted above have been reviewed independently by me.    Assessment & Plan:   Discussion: 80 year old never smoker female with prior COVID-19 infection in 03/2019 and recent general anesthesia in 06/2020. Reviewed history and prior imaging. No evidence of fibrosis or inflammation noted on 02/2020. Based on history she is  likely deconditioned after her recent surgery as well as having symptoms of chronic bronchitis. Her symptoms are mild so no indication of bronchodilators at the time however if her PFTs are abnormal, can consider starting SABA as needed.  Chronic bronchitis --ARRANGE pulmonary function tests --If normal, no further work-up indicated --Encourage increasing regular activity to daily  Health Maintenance Immunization History  Administered Date(s) Administered  . Influenza Split 12/22/2008, 01/21/2013  . Influenza, High Dose Seasonal PF 01/16/2020  . Pneumococcal Conjugate-13 11/07/2013  . Pneumococcal Polysaccharide-23 03/09/2007  . Tdap 03/15/2014  . Zoster, Live 12/02/2007   CT Lung Screen - not indicated  Orders Placed This Encounter  Procedures  . Pulmonary Function Test    Standing Status:   Future    Standing Expiration Date:   08/08/2021    Order Specific Question:   Where should this test be performed?    Answer:   [facility]    Order Specific Question:   Full PFT: includes the following: basic spirometry, spirometry pre & post bronchodilator, diffusion capacity (DLCO), lung volumes    Answer:   Full PFT  No orders of the defined types were placed in this encounter.  Return for after PFTs.  I have spent a total time of 45-minutes on the day of the appointment reviewing prior documentation, coordinating care and discussing medical diagnosis and plan with the patient/family. Imaging, labs and tests included in this note have been reviewed and interpreted independently by me.  [place], MD [facility] 08/08/2020 9:46 AM  Office Number [phone]

## 2020-08-17 DIAGNOSIS — M25571 Pain in right ankle and joints of right foot: Secondary | ICD-10-CM | POA: Diagnosis not present

## 2020-08-17 DIAGNOSIS — M12571 Traumatic arthropathy, right ankle and foot: Secondary | ICD-10-CM | POA: Diagnosis not present

## 2020-08-24 ENCOUNTER — Other Ambulatory Visit: Payer: Medicare HMO

## 2020-08-24 ENCOUNTER — Encounter: Payer: Self-pay | Admitting: Neurology

## 2020-08-24 ENCOUNTER — Other Ambulatory Visit (HOSPITAL_COMMUNITY)
Admission: RE | Admit: 2020-08-24 | Discharge: 2020-08-24 | Disposition: A | Discharge status: Home / Self Care | Payer: Medicare HMO | Source: Ambulatory Visit | Attending: Primary Care | Admitting: Primary Care

## 2020-08-24 DIAGNOSIS — Z01812 Encounter for preprocedural laboratory examination: Secondary | ICD-10-CM | POA: Insufficient documentation

## 2020-08-24 DIAGNOSIS — Z20822 Contact with and (suspected) exposure to covid-19: Secondary | ICD-10-CM | POA: Insufficient documentation

## 2020-08-24 LAB — SARS CORONAVIRUS 2 (TAT 6-24 HRS): SARS Coronavirus 2: NEGATIVE

## 2020-08-28 ENCOUNTER — Other Ambulatory Visit: Payer: Self-pay

## 2020-08-28 ENCOUNTER — Ambulatory Visit (INDEPENDENT_AMBULATORY_CARE_PROVIDER_SITE_OTHER): Payer: Medicare HMO | Admitting: Pulmonary Disease

## 2020-08-28 ENCOUNTER — Ambulatory Visit: Payer: Medicare HMO | Admitting: Primary Care

## 2020-08-28 DIAGNOSIS — J42 Unspecified chronic bronchitis: Secondary | ICD-10-CM | POA: Diagnosis not present

## 2020-08-28 DIAGNOSIS — R0602 Shortness of breath: Secondary | ICD-10-CM | POA: Insufficient documentation

## 2020-08-28 LAB — PULMONARY FUNCTION TEST
DL/VA % pred: 101 %
DL/VA: 4.19 ml/min/mmHg/L
DLCO cor % pred: 110 %
DLCO cor: 20.11 ml/min/mmHg
DLCO unc % pred: 110 %
DLCO unc: 20.11 ml/min/mmHg
FEF 25-75 Post: 2.14 L/sec
FEF 25-75 Pre: 1.72 L/sec
FEF2575-%Change-Post: 24 %
FEF2575-%Pred-Post: 147 %
FEF2575-%Pred-Pre: 118 %
FEV1-%Change-Post: 4 %
FEV1-%Pred-Post: 108 %
FEV1-%Pred-Pre: 103 %
FEV1-Post: 2.07 L
FEV1-Pre: 1.99 L
FEV1FVC-%Change-Post: 0 %
FEV1FVC-%Pred-Pre: 104 %
FEV6-%Change-Post: -1 %
FEV6-%Pred-Post: 100 %
FEV6-%Pred-Pre: 102 %
FEV6-Post: 2.45 L
FEV6-Pre: 2.48 L
FEV6FVC-%Change-Post: -1 %
FEV6FVC-%Pred-Post: 100 %
FEV6FVC-%Pred-Pre: 102 %
FVC-%Change-Post: 3 %
FVC-%Pred-Post: 103 %
FVC-%Pred-Pre: 99 %
FVC-Post: 2.66 L
FVC-Pre: 2.56 L
Post FEV1/FVC ratio: 78 %
Post FEV6/FVC ratio: 95 %
Pre FEV1/FVC ratio: 78 %
Pre FEV6/FVC Ratio: 97 %
RV % pred: 134 %
RV: 3.09 L
TLC % pred: 116 %
TLC: 5.72 L

## 2020-08-28 NOTE — Assessment & Plan Note (Addendum)
-   No acute complaints. Hx covid-19 and breast cancer. Shortness of breath noted after scar revision surgery of right breast mastectomy. Her symptoms have improved with addition of Celebrex - PFTs showed no evidence of restriction or obstruction. Normal diffusion capacity.  - No indication for inhaler regimen or further testing  - Follow-up as needed only if shortness of breath symptoms return/ worsen or develops bronchitis symptoms > 2 times a year

## 2020-08-28 NOTE — Patient Instructions (Signed)
Lung function testing today was normal, no evidence of restrictive or obstructive lung disease  No indication for maintenance inhaler therapy or additional testing at this time   Follow-up: Return to our office if breathing were to worsen or if you get bronchitis symptoms >2 times a year

## 2020-08-28 NOTE — Progress Notes (Signed)
@Patient  ID: Catherine Decker, female    DOB: [birth year], 80 y.o.   MRN: [id number]  Chief Complaint  Patient presents with   Follow-up    Reports dyspnea has improved since starting Celebrex and reports of walking easier now.    Referring provider: Antony Contras, MD  HPI: 80 year old female, never smoked. PMH significant for HTN, DM2, hypothyroidism, CKD stage 3, breast cancer, prior covid-19 illness. Patient of Dr. Raylene Miyamoto, seen for initial consult on 08/08/20 for shortness of breath.  Previous [facility] pulmonary enconter: 08/08/20- Dr. Loanne Drilling, Consult  Catherine Decker is a 80 year old female never smoker with HTN, DM2, hypothyroidism, CKD stage 3a and history of breast cancer who presents with shortness of breath.   She was seen by her PCP on 07/16/20. Fax notes reviewed. She was referred to Pulmonary for PFTs. She had COVID-19 infection in 03/2019 that was treated at home. Declined vaccination status.    In April 2022 she underwent general anesthesia for scar revision for her right mastectomy. She noticed needing to catch her breath while talking. She has to clear her throat often. Her activity dropped significantly after the surgery and she initially fatigued. No wheezing or coughing. She reports sinus drainage that will occasional cause phlegm production. She does not feel like her symptoms are not bad. She is now walking 2-3 times a week for one hour at a leisurely pace with her cane. She feels she is slowly returning to baseline.   Social History: Never smoker Previously Dentist at [place]   I have personally reviewed patient's past medical/family/social history, allergies, current medications.   08/28/2020- Interim hx  Patient presents today for 1 month follow-up/review PFTs. She is doing well, she has no new complaints. States that he breathing has improved since starting celebrex. PFTs today showed no evidence of obstructive or restrictive lung disease.    Pulmonary function testing: 08/28/2020- FVC 2.66 (103%), FEV1 2.07 (108%), ratio 78, TLC 116%, DLCOunc 20.11 (110%)     Allergies  Allergen Reactions   Demerol Anaphylaxis   Meperidine Anaphylaxis   Meperidine Hcl Other (See Comments)    Immunization History  Administered Date(s) Administered   Influenza Split 12/22/2008, 01/21/2013   Influenza, High Dose Seasonal PF 01/16/2020   Pneumococcal Conjugate-13 11/07/2013   Pneumococcal Polysaccharide-23 03/09/2007   Tdap 03/15/2014   Zoster, Live 12/02/2007    Past Medical History:  Diagnosis Date   Anemia    Arthritis of back    Arthritis of hand    per patient right hand   Autoimmune deficiency syndrome ([place])    per patient   Breast cancer ([place])    Cancer ([place]) 08/2019   right breast ILC   Cirrhosis ([place])    Cirrhosis of liver ([facility])    Complication of anesthesia    per patient, slow to awake   Diabetes mellitus    GERD (gastroesophageal reflux disease)    Also, Hiatal Hernia    H/O migraine    Hepatitis    per patient, had infectious hepatitis as a 80 year old   Hyperlipidemia    Hypertension    Hypothyroidism    Splenic artery aneurysm ([place])     Tobacco History: Social History   Tobacco Use  Smoking Status Never  Smokeless Tobacco Never   Counseling given: Not Answered   Outpatient Medications Prior to Visit  Medication Sig Dispense Refill   calcium carbonate (OS-CAL) 600 MG TABS tablet Take 600 mg by  mouth daily with breakfast.     celecoxib (CELEBREX) 200 MG capsule Take 200 mg by mouth 2 (two) times daily.     cholecalciferol (VITAMIN D3) 25 MCG (1000 UNIT) tablet Take 1,000 Units by mouth daily.     CINNAMON PO Take 500 mg by mouth daily.      Coenzyme Q10 (COQ10) 100 MG CAPS      fenofibrate 160 MG tablet Take 160 mg by mouth daily.     folic acid (FOLVITE) 1 MG tablet Take 1 tablet (1 mg total) by mouth daily. 90 tablet 3   Ginkgo Biloba 60 MG CAPS Take 60 mg by mouth daily.     Glucose  Blood (ONETOUCH ULTRA BLUE VI) daily.     letrozole (FEMARA) 2.5 MG tablet Take 1 tablet (2.5 mg total) by mouth daily. 90 tablet 3   levothyroxine (SYNTHROID) 112 MCG tablet Take 112 mcg by mouth daily before breakfast.     losartan (COZAAR) 25 MG tablet Take 12.5 mg by mouth daily.      metFORMIN (GLUCOPHAGE) 500 MG tablet Take 500 mg by mouth daily with breakfast.     Omega-3 Fatty Acids (FISH OIL) 600 MG CAPS Take 600 mg by mouth daily.     omeprazole (PRILOSEC) 20 MG capsule Take 20 mg by mouth daily as needed (acid reflux).      pioglitazone (ACTOS) 15 MG tablet Take 15 mg by mouth daily.     simvastatin (ZOCOR) 20 MG tablet Take 20 mg by mouth every evening.     vitamin B-12 (CYANOCOBALAMIN) 1000 MCG tablet Take 1,000 mcg by mouth daily.     ibuprofen (ADVIL) 800 MG tablet Take 1 tablet (800 mg total) by mouth every 8 (eight) hours as needed. 30 tablet 0   No facility-administered medications prior to visit.    Review of Systems  Review of Systems  Constitutional: Negative.   HENT: Negative.    Respiratory:  Negative for cough, shortness of breath and wheezing.   Cardiovascular: Negative.     Physical Exam  BP 128/68   Pulse 88   Temp 98.2 F (36.8 C)   Ht 5' 3"  (1.6 m)   Wt 173 lb (78.5 kg)   SpO2 100%   BMI 30.65 kg/m  Physical Exam Constitutional:      Appearance: Normal appearance.  HENT:     Head: Normocephalic and atraumatic.  Cardiovascular:     Rate and Rhythm: Normal rate and regular rhythm.  Pulmonary:     Effort: Pulmonary effort is normal.     Breath sounds: Normal breath sounds. No wheezing, rhonchi or rales.     Comments: CTA Musculoskeletal:     Cervical back: Normal range of motion and neck supple.  Neurological:     General: No focal deficit present.     Mental Status: She is alert and oriented to person, place, and time. Mental status is at baseline.  Psychiatric:        Mood and Affect: Mood normal.        Behavior: Behavior normal.         Thought Content: Thought content normal.        Judgment: Judgment normal.     Lab Results:  CBC    Component Value Date/Time   WBC 2.6 (L) 07/19/2020 0759   WBC 3.3 (L) 09/08/2019 0256   RBC 3.65 (L) 07/19/2020 0759   HGB 11.5 (L) 07/19/2020 0759   HCT 35.3 (L) 07/19/2020 0786  PLT 103 (L) 07/19/2020 0759   MCV 96.7 07/19/2020 0759   MCH 31.5 07/19/2020 0759   MCHC 32.6 07/19/2020 0759   RDW 13.2 07/19/2020 0759   LYMPHSABS 0.7 07/19/2020 0759   MONOABS 0.2 07/19/2020 0759   EOSABS 0.2 07/19/2020 0759   BASOSABS 0.0 07/19/2020 0759    BMET    Component Value Date/Time   NA 143 07/19/2020 0759   K 4.0 07/19/2020 0759   CL 110 07/19/2020 0759   CO2 25 07/19/2020 0759   GLUCOSE 129 (H) 07/19/2020 0759   BUN 21 07/19/2020 0759   CREATININE 1.21 (H) 07/19/2020 0759   CREATININE 0.85 06/01/2013 1648   CALCIUM 9.5 07/19/2020 0759   GFRNONAA 46 (L) 07/19/2020 0759   GFRNONAA 69 06/01/2013 1648   GFRAA 52 (L) 10/05/2019 1103   GFRAA 80 06/01/2013 1648    BNP No results found for: BNP  ProBNP No results found for: PROBNP  Imaging: No results found.   Assessment & Plan:   Shortness of breath - No acute complaints. Hx covid-19 and breast cancer. Shortness of breath noted after scar revision surgery of right breast mastectomy. Her symptoms have improved with addition of Celebrex - PFTs showed no evidence of restriction or obstruction. Normal diffusion capacity.  - No indication for inhaler regimen or further testing  - Follow-up as needed only if shortness of breath symptoms return/ worsen or develops bronchitis symptoms > 2 times a year      Martyn Ehrich, NP 08/28/2020

## 2020-08-28 NOTE — Progress Notes (Signed)
Full PFT completed today ? ?

## 2020-09-03 NOTE — Progress Notes (Signed)
Assessment/Plan:   1.  Essential Tremor.  -This is evidenced by the symmetrical nature and longstanding hx of gradually getting worse.  We discussed nature and pathophysiology.  We discussed that this can continue to gradually get worse with time.  We discussed that some medications can worsen this, as can caffeine use.  We discussed medication therapy as well as surgical therapy.  Ultimately, after some discussion, I really did not recommend medication for her.  She has a history of chronic cirrhosis and her tremor is not that bothersome to her.  I worried that adding more medication was just going to create problems for her.  She ultimately did not disagree.  Certainly, if tremor becomes worse we can readdress this.  2.  Chronic thrombocytopenia, leukopenia, lymphopenia with history of recurrent breast cancer  -Following with hematology.  On letrozole     Subjective:   Catherine Decker was seen today in the movement disorders clinic for neurologic consultation at the request of Antony Contras, MD.  The consultation is for the evaluation of tremor.  Tremor started in childhood and involves the bilateral UE.  Tremor is most noticeable when doing fine motor tasks.  She is R hand dominant.  Both hands shake equally.    Hands got worse in tremor after mastectomy in June.   There is a family hx of tremor.  Patient was worried that tremor was potentially a precursor to dementia (per records), and also worried because her brother and father had Parkinson's.  Medical records made available to me are reviewed.  Tremor: Yes.     Fam hx of tremor?  Yes.   brother and father with Parkinson's disease.  Son with essential tremor (on beta-blocker)  Located where?  Bilateral UE  Affected by caffeine:  No.  Affected by alcohol:  doesn't drink any  Affected by stress:  Yes.    Affected by fatigue:  No.  Spills soup if on spoon:  No.  Spills glass of liquid if full:  No.  Affects ADL's (tying shoes, brushing  teeth, etc):  No.  Tremor inducing meds:  No.  Other Specific Symptoms:  Voice: little more raspy Sleep: gets up to use RR  Vivid Dreams:  Yes.   Wet Pillows: No. Postural symptoms:  some trouble intermittently  Falls?  Yes.  , one about a year ago walking dog and tripped on curb Bradykinesia symptoms: difficulty getting out of a chair (much better since put on celebrex for arthritis - was having trouble walking up stairs prior to this) Loss of smell:  No. Loss of taste:  No. Urinary Incontinence:  occasionally; wears pad Difficulty Swallowing:  No. Handwriting, micrographia: No. Depression:  No. Memory changes:  No. N/V:  No. Lightheaded:  No.  Syncope: No. Diplopia:  No. Dyskinesia:  No.  Neuroimaging of the brain has not previously been performed.    ALLERGIES:   Allergies  Allergen Reactions   Demerol Anaphylaxis   Meperidine Anaphylaxis   Meperidine Hcl Other (See Comments)    CURRENT MEDICATIONS:  Current Outpatient Medications  Medication Instructions   calcium carbonate (OS-CAL) 600 mg, Oral, Daily with breakfast   celecoxib (CELEBREX) 200 mg, Oral, 2 times daily   cholecalciferol (VITAMIN D3) 1,000 Units, Oral, Daily   CINNAMON PO 500 mg, Oral, Daily   Coenzyme Q10 (COQ10) 100 MG CAPS No dose, route, or frequency recorded.   fenofibrate 160 mg, Oral, Daily   Fish Oil 600 mg, Oral, Daily   folic  acid (FOLVITE) 1 mg, Oral, Daily   Ginkgo Biloba 60 mg, Oral, Daily   Glucose Blood (ONETOUCH ULTRA BLUE VI) Daily   ibuprofen (ADVIL) 800 mg, Oral, Every 8 hours PRN   letrozole (FEMARA) 2.5 mg, Oral, Daily   levothyroxine (SYNTHROID) 112 mcg, Oral, Daily before breakfast   losartan (COZAAR) 12.5 mg, Oral, Daily   metFORMIN (GLUCOPHAGE) 500 mg, Oral, Daily with breakfast   omeprazole (PRILOSEC) 20 mg, Oral, Daily PRN   pioglitazone (ACTOS) 15 mg, Oral, Daily   simvastatin (ZOCOR) 20 mg, Oral, Every evening   vitamin B-12 (CYANOCOBALAMIN) 1,000 mcg, Oral, Daily     Objective:   PHYSICAL EXAMINATION:    VITALS:   Vitals:   09/05/20 0831  BP: 124/66  Pulse: 72  SpO2: 96%  Weight: 177 lb (80.3 kg)  Height: 5' 3"  (1.6 m)    GEN:  The patient appears stated age and is in NAD. HEENT:  Normocephalic, atraumatic.  The mucous membranes are moist. The superficial temporal arteries are without ropiness or tenderness. CV:  RRR Lungs:  CTAB Neck/HEME:  There are no carotid bruits bilaterally.  Neurological examination:  Orientation: The patient is alert and oriented x3.  Cranial nerves: There is good facial symmetry.  Extraocular muscles are intact. The visual fields are full to confrontational testing. The speech is fluent and clear. Soft palate rises symmetrically and there is no tongue deviation. Hearing is intact to conversational tone. Sensation: Sensation is intact to light touch throughout (facial, trunk, extremities). Vibration is intact at the bilateral big toe. There is no extinction with double simultaneous stimulation.  Motor: Strength is 5/5 in the bilateral upper and lower extremities.   Shoulder shrug is equal and symmetric.  There is no pronator drift. Deep tendon reflexes: Deep tendon reflexes are 2/4 at the bilateral biceps, triceps, brachioradialis, patella and achilles. Plantar responses are downgoing bilaterally.  Movement examination: Tone: There is normal tone in the bilateral upper extremities.  The tone in the lower extremities is normal.  Abnormal movements: There is no rest tremor.  There is postural tremor that is fairly mild, left greater than right.  There is intention tremor, left greater than right.  She has mild trouble with Archimedes spirals, left greater than right.  Tremor is most evident when she tries to pour water from one glass to another. Coordination:  There is no decremation with RAM's, with any form of RAMS, including alternating supination and pronation of the forearm, hand opening and closing, finger taps,  heel taps and toe taps. Gait and Station: The patient pushes off to arise.  Gait is wide based and she is antalgic with the R leg (states due to arthritis, and actually states that it is much better than it was due to adding Celebrex -states she was walking on a walker).   I have reviewed and interpreted the following labs independently   Chemistry      Component Value Date/Time   NA 143 07/19/2020 0759   K 4.0 07/19/2020 0759   CL 110 07/19/2020 0759   CO2 25 07/19/2020 0759   BUN 21 07/19/2020 0759   CREATININE 1.21 (H) 07/19/2020 0759   CREATININE 0.85 06/01/2013 1648      Component Value Date/Time   CALCIUM 9.5 07/19/2020 0759   ALKPHOS 49 07/19/2020 0759   AST 33 07/19/2020 0759   ALT 12 07/19/2020 0759   BILITOT 0.5 07/19/2020 0759      Lab Results  Component Value Date  TSH 0.296 (L) 03/31/2019   Lab Results  Component Value Date   WBC 2.6 (L) 07/19/2020   HGB 11.5 (L) 07/19/2020   HCT 35.3 (L) 07/19/2020   MCV 96.7 07/19/2020   PLT 103 (L) 07/19/2020      Total time spent on today's visit was 58 including both face-to-face time and nonface-to-face time.  Time included that spent on review of records (prior notes available to me/labs/imaging if pertinent), discussing treatment and goals, answering patient's questions and coordinating care.  Cc:  Antony Contras, MD

## 2020-09-05 ENCOUNTER — Encounter: Payer: Self-pay | Admitting: Neurology

## 2020-09-05 ENCOUNTER — Other Ambulatory Visit: Payer: Self-pay

## 2020-09-05 ENCOUNTER — Ambulatory Visit: Payer: Medicare HMO | Admitting: Neurology

## 2020-09-05 VITALS — BP 124/66 | HR 72 | Ht 63.0 in | Wt 177.0 lb

## 2020-09-05 DIAGNOSIS — G250 Essential tremor: Secondary | ICD-10-CM | POA: Diagnosis not present

## 2020-09-05 NOTE — Patient Instructions (Signed)
Essential Tremor A tremor is trembling or shaking that a person cannot control. Most tremors affect the hands or arms. Tremors can also affect the head, vocal cords, legs, and other parts of the body. Essential tremor is a tremor without a known cause. Usually, it occurs while a person is trying to perform an action. Ittends to get worse gradually as a person ages. What are the causes? The cause of this condition is not known. What increases the risk? You are more likely to develop this condition if: You have a family member with essential tremor. You are age 48 or older. You take certain medicines. What are the signs or symptoms? The main sign of a tremor is a rhythmic shaking of certain parts of your body that is uncontrolled and unintentional. You may: Have difficulty eating with a spoon or fork. Have difficulty writing. Nod your head up and down or side to side. Have a quivering voice. The shaking may: Get worse over time. Come and go. Be more noticeable on one side of your body. Get worse due to stress, fatigue, caffeine, and extreme heat or cold. How is this diagnosed? This condition may be diagnosed based on: Your symptoms and medical history. A physical exam. There is no single test to diagnose an essential tremor. However, your health care provider may order tests to rule out other causes of your condition. These may include: Blood and urine tests. Imaging studies of your brain, such as CT scan and MRI. A test that measures involuntary muscle movement (electromyogram). How is this treated? Treatment for essential tremor depends on the severity of the condition. Some tremors may go away without treatment. Mild tremors may not need treatment if they do not affect your day-to-day life. Severe tremors may need to be treated using one or more of the following options: Medicines. Lifestyle changes. Occupational or physical therapy. Follow these instructions at  home: Lifestyle  Do not use any products that contain nicotine or tobacco, such as cigarettes and e-cigarettes. If you need help quitting, ask your health care provider. Limit your caffeine intake as told by your health care provider. Try to get 8 hours of sleep each night. Find ways to manage your stress that fits your lifestyle and personality. Consider trying meditation or yoga. Try to anticipate stressful situations and allow extra time to manage them. If you are struggling emotionally with the effects of your tremor, consider working with a mental health provider.  General instructions Take over-the-counter and prescription medicines only as told by your health care provider. Avoid extreme heat and extreme cold. Keep all follow-up visits as told by your health care provider. This is important. Visits may include physical therapy visits. Contact a health care provider if: You experience any changes in the location or intensity of your tremors. You start having a tremor after starting a new medicine. You have tremor with other symptoms, such as: Numbness. Tingling. Pain. Weakness. Your tremor gets worse. Your tremor interferes with your daily life. You feel down, blue, or sad for at least 2 weeks in a row. Worrying about your tremor and what other people think about you interferes with your everyday life functions, including relationships, work, or school. Summary Essential tremor is a tremor without a known cause. Usually, it occurs when you are trying to perform an action. You are more likely to develop this condition if you have a family member with essential tremor. The main sign of a tremor is a rhythmic shaking of  certain parts of your body that is uncontrolled and unintentional. Treatment for essential tremor depends on the severity of the condition. This information is not intended to replace advice given to you by your health care provider. Make sure you discuss any  questions you have with your healthcare provider. Document Revised: 11/18/2019 Document Reviewed: 11/18/2019 Elsevier Patient Education  2022 [employer].

## 2020-10-17 DIAGNOSIS — M12571 Traumatic arthropathy, right ankle and foot: Secondary | ICD-10-CM | POA: Diagnosis not present

## 2020-11-05 ENCOUNTER — Ambulatory Visit (INDEPENDENT_AMBULATORY_CARE_PROVIDER_SITE_OTHER): Payer: Medicare HMO | Admitting: Endocrinology

## 2020-11-05 ENCOUNTER — Other Ambulatory Visit: Payer: Self-pay

## 2020-11-05 VITALS — BP 116/40 | HR 68 | Ht 63.0 in | Wt 174.8 lb

## 2020-11-05 DIAGNOSIS — E119 Type 2 diabetes mellitus without complications: Secondary | ICD-10-CM | POA: Insufficient documentation

## 2020-11-05 DIAGNOSIS — M810 Age-related osteoporosis without current pathological fracture: Secondary | ICD-10-CM | POA: Diagnosis not present

## 2020-11-05 LAB — VITAMIN D 25 HYDROXY (VIT D DEFICIENCY, FRACTURES): VITD: 35.76 ng/mL (ref 30.00–100.00)

## 2020-11-05 LAB — HEMOGLOBIN A1C: Hgb A1c MFr Bld: 5 % (ref 4.6–6.5)

## 2020-11-05 NOTE — Patient Instructions (Addendum)
Blood tests are requested for you today.  We'll let you know about the results.  Take calcium 1200 mg per day, and vitamin-D, at least 400 units per day.  The test result may say to take more--I'll let you know Please come back for a follow-up appointment in 1 year.

## 2020-11-05 NOTE — Progress Notes (Signed)
Subjective:    Patient ID: Catherine Decker, female    DOB: 10-28-1940, 80 y.o.   MRN: [id number]  HPI Pt returns for f/u of osteoporosis: Dx'ed: 2021 Secondary cause: none Fractures: right tibia (1978-- traumatic).   Past rx: none Current rx: none Last DEXA result (2021): -2.8 LFN Other: she declined Reclast; she has intermitt falls; she has GERD; PTH is low, and Ca++ is normal.   Interval hx: pt states she feels well in general. She takes Vit-D, 1000 units/d.   Pt also requests to recheck A1c.  She takes 2 meds for type 2 DM.  Past Medical History:  Diagnosis Date   Anemia    Arthritis of back    Arthritis of hand    per patient right hand   Autoimmune deficiency syndrome ([place])    per patient   Breast cancer ([place])    Cancer ([place]) 08/2019   right breast ILC   Cirrhosis ([place])    Cirrhosis of liver ([facility])    Complication of anesthesia    per patient, slow to awake   Diabetes mellitus    GERD (gastroesophageal reflux disease)    Also, Hiatal Hernia    H/O migraine    Hepatitis    per patient, had infectious hepatitis as a 80 year old   Hyperlipidemia    Hypertension    Hypothyroidism    Splenic artery aneurysm ([place])     Past Surgical History:  Procedure Laterality Date   APPENDECTOMY     BREAST LUMPECTOMY     Right breast lumpectomy   CATARACT EXTRACTION W/ INTRAOCULAR LENS  IMPLANT, BILATERAL  2018   CHOLECYSTECTOMY  04/2010   MASTECTOMY W/ SENTINEL NODE BIOPSY Right 09/07/2019   Procedure: RIGHT SIMPLE MASTECTOMY WITH SENTINEL LYMPH NODE MAPPING;  Surgeon: Erroll Luna, MD;  Location: [facility];  Service: General;  Laterality: Right;  PEC BLOCK   SCAR REVISION Right 06/21/2020   Procedure: SCAR REVISION OF RIGHT MASTECTOMY;  Surgeon: Erroll Luna, MD;  Location: [facility];  Service: General;  Laterality: Right;   TONSILLECTOMY     TUBAL LIGATION  1975    Social History   Socioeconomic History   Marital status: Widowed    Spouse name: Not on  file   Number of children: 3   Years of education: Not on file   Highest education level: Not on file  Occupational History   Occupation: retired   Occupation: retired    Comment: CNA  Tobacco Use   Smoking status: Never   Smokeless tobacco: Never  Vaping Use   Vaping Use: Never used  Substance and Sexual Activity   Alcohol use: No   Drug use: No   Sexual activity: Not on file  Other Topics Concern   Not on file  Social History Narrative   Lives with a friend.    Right handed    Lives in a two story home    Social Determinants of Health   Financial Resource Strain: Not on file  Food Insecurity: Not on file  Transportation Needs: Not on file  Physical Activity: Not on file  Stress: Not on file  Social Connections: Not on file  Intimate Partner Violence: Not on file    Current Outpatient Medications on File Prior to Visit  Medication Sig Dispense Refill   calcium carbonate (OS-CAL) 600 MG TABS tablet Take 600 mg by mouth daily with breakfast.     celecoxib (CELEBREX) 200 MG capsule Take 200  mg by mouth 2 (two) times daily.     cholecalciferol (VITAMIN D3) 25 MCG (1000 UNIT) tablet Take 1,000 Units by mouth daily.     CINNAMON PO Take 500 mg by mouth daily.      Coenzyme Q10 (COQ10) 100 MG CAPS      fenofibrate 160 MG tablet Take 160 mg by mouth daily.     folic acid (FOLVITE) 1 MG tablet Take 1 tablet (1 mg total) by mouth daily. 90 tablet 3   Ginkgo Biloba 60 MG CAPS Take 60 mg by mouth daily.     Glucose Blood (ONETOUCH ULTRA BLUE VI) daily.     letrozole (FEMARA) 2.5 MG tablet Take 1 tablet (2.5 mg total) by mouth daily. 90 tablet 3   levothyroxine (SYNTHROID) 112 MCG tablet Take 112 mcg by mouth daily before breakfast.     losartan (COZAAR) 25 MG tablet Take 12.5 mg by mouth daily.      metFORMIN (GLUCOPHAGE) 500 MG tablet Take 500 mg by mouth daily with breakfast.     Omega-3 Fatty Acids (FISH OIL) 600 MG CAPS Take 600 mg by mouth daily.     omeprazole (PRILOSEC)  20 MG capsule Take 20 mg by mouth daily as needed (acid reflux).      pioglitazone (ACTOS) 15 MG tablet Take 15 mg by mouth daily.     simvastatin (ZOCOR) 20 MG tablet Take 20 mg by mouth every evening.     vitamin B-12 (CYANOCOBALAMIN) 1000 MCG tablet Take 1,000 mcg by mouth daily.     ibuprofen (ADVIL) 800 MG tablet Take 1 tablet (800 mg total) by mouth every 8 (eight) hours as needed. 30 tablet 0   No current facility-administered medications on file prior to visit.    Allergies  Allergen Reactions   Demerol Anaphylaxis   Meperidine Anaphylaxis   Meperidine Hcl Other (See Comments)    Family History  Problem Relation Age of Onset   Diabetes Mother    Heart disease Mother    Hypertension Mother    Breast cancer Mother    Heart disease Father    Heart attack Father    Prostate cancer Father    Hypertension Sister    Varicose Veins Sister    Hyperlipidemia Sister    Colon cancer Sister    Heart disease Brother    Diabetes Brother    Hypertension Brother    Prostate cancer Brother    Cirrhosis Brother    Hypertension Brother    Hypertension Daughter    Hypertension Son    Liver cancer Neg Hx    Osteoporosis Neg Hx     BP (!) 116/40 (BP Location: Left Arm, Patient Position: Sitting, Cuff Size: Large)   Pulse 68   Ht 5' 3"  (1.6 m)   Wt 174 lb 12.8 oz (79.3 kg)   SpO2 98%   BMI 30.96 kg/m   Review of Systems denies falls.      Objective:   Physical Exam GAIT: normal and steady.     Lab Results  Component Value Date   CREATININE 1.21 (H) 07/19/2020   BUN 21 07/19/2020   NA 143 07/19/2020   K 4.0 07/19/2020   CL 110 07/19/2020   CO2 25 07/19/2020       Assessment & Plan:  Osteoporosis, uncontrolled.  We discussed.  She wants to decline rx for now, pending recheck next year.   Type 2 DM  Patient Instructions  Blood tests are requested for you  today.  We'll let you know about the results.  Take calcium 1200 mg per day, and vitamin-D, at least 400  units per day.  The test result may say to take more--I'll let you know Please come back for a follow-up appointment in 1 year.

## 2020-11-06 LAB — PTH, INTACT AND CALCIUM
Calcium: 9.2 mg/dL (ref 8.6–10.4)
PTH: 12 pg/mL — ABNORMAL LOW (ref 16–77)

## 2020-12-06 DIAGNOSIS — C50919 Malignant neoplasm of unspecified site of unspecified female breast: Secondary | ICD-10-CM | POA: Diagnosis not present

## 2020-12-06 DIAGNOSIS — T148XXA Other injury of unspecified body region, initial encounter: Secondary | ICD-10-CM | POA: Diagnosis not present

## 2020-12-06 DIAGNOSIS — Z23 Encounter for immunization: Secondary | ICD-10-CM | POA: Diagnosis not present

## 2020-12-06 DIAGNOSIS — D696 Thrombocytopenia, unspecified: Secondary | ICD-10-CM | POA: Diagnosis not present

## 2020-12-21 ENCOUNTER — Telehealth: Payer: Self-pay | Admitting: *Deleted

## 2020-12-21 NOTE — Telephone Encounter (Signed)
Received message from pt inquiring about her next appt.  TCT her and spoke with her. Advised that her next appt is 01/21/21 @ 2:30 pm  Pt voiced understanding.

## 2021-01-21 ENCOUNTER — Ambulatory Visit: Payer: Medicare HMO | Admitting: Hematology and Oncology

## 2021-01-21 ENCOUNTER — Other Ambulatory Visit: Payer: Medicare HMO

## 2021-01-21 DIAGNOSIS — C50911 Malignant neoplasm of unspecified site of right female breast: Secondary | ICD-10-CM | POA: Diagnosis not present

## 2021-01-24 ENCOUNTER — Encounter: Payer: Self-pay | Admitting: Hematology and Oncology

## 2021-01-24 ENCOUNTER — Inpatient Hospital Stay: Payer: Medicare HMO | Attending: Hematology and Oncology

## 2021-01-24 ENCOUNTER — Other Ambulatory Visit: Payer: Self-pay

## 2021-01-24 ENCOUNTER — Inpatient Hospital Stay: Payer: Medicare HMO | Admitting: Hematology and Oncology

## 2021-01-24 VITALS — BP 149/57 | HR 75 | Temp 97.8°F | Resp 17 | Wt 177.0 lb

## 2021-01-24 DIAGNOSIS — C50911 Malignant neoplasm of unspecified site of right female breast: Secondary | ICD-10-CM

## 2021-01-24 DIAGNOSIS — Z170 Estrogen receptor positive status [ER+]: Secondary | ICD-10-CM | POA: Diagnosis not present

## 2021-01-24 DIAGNOSIS — D72819 Decreased white blood cell count, unspecified: Secondary | ICD-10-CM | POA: Insufficient documentation

## 2021-01-24 DIAGNOSIS — D696 Thrombocytopenia, unspecified: Secondary | ICD-10-CM | POA: Diagnosis not present

## 2021-01-24 DIAGNOSIS — Z853 Personal history of malignant neoplasm of breast: Secondary | ICD-10-CM | POA: Insufficient documentation

## 2021-01-24 DIAGNOSIS — Z923 Personal history of irradiation: Secondary | ICD-10-CM | POA: Insufficient documentation

## 2021-01-24 LAB — CMP (CANCER CENTER ONLY)
ALT: 14 U/L (ref 0–44)
AST: 35 U/L (ref 15–41)
Albumin: 3.4 g/dL — ABNORMAL LOW (ref 3.5–5.0)
Alkaline Phosphatase: 47 U/L (ref 38–126)
Anion gap: 9 (ref 5–15)
BUN: 26 mg/dL — ABNORMAL HIGH (ref 8–23)
CO2: 21 mmol/L — ABNORMAL LOW (ref 22–32)
Calcium: 8.9 mg/dL (ref 8.9–10.3)
Chloride: 112 mmol/L — ABNORMAL HIGH (ref 98–111)
Creatinine: 1.54 mg/dL — ABNORMAL HIGH (ref 0.44–1.00)
GFR, Estimated: 34 mL/min — ABNORMAL LOW (ref >60)
Glucose, Bld: 114 mg/dL — ABNORMAL HIGH (ref 70–99)
Potassium: 4.7 mmol/L (ref 3.5–5.1)
Sodium: 142 mmol/L (ref 135–145)
Total Bilirubin: 0.9 mg/dL (ref 0.3–1.2)
Total Protein: 6.8 g/dL (ref 6.5–8.1)

## 2021-01-24 LAB — CBC WITH DIFFERENTIAL (CANCER CENTER ONLY)
Abs Immature Granulocytes: 0 10*3/uL (ref 0.00–0.07)
Basophils Absolute: 0 10*3/uL (ref 0.0–0.1)
Basophils Relative: 1 %
Eosinophils Absolute: 0.2 10*3/uL (ref 0.0–0.5)
Eosinophils Relative: 6 %
HCT: 35.4 % — ABNORMAL LOW (ref 36.0–46.0)
Hemoglobin: 11.3 g/dL — ABNORMAL LOW (ref 12.0–15.0)
Immature Granulocytes: 0 %
Lymphocytes Relative: 24 %
Lymphs Abs: 0.6 10*3/uL — ABNORMAL LOW (ref 0.7–4.0)
MCH: 31.7 pg (ref 26.0–34.0)
MCHC: 31.9 g/dL (ref 30.0–36.0)
MCV: 99.2 fL (ref 80.0–100.0)
Monocytes Absolute: 0.2 10*3/uL (ref 0.1–1.0)
Monocytes Relative: 7 %
Neutro Abs: 1.6 10*3/uL — ABNORMAL LOW (ref 1.7–7.7)
Neutrophils Relative %: 62 %
Platelet Count: 78 10*3/uL — ABNORMAL LOW (ref 150–400)
RBC: 3.57 MIL/uL — ABNORMAL LOW (ref 3.87–5.11)
RDW: 13.5 % (ref 11.5–15.5)
WBC Count: 2.5 10*3/uL — ABNORMAL LOW (ref 4.0–10.5)
nRBC: 0 % (ref 0.0–0.2)

## 2021-01-24 NOTE — Progress Notes (Signed)
[facility] [phone]   [phone]  PROGRESS NOTE  Patient Care Team: Antony Contras, MD as PCP - General (Family Medicine) Tat, Eustace Quail, DO as Consulting Physician (Neurology)  Hematological/Oncological History #Leukopenia, Lymphopenia #Thrombocytopenia 1) 05/15/2010: WBC 4.5, Hgb 14.1, Plt 124, MCV 88.5. ANC 2.9, Lymph 1.1. Earliest CBC on record.  2) 01/25/2019: WBC 3.0, Hgb 12.8, Plt 101, MCV 93.3. ANC 1.8, Lymph 0.8, Mono 0.2 3) 03/15/2019: WBC 2.5, Hgb 12.9, Plt 96. MCV 93.6. ANC 1.5, Lymph 0.7, Mono 0.2 4) 03/31/2019: establish care with Dr. Lorenso Courier    #Recurrent Breast Cancer  1) underwent lumpectomy and radiation therapy in 2000 on right breast cancer  2) declined chemotherapy and only received 6 months of tamoxifen therapy 3) 06/16/2019: Mammogram showed a new 1.4 cm mass has been noted in the right breast. Biopsy performed showed 90% ER, 20% PR, and HER2 negative. 4) 07/01/2019: clinic visit scheduled with Breast Surgery  5) 09/07/2019: right simple mastectomy performed with sentinel lymph node mapping. Findings showed an invasive lobular carcinoma 1.4 cm with no evidence of lymph node involvement.   Interval History:  Catherine Decker 80 y.o. female with medical history significant for leukopenia/thrombocytopenia who presents for a follow up visit. The patient's last visit was on 07/19/2020.  In the interim since her last visit she has been compliant with letrozole therapy.  On exam today Catherine Decker reports her primary issue since her last visit has been fatigue.  She reports that her fatigue is about the same as she is taking naps on a daily basis.  She reports her nap typically last about 30 minutes to 1 hour.  She reports that she is concerned that her "metformin is not working".  We did encourage her to continue taking this today.  Her weight has been steady in the interim since her last visit.  She is tolerating letrozole therapy quite well  without any hot flashes, sweats, or joint pain.  Overall she is at her baseline level of health and is willing and able to proceed with letrozole therapy at this time.  She currently denies having any issues with fevers, chills, sweats, nausea, vomiting or diarrhea.  A full 10 point ROS is the blood.  MEDICAL HISTORY:  Past Medical History:  Diagnosis Date   Anemia    Arthritis of back    Arthritis of hand    per patient right hand   Autoimmune deficiency syndrome ([place])    per patient   Breast cancer ([place])    Cancer ([place]) 08/2019   right breast ILC   Cirrhosis ([place])    Cirrhosis of liver ([facility])    Complication of anesthesia    per patient, slow to awake   Diabetes mellitus    GERD (gastroesophageal reflux disease)    Also, Hiatal Hernia    H/O migraine    Hepatitis    per patient, had infectious hepatitis as a 80 year old   Hyperlipidemia    Hypertension    Hypothyroidism    Splenic artery aneurysm ([place])     SURGICAL HISTORY: Past Surgical History:  Procedure Laterality Date   APPENDECTOMY     BREAST LUMPECTOMY     Right breast lumpectomy   CATARACT EXTRACTION W/ INTRAOCULAR LENS  IMPLANT, BILATERAL  2018   CHOLECYSTECTOMY  04/2010   MASTECTOMY W/ SENTINEL NODE BIOPSY Right 09/07/2019   Procedure: RIGHT SIMPLE MASTECTOMY WITH SENTINEL LYMPH NODE MAPPING;  Surgeon: Erroll Luna, MD;  Location: [facility]  OR;  Service: General;  Laterality: Right;  PEC BLOCK   SCAR REVISION Right 06/21/2020   Procedure: SCAR REVISION OF RIGHT MASTECTOMY;  Surgeon: Erroll Luna, MD;  Location: [facility];  Service: General;  Laterality: Right;   TONSILLECTOMY     TUBAL LIGATION  1975    ALLERGIES:  is allergic to demerol, meperidine, and meperidine hcl.  MEDICATIONS:  Current Outpatient Medications  Medication Sig Dispense Refill   calcium carbonate (OS-CAL) 600 MG TABS tablet Take 600 mg by mouth daily with breakfast.     celecoxib (CELEBREX) 200 MG capsule Take 200 mg by  mouth 2 (two) times daily.     cholecalciferol (VITAMIN D3) 25 MCG (1000 UNIT) tablet Take 1,000 Units by mouth daily.     CINNAMON PO Take 500 mg by mouth daily.      Coenzyme Q10 (COQ10) 100 MG CAPS      fenofibrate 160 MG tablet Take 160 mg by mouth daily.     folic acid (FOLVITE) 1 MG tablet Take 1 tablet (1 mg total) by mouth daily. 90 tablet 3   Ginkgo Biloba 60 MG CAPS Take 60 mg by mouth daily.     Glucose Blood (ONETOUCH ULTRA BLUE VI) daily.     ibuprofen (ADVIL) 800 MG tablet Take 1 tablet (800 mg total) by mouth every 8 (eight) hours as needed. 30 tablet 0   letrozole (FEMARA) 2.5 MG tablet Take 1 tablet (2.5 mg total) by mouth daily. 90 tablet 3   levothyroxine (SYNTHROID) 112 MCG tablet Take 112 mcg by mouth daily before breakfast.     losartan (COZAAR) 25 MG tablet Take 12.5 mg by mouth daily.      metFORMIN (GLUCOPHAGE) 500 MG tablet Take 500 mg by mouth daily with breakfast.     Omega-3 Fatty Acids (FISH OIL) 600 MG CAPS Take 600 mg by mouth daily.     omeprazole (PRILOSEC) 20 MG capsule Take 20 mg by mouth daily as needed (acid reflux).      pioglitazone (ACTOS) 15 MG tablet Take 15 mg by mouth daily.     simvastatin (ZOCOR) 20 MG tablet Take 20 mg by mouth every evening.     vitamin B-12 (CYANOCOBALAMIN) 1000 MCG tablet Take 1,000 mcg by mouth daily.     No current facility-administered medications for this visit.    REVIEW OF SYSTEMS:   Constitutional: ( - ) fevers, ( - )  chills , ( - ) night sweats Eyes: ( - ) blurriness of vision, ( - ) double vision, ( - ) watery eyes Ears, nose, mouth, throat, and face: ( - ) mucositis, ( - ) sore throat Respiratory: ( - ) cough, ( - ) dyspnea, ( - ) wheezes Cardiovascular: ( - ) palpitation, ( - ) chest discomfort, ( - ) lower extremity swelling Gastrointestinal:  ( - ) nausea, ( - ) heartburn, ( - ) change in bowel habits Skin: ( - ) abnormal skin rashes Lymphatics: ( - ) new lymphadenopathy, ( - ) easy bruising Neurological:  ( - ) numbness, ( - ) tingling, ( - ) new weaknesses Behavioral/Psych: ( - ) mood change, ( - ) new changes  All other systems were reviewed with the patient and are negative.  PHYSICAL EXAMINATION: ECOG PERFORMANCE STATUS: 1 - Symptomatic but completely ambulatory  Vitals:   01/24/21 1048  BP: (!) 149/57  Pulse: 75  Resp: 17  Temp: 97.8 F (36.6 C)  SpO2: 99%  Filed Weights   01/24/21 1048  Weight: 177 lb (80.3 kg)     GENERAL: well appearing elderly Caucasian female in NAD  SKIN: skin color, texture, turgor are normal, no rashes or significant lesions EYES: conjunctiva are pink and non-injected, sclera clear BREAST: s/p simple mastectomy. No evidence of infection, well healing incision. Drains in place.  LUNGS: clear to auscultation and percussion with normal breathing effort HEART: regular rate & rhythm and no murmurs and no lower extremity edema Musculoskeletal: no cyanosis of digits and no clubbing  PSYCH: alert & oriented x 3, fluent speech NEURO: no focal motor/sensory deficits  LABORATORY DATA:  I have reviewed the data as listed CBC Latest Ref Rng & Units 01/24/2021 07/19/2020 04/20/2020  WBC 4.0 - 10.5 K/uL 2.5(L) 2.6(L) 2.5(L)  Hemoglobin 12.0 - 15.0 g/dL 11.3(L) 11.5(L) 11.9(L)  Hematocrit 36.0 - 46.0 % 35.4(L) 35.3(L) 36.8  Platelets 150 - 400 K/uL 78(L) 103(L) 83(L)    CMP Latest Ref Rng & Units 01/24/2021 11/05/2020 07/19/2020  Glucose 70 - 99 mg/dL 114(H) - 129(H)  BUN 8 - 23 mg/dL 26(H) - 21  Creatinine 0.44 - 1.00 mg/dL 1.54(H) - 1.21(H)  Sodium 135 - 145 mmol/L 142 - 143  Potassium 3.5 - 5.1 mmol/L 4.7 - 4.0  Chloride 98 - 111 mmol/L 112(H) - 110  CO2 22 - 32 mmol/L 21(L) - 25  Calcium 8.9 - 10.3 mg/dL 8.9 9.2 9.5  Total Protein 6.5 - 8.1 g/dL 6.8 - 6.6  Total Bilirubin 0.3 - 1.2 mg/dL 0.9 - 0.5  Alkaline Phos 38 - 126 U/L 47 - 49  AST 15 - 41 U/L 35 - 33  ALT 0 - 44 U/L 14 - 12     RADIOGRAPHIC STUDIES:  No results found.  ASSESSMENT &  PLAN Catherine Decker 80 y.o. female with medical history significant for leukopenia/thrombocytopenia who presents for a follow up visit.  After review the labs discussion with the patient her findings are most consistent with leukopenia and thrombocytopenia due to chronic liver disease.  The patient has not responded to vitamin B12 or folate therapy, but we did wish to assure that her levels of these are replete.  The patient is fortunately already established with the [facility] and therefore these cytopenias could further be followed in their clinic with re-referral in the event that they were to worsen.  In terms of her breast cancer, she was found to have a new 1.5 cm lesion within the right breast on 06/16/2019.  She has already undergone biopsy of this lesion which shows an ER positive, PR positive, HER-2 negative breast cancer.  Surgical resection was performed on 09/07/2019 which revealed invasive lobular carcinoma.  Started on letrozole therapy on 10/11/2019.  On exam today Catherine Decker is at her baseline level of health and is tolerating her letrozole therapy quite well.  She had a CT chest abdomen pelvis on 02/28/2020 which showed no evidence of metastatic disease.   #Bicytopenia: Leukopenia and Thrombocytopenia, stable --most likely etiology at this time is the patient's cirrhosis of the liver.  --patient has been on vitamin b12 and folate in the interim with no remarkable change in her blood counts. --continue to monitor   #Cirrhosis of the Liver --unclear etiology, thought be 2/2 to childhood illness with hepatitis --following with [facility] hepatology for evaluation and [place] screening --continue to monitor    #History of Breast Cancer #Recurrence of Breast Cancer: ER+/PR+/HER2- --underwent lumpectomy and radiation therapy in 2000 --declined chemotherapy  and only received 6 months of tamoxifen therapy --prior mammogram performed in 2018, new imaging performed 06/2019 --A 1.4 cm mass  has been noted in the right breast. Biopsy performed showed 90% ER, 20% PR, and HER2 negative.  --surgical resection of this mass was performed on 09/07/2019 with findings showing invasive lobular carcinoma Nottingham Grade 2 of 3, 2.2 cm. Found to have a deep margin.  No carcinoma noted in lymph nodes.  -- Based on the CALOR Trial ( J Clin Oncol. [phone]) would recommend adjuvant aromatase ihibitor therapy. Patient currently on letrozole 2.27m daily.  --baseline CT C/A/P showed no evidence of metastatic disease. --RTC in 6 months for continued monitoring  #Tremor --patient has a resting tremor and occasional "shakes" --these tremors were no visualized on exam today but reported by the patient --family history of Parkinson's in her father and brother. She is requesting neurology evaluation --will refer to Dr. VMickeal Skinner   Orders Placed This Encounter  Procedures   MM DIAG BREAST TOMO BILATERAL    Standing Status:   Future    Standing Expiration Date:   01/24/2022    Order Specific Question:   Reason for Exam (SYMPTOM  OR DIAGNOSIS REQUIRED)    Answer:   history of breast cancer, due to repeat Mammogram    Order Specific Question:   [facility]    [facility]   All questions were answered. The patient knows to call the clinic with any problems, questions or concerns.  A total of more than 30 minutes were spent on this encounter and over half of that time was spent on counseling and coordination of care as outlined above.   JLedell Peoples MD Department of Hematology/Oncology [place]Phone: [phone]: [phone]: [email or web address]  01/24/2021 1:19 PM   Literature Support:   WDonley RedderIVernie Murders et al. Efficacy of chemotherapy for ER-negative and ER-positive isolated locoregional recurrence of breast cancer: final analysis of the CALOR trial. J Clin Oncol.  23154;00:8676-1  --The final analysis of CALOR confirms that chemtotherapy benefits patients with resected ER-negative ILRR and does not support the use of chemotherapy for ER-positive ILRR.

## 2021-02-21 DIAGNOSIS — Z8616 Personal history of COVID-19: Secondary | ICD-10-CM | POA: Diagnosis not present

## 2021-02-21 DIAGNOSIS — D696 Thrombocytopenia, unspecified: Secondary | ICD-10-CM | POA: Diagnosis not present

## 2021-02-21 DIAGNOSIS — J069 Acute upper respiratory infection, unspecified: Secondary | ICD-10-CM | POA: Diagnosis not present

## 2021-02-21 DIAGNOSIS — Z1389 Encounter for screening for other disorder: Secondary | ICD-10-CM | POA: Diagnosis not present

## 2021-02-21 DIAGNOSIS — N3941 Urge incontinence: Secondary | ICD-10-CM | POA: Diagnosis not present

## 2021-02-21 DIAGNOSIS — E039 Hypothyroidism, unspecified: Secondary | ICD-10-CM | POA: Diagnosis not present

## 2021-02-21 DIAGNOSIS — E1129 Type 2 diabetes mellitus with other diabetic kidney complication: Secondary | ICD-10-CM | POA: Diagnosis not present

## 2021-02-21 DIAGNOSIS — Z1331 Encounter for screening for depression: Secondary | ICD-10-CM | POA: Diagnosis not present

## 2021-02-21 DIAGNOSIS — E559 Vitamin D deficiency, unspecified: Secondary | ICD-10-CM | POA: Diagnosis not present

## 2021-02-21 DIAGNOSIS — E782 Mixed hyperlipidemia: Secondary | ICD-10-CM | POA: Diagnosis not present

## 2021-02-21 DIAGNOSIS — Z0000 Encounter for general adult medical examination without abnormal findings: Secondary | ICD-10-CM | POA: Diagnosis not present

## 2021-02-21 DIAGNOSIS — N1831 Chronic kidney disease, stage 3a: Secondary | ICD-10-CM | POA: Diagnosis not present

## 2021-02-27 ENCOUNTER — Other Ambulatory Visit: Payer: Self-pay | Admitting: Hematology and Oncology

## 2021-02-27 ENCOUNTER — Ambulatory Visit
Admission: RE | Admit: 2021-02-27 | Discharge: 2021-02-27 | Disposition: A | Discharge status: Home / Self Care | Payer: Medicare HMO | Source: Ambulatory Visit | Attending: Hematology and Oncology | Admitting: Hematology and Oncology

## 2021-02-27 DIAGNOSIS — C50911 Malignant neoplasm of unspecified site of right female breast: Secondary | ICD-10-CM

## 2021-02-27 DIAGNOSIS — R922 Inconclusive mammogram: Secondary | ICD-10-CM | POA: Diagnosis not present

## 2021-02-27 IMAGING — MG MM DIGITAL DIAGNOSTIC UNILAT*L* W/ TOMO W/ CAD
4 series · 4 of 12 positions shown · non-contrast
Comparison: Previous exam(s).

CLINICAL DATA: 80-year-old female status post more right mastectomy
in [H0]. History of benign left breast biopsy in [H0].

EXAM:
DIGITAL DIAGNOSTIC UNILATERAL LEFT MAMMOGRAM WITH TOMOSYNTHESIS AND
CAD
TECHNIQUE: Left digital diagnostic mammography and breast tomosynthesis was
performed. The images were evaluated with computer-aided detection.

[L MLO synth-2D]
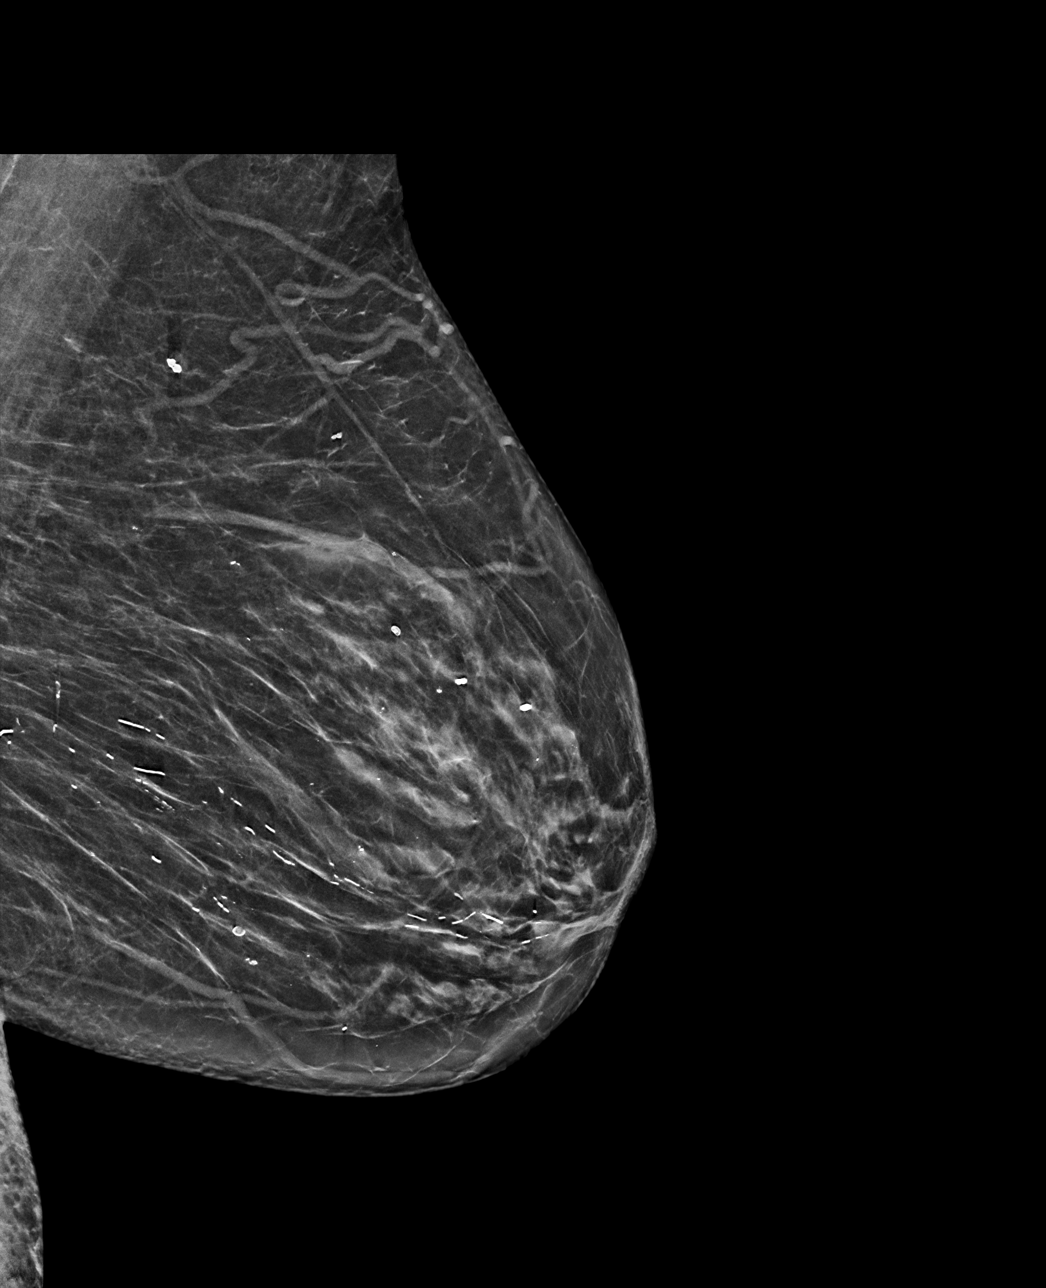

[L CC synth-2D]
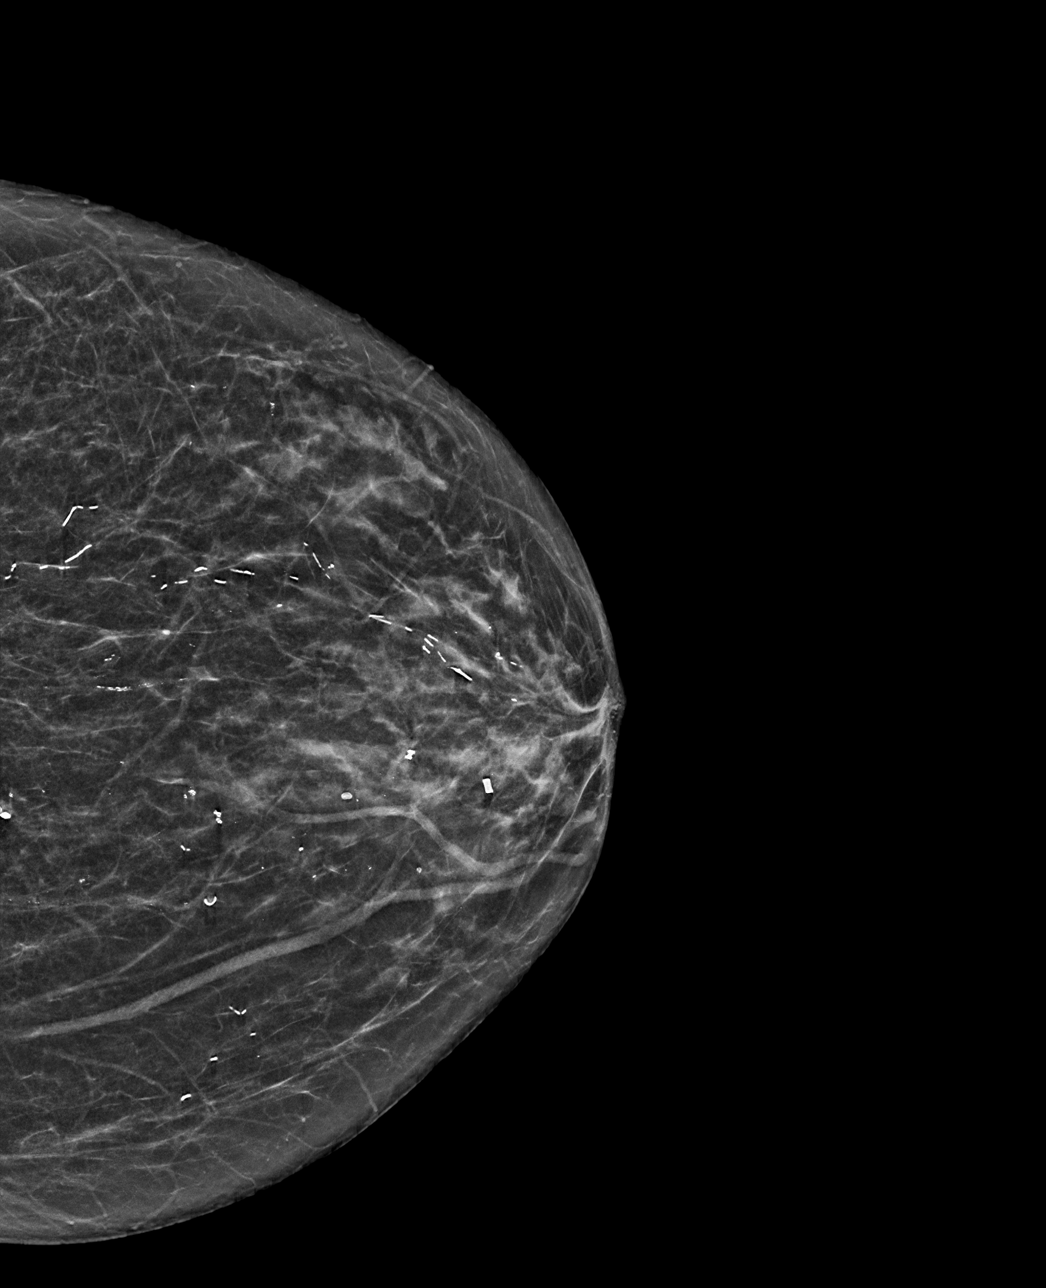

[L MLO tomo · tomo slice 31/62.0]
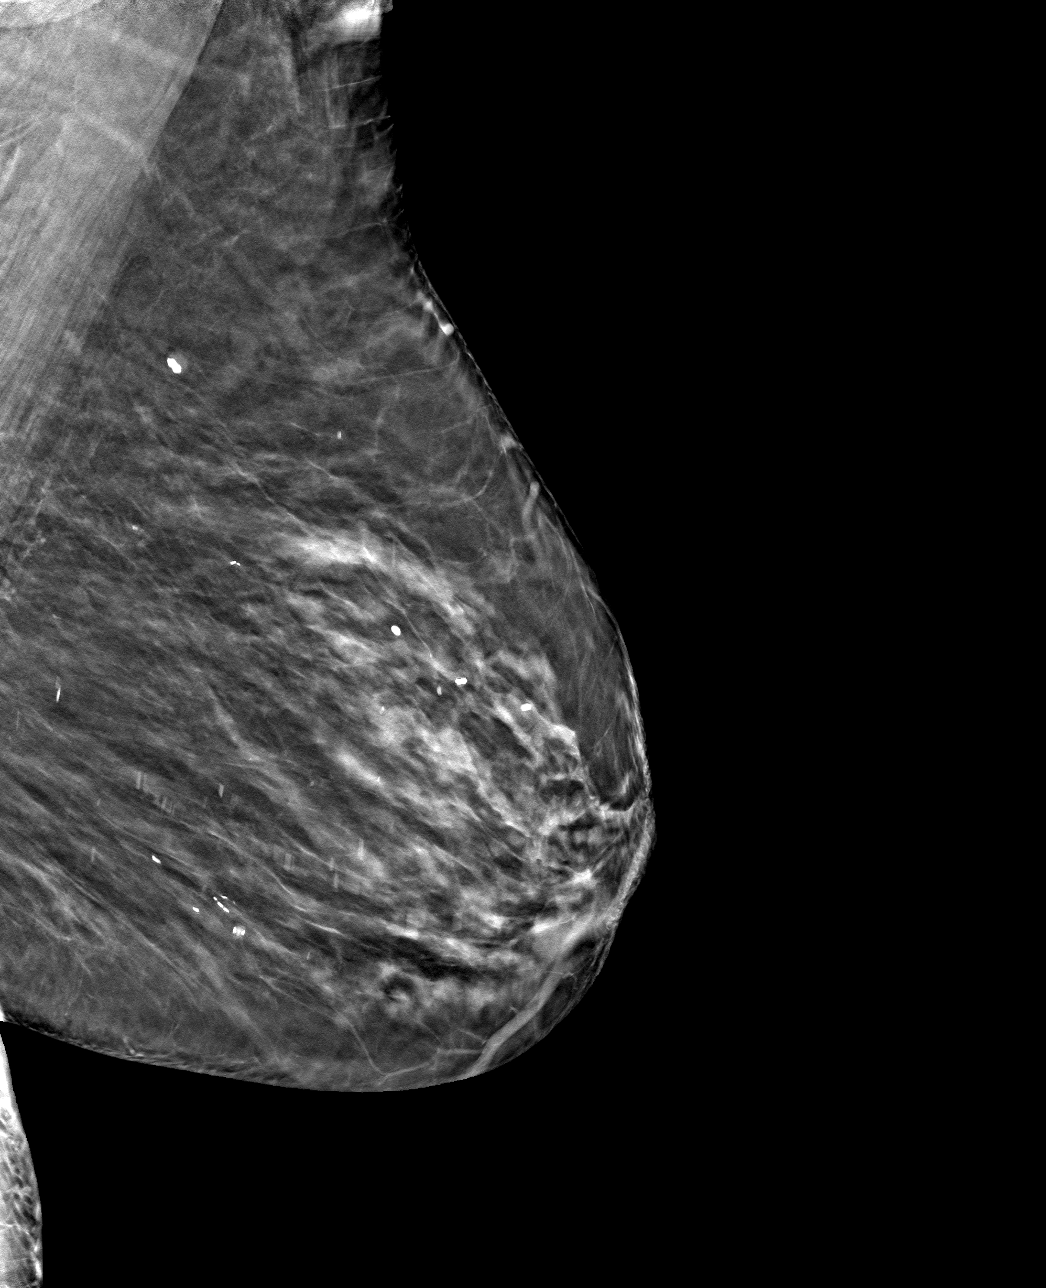

[L CC tomo · tomo slice 26/51.0]
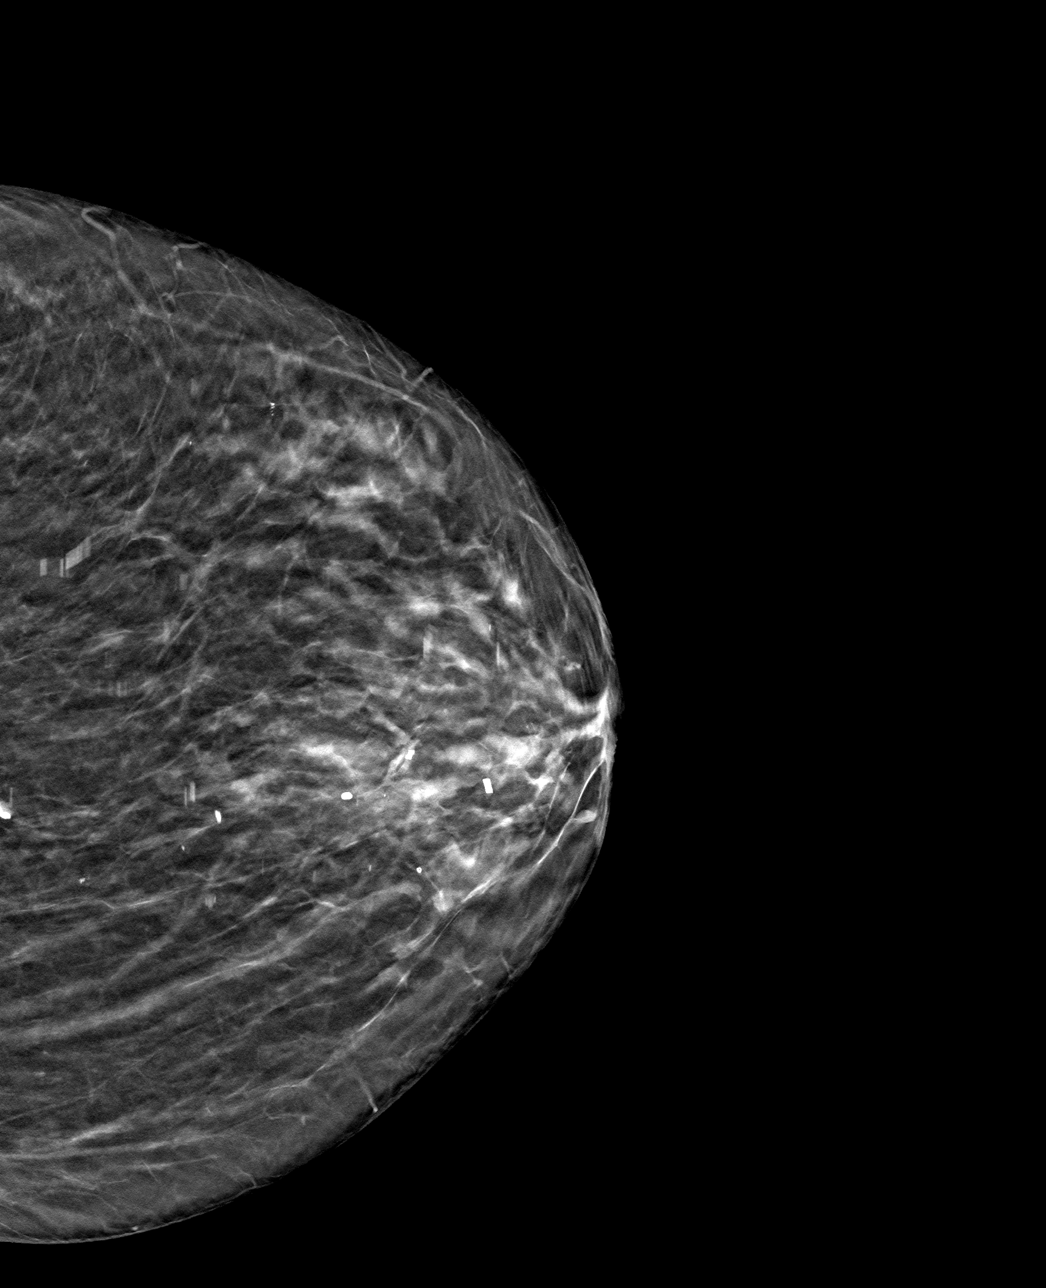

[4 of 12 positions shown; findings below may reference images not displayed]

ACR Breast Density Category b: There are scattered areas of
fibroglandular density.
FINDINGS: No new or suspicious mammographic findings are identified within the
left breast. Note is made of post biopsy changes.
IMPRESSION: No mammographic evidence of malignancy on the left.

RECOMMENDATION:
Left breast screening mammogram in 1 year.

I have discussed the findings and recommendations with the patient.
If applicable, a reminder letter will be sent to the patient
regarding the next appointment.

BI-RADS CATEGORY  2: Benign.

## 2021-04-05 DIAGNOSIS — R351 Nocturia: Secondary | ICD-10-CM | POA: Diagnosis not present

## 2021-04-05 DIAGNOSIS — R350 Frequency of micturition: Secondary | ICD-10-CM | POA: Diagnosis not present

## 2021-04-05 DIAGNOSIS — N3941 Urge incontinence: Secondary | ICD-10-CM | POA: Diagnosis not present

## 2021-04-05 DIAGNOSIS — R3915 Urgency of urination: Secondary | ICD-10-CM | POA: Diagnosis not present

## 2021-04-08 DIAGNOSIS — E039 Hypothyroidism, unspecified: Secondary | ICD-10-CM | POA: Diagnosis not present

## 2021-04-20 ENCOUNTER — Other Ambulatory Visit: Payer: Self-pay | Admitting: Hematology and Oncology

## 2021-04-24 ENCOUNTER — Other Ambulatory Visit: Payer: Self-pay | Admitting: Hematology and Oncology

## 2021-05-11 DIAGNOSIS — M9901 Segmental and somatic dysfunction of cervical region: Secondary | ICD-10-CM | POA: Diagnosis not present

## 2021-05-11 DIAGNOSIS — M9903 Segmental and somatic dysfunction of lumbar region: Secondary | ICD-10-CM | POA: Diagnosis not present

## 2021-05-11 DIAGNOSIS — M5412 Radiculopathy, cervical region: Secondary | ICD-10-CM | POA: Diagnosis not present

## 2021-05-11 DIAGNOSIS — M5431 Sciatica, right side: Secondary | ICD-10-CM | POA: Diagnosis not present

## 2021-05-13 DIAGNOSIS — M9903 Segmental and somatic dysfunction of lumbar region: Secondary | ICD-10-CM | POA: Diagnosis not present

## 2021-05-13 DIAGNOSIS — M9901 Segmental and somatic dysfunction of cervical region: Secondary | ICD-10-CM | POA: Diagnosis not present

## 2021-05-13 DIAGNOSIS — M5412 Radiculopathy, cervical region: Secondary | ICD-10-CM | POA: Diagnosis not present

## 2021-05-14 DIAGNOSIS — M9901 Segmental and somatic dysfunction of cervical region: Secondary | ICD-10-CM | POA: Diagnosis not present

## 2021-05-14 DIAGNOSIS — M9903 Segmental and somatic dysfunction of lumbar region: Secondary | ICD-10-CM | POA: Diagnosis not present

## 2021-05-14 DIAGNOSIS — M5412 Radiculopathy, cervical region: Secondary | ICD-10-CM | POA: Diagnosis not present

## 2021-05-16 DIAGNOSIS — M9903 Segmental and somatic dysfunction of lumbar region: Secondary | ICD-10-CM | POA: Diagnosis not present

## 2021-05-16 DIAGNOSIS — M5412 Radiculopathy, cervical region: Secondary | ICD-10-CM | POA: Diagnosis not present

## 2021-05-16 DIAGNOSIS — M9901 Segmental and somatic dysfunction of cervical region: Secondary | ICD-10-CM | POA: Diagnosis not present

## 2021-05-20 DIAGNOSIS — M5412 Radiculopathy, cervical region: Secondary | ICD-10-CM | POA: Diagnosis not present

## 2021-05-20 DIAGNOSIS — M9903 Segmental and somatic dysfunction of lumbar region: Secondary | ICD-10-CM | POA: Diagnosis not present

## 2021-05-20 DIAGNOSIS — M9901 Segmental and somatic dysfunction of cervical region: Secondary | ICD-10-CM | POA: Diagnosis not present

## 2021-05-21 DIAGNOSIS — M9903 Segmental and somatic dysfunction of lumbar region: Secondary | ICD-10-CM | POA: Diagnosis not present

## 2021-05-21 DIAGNOSIS — M5412 Radiculopathy, cervical region: Secondary | ICD-10-CM | POA: Diagnosis not present

## 2021-05-21 DIAGNOSIS — M9901 Segmental and somatic dysfunction of cervical region: Secondary | ICD-10-CM | POA: Diagnosis not present

## 2021-05-21 DIAGNOSIS — M5431 Sciatica, right side: Secondary | ICD-10-CM | POA: Diagnosis not present

## 2021-05-23 DIAGNOSIS — M9901 Segmental and somatic dysfunction of cervical region: Secondary | ICD-10-CM | POA: Diagnosis not present

## 2021-05-23 DIAGNOSIS — E039 Hypothyroidism, unspecified: Secondary | ICD-10-CM | POA: Diagnosis not present

## 2021-05-23 DIAGNOSIS — M9903 Segmental and somatic dysfunction of lumbar region: Secondary | ICD-10-CM | POA: Diagnosis not present

## 2021-05-23 DIAGNOSIS — M5412 Radiculopathy, cervical region: Secondary | ICD-10-CM | POA: Diagnosis not present

## 2021-05-27 DIAGNOSIS — M5412 Radiculopathy, cervical region: Secondary | ICD-10-CM | POA: Diagnosis not present

## 2021-05-27 DIAGNOSIS — M9901 Segmental and somatic dysfunction of cervical region: Secondary | ICD-10-CM | POA: Diagnosis not present

## 2021-05-27 DIAGNOSIS — M9903 Segmental and somatic dysfunction of lumbar region: Secondary | ICD-10-CM | POA: Diagnosis not present

## 2021-05-28 DIAGNOSIS — M9901 Segmental and somatic dysfunction of cervical region: Secondary | ICD-10-CM | POA: Diagnosis not present

## 2021-05-28 DIAGNOSIS — M9903 Segmental and somatic dysfunction of lumbar region: Secondary | ICD-10-CM | POA: Diagnosis not present

## 2021-05-28 DIAGNOSIS — M5412 Radiculopathy, cervical region: Secondary | ICD-10-CM | POA: Diagnosis not present

## 2021-05-30 DIAGNOSIS — M5412 Radiculopathy, cervical region: Secondary | ICD-10-CM | POA: Diagnosis not present

## 2021-05-30 DIAGNOSIS — M9903 Segmental and somatic dysfunction of lumbar region: Secondary | ICD-10-CM | POA: Diagnosis not present

## 2021-05-30 DIAGNOSIS — M9901 Segmental and somatic dysfunction of cervical region: Secondary | ICD-10-CM | POA: Diagnosis not present

## 2021-06-03 DIAGNOSIS — M9901 Segmental and somatic dysfunction of cervical region: Secondary | ICD-10-CM | POA: Diagnosis not present

## 2021-06-03 DIAGNOSIS — M9903 Segmental and somatic dysfunction of lumbar region: Secondary | ICD-10-CM | POA: Diagnosis not present

## 2021-06-03 DIAGNOSIS — M5412 Radiculopathy, cervical region: Secondary | ICD-10-CM | POA: Diagnosis not present

## 2021-06-04 DIAGNOSIS — M9903 Segmental and somatic dysfunction of lumbar region: Secondary | ICD-10-CM | POA: Diagnosis not present

## 2021-06-04 DIAGNOSIS — M9901 Segmental and somatic dysfunction of cervical region: Secondary | ICD-10-CM | POA: Diagnosis not present

## 2021-06-04 DIAGNOSIS — M5412 Radiculopathy, cervical region: Secondary | ICD-10-CM | POA: Diagnosis not present

## 2021-06-10 DIAGNOSIS — M5431 Sciatica, right side: Secondary | ICD-10-CM | POA: Diagnosis not present

## 2021-06-10 DIAGNOSIS — M9903 Segmental and somatic dysfunction of lumbar region: Secondary | ICD-10-CM | POA: Diagnosis not present

## 2021-06-10 DIAGNOSIS — M9901 Segmental and somatic dysfunction of cervical region: Secondary | ICD-10-CM | POA: Diagnosis not present

## 2021-06-10 DIAGNOSIS — M5412 Radiculopathy, cervical region: Secondary | ICD-10-CM | POA: Diagnosis not present

## 2021-07-03 ENCOUNTER — Telehealth: Payer: Self-pay | Admitting: Hematology and Oncology

## 2021-07-03 NOTE — Telephone Encounter (Signed)
Called patient regarding upcoming appointment, patient is notified. °

## 2021-07-24 ENCOUNTER — Other Ambulatory Visit: Payer: Self-pay

## 2021-07-24 ENCOUNTER — Other Ambulatory Visit: Payer: Self-pay | Admitting: Hematology and Oncology

## 2021-07-24 ENCOUNTER — Inpatient Hospital Stay: Payer: Medicare HMO | Attending: Hematology and Oncology

## 2021-07-24 ENCOUNTER — Inpatient Hospital Stay: Payer: Medicare HMO | Admitting: Hematology and Oncology

## 2021-07-24 DIAGNOSIS — C50911 Malignant neoplasm of unspecified site of right female breast: Secondary | ICD-10-CM | POA: Diagnosis not present

## 2021-07-24 DIAGNOSIS — R251 Tremor, unspecified: Secondary | ICD-10-CM | POA: Diagnosis not present

## 2021-07-24 DIAGNOSIS — Z170 Estrogen receptor positive status [ER+]: Secondary | ICD-10-CM | POA: Insufficient documentation

## 2021-07-24 DIAGNOSIS — D696 Thrombocytopenia, unspecified: Secondary | ICD-10-CM | POA: Diagnosis not present

## 2021-07-24 DIAGNOSIS — Z7984 Long term (current) use of oral hypoglycemic drugs: Secondary | ICD-10-CM | POA: Diagnosis not present

## 2021-07-24 DIAGNOSIS — Z923 Personal history of irradiation: Secondary | ICD-10-CM | POA: Insufficient documentation

## 2021-07-24 DIAGNOSIS — K7460 Unspecified cirrhosis of liver: Secondary | ICD-10-CM | POA: Diagnosis not present

## 2021-07-24 DIAGNOSIS — R300 Dysuria: Secondary | ICD-10-CM

## 2021-07-24 DIAGNOSIS — Z79899 Other long term (current) drug therapy: Secondary | ICD-10-CM | POA: Diagnosis not present

## 2021-07-24 DIAGNOSIS — Z79811 Long term (current) use of aromatase inhibitors: Secondary | ICD-10-CM | POA: Insufficient documentation

## 2021-07-24 LAB — CMP (CANCER CENTER ONLY)
ALT: 17 U/L (ref 0–44)
AST: 44 U/L — ABNORMAL HIGH (ref 15–41)
Albumin: 3.5 g/dL (ref 3.5–5.0)
Alkaline Phosphatase: 54 U/L (ref 38–126)
Anion gap: 6 (ref 5–15)
BUN: 40 mg/dL — ABNORMAL HIGH (ref 8–23)
CO2: 24 mmol/L (ref 22–32)
Calcium: 9.4 mg/dL (ref 8.9–10.3)
Chloride: 110 mmol/L (ref 98–111)
Creatinine: 1.65 mg/dL — ABNORMAL HIGH (ref 0.44–1.00)
GFR, Estimated: 31 mL/min — ABNORMAL LOW (ref >60)
Glucose, Bld: 124 mg/dL — ABNORMAL HIGH (ref 70–99)
Potassium: 4.7 mmol/L (ref 3.5–5.1)
Sodium: 140 mmol/L (ref 135–145)
Total Bilirubin: 1 mg/dL (ref 0.3–1.2)
Total Protein: 7.1 g/dL (ref 6.5–8.1)

## 2021-07-24 LAB — URINALYSIS, COMPLETE (UACMP) WITH MICROSCOPIC
Bilirubin Urine: NEGATIVE
Glucose, UA: NEGATIVE mg/dL
Ketones, ur: NEGATIVE mg/dL
Nitrite: POSITIVE — AB
Protein, ur: 30 mg/dL — AB
Specific Gravity, Urine: 1.017 (ref 1.005–1.030)
WBC, UA: >50 WBC/hpf — ABNORMAL HIGH (ref 0–5)
pH: 5 (ref 5.0–8.0)

## 2021-07-24 LAB — CBC WITH DIFFERENTIAL (CANCER CENTER ONLY)
Abs Immature Granulocytes: 0 10*3/uL (ref 0.00–0.07)
Basophils Absolute: 0 10*3/uL (ref 0.0–0.1)
Basophils Relative: 1 %
Eosinophils Absolute: 0.2 10*3/uL (ref 0.0–0.5)
Eosinophils Relative: 7 %
HCT: 36.1 % (ref 36.0–46.0)
Hemoglobin: 12.1 g/dL (ref 12.0–15.0)
Immature Granulocytes: 0 %
Lymphocytes Relative: 24 %
Lymphs Abs: 0.7 10*3/uL (ref 0.7–4.0)
MCH: 32.7 pg (ref 26.0–34.0)
MCHC: 33.5 g/dL (ref 30.0–36.0)
MCV: 97.6 fL (ref 80.0–100.0)
Monocytes Absolute: 0.2 10*3/uL (ref 0.1–1.0)
Monocytes Relative: 7 %
Neutro Abs: 1.8 10*3/uL (ref 1.7–7.7)
Neutrophils Relative %: 61 %
Platelet Count: 88 10*3/uL — ABNORMAL LOW (ref 150–400)
RBC: 3.7 MIL/uL — ABNORMAL LOW (ref 3.87–5.11)
RDW: 12.8 % (ref 11.5–15.5)
WBC Count: 3 10*3/uL — ABNORMAL LOW (ref 4.0–10.5)
nRBC: 0 % (ref 0.0–0.2)

## 2021-07-24 MED ORDER — SULFAMETHOXAZOLE-TRIMETHOPRIM 800-160 MG PO TABS: 1.0000 | ORAL_TABLET | Freq: Two times a day (BID) | ORAL | 0 refills | Status: DC

## 2021-07-24 NOTE — Progress Notes (Signed)
?[place] ?[phone]   [phone] ? ?PROGRESS NOTE ? ?Patient Care Team: ?Antony Contras, MD as PCP - General (Family Medicine) ?Ludwig Clarks, DO as Consulting Physician (Neurology) ? ?Hematological/Oncological History ?#Leukopenia, Lymphopenia ?#Thrombocytopenia ?1) 05/15/2010: WBC 4.5, Hgb 14.1, Plt 124, MCV 88.5. ANC 2.9, Lymph 1.1. Earliest CBC on record.  ?2) 01/25/2019: WBC 3.0, Hgb 12.8, Plt 101, MCV 93.3. ANC 1.8, Lymph 0.8, Mono 0.2 ?3) 03/15/2019: WBC 2.5, Hgb 12.9, Plt 96. MCV 93.6. ANC 1.5, Lymph 0.7, Mono 0.2 ?4) 03/31/2019: establish care with Dr. Lorenso Courier   ? ?#Recurrent Breast Cancer  ?1) underwent lumpectomy and radiation therapy in 2000 on right breast cancer  ?2) declined chemotherapy and only received 6 months of tamoxifen therapy ?3) 06/16/2019: Mammogram showed a new 1.4 cm mass has been noted in the right breast. Biopsy performed showed 90% ER, 20% PR, and HER2 negative. ?4) 07/01/2019: clinic visit scheduled with Breast Surgery  ?5) 09/07/2019: right simple mastectomy performed with sentinel lymph node mapping. Findings showed an invasive lobular carcinoma 1.4 cm with no evidence of lymph node involvement.  ? ?Interval History:  ?Nicoletta Dress 81 y.o. female with medical history significant for leukopenia/thrombocytopenia who presents for a follow up visit. The patient's last visit was on 01/24/2021.  In the interim since her last visit she has been compliant with letrozole therapy. ? ?On exam today Ms. Geurts reports is having some burning on urination and urgency that just began recently.  She notes that she is not having any change in the frequency, color, or smell of her urine.  She reports otherwise her last 6 months have been good.  She is having some emotional difficulty as her granddaughter is currently on hospice for brain cancer.  She reports that she continues to take her letrozole and is not having any side effects as result of the medication.   She denies any nausea, vomiting, or diarrhea.  She has continued to be up-to-date on her mammograms with the last one being performed in December 2022.  She notes that she is not having any bleeding but does have occasional episodes of bruising.  She lost 7 pounds, mostly because she is feeling depressed over her granddaughter.  Overall she is at her baseline level of health and is willing and able to proceed with letrozole therapy at this time.  She currently denies having any issues with fevers, chills, sweats, nausea, vomiting or diarrhea.  A full 10 point ROS is the blood. ? ?MEDICAL HISTORY:  ?Past Medical History:  ?Diagnosis Date  ? Anemia   ? Arthritis of back   ? Arthritis of hand   ? per patient right hand  ? Autoimmune deficiency syndrome ([place])   ? per patient  ? Breast cancer ([place])   ? Cancer ([place]) 08/2019  ? right breast ILC  ? Cirrhosis ([place])   ? Cirrhosis of liver ([place])   ? Complication of anesthesia   ? per patient, slow to awake  ? Diabetes mellitus   ? GERD (gastroesophageal reflux disease)   ? Also, Hiatal Hernia   ? H/O migraine   ? Hepatitis   ? per patient, had infectious hepatitis as a 81 year old  ? Hyperlipidemia   ? Hypertension   ? Hypothyroidism   ? Splenic artery aneurysm ([place])   ? ? ?SURGICAL HISTORY: ?Past Surgical History:  ?Procedure Laterality Date  ? APPENDECTOMY    ? BREAST LUMPECTOMY    ? Right breast lumpectomy  ?  CATARACT EXTRACTION W/ INTRAOCULAR LENS  IMPLANT, BILATERAL  2018  ? CHOLECYSTECTOMY  04/2010  ? MASTECTOMY W/ SENTINEL NODE BIOPSY Right 09/07/2019  ? Procedure: RIGHT SIMPLE MASTECTOMY WITH SENTINEL LYMPH NODE MAPPING;  Surgeon: Erroll Luna, MD;  Location: [facility];  Service: General;  Laterality: Right;  PEC BLOCK  ? SCAR REVISION Right 06/21/2020  ? Procedure: SCAR REVISION OF RIGHT MASTECTOMY;  Surgeon: Erroll Luna, MD;  Location: [facility];  Service: General;  Laterality: Right;  ? TONSILLECTOMY    ? TUBAL LIGATION  1975  ? ? ?ALLERGIES:  is  allergic to demerol, meperidine, and meperidine hcl. ? ?MEDICATIONS:  ?Current Outpatient Medications  ?Medication Sig Dispense Refill  ? sulfamethoxazole-trimethoprim (BACTRIM DS) 800-160 MG tablet Take 1 tablet by mouth 2 (two) times daily. For 3 days 6 tablet 0  ? calcium carbonate (OS-CAL) 600 MG TABS tablet Take 600 mg by mouth daily with breakfast.    ? celecoxib (CELEBREX) 200 MG capsule Take 200 mg by mouth 2 (two) times daily.    ? cholecalciferol (VITAMIN D3) 25 MCG (1000 UNIT) tablet Take 1,000 Units by mouth daily.    ? CINNAMON PO Take 500 mg by mouth daily.     ? Coenzyme Q10 (COQ10) 100 MG CAPS     ? fenofibrate 160 MG tablet Take 160 mg by mouth daily.    ? folic acid (FOLVITE) 1 MG tablet Take 1 tablet (1 mg total) by mouth daily. 90 tablet 3  ? Ginkgo Biloba 60 MG CAPS Take 60 mg by mouth daily.    ? Glucose Blood (ONETOUCH ULTRA BLUE VI) daily.    ? ibuprofen (ADVIL) 800 MG tablet Take 1 tablet (800 mg total) by mouth every 8 (eight) hours as needed. 30 tablet 0  ? letrozole (FEMARA) 2.5 MG tablet Take 1 tablet by mouth once daily 90 tablet 0  ? levothyroxine (SYNTHROID) 112 MCG tablet Take 112 mcg by mouth daily before breakfast.    ? losartan (COZAAR) 25 MG tablet Take 12.5 mg by mouth daily.     ? metFORMIN (GLUCOPHAGE) 500 MG tablet Take 500 mg by mouth daily with breakfast.    ? Omega-3 Fatty Acids (FISH OIL) 600 MG CAPS Take 600 mg by mouth daily.    ? omeprazole (PRILOSEC) 20 MG capsule Take 20 mg by mouth daily as needed (acid reflux).     ? pioglitazone (ACTOS) 15 MG tablet Take 15 mg by mouth daily.    ? simvastatin (ZOCOR) 20 MG tablet Take 20 mg by mouth every evening.    ? vitamin B-12 (CYANOCOBALAMIN) 1000 MCG tablet Take 1,000 mcg by mouth daily.    ? ?No current facility-administered medications for this visit.  ? ? ?REVIEW OF SYSTEMS:   ?Constitutional: ( - ) fevers, ( - )  chills , ( - ) night sweats ?Eyes: ( - ) blurriness of vision, ( - ) double vision, ( - ) watery eyes ?Ears,  nose, mouth, throat, and face: ( - ) mucositis, ( - ) sore throat ?Respiratory: ( - ) cough, ( - ) dyspnea, ( - ) wheezes ?Cardiovascular: ( - ) palpitation, ( - ) chest discomfort, ( - ) lower extremity swelling ?Gastrointestinal:  ( - ) nausea, ( - ) heartburn, ( - ) change in bowel habits ?Skin: ( - ) abnormal skin rashes ?Lymphatics: ( - ) new lymphadenopathy, ( - ) easy bruising ?Neurological: ( - ) numbness, ( - ) tingling, ( - )  new weaknesses ?Behavioral/Psych: ( - ) mood change, ( - ) new changes  ?All other systems were reviewed with the patient and are negative. ? ?PHYSICAL EXAMINATION: ?ECOG PERFORMANCE STATUS: 1 - Symptomatic but completely ambulatory ? ?There were no vitals filed for this visit. ? ? ?There were no vitals filed for this visit. ? ?GENERAL: well appearing elderly Caucasian female in NAD  ?SKIN: skin color, texture, turgor are normal, no rashes or significant lesions ?EYES: conjunctiva are pink and non-injected, sclera clear ?BREAST: s/p simple mastectomy. No evidence of infection, well healing incision. Drains in place.  ?LUNGS: clear to auscultation and percussion with normal breathing effort ?HEART: regular rate & rhythm and no murmurs and no lower extremity edema ?Musculoskeletal: no cyanosis of digits and no clubbing  ?PSYCH: alert & oriented x 3, fluent speech ?NEURO: no focal motor/sensory deficits ? ?LABORATORY DATA:  ?I have reviewed the data as listed ? ?  Latest Ref Rng & Units 07/24/2021  ? 10:41 AM 01/24/2021  ?  9:56 AM 07/19/2020  ?  7:59 AM  ?CBC  ?WBC 4.0 - 10.5 K/uL 3.0   2.5   2.6    ?Hemoglobin 12.0 - 15.0 g/dL 12.1   11.3   11.5    ?Hematocrit 36.0 - 46.0 % 36.1   35.4   35.3    ?Platelets 150 - 400 K/uL 88   78   103    ? ? ? ?  Latest Ref Rng & Units 07/24/2021  ? 10:41 AM 01/24/2021  ?  9:56 AM 11/05/2020  ?  9:27 AM  ?CMP  ?Glucose 70 - 99 mg/dL 124   114     ?BUN 8 - 23 mg/dL 40   26     ?Creatinine 0.44 - 1.00 mg/dL 1.65   1.54     ?Sodium 135 - 145 mmol/L 140   142      ?Potassium 3.5 - 5.1 mmol/L 4.7   4.7     ?Chloride 98 - 111 mmol/L 110   112     ?CO2 22 - 32 mmol/L 24   21     ?Calcium 8.9 - 10.3 mg/dL 9.4   8.9   9.2    ?Total Protein 6.5 - 8.1 g/dL 7.1   6.8

## 2021-07-25 ENCOUNTER — Telehealth: Payer: Self-pay | Admitting: Hematology and Oncology

## 2021-07-25 NOTE — Telephone Encounter (Signed)
Per 5/17 los called pt to confirmed appointments.  Voicemail was full and unable to leave message .  Calender is being mailed

## 2021-07-27 LAB — URINE CULTURE: Culture: >=100000 — AB

## 2021-07-29 ENCOUNTER — Telehealth: Payer: Self-pay

## 2021-07-29 NOTE — Telephone Encounter (Addendum)
Spoke with patient to advise of message below. Patient urinary symptoms have resolved.   ----- Message from Orson Slick, MD sent at 07/28/2021  3:53 PM EDT ----- Please let Mrs. Windom know that her urine grew E. Coli. The bactrim we prescribed should be effective against it. Please assure her symptoms are improving.  ----- Message ----- From: Buel Ream, [facility] [place] Sent: 07/24/2021  11:31 AM EDT To: Orson Slick, MD

## 2021-08-22 DIAGNOSIS — E782 Mixed hyperlipidemia: Secondary | ICD-10-CM | POA: Diagnosis not present

## 2021-08-22 DIAGNOSIS — R011 Cardiac murmur, unspecified: Secondary | ICD-10-CM | POA: Diagnosis not present

## 2021-08-22 DIAGNOSIS — E559 Vitamin D deficiency, unspecified: Secondary | ICD-10-CM | POA: Diagnosis not present

## 2021-08-22 DIAGNOSIS — E039 Hypothyroidism, unspecified: Secondary | ICD-10-CM | POA: Diagnosis not present

## 2021-08-22 DIAGNOSIS — I129 Hypertensive chronic kidney disease with stage 1 through stage 4 chronic kidney disease, or unspecified chronic kidney disease: Secondary | ICD-10-CM | POA: Diagnosis not present

## 2021-08-22 DIAGNOSIS — M8588 Other specified disorders of bone density and structure, other site: Secondary | ICD-10-CM | POA: Diagnosis not present

## 2021-08-22 DIAGNOSIS — D696 Thrombocytopenia, unspecified: Secondary | ICD-10-CM | POA: Diagnosis not present

## 2021-08-22 DIAGNOSIS — E1129 Type 2 diabetes mellitus with other diabetic kidney complication: Secondary | ICD-10-CM | POA: Diagnosis not present

## 2021-08-22 DIAGNOSIS — R251 Tremor, unspecified: Secondary | ICD-10-CM | POA: Diagnosis not present

## 2021-08-22 DIAGNOSIS — N1831 Chronic kidney disease, stage 3a: Secondary | ICD-10-CM | POA: Diagnosis not present

## 2021-08-22 DIAGNOSIS — C50911 Malignant neoplasm of unspecified site of right female breast: Secondary | ICD-10-CM | POA: Diagnosis not present

## 2021-08-22 DIAGNOSIS — K219 Gastro-esophageal reflux disease without esophagitis: Secondary | ICD-10-CM | POA: Diagnosis not present

## 2021-08-27 ENCOUNTER — Other Ambulatory Visit: Payer: Self-pay | Admitting: Family Medicine

## 2021-08-27 DIAGNOSIS — M8588 Other specified disorders of bone density and structure, other site: Secondary | ICD-10-CM

## 2021-09-19 DIAGNOSIS — H04123 Dry eye syndrome of bilateral lacrimal glands: Secondary | ICD-10-CM | POA: Diagnosis not present

## 2021-09-19 DIAGNOSIS — Z7984 Long term (current) use of oral hypoglycemic drugs: Secondary | ICD-10-CM | POA: Diagnosis not present

## 2021-09-19 DIAGNOSIS — H43391 Other vitreous opacities, right eye: Secondary | ICD-10-CM | POA: Diagnosis not present

## 2021-09-19 DIAGNOSIS — H52203 Unspecified astigmatism, bilateral: Secondary | ICD-10-CM | POA: Diagnosis not present

## 2021-09-19 DIAGNOSIS — E119 Type 2 diabetes mellitus without complications: Secondary | ICD-10-CM | POA: Diagnosis not present

## 2021-09-19 DIAGNOSIS — H524 Presbyopia: Secondary | ICD-10-CM | POA: Diagnosis not present

## 2021-09-19 DIAGNOSIS — H5202 Hypermetropia, left eye: Secondary | ICD-10-CM | POA: Diagnosis not present

## 2021-09-19 DIAGNOSIS — Z961 Presence of intraocular lens: Secondary | ICD-10-CM | POA: Diagnosis not present

## 2021-10-02 NOTE — Progress Notes (Signed)
Assessment/Plan:    1.  Essential Tremor  -mild  -no evidence of Parkinsons Disease today - reassurance provide  -discussed with her that I don't recommend medication right now due to mild tremor and worry about thrombocytopenia.    2.  Chronic thrombocytopenia, leukopenia, lymphopenia (felt due to patient's liver cirrhosis) with history of recurrent breast cancer             -Following with hematology.  On letrozole  3.  F/u prn  Subjective:   Catherine Decker was seen today in follow up for essential tremor.  My previous records were reviewed prior to todays visit.  Patient last seen just over a year ago, at which point I really did not recommend any medication for her.  She had a history of chronic cirrhosis and her tremor was not that bothersome for her.  In addition, she had chronic thrombocytopenia, leukopenia, lymphopenia and we just decided that further medication was just not and her best interest.  She returns today stating that tremor has been worsening.  She notes it in both hands, L>R.  She cannot state if its at rest or activation.  She can read her handwriting.  She can drink without spilling it most of the time.  She doesn't drink much caffeine.  She doesn't drink any alcohol.   Records from oncology from May, 2023 reported that patient complained about resting tremor and shaking.  Noted that oncology stated that they did not visualize it on examination.  Patient worried because her brother and father have Parkinson's disease, and son had essential tremor.   ALLERGIES:   Allergies  Allergen Reactions   Demerol Anaphylaxis   Meperidine Anaphylaxis   Meperidine Hcl Other (See Comments)    CURRENT MEDICATIONS:  Current Meds  Medication Sig   calcium carbonate (OS-CAL) 600 MG TABS tablet Take 600 mg by mouth daily with breakfast.   celecoxib (CELEBREX) 200 MG capsule Take 200 mg by mouth 2 (two) times daily.   cholecalciferol (VITAMIN D3) 25 MCG (1000 UNIT) tablet Take  1,000 Units by mouth daily.   CINNAMON PO Take 500 mg by mouth daily.    Coenzyme Q10 (COQ10) 100 MG CAPS    fenofibrate 160 MG tablet Take 160 mg by mouth daily.   folic acid (FOLVITE) 1 MG tablet Take 1 tablet (1 mg total) by mouth daily.   Ginkgo Biloba 60 MG CAPS Take 60 mg by mouth daily.   Glucose Blood (ONETOUCH ULTRA BLUE VI) daily.   letrozole (FEMARA) 2.5 MG tablet Take 1 tablet by mouth once daily   levothyroxine (SYNTHROID) 112 MCG tablet Take 112 mcg by mouth daily before breakfast.   losartan (COZAAR) 25 MG tablet Take 12.5 mg by mouth daily.    Omega-3 Fatty Acids (FISH OIL) 600 MG CAPS Take 600 mg by mouth daily.   omeprazole (PRILOSEC) 20 MG capsule Take 20 mg by mouth daily as needed (acid reflux).    pioglitazone (ACTOS) 15 MG tablet Take 15 mg by mouth daily.   simvastatin (ZOCOR) 20 MG tablet Take 20 mg by mouth every evening.   vitamin B-12 (CYANOCOBALAMIN) 1000 MCG tablet Take 1,000 mcg by mouth daily.      Objective:    PHYSICAL EXAMINATION:    VITALS:   Vitals:   10/04/21 0805  BP: (!) 139/57  Pulse: 67  SpO2: 99%  Weight: 165 lb (74.8 kg)  Height: 5' 3.5" (1.613 m)    GEN:  The patient  appears stated age and is in NAD. HEENT:  Normocephalic, atraumatic.  The mucous membranes are moist. The superficial temporal arteries are without ropiness or tenderness. CV:  RRR Lungs:  CTAB Neck/HEME:  There are no carotid bruits bilaterally.  Neurological examination:  Orientation: The patient is alert and oriented x3. Cranial nerves: There is good facial symmetry. The speech is fluent and clear. Soft palate rises symmetrically and there is no tongue deviation. Hearing is intact to conversational tone. Sensation: Sensation is intact to light touch throughout Motor: Strength is at least antigravity x4.  Movement examination: Tone: There is normal tone in the bilateral upper extremities.  The tone in the lower extremities is normal.  Abnormal movements:  There is no rest tremor.  There is postural tremor that is fairly mild, left greater than right.  There is intention tremor, left greater than right.  She has mild trouble with Archimedes spirals, left greater than right.  Tremor is most evident when she tries to pour water from one glass to another. Coordination:  There is no decremation with RAM's, with any form of RAMS, including alternating supination and pronation of the forearm, hand opening and closing, finger taps, heel taps and toe taps. Gait and Station: The patient pushes off to arise.  Gait is wide based and she is antalgic (same as previous) I have reviewed and interpreted the following labs independently   Chemistry      Component Value Date/Time   NA 140 07/24/2021 1041   K 4.7 07/24/2021 1041   CL 110 07/24/2021 1041   CO2 24 07/24/2021 1041   BUN 40 (H) 07/24/2021 1041   CREATININE 1.65 (H) 07/24/2021 1041   CREATININE 0.85 06/01/2013 1648      Component Value Date/Time   CALCIUM 9.4 07/24/2021 1041   ALKPHOS 54 07/24/2021 1041   AST 44 (H) 07/24/2021 1041   ALT 17 07/24/2021 1041   BILITOT 1.0 07/24/2021 1041      Lab Results  Component Value Date   WBC 3.0 (L) 07/24/2021   HGB 12.1 07/24/2021   HCT 36.1 07/24/2021   MCV 97.6 07/24/2021   PLT 88 (L) 07/24/2021   Lab Results  Component Value Date   TSH 0.296 (L) 03/31/2019     Chemistry      Component Value Date/Time   NA 140 07/24/2021 1041   K 4.7 07/24/2021 1041   CL 110 07/24/2021 1041   CO2 24 07/24/2021 1041   BUN 40 (H) 07/24/2021 1041   CREATININE 1.65 (H) 07/24/2021 1041   CREATININE 0.85 06/01/2013 1648      Component Value Date/Time   CALCIUM 9.4 07/24/2021 1041   ALKPHOS 54 07/24/2021 1041   AST 44 (H) 07/24/2021 1041   ALT 17 07/24/2021 1041   BILITOT 1.0 07/24/2021 1041         Total time spent on today's visit was 25 minutes, including both face-to-face time and nonface-to-face time.  Time included that spent on review of  records (prior notes available to me/labs/imaging if pertinent), discussing treatment and goals, answering patient's questions and coordinating care.  Cc:  Antony Contras, MD

## 2021-10-04 ENCOUNTER — Encounter: Payer: Self-pay | Admitting: Neurology

## 2021-10-04 ENCOUNTER — Ambulatory Visit: Payer: Medicare HMO | Admitting: Neurology

## 2021-10-04 VITALS — BP 139/57 | HR 67 | Ht 63.5 in | Wt 165.0 lb

## 2021-10-04 DIAGNOSIS — G250 Essential tremor: Secondary | ICD-10-CM

## 2021-10-04 NOTE — Patient Instructions (Addendum)
The physicians and staff at [facility] are committed to providing excellent care. You may receive a survey requesting feedback about your experience at our office. We strive to receive "very good" responses to the survey questions. If you feel that your experience would prevent you from giving the office a "very good " response, please contact our office to try to remedy the situation. We may be reached at [phone]. Thank you for taking the time out of your busy day to complete the survey.   Essential Tremor A tremor is trembling or shaking that a person cannot control. Most tremors affect the hands or arms. Tremors can also affect the head, vocal cords, legs, and other parts of the body. Essential tremor is a tremor without a known cause. Usually, it occurs while a person is trying to perform an action. It tends to get worse gradually as a person ages. What are the causes? The cause of this condition is not known, but it often runs in families. What increases the risk? You are more likely to develop this condition if: You have a family member with essential tremor. You are 41 years of age or older. What are the signs or symptoms? The main sign of a tremor is a rhythmic shaking of certain parts of your body that is uncontrolled and unintentional. You may: Have difficulty eating with a spoon or fork. Have difficulty writing. Nod your head up and down or side to side. Have a quivering voice. The shaking may: Get worse over time. Come and go. Be more noticeable on one side of your body. Get worse due to stress, tiredness (fatigue), caffeine, and extreme heat or cold. How is this diagnosed? This condition may be diagnosed based on: Your symptoms and medical history. A physical exam. There is no single test to diagnose an essential tremor. However, your health care provider may order tests to rule out other causes of your condition. These may include: Blood and urine tests. Imaging  studies of your brain, such as a CT scan or MRI. How is this treated? Treatment for essential tremor depends on the severity of the condition. Mild tremors may not need treatment if they do not affect your day-to-day life. Severe tremors may need to be treated using one or more of the following options: Medicines. Injections of a substance called botulinum toxin. Procedures such as deep brain stimulation (DBS) implantation or MRI-guided ultrasound treatment. Lifestyle changes. Occupational or physical therapy. Follow these instructions at home: Lifestyle  Do not use any products that contain nicotine or tobacco. These products include cigarettes, chewing tobacco, and vaping devices, such as e-cigarettes. If you need help quitting, ask your health care provider. Limit your caffeine intake as told by your health care provider. Try to get 8 hours of sleep each night. Find ways to manage your stress that fit your lifestyle and personality. Consider trying meditation or yoga. Try to anticipate stressful situations and allow extra time to manage them. If you are struggling emotionally with the effects of your tremor, consider working with a mental health provider. General instructions Take over-the-counter and prescription medicines only as told by your health care provider. Avoid extreme heat and extreme cold. Keep all follow-up visits. This is important. Visits may include physical therapy visits. Where to find more information [employer] of Neurological Disorders and Stroke: [employer] Contact a health care provider if: You experience any changes in the location or intensity of your tremors. You start having a tremor after starting  a new medicine. You have a tremor with other symptoms, such as: Numbness. Tingling. Pain. Weakness. Your tremor gets worse. Your tremor interferes with your daily life. You feel down, blue, or sad for at least 2 weeks in a row. Worrying  about your tremor and what other people think about you interferes with your everyday life functions, including relationships, work, or school. Summary Essential tremor is a tremor without a known cause. Usually, it occurs when you are trying to perform an action. You are more likely to develop this condition if you have a family member with essential tremor. The main sign of a tremor is a rhythmic shaking of certain parts of your body that is uncontrolled and unintentional. Treatment for essential tremor depends on the severity of the condition. This information is not intended to replace advice given to you by your health care provider. Make sure you discuss any questions you have with your health care provider. Document Revised: 12/14/2020 Document Reviewed: 12/14/2020 Elsevier Patient Education  [place].

## 2021-10-05 ENCOUNTER — Other Ambulatory Visit: Payer: Self-pay | Admitting: Hematology and Oncology

## 2021-10-11 DIAGNOSIS — E039 Hypothyroidism, unspecified: Secondary | ICD-10-CM | POA: Diagnosis not present

## 2021-10-14 DIAGNOSIS — M7541 Impingement syndrome of right shoulder: Secondary | ICD-10-CM | POA: Diagnosis not present

## 2021-10-24 DIAGNOSIS — M25511 Pain in right shoulder: Secondary | ICD-10-CM | POA: Diagnosis not present

## 2021-10-24 DIAGNOSIS — M79604 Pain in right leg: Secondary | ICD-10-CM | POA: Diagnosis not present

## 2021-11-06 ENCOUNTER — Ambulatory Visit: Payer: Medicare HMO | Admitting: Endocrinology

## 2021-12-05 DIAGNOSIS — E039 Hypothyroidism, unspecified: Secondary | ICD-10-CM | POA: Diagnosis not present

## 2022-01-21 DIAGNOSIS — M7912 Myalgia of auxiliary muscles, head and neck: Secondary | ICD-10-CM | POA: Diagnosis not present

## 2022-01-21 DIAGNOSIS — M5417 Radiculopathy, lumbosacral region: Secondary | ICD-10-CM | POA: Diagnosis not present

## 2022-01-21 DIAGNOSIS — M546 Pain in thoracic spine: Secondary | ICD-10-CM | POA: Diagnosis not present

## 2022-01-21 DIAGNOSIS — M62830 Muscle spasm of back: Secondary | ICD-10-CM | POA: Diagnosis not present

## 2022-01-21 DIAGNOSIS — R519 Headache, unspecified: Secondary | ICD-10-CM | POA: Diagnosis not present

## 2022-01-21 DIAGNOSIS — M5413 Radiculopathy, cervicothoracic region: Secondary | ICD-10-CM | POA: Diagnosis not present

## 2022-01-21 DIAGNOSIS — M5412 Radiculopathy, cervical region: Secondary | ICD-10-CM | POA: Diagnosis not present

## 2022-01-24 ENCOUNTER — Other Ambulatory Visit: Payer: Self-pay | Admitting: *Deleted

## 2022-01-24 ENCOUNTER — Inpatient Hospital Stay (HOSPITAL_BASED_OUTPATIENT_CLINIC_OR_DEPARTMENT_OTHER): Payer: Medicare HMO | Admitting: Hematology and Oncology

## 2022-01-24 ENCOUNTER — Inpatient Hospital Stay: Payer: Medicare HMO

## 2022-01-24 ENCOUNTER — Other Ambulatory Visit: Payer: Self-pay | Admitting: Hematology and Oncology

## 2022-01-24 ENCOUNTER — Inpatient Hospital Stay: Payer: Medicare HMO | Attending: Hematology and Oncology

## 2022-01-24 VITALS — BP 156/60 | HR 74 | Temp 97.9°F | Resp 15 | Wt 163.0 lb

## 2022-01-24 DIAGNOSIS — Z853 Personal history of malignant neoplasm of breast: Secondary | ICD-10-CM | POA: Diagnosis not present

## 2022-01-24 DIAGNOSIS — Z923 Personal history of irradiation: Secondary | ICD-10-CM | POA: Diagnosis not present

## 2022-01-24 DIAGNOSIS — D696 Thrombocytopenia, unspecified: Secondary | ICD-10-CM

## 2022-01-24 DIAGNOSIS — Z79899 Other long term (current) drug therapy: Secondary | ICD-10-CM | POA: Diagnosis not present

## 2022-01-24 DIAGNOSIS — K7460 Unspecified cirrhosis of liver: Secondary | ICD-10-CM | POA: Insufficient documentation

## 2022-01-24 LAB — RETIC PANEL
Immature Retic Fract: 6.2 % (ref 2.3–15.9)
RBC.: 2.88 MIL/uL — ABNORMAL LOW (ref 3.87–5.11)
Retic Count, Absolute: 38.6 10*3/uL (ref 19.0–186.0)
Retic Ct Pct: 1.3 % (ref 0.4–3.1)
Reticulocyte Hemoglobin: 36.6 pg (ref >27.9)

## 2022-01-24 LAB — CMP (CANCER CENTER ONLY)
ALT: 21 U/L (ref 0–44)
AST: 66 U/L — ABNORMAL HIGH (ref 15–41)
Albumin: 3.5 g/dL (ref 3.5–5.0)
Alkaline Phosphatase: 107 U/L (ref 38–126)
Anion gap: 5 (ref 5–15)
BUN: 37 mg/dL — ABNORMAL HIGH (ref 8–23)
CO2: 21 mmol/L — ABNORMAL LOW (ref 22–32)
Calcium: 9.3 mg/dL (ref 8.9–10.3)
Chloride: 115 mmol/L — ABNORMAL HIGH (ref 98–111)
Creatinine: 1.66 mg/dL — ABNORMAL HIGH (ref 0.44–1.00)
GFR, Estimated: 31 mL/min — ABNORMAL LOW (ref >60)
Glucose, Bld: 106 mg/dL — ABNORMAL HIGH (ref 70–99)
Potassium: 5.1 mmol/L (ref 3.5–5.1)
Sodium: 141 mmol/L (ref 135–145)
Total Bilirubin: 1 mg/dL (ref 0.3–1.2)
Total Protein: 6.8 g/dL (ref 6.5–8.1)

## 2022-01-24 LAB — CBC WITH DIFFERENTIAL (CANCER CENTER ONLY)
Abs Immature Granulocytes: 0.02 10*3/uL (ref 0.00–0.07)
Basophils Absolute: 0 10*3/uL (ref 0.0–0.1)
Basophils Relative: 1 %
Eosinophils Absolute: 0.2 10*3/uL (ref 0.0–0.5)
Eosinophils Relative: 5 %
HCT: 30.9 % — ABNORMAL LOW (ref 36.0–46.0)
Hemoglobin: 10.1 g/dL — ABNORMAL LOW (ref 12.0–15.0)
Immature Granulocytes: 1 %
Lymphocytes Relative: 22 %
Lymphs Abs: 0.6 10*3/uL — ABNORMAL LOW (ref 0.7–4.0)
MCH: 33.6 pg (ref 26.0–34.0)
MCHC: 32.7 g/dL (ref 30.0–36.0)
MCV: 102.7 fL — ABNORMAL HIGH (ref 80.0–100.0)
Monocytes Absolute: 0.2 10*3/uL (ref 0.1–1.0)
Monocytes Relative: 8 %
Neutro Abs: 1.8 10*3/uL (ref 1.7–7.7)
Neutrophils Relative %: 63 %
Platelet Count: 73 10*3/uL — ABNORMAL LOW (ref 150–400)
RBC: 3.01 MIL/uL — ABNORMAL LOW (ref 3.87–5.11)
RDW: 14.6 % (ref 11.5–15.5)
WBC Count: 2.9 10*3/uL — ABNORMAL LOW (ref 4.0–10.5)
nRBC: 0 % (ref 0.0–0.2)

## 2022-01-24 LAB — IRON AND IRON BINDING CAPACITY (CC-WL,HP ONLY)
Iron: 83 ug/dL (ref 28–170)
Saturation Ratios: 22 % (ref 10.4–31.8)
TIBC: 379 ug/dL (ref 250–450)
UIBC: 296 ug/dL (ref 148–442)

## 2022-01-24 LAB — VITAMIN B12: Vitamin B-12: 486 pg/mL (ref 180–914)

## 2022-01-24 LAB — FERRITIN: Ferritin: 87 ng/mL (ref 11–307)

## 2022-01-24 LAB — FOLATE: Folate: 10.2 ng/mL (ref >5.9)

## 2022-01-24 MED ORDER — LETROZOLE 2.5 MG PO TABS: 2.5000 mg | ORAL_TABLET | Freq: Every day | ORAL | 1 refills | Status: DC

## 2022-01-24 NOTE — Progress Notes (Signed)
[place] [phone]   [phone]  PROGRESS NOTE  Patient Care Team: Antony Contras, MD as PCP - General (Family Medicine) Tat, Eustace Quail, DO as Consulting Physician (Neurology)  Hematological/Oncological History #Leukopenia, Lymphopenia #Thrombocytopenia 1) 05/15/2010: WBC 4.5, Hgb 14.1, Plt 124, MCV 88.5. ANC 2.9, Lymph 1.1. Earliest CBC on record.  2) 01/25/2019: WBC 3.0, Hgb 12.8, Plt 101, MCV 93.3. ANC 1.8, Lymph 0.8, Mono 0.2 3) 03/15/2019: WBC 2.5, Hgb 12.9, Plt 96. MCV 93.6. ANC 1.5, Lymph 0.7, Mono 0.2 4) 03/31/2019: establish care with Dr. Lorenso Courier    #Recurrent Breast Cancer  1) underwent lumpectomy and radiation therapy in 2000 on right breast cancer  2) declined chemotherapy and only received 6 months of tamoxifen therapy 3) 06/16/2019: Mammogram showed a new 1.4 cm mass has been noted in the right breast. Biopsy performed showed 90% ER, 20% PR, and HER2 negative. 4) 07/01/2019: clinic visit scheduled with Breast Surgery  5) 09/07/2019: right simple mastectomy performed with sentinel lymph node mapping. Findings showed an invasive lobular carcinoma 1.4 cm with no evidence of lymph node involvement.   Interval History:  Catherine Decker 81 y.o. female with medical history significant for leukopenia/thrombocytopenia who presents for a follow up visit. The patient's last visit was on 01/24/2021.  In the interim since her last visit she has been compliant with letrozole therapy.  On exam today Catherine Decker reports her sister passed away suddenly and she has been having to make trips to [place].  She inherited 7.5 acres of land and is having to deal with the fallout of her sisters passing away.  The patient reports that she is having difficulty with cramps in her feet and occasional cramps in her left hand.  She does get occasional pains in her chest as well.  She notes that it is gone now but sometimes deep breaths cause the pain.  She reports her  energy is okay and she typically takes about 1/2 to 1-hour nap daily.  She reports that her appetite is good and she typically tries to eat 1 large meal per day.  She reports that she is does not have any issues with bleeding other than some occasional hemorrhoidal bleeding.  She continues to take her vitamin B12 daily.  She notes that she does have loose stools on occasion.  She reports that there are certain foods that cause this such as pork or salads.  Her letrozole is not causing any difficulty.  Overall she is at her baseline level of health and is willing and able to proceed with letrozole therapy at this time.  She currently denies having any issues with fevers, chills, sweats, nausea, vomiting or diarrhea.  A full 10 point ROS is the blood.  MEDICAL HISTORY:  Past Medical History:  Diagnosis Date   Anemia    Arthritis of back    Arthritis of hand    per patient right hand   Autoimmune deficiency syndrome ([place])    per patient   Breast cancer ([place])    Cancer ([place]) 08/2019   right breast ILC   Cirrhosis ([place])    Cirrhosis of liver ([facility])    Complication of anesthesia    per patient, slow to awake   Diabetes mellitus    GERD (gastroesophageal reflux disease)    Also, Hiatal Hernia    H/O migraine    Hepatitis    per patient, had infectious hepatitis as a 81 year old   Hyperlipidemia  Hypertension    Hypothyroidism    Splenic artery aneurysm ([place])     SURGICAL HISTORY: Past Surgical History:  Procedure Laterality Date   APPENDECTOMY     BREAST LUMPECTOMY     Right breast lumpectomy   CATARACT EXTRACTION W/ INTRAOCULAR LENS  IMPLANT, BILATERAL  2018   CHOLECYSTECTOMY  04/2010   MASTECTOMY W/ SENTINEL NODE BIOPSY Right 09/07/2019   Procedure: RIGHT SIMPLE MASTECTOMY WITH SENTINEL LYMPH NODE MAPPING;  Surgeon: Erroll Luna, MD;  Location: [facility];  Service: General;  Laterality: Right;  PEC BLOCK   SCAR REVISION Right 06/21/2020   Procedure: SCAR REVISION OF RIGHT MASTECTOMY;   Surgeon: Erroll Luna, MD;  Location: [facility];  Service: General;  Laterality: Right;   TONSILLECTOMY     TUBAL LIGATION  1975    ALLERGIES:  is allergic to demerol, meperidine, and meperidine hcl.  MEDICATIONS:  Current Outpatient Medications  Medication Sig Dispense Refill   calcium carbonate (OS-CAL) 600 MG TABS tablet Take 600 mg by mouth daily with breakfast.     celecoxib (CELEBREX) 200 MG capsule Take 200 mg by mouth 2 (two) times daily.     cholecalciferol (VITAMIN D3) 25 MCG (1000 UNIT) tablet Take 1,000 Units by mouth daily.     Coenzyme Q10 (COQ10) 100 MG CAPS      fenofibrate 160 MG tablet Take 160 mg by mouth daily.     folic acid (FOLVITE) 1 MG tablet Take 1 tablet (1 mg total) by mouth daily. 90 tablet 3   Ginkgo Biloba 60 MG CAPS Take 60 mg by mouth daily.     Glucose Blood (ONETOUCH ULTRA BLUE VI) daily.     letrozole (FEMARA) 2.5 MG tablet Take 1 tablet (2.5 mg total) by mouth daily. 90 tablet 1   levothyroxine (SYNTHROID) 112 MCG tablet Take 112 mcg by mouth daily before breakfast.     losartan (COZAAR) 25 MG tablet Take 12.5 mg by mouth daily.      Omega-3 Fatty Acids (FISH OIL) 600 MG CAPS Take 600 mg by mouth daily.     omeprazole (PRILOSEC) 20 MG capsule Take 20 mg by mouth daily as needed (acid reflux).      pioglitazone (ACTOS) 15 MG tablet Take 15 mg by mouth daily.     simvastatin (ZOCOR) 20 MG tablet Take 20 mg by mouth every evening.     vitamin B-12 (CYANOCOBALAMIN) 1000 MCG tablet Take 1,000 mcg by mouth daily.     No current facility-administered medications for this visit.    REVIEW OF SYSTEMS:   Constitutional: ( - ) fevers, ( - )  chills , ( - ) night sweats Eyes: ( - ) blurriness of vision, ( - ) double vision, ( - ) watery eyes Ears, nose, mouth, throat, and face: ( - ) mucositis, ( - ) sore throat Respiratory: ( - ) cough, ( - ) dyspnea, ( - ) wheezes Cardiovascular: ( - ) palpitation, ( - ) chest discomfort, ( - ) lower  extremity swelling Gastrointestinal:  ( - ) nausea, ( - ) heartburn, ( - ) change in bowel habits Skin: ( - ) abnormal skin rashes Lymphatics: ( - ) new lymphadenopathy, ( - ) easy bruising Neurological: ( - ) numbness, ( - ) tingling, ( - ) new weaknesses Behavioral/Psych: ( - ) mood change, ( - ) new changes  All other systems were reviewed with the patient and are negative.  PHYSICAL EXAMINATION: ECOG PERFORMANCE STATUS: 1 -  Symptomatic but completely ambulatory  Vitals:   01/24/22 1138  BP: (!) 156/60  Pulse: 74  Resp: 15  Temp: 97.9 F (36.6 C)  SpO2: 100%     Filed Weights   01/24/22 1138  Weight: 163 lb (73.9 kg)    GENERAL: well appearing elderly Caucasian female in NAD  SKIN: skin color, texture, turgor are normal, no rashes or significant lesions EYES: conjunctiva are pink and non-injected, sclera clear BREAST: s/p simple mastectomy. No evidence of infection, well healing incision. Drains in place.  LUNGS: clear to auscultation and percussion with normal breathing effort HEART: regular rate & rhythm and no murmurs and no lower extremity edema Musculoskeletal: no cyanosis of digits and no clubbing  PSYCH: alert & oriented x 3, fluent speech NEURO: no focal motor/sensory deficits  LABORATORY DATA:  I have reviewed the data as listed    Latest Ref Rng & Units 01/24/2022   11:16 AM 07/24/2021   10:41 AM 01/24/2021    9:56 AM  CBC  WBC 4.0 - 10.5 K/uL 2.9  3.0  2.5   Hemoglobin 12.0 - 15.0 g/dL 10.1  12.1  11.3   Hematocrit 36.0 - 46.0 % 30.9  36.1  35.4   Platelets 150 - 400 K/uL 73  88  78        Latest Ref Rng & Units 01/24/2022   11:16 AM 07/24/2021   10:41 AM 01/24/2021    9:56 AM  CMP  Glucose 70 - 99 mg/dL 106  124  114   BUN 8 - 23 mg/dL 37  40  26   Creatinine 0.44 - 1.00 mg/dL 1.66  1.65  1.54   Sodium 135 - 145 mmol/L 141  140  142   Potassium 3.5 - 5.1 mmol/L 5.1  4.7  4.7   Chloride 98 - 111 mmol/L 115  110  112   CO2 22 - 32 mmol/L _0 Calcium 8.9 - 10.3 mg/dL 9.3  9.4  8.9   Total Protein 6.5 - 8.1 g/dL 6.8  7.1  6.8   Total Bilirubin 0.3 - 1.2 mg/dL 1.0  1.0  0.9   Alkaline Phos 38 - 126 U/L 107  54  47   AST 15 - 41 U/L 66  44  35   ALT 0 - 44 U/L _1 RADIOGRAPHIC STUDIES:  No results found.  ASSESSMENT & PLAN IDORA BROSIOUS 81 y.o. female with medical history significant for leukopenia/thrombocytopenia who presents for a follow up visit.  After review the labs discussion with the patient her findings are most consistent with leukopenia and thrombocytopenia due to chronic liver disease.  The patient has not responded to vitamin B12 or folate therapy, but we did wish to assure that her levels of these are replete.  The patient is fortunately already established with the [facility] and therefore these cytopenias could further be followed in their clinic with re-referral in the event that they were to worsen.  In terms of her breast cancer, she was found to have a new 1.5 cm lesion within the right breast on 06/16/2019.  She has already undergone biopsy of this lesion which shows an ER positive, PR positive, HER-2 negative breast cancer.  Surgical resection was performed on 09/07/2019 which revealed invasive lobular carcinoma.  Started on letrozole therapy on 10/11/2019.  On exam today Catherine Decker is at her baseline level of health  and is tolerating her letrozole therapy quite well.  She had a CT chest abdomen pelvis on 02/28/2020 which showed no evidence of metastatic disease.   #Bicytopenia: Leukopenia and Thrombocytopenia, stable --most likely etiology at this time is the patient's cirrhosis of the liver.  --Labs today show white blood cell count 2.9, hemoglobin 10.1, MCV 102.7, and platelets of 73 --patient has been on vitamin b12 and folate in the interim with no remarkable change in her blood counts. --continue to monitor   #Cirrhosis of the Liver --unclear etiology, thought be 2/2 to  childhood illness with hepatitis --following with [facility] hepatology for evaluation and [place] screening --continue to monitor    #History of Breast Cancer #Recurrence of Breast Cancer: ER+/PR+/HER2- --underwent lumpectomy and radiation therapy in 2000 --declined chemotherapy and only received 6 months of tamoxifen therapy --prior mammogram performed in 2018, new imaging performed 06/2019 --A 1.4 cm mass has been noted in the right breast. Biopsy performed showed 90% ER, 20% PR, and HER2 negative.  --surgical resection of this mass was performed on 09/07/2019 with findings showing invasive lobular carcinoma Nottingham Grade 2 of 3, 2.2 cm. Found to have a deep margin.  No carcinoma noted in lymph nodes.  -- Based on the CALOR Trial ( J Clin Oncol. [phone]) would recommend adjuvant aromatase ihibitor therapy. Patient currently on letrozole 2.3m daily.  --baseline CT C/A/P showed no evidence of metastatic disease. --RTC in 6 months for continued monitoring  #Tremor --patient has a resting tremor and occasional "shakes" --these tremors were not visualized on exam today but reported by the patient --family history of Parkinson's in her father and brother.   Orders Placed This Encounter  Procedures   Ferritin    Standing Status:   Future    Number of Occurrences:   1    Standing Expiration Date:   01/25/2023   Iron and Iron Binding Capacity ([facility] only)    Standing Status:   Future    Number of Occurrences:   1    Standing Expiration Date:   01/25/2023   Retic Panel    Standing Status:   Future    Number of Occurrences:   1    Standing Expiration Date:   01/25/2023   Vitamin B12    Standing Status:   Future    Number of Occurrences:   1    Standing Expiration Date:   01/24/2023   Methylmalonic acid, serum    Standing Status:   Future    Number of Occurrences:   1    Standing Expiration Date:   01/24/2023   Folate, Serum    Standing Status:   Future    Number of  Occurrences:   1    Standing Expiration Date:   01/24/2023    All questions were answered. The patient knows to call the clinic with any problems, questions or concerns.  A total of more than 30 minutes were spent on this encounter and over half of that time was spent on counseling and coordination of care as outlined above.   JLedell Peoples MD Department of Hematology/Oncology [facility] LLCPhone: [phone]: [phone]: [email or web address]  02/02/2022 7:30 PM   Literature Support:   WDonley RedderIVernie Murders et al. Efficacy of chemotherapy for ER-negative and ER-positive isolated locoregional recurrence of breast cancer: final analysis of the CALOR trial. J Clin Oncol. 21027;25:3664-4  --The final analysis of CALOR confirms that chemtotherapy benefits patients  with resected ER-negative ILRR and does not support the use of chemotherapy for ER-positive ILRR.

## 2022-01-27 DIAGNOSIS — E039 Hypothyroidism, unspecified: Secondary | ICD-10-CM | POA: Diagnosis not present

## 2022-01-28 DIAGNOSIS — M7912 Myalgia of auxiliary muscles, head and neck: Secondary | ICD-10-CM | POA: Diagnosis not present

## 2022-01-28 DIAGNOSIS — M546 Pain in thoracic spine: Secondary | ICD-10-CM | POA: Diagnosis not present

## 2022-01-28 DIAGNOSIS — M5412 Radiculopathy, cervical region: Secondary | ICD-10-CM | POA: Diagnosis not present

## 2022-01-28 DIAGNOSIS — M62830 Muscle spasm of back: Secondary | ICD-10-CM | POA: Diagnosis not present

## 2022-01-28 DIAGNOSIS — M5417 Radiculopathy, lumbosacral region: Secondary | ICD-10-CM | POA: Diagnosis not present

## 2022-01-28 DIAGNOSIS — M5413 Radiculopathy, cervicothoracic region: Secondary | ICD-10-CM | POA: Diagnosis not present

## 2022-01-28 DIAGNOSIS — R519 Headache, unspecified: Secondary | ICD-10-CM | POA: Diagnosis not present

## 2022-01-29 LAB — METHYLMALONIC ACID, SERUM: Methylmalonic Acid, Quantitative: 282 nmol/L (ref 0–378)

## 2022-02-04 DIAGNOSIS — M5413 Radiculopathy, cervicothoracic region: Secondary | ICD-10-CM | POA: Diagnosis not present

## 2022-02-04 DIAGNOSIS — M5412 Radiculopathy, cervical region: Secondary | ICD-10-CM | POA: Diagnosis not present

## 2022-02-04 DIAGNOSIS — M546 Pain in thoracic spine: Secondary | ICD-10-CM | POA: Diagnosis not present

## 2022-02-04 DIAGNOSIS — M7912 Myalgia of auxiliary muscles, head and neck: Secondary | ICD-10-CM | POA: Diagnosis not present

## 2022-02-04 DIAGNOSIS — M62830 Muscle spasm of back: Secondary | ICD-10-CM | POA: Diagnosis not present

## 2022-02-04 DIAGNOSIS — M5417 Radiculopathy, lumbosacral region: Secondary | ICD-10-CM | POA: Diagnosis not present

## 2022-02-04 DIAGNOSIS — R519 Headache, unspecified: Secondary | ICD-10-CM | POA: Diagnosis not present

## 2022-02-13 DIAGNOSIS — M5412 Radiculopathy, cervical region: Secondary | ICD-10-CM | POA: Diagnosis not present

## 2022-02-13 DIAGNOSIS — M62830 Muscle spasm of back: Secondary | ICD-10-CM | POA: Diagnosis not present

## 2022-02-13 DIAGNOSIS — M5417 Radiculopathy, lumbosacral region: Secondary | ICD-10-CM | POA: Diagnosis not present

## 2022-02-13 DIAGNOSIS — M546 Pain in thoracic spine: Secondary | ICD-10-CM | POA: Diagnosis not present

## 2022-02-13 DIAGNOSIS — M5413 Radiculopathy, cervicothoracic region: Secondary | ICD-10-CM | POA: Diagnosis not present

## 2022-02-13 DIAGNOSIS — R519 Headache, unspecified: Secondary | ICD-10-CM | POA: Diagnosis not present

## 2022-02-13 DIAGNOSIS — M7912 Myalgia of auxiliary muscles, head and neck: Secondary | ICD-10-CM | POA: Diagnosis not present

## 2022-02-17 DIAGNOSIS — M7912 Myalgia of auxiliary muscles, head and neck: Secondary | ICD-10-CM | POA: Diagnosis not present

## 2022-02-17 DIAGNOSIS — M546 Pain in thoracic spine: Secondary | ICD-10-CM | POA: Diagnosis not present

## 2022-02-17 DIAGNOSIS — M5413 Radiculopathy, cervicothoracic region: Secondary | ICD-10-CM | POA: Diagnosis not present

## 2022-02-17 DIAGNOSIS — M5417 Radiculopathy, lumbosacral region: Secondary | ICD-10-CM | POA: Diagnosis not present

## 2022-02-17 DIAGNOSIS — M62830 Muscle spasm of back: Secondary | ICD-10-CM | POA: Diagnosis not present

## 2022-02-17 DIAGNOSIS — M5412 Radiculopathy, cervical region: Secondary | ICD-10-CM | POA: Diagnosis not present

## 2022-02-17 DIAGNOSIS — R519 Headache, unspecified: Secondary | ICD-10-CM | POA: Diagnosis not present

## 2022-02-19 ENCOUNTER — Ambulatory Visit
Admission: RE | Admit: 2022-02-19 | Discharge: 2022-02-19 | Disposition: A | Discharge status: Home / Self Care | Payer: Medicare HMO | Source: Ambulatory Visit | Attending: Family Medicine | Admitting: Family Medicine

## 2022-02-19 DIAGNOSIS — M8588 Other specified disorders of bone density and structure, other site: Secondary | ICD-10-CM

## 2022-02-19 DIAGNOSIS — M810 Age-related osteoporosis without current pathological fracture: Secondary | ICD-10-CM | POA: Diagnosis not present

## 2022-02-19 DIAGNOSIS — Z780 Asymptomatic menopausal state: Secondary | ICD-10-CM | POA: Diagnosis not present

## 2022-02-25 DIAGNOSIS — M549 Dorsalgia, unspecified: Secondary | ICD-10-CM | POA: Diagnosis not present

## 2022-02-25 DIAGNOSIS — M62830 Muscle spasm of back: Secondary | ICD-10-CM | POA: Diagnosis not present

## 2022-03-14 ENCOUNTER — Emergency Department (HOSPITAL_COMMUNITY): Payer: Medicare HMO

## 2022-03-14 ENCOUNTER — Inpatient Hospital Stay (HOSPITAL_COMMUNITY): Payer: Medicare HMO

## 2022-03-14 ENCOUNTER — Inpatient Hospital Stay (HOSPITAL_COMMUNITY)
Admission: EM | Admit: 2022-03-14 | Discharge: 2022-04-03 | DRG: 477 | Disposition: A | Discharge status: Skilled Nursing Facility | Payer: Medicare HMO | Attending: Internal Medicine | Admitting: Internal Medicine

## 2022-03-14 ENCOUNTER — Other Ambulatory Visit: Payer: Self-pay

## 2022-03-14 ENCOUNTER — Encounter (HOSPITAL_COMMUNITY): Payer: Self-pay

## 2022-03-14 DIAGNOSIS — E785 Hyperlipidemia, unspecified: Secondary | ICD-10-CM | POA: Diagnosis present

## 2022-03-14 DIAGNOSIS — J181 Lobar pneumonia, unspecified organism: Secondary | ICD-10-CM | POA: Diagnosis present

## 2022-03-14 DIAGNOSIS — S2249XA Multiple fractures of ribs, unspecified side, initial encounter for closed fracture: Secondary | ICD-10-CM | POA: Insufficient documentation

## 2022-03-14 DIAGNOSIS — Z803 Family history of malignant neoplasm of breast: Secondary | ICD-10-CM

## 2022-03-14 DIAGNOSIS — Z9842 Cataract extraction status, left eye: Secondary | ICD-10-CM

## 2022-03-14 DIAGNOSIS — K219 Gastro-esophageal reflux disease without esophagitis: Secondary | ICD-10-CM | POA: Diagnosis present

## 2022-03-14 DIAGNOSIS — Z888 Allergy status to other drugs, medicaments and biological substances status: Secondary | ICD-10-CM

## 2022-03-14 DIAGNOSIS — Z961 Presence of intraocular lens: Secondary | ICD-10-CM | POA: Diagnosis present

## 2022-03-14 DIAGNOSIS — L89301 Pressure ulcer of unspecified buttock, stage 1: Secondary | ICD-10-CM | POA: Diagnosis present

## 2022-03-14 DIAGNOSIS — I130 Hypertensive heart and chronic kidney disease with heart failure and stage 1 through stage 4 chronic kidney disease, or unspecified chronic kidney disease: Secondary | ICD-10-CM | POA: Diagnosis present

## 2022-03-14 DIAGNOSIS — E039 Hypothyroidism, unspecified: Secondary | ICD-10-CM | POA: Diagnosis present

## 2022-03-14 DIAGNOSIS — Z9841 Cataract extraction status, right eye: Secondary | ICD-10-CM

## 2022-03-14 DIAGNOSIS — J1008 Influenza due to other identified influenza virus with other specified pneumonia: Secondary | ICD-10-CM | POA: Diagnosis present

## 2022-03-14 DIAGNOSIS — Z79890 Hormone replacement therapy (postmenopausal): Secondary | ICD-10-CM

## 2022-03-14 DIAGNOSIS — E872 Acidosis, unspecified: Secondary | ICD-10-CM | POA: Diagnosis present

## 2022-03-14 DIAGNOSIS — D181 Lymphangioma, any site: Secondary | ICD-10-CM | POA: Diagnosis not present

## 2022-03-14 DIAGNOSIS — S22008A Other fracture of unspecified thoracic vertebra, initial encounter for closed fracture: Secondary | ICD-10-CM | POA: Diagnosis not present

## 2022-03-14 DIAGNOSIS — J09X1 Influenza due to identified novel influenza A virus with pneumonia: Secondary | ICD-10-CM | POA: Diagnosis not present

## 2022-03-14 DIAGNOSIS — W19XXXA Unspecified fall, initial encounter: Secondary | ICD-10-CM

## 2022-03-14 DIAGNOSIS — Z66 Do not resuscitate: Secondary | ICD-10-CM | POA: Diagnosis present

## 2022-03-14 DIAGNOSIS — J189 Pneumonia, unspecified organism: Secondary | ICD-10-CM | POA: Insufficient documentation

## 2022-03-14 DIAGNOSIS — Z6830 Body mass index (BMI) 30.0-30.9, adult: Secondary | ICD-10-CM

## 2022-03-14 DIAGNOSIS — D539 Nutritional anemia, unspecified: Secondary | ICD-10-CM | POA: Diagnosis present

## 2022-03-14 DIAGNOSIS — N179 Acute kidney failure, unspecified: Secondary | ICD-10-CM | POA: Diagnosis present

## 2022-03-14 DIAGNOSIS — E860 Dehydration: Secondary | ICD-10-CM | POA: Diagnosis present

## 2022-03-14 DIAGNOSIS — C801 Malignant (primary) neoplasm, unspecified: Secondary | ICD-10-CM

## 2022-03-14 DIAGNOSIS — E1122 Type 2 diabetes mellitus with diabetic chronic kidney disease: Secondary | ICD-10-CM | POA: Diagnosis present

## 2022-03-14 DIAGNOSIS — K7460 Unspecified cirrhosis of liver: Secondary | ICD-10-CM | POA: Diagnosis present

## 2022-03-14 DIAGNOSIS — E871 Hypo-osmolality and hyponatremia: Secondary | ICD-10-CM | POA: Diagnosis not present

## 2022-03-14 DIAGNOSIS — E119 Type 2 diabetes mellitus without complications: Secondary | ICD-10-CM | POA: Diagnosis not present

## 2022-03-14 DIAGNOSIS — D61818 Other pancytopenia: Secondary | ICD-10-CM | POA: Diagnosis present

## 2022-03-14 DIAGNOSIS — J101 Influenza due to other identified influenza virus with other respiratory manifestations: Secondary | ICD-10-CM | POA: Insufficient documentation

## 2022-03-14 DIAGNOSIS — I5033 Acute on chronic diastolic (congestive) heart failure: Secondary | ICD-10-CM | POA: Diagnosis not present

## 2022-03-14 DIAGNOSIS — W109XXA Fall (on) (from) unspecified stairs and steps, initial encounter: Secondary | ICD-10-CM | POA: Diagnosis present

## 2022-03-14 DIAGNOSIS — S2243XA Multiple fractures of ribs, bilateral, initial encounter for closed fracture: Secondary | ICD-10-CM | POA: Diagnosis present

## 2022-03-14 DIAGNOSIS — E8809 Other disorders of plasma-protein metabolism, not elsewhere classified: Secondary | ICD-10-CM | POA: Diagnosis present

## 2022-03-14 DIAGNOSIS — U071 COVID-19: Principal | ICD-10-CM

## 2022-03-14 DIAGNOSIS — E876 Hypokalemia: Secondary | ICD-10-CM | POA: Diagnosis not present

## 2022-03-14 DIAGNOSIS — L8990 Pressure ulcer of unspecified site, unspecified stage: Secondary | ICD-10-CM | POA: Insufficient documentation

## 2022-03-14 DIAGNOSIS — Z885 Allergy status to narcotic agent status: Secondary | ICD-10-CM

## 2022-03-14 DIAGNOSIS — C7951 Secondary malignant neoplasm of bone: Secondary | ICD-10-CM | POA: Diagnosis present

## 2022-03-14 DIAGNOSIS — Z8249 Family history of ischemic heart disease and other diseases of the circulatory system: Secondary | ICD-10-CM

## 2022-03-14 DIAGNOSIS — Z170 Estrogen receptor positive status [ER+]: Secondary | ICD-10-CM

## 2022-03-14 DIAGNOSIS — S22018A Other fracture of first thoracic vertebra, initial encounter for closed fracture: Principal | ICD-10-CM | POA: Diagnosis present

## 2022-03-14 DIAGNOSIS — N1832 Chronic kidney disease, stage 3b: Secondary | ICD-10-CM | POA: Diagnosis present

## 2022-03-14 DIAGNOSIS — R7881 Bacteremia: Secondary | ICD-10-CM | POA: Diagnosis not present

## 2022-03-14 DIAGNOSIS — I10 Essential (primary) hypertension: Secondary | ICD-10-CM | POA: Diagnosis not present

## 2022-03-14 DIAGNOSIS — S065XAA Traumatic subdural hemorrhage with loss of consciousness status unknown, initial encounter: Secondary | ICD-10-CM

## 2022-03-14 DIAGNOSIS — R609 Edema, unspecified: Secondary | ICD-10-CM | POA: Diagnosis not present

## 2022-03-14 DIAGNOSIS — Z1152 Encounter for screening for COVID-19: Secondary | ICD-10-CM | POA: Diagnosis not present

## 2022-03-14 DIAGNOSIS — E669 Obesity, unspecified: Secondary | ICD-10-CM | POA: Diagnosis present

## 2022-03-14 DIAGNOSIS — K766 Portal hypertension: Secondary | ICD-10-CM | POA: Diagnosis present

## 2022-03-14 DIAGNOSIS — Z9011 Acquired absence of right breast and nipple: Secondary | ICD-10-CM

## 2022-03-14 DIAGNOSIS — R627 Adult failure to thrive: Secondary | ICD-10-CM | POA: Diagnosis not present

## 2022-03-14 DIAGNOSIS — W19XXXD Unspecified fall, subsequent encounter: Secondary | ICD-10-CM | POA: Diagnosis not present

## 2022-03-14 DIAGNOSIS — E8770 Fluid overload, unspecified: Secondary | ICD-10-CM | POA: Diagnosis not present

## 2022-03-14 DIAGNOSIS — R296 Repeated falls: Secondary | ICD-10-CM | POA: Diagnosis present

## 2022-03-14 DIAGNOSIS — Z9851 Tubal ligation status: Secondary | ICD-10-CM

## 2022-03-14 DIAGNOSIS — D696 Thrombocytopenia, unspecified: Secondary | ICD-10-CM | POA: Insufficient documentation

## 2022-03-14 DIAGNOSIS — Z79899 Other long term (current) drug therapy: Secondary | ICD-10-CM

## 2022-03-14 DIAGNOSIS — Z9049 Acquired absence of other specified parts of digestive tract: Secondary | ICD-10-CM

## 2022-03-14 DIAGNOSIS — Z853 Personal history of malignant neoplasm of breast: Secondary | ICD-10-CM

## 2022-03-14 DIAGNOSIS — C50411 Malignant neoplasm of upper-outer quadrant of right female breast: Secondary | ICD-10-CM | POA: Diagnosis not present

## 2022-03-14 LAB — LACTIC ACID, PLASMA
Lactic Acid, Venous: 4 mmol/L (ref 0.5–1.9)
Lactic Acid, Venous: 2.2 mmol/L (ref 0.5–1.9)

## 2022-03-14 LAB — CBC
HCT: 29.3 % — ABNORMAL LOW (ref 36.0–46.0)
Hemoglobin: 9.5 g/dL — ABNORMAL LOW (ref 12.0–15.0)
MCH: 32.6 pg (ref 26.0–34.0)
MCHC: 32.4 g/dL (ref 30.0–36.0)
MCV: 100.7 fL — ABNORMAL HIGH (ref 80.0–100.0)
Platelets: 59 10*3/uL — ABNORMAL LOW (ref 150–400)
RBC: 2.91 MIL/uL — ABNORMAL LOW (ref 3.87–5.11)
RDW: 15.4 % (ref 11.5–15.5)
WBC: 7.1 10*3/uL (ref 4.0–10.5)
nRBC: 0 % (ref 0.0–0.2)

## 2022-03-14 LAB — COMPREHENSIVE METABOLIC PANEL
ALT: 32 U/L (ref 0–44)
AST: 88 U/L — ABNORMAL HIGH (ref 15–41)
Albumin: 2.8 g/dL — ABNORMAL LOW (ref 3.5–5.0)
Alkaline Phosphatase: 109 U/L (ref 38–126)
Anion gap: 9 (ref 5–15)
BUN: 35 mg/dL — ABNORMAL HIGH (ref 8–23)
CO2: 17 mmol/L — ABNORMAL LOW (ref 22–32)
Calcium: 8.8 mg/dL — ABNORMAL LOW (ref 8.9–10.3)
Chloride: 110 mmol/L (ref 98–111)
Creatinine, Ser: 2.24 mg/dL — ABNORMAL HIGH (ref 0.44–1.00)
GFR, Estimated: 22 mL/min — ABNORMAL LOW (ref >60)
Glucose, Bld: 155 mg/dL — ABNORMAL HIGH (ref 70–99)
Potassium: 4.3 mmol/L (ref 3.5–5.1)
Sodium: 136 mmol/L (ref 135–145)
Total Bilirubin: 2.9 mg/dL — ABNORMAL HIGH (ref 0.3–1.2)
Total Protein: 6.3 g/dL — ABNORMAL LOW (ref 6.5–8.1)

## 2022-03-14 LAB — RESP PANEL BY RT-PCR (RSV, FLU A&B, COVID)  RVPGX2
Influenza A by PCR: POSITIVE — AB
Influenza B by PCR: NEGATIVE
Resp Syncytial Virus by PCR: NEGATIVE
SARS Coronavirus 2 by RT PCR: NEGATIVE

## 2022-03-14 LAB — AMMONIA: Ammonia: 32 umol/L (ref 9–35)

## 2022-03-14 LAB — TSH: TSH: 5.716 u[IU]/mL — ABNORMAL HIGH (ref 0.350–4.500)

## 2022-03-14 LAB — CBG MONITORING, ED
Glucose-Capillary: 134 mg/dL — ABNORMAL HIGH (ref 70–99)
Glucose-Capillary: 121 mg/dL — ABNORMAL HIGH (ref 70–99)

## 2022-03-14 LAB — T4, FREE: Free T4: 1.01 ng/dL (ref 0.61–1.12)

## 2022-03-14 LAB — GLUCOSE, CAPILLARY: Glucose-Capillary: 102 mg/dL — ABNORMAL HIGH (ref 70–99)

## 2022-03-14 MED ORDER — HEPARIN SODIUM (PORCINE) 5000 UNIT/ML IJ SOLN: 5000.0000 [IU] | Freq: Three times a day (TID) | INTRAMUSCULAR | Status: DC

## 2022-03-14 MED ORDER — PANTOPRAZOLE SODIUM 40 MG PO TBEC
40.0000 mg | DELAYED_RELEASE_TABLET | Freq: Every day | ORAL | Status: DC
  Administered 2022-03-15 – 2022-04-03 (×20): 40 mg via ORAL
  Filled 2022-03-14 (×20): qty 1

## 2022-03-14 MED ORDER — SODIUM CHLORIDE 0.9 % IV SOLN
2.0000 g | INTRAVENOUS | Status: AC
  Administered 2022-03-15 – 2022-03-19 (×5): 2 g via INTRAVENOUS
  Filled 2022-03-14 (×6): qty 20

## 2022-03-14 MED ORDER — DOCUSATE SODIUM 100 MG PO CAPS
100.0000 mg | ORAL_CAPSULE | Freq: Two times a day (BID) | ORAL | Status: DC
  Administered 2022-03-14 – 2022-03-15 (×2): 100 mg via ORAL
  Filled 2022-03-14 (×4): qty 1

## 2022-03-14 MED ORDER — FOLIC ACID 1 MG PO TABS
1.0000 mg | ORAL_TABLET | Freq: Every day | ORAL | Status: DC
  Administered 2022-03-15 – 2022-04-03 (×20): 1 mg via ORAL
  Filled 2022-03-14 (×20): qty 1

## 2022-03-14 MED ORDER — IPRATROPIUM-ALBUTEROL 0.5-2.5 (3) MG/3ML IN SOLN
3.0000 mL | RESPIRATORY_TRACT | Status: DC
  Administered 2022-03-14 (×2): 3 mL via RESPIRATORY_TRACT
  Filled 2022-03-14 (×2): qty 3

## 2022-03-14 MED ORDER — MORPHINE SULFATE (PF) 2 MG/ML IV SOLN: 1.0000 mg | INTRAVENOUS | Status: DC | PRN

## 2022-03-14 MED ORDER — ACETAMINOPHEN 500 MG PO TABS
1000.0000 mg | ORAL_TABLET | Freq: Four times a day (QID) | ORAL | Status: DC
  Administered 2022-03-14 – 2022-03-27 (×47): 1000 mg via ORAL
  Filled 2022-03-14 (×50): qty 2

## 2022-03-14 MED ORDER — INSULIN ASPART 100 UNIT/ML IJ SOLN
0.0000 [IU] | Freq: Three times a day (TID) | INTRAMUSCULAR | Status: DC
  Filled 2022-03-14: qty 0.06

## 2022-03-14 MED ORDER — OXYCODONE HCL 5 MG PO TABS
2.5000 mg | ORAL_TABLET | ORAL | Status: DC | PRN
  Administered 2022-03-16 – 2022-03-17 (×3): 5 mg via ORAL
  Filled 2022-03-14 (×3): qty 1

## 2022-03-14 MED ORDER — SODIUM CHLORIDE 0.9 % IV SOLN
500.0000 mg | INTRAVENOUS | Status: DC
  Administered 2022-03-15 – 2022-03-17 (×3): 500 mg via INTRAVENOUS
  Filled 2022-03-14 (×3): qty 5

## 2022-03-14 MED ORDER — SODIUM CHLORIDE 0.9 % IV SOLN
1.0000 g | Freq: Once | INTRAVENOUS | Status: AC
  Administered 2022-03-14: 1 g via INTRAVENOUS
  Filled 2022-03-14: qty 10

## 2022-03-14 MED ORDER — METHOCARBAMOL 500 MG PO TABS
1000.0000 mg | ORAL_TABLET | Freq: Three times a day (TID) | ORAL | Status: DC
  Administered 2022-03-14 – 2022-04-03 (×60): 1000 mg via ORAL
  Filled 2022-03-14 (×60): qty 2

## 2022-03-14 MED ORDER — VITAMIN B-12 1000 MCG PO TABS
1000.0000 ug | ORAL_TABLET | Freq: Every day | ORAL | Status: DC
  Administered 2022-03-15 – 2022-04-03 (×20): 1000 ug via ORAL
  Filled 2022-03-14 (×20): qty 1

## 2022-03-14 MED ORDER — VITAMIN D 25 MCG (1000 UNIT) PO TABS
1000.0000 [IU] | ORAL_TABLET | Freq: Every day | ORAL | Status: DC
  Administered 2022-03-15 – 2022-04-03 (×20): 1000 [IU] via ORAL
  Filled 2022-03-14 (×20): qty 1

## 2022-03-14 MED ORDER — IPRATROPIUM-ALBUTEROL 0.5-2.5 (3) MG/3ML IN SOLN
3.0000 mL | RESPIRATORY_TRACT | Status: DC | PRN
  Filled 2022-03-14: qty 3

## 2022-03-14 MED ORDER — GUAIFENESIN ER 600 MG PO TB12
600.0000 mg | ORAL_TABLET | Freq: Two times a day (BID) | ORAL | Status: DC
  Administered 2022-03-14 – 2022-03-16 (×4): 600 mg via ORAL
  Filled 2022-03-14 (×4): qty 1

## 2022-03-14 MED ORDER — LACTATED RINGERS IV SOLN: INTRAVENOUS | Status: DC

## 2022-03-14 MED ORDER — LEVOTHYROXINE SODIUM 112 MCG PO TABS
112.0000 ug | ORAL_TABLET | Freq: Every day | ORAL | Status: DC
  Administered 2022-03-15 – 2022-04-03 (×19): 112 ug via ORAL
  Filled 2022-03-14 (×20): qty 1

## 2022-03-14 MED ORDER — METHOCARBAMOL 1000 MG/10ML IJ SOLN: 1000.0000 mg | Freq: Three times a day (TID) | INTRAVENOUS | Status: DC

## 2022-03-14 MED ORDER — SODIUM CHLORIDE 0.9 % IV SOLN
500.0000 mg | Freq: Once | INTRAVENOUS | Status: AC
  Administered 2022-03-14: 500 mg via INTRAVENOUS
  Filled 2022-03-14: qty 5

## 2022-03-14 MED ORDER — CALCIUM CARBONATE 1250 (500 CA) MG PO TABS
1250.0000 mg | ORAL_TABLET | Freq: Every day | ORAL | Status: DC
  Administered 2022-03-15 – 2022-04-03 (×19): 1250 mg via ORAL
  Filled 2022-03-14 (×21): qty 1

## 2022-03-14 NOTE — ED Triage Notes (Addendum)
Patient's daughter reports that the patient had a fall yesterday and fell down 4 steps. Daughter reports that the patient has had "a cold" x 1 week. Patient's  daughter reports that the patient is slightly confused, having diarrhea, and having increased weakness  Patient c/o bilateral rib pain. Patient denies hitting her head or having LOC.  Daughter also reports that the patient has not had her Thyroid medication x 2 weeks.

## 2022-03-14 NOTE — ED Provider Triage Note (Addendum)
Emergency Medicine Provider Triage Evaluation Note  Catherine Decker , a 82 y.o. female  was evaluated in triage.  Pt complains of rib pain after mechanical fall yesterday. Fell down 4 steps. Daughter says she "a cold" last week, still having symptoms of cough, fatigue, diarrhea, decreased appetite. Daughter reports she's been more confused. No head trauma, no LOC. Not on blood thinners.  Daughter reports pt has not taken thyroid medication in 2 weeks  Review of Systems  Positive: Rib pain, nausea, vomiting, diarrhea, cough, weakness, SOB Negative: Fever, abd pain  Physical Exam  BP (!) 138/47 (BP Location: Left Arm)   Pulse 91   Temp 98.8 F (37.1 C) (Oral)   Resp 18   Ht '5\' 3"'$  (1.6 m)   Wt 73.5 kg   SpO2 100%   BMI 28.70 kg/m  Gen:   Awake, no distress   Resp:  Normal effort  MSK:   Moves extremities without difficulty  Other:    Medical Decision Making  Medically screening exam initiated at 11:09 AM.  Appropriate orders placed.  Catherine Decker was informed that the remainder of the evaluation will be completed by another provider, this initial triage assessment does not replace that evaluation, and the importance of remaining in the ED until their evaluation is complete.  Workup initiated   Catherine Plummer, PA-C 03/14/22 1111    Catherine Clary T, PA-C 03/14/22 1111

## 2022-03-14 NOTE — Consult Note (Signed)
Catherine Decker 1941/01/31  [id number].    Requesting MD: Sherry Ruffing, MD Chief Complaint/Reason for Consult: fall, rib fractures, SDH, and thoracic spine fractures   HPI:  Catherine Decker is an 82 y/o F with PMH hypothyroidism and breast cancer who presents for an evaluation after a fall yesterday. Patient reports falling down 4 steps, denies hitting her head or LOC. Her cc is bilateral chest wall pain.  The patient and her family report increased confusion the last couple of days which they attribute to her having a bad cold. She reports 2 weeks of cough/congestion that have progressed to include decreased PO intake, fatigue, post-tussive emesis, and diarrhea.  Patient denies extremity pain, visual changes, abdominal pain, or urinary sxs.  Denies use of blood thinners. Denies tobacco, alcohol, or drug use   ROS: As Above  Review of Systems  All other systems reviewed and are negative.   Family History  Problem Relation Age of Onset   Diabetes Mother    Heart disease Mother    Hypertension Mother    Breast cancer Mother    Heart disease Father    Heart attack Father    Prostate cancer Father    Hypertension Sister    Varicose Veins Sister    Hyperlipidemia Sister    Colon cancer Sister    Heart disease Brother    Diabetes Brother    Hypertension Brother    Prostate cancer Brother    Cirrhosis Brother    Hypertension Brother    Hypertension Daughter    Hypertension Son    Liver cancer Neg Hx    Osteoporosis Neg Hx     Past Medical History:  Diagnosis Date   Anemia    Arthritis of back    Arthritis of hand    per patient right hand   Autoimmune deficiency syndrome ([place])    per patient   Breast cancer ([place])    Cancer ([place]) 08/2019   right breast ILC   Cirrhosis ([place])    Cirrhosis of liver ([place])    Complication of anesthesia    per patient, slow to awake   Diabetes mellitus    GERD (gastroesophageal reflux disease)    Also, Hiatal Hernia    H/O migraine     Hepatitis    per patient, had infectious hepatitis as a 82 year old   Hyperlipidemia    Hypertension    Hypothyroidism    Splenic artery aneurysm ([place])     Past Surgical History:  Procedure Laterality Date   APPENDECTOMY     BREAST LUMPECTOMY     Right breast lumpectomy   CATARACT EXTRACTION W/ INTRAOCULAR LENS  IMPLANT, BILATERAL  2018   CHOLECYSTECTOMY  04/2010   MASTECTOMY W/ SENTINEL NODE BIOPSY Right 09/07/2019   Procedure: RIGHT SIMPLE MASTECTOMY WITH SENTINEL LYMPH NODE MAPPING;  Surgeon: Erroll Luna, MD;  Location: [facility];  Service: General;  Laterality: Right;  PEC BLOCK   SCAR REVISION Right 06/21/2020   Procedure: SCAR REVISION OF RIGHT MASTECTOMY;  Surgeon: Erroll Luna, MD;  Location: [facility];  Service: General;  Laterality: Right;   TONSILLECTOMY     TUBAL LIGATION  1975    Social History:  reports that she has never smoked. She has never used smokeless tobacco. She reports that she does not drink alcohol and does not use drugs.  Allergies:  Allergies  Allergen Reactions   Demerol Anaphylaxis   Meperidine Anaphylaxis   Meperidine Hcl Other (See  Comments)    (Not in a hospital admission)    Physical Exam: Blood pressure (!) 130/59, pulse 81, temperature 97.7 F (36.5 C), temperature source Oral, resp. rate 18, height '5\' 3"'$  (1.6 m), weight 73.5 kg, SpO2 100 %. General: pleasant, WD, WN white female who is sitting up in bed in NAD HEENT: head is normocephalic, atraumatic.  Sclera are noninjected.  PERRL.  Ears and nose without any masses or lesions.  Mouth is pink and moist.  Neck is nontender with normal ROM. Heart: regular, rate, and rhythm.  Normal s1,s2. No obvious murmurs, gallops, or rubs noted.  Palpable radial and pedal pulses bilaterally Lungs: CTAB, no wheezes, rhonchi, or rales noted.  Respiratory effort nonlabored on RA.  She has chest wall tenderness posteriorly with ecchymosis left lateral and some over midline and on the  right as well, but largest is over her left posterior shoulder.  Chest anteriorly is not tender and with right mastectomy. Abd: soft, NT, ND, +BS, no masses, hernias, or organomegaly MS: all 4 extremities are symmetrical with no cyanosis, clubbing, or edema.  She has normal ROM of all extremities and joints.  No bruising or edema noted. Skin: warm and dry with no masses, lesions, or rashes Neuro: Cranial nerves 2-12 grossly intact, sensation is normal throughout Psych: A&Ox3 with an appropriate affect.   Results for orders placed or performed during the hospital encounter of 03/14/22 (from the past 48 hour(s))  Resp panel by RT-PCR (RSV, Flu A&B, Covid) Anterior Nasal Swab     Status: Abnormal   Collection Time: 03/14/22 11:12 AM   Specimen: Anterior Nasal Swab  Result Value Ref Range   SARS Coronavirus 2 by RT PCR NEGATIVE NEGATIVE    Comment: (NOTE) SARS-CoV-2 target nucleic acids are NOT DETECTED.  The SARS-CoV-2 RNA is generally detectable in upper respiratory specimens during the acute phase of infection. The lowest concentration of SARS-CoV-2 viral copies this assay can detect is 138 copies/mL. A negative result does not preclude SARS-Cov-2 infection and should not be used as the sole basis for treatment or other patient management decisions. A negative result may occur with  improper specimen collection/handling, submission of specimen other than nasopharyngeal swab, presence of viral mutation(s) within the areas targeted by this assay, and inadequate number of viral copies(<138 copies/mL). A negative result must be combined with clinical observations, patient history, and epidemiological information. The expected result is Negative.  Fact Sheet for Patients:  EntrepreneurPulse.com.au  Fact Sheet for Healthcare Providers:  IncredibleEmployment.be  This test is no t yet approved or cleared by the [place] FDA and  has been authorized  for detection and/or diagnosis of SARS-CoV-2 by FDA under an Emergency Use Authorization (EUA). This EUA will remain  in effect (meaning this test can be used) for the duration of the COVID-19 declaration under Section 564(b)(1) of the Act, 21 U.S.C.section 360bbb-3(b)(1), unless the authorization is terminated  or revoked sooner.       Influenza A by PCR POSITIVE (A) NEGATIVE   Influenza B by PCR NEGATIVE NEGATIVE    Comment: (NOTE) The Xpert Xpress SARS-CoV-2/FLU/RSV plus assay is intended as an aid in the diagnosis of influenza from Nasopharyngeal swab specimens and should not be used as a sole basis for treatment. Nasal washings and aspirates are unacceptable for Xpert Xpress SARS-CoV-2/FLU/RSV testing.  Fact Sheet for Patients: EntrepreneurPulse.com.au  Fact Sheet for Healthcare Providers: IncredibleEmployment.be  This test is not yet approved or cleared by the [place] FDA and has  been authorized for detection and/or diagnosis of SARS-CoV-2 by FDA under an Emergency Use Authorization (EUA). This EUA will remain in effect (meaning this test can be used) for the duration of the COVID-19 declaration under Section 564(b)(1) of the Act, 21 U.S.C. section 360bbb-3(b)(1), unless the authorization is terminated or revoked.     Resp Syncytial Virus by PCR NEGATIVE NEGATIVE    Comment: (NOTE) Fact Sheet for Patients: EntrepreneurPulse.com.au  Fact Sheet for Healthcare Providers: IncredibleEmployment.be  This test is not yet approved or cleared by the [place] FDA and has been authorized for detection and/or diagnosis of SARS-CoV-2 by FDA under an Emergency Use Authorization (EUA). This EUA will remain in effect (meaning this test can be used) for the duration of the COVID-19 declaration under Section 564(b)(1) of the Act, 21 U.S.C. section 360bbb-3(b)(1), unless the authorization is terminated  or revoked.  Performed at [facility], [place] [address] [place], [place] [address]   CBC     Status: Abnormal   Collection Time: 03/14/22  1:01 PM  Result Value Ref Range   WBC 7.1 4.0 - 10.5 K/uL   RBC 2.91 (L) 3.87 - 5.11 MIL/uL   Hemoglobin 9.5 (L) 12.0 - 15.0 g/dL   HCT 29.3 (L) 36.0 - 46.0 %   MCV 100.7 (H) 80.0 - 100.0 fL   MCH 32.6 26.0 - 34.0 pg   MCHC 32.4 30.0 - 36.0 g/dL   RDW 15.4 11.5 - 15.5 %   Platelets 59 (L) 150 - 400 K/uL    Comment: SPECIMEN CHECKED FOR CLOTS Immature Platelet Fraction may be clinically indicated, consider ordering this additional test [id number] REPEATED TO VERIFY PLATELET COUNT CONFIRMED BY SMEAR    nRBC 0.0 0.0 - 0.2 %    Comment: Performed at [facility], [place] [address], [place], [place] [address]  TSH     Status: Abnormal   Collection Time: 03/14/22  1:01 PM  Result Value Ref Range   TSH 5.716 (H) 0.350 - 4.500 uIU/mL    Comment: Performed by a 3rd Generation assay with a functional sensitivity of <=0.01 uIU/mL. Performed at [facility], [place] [address], [place], [place] [address]   Comprehensive metabolic panel     Status: Abnormal   Collection Time: 03/14/22  1:01 PM  Result Value Ref Range   Sodium 136 135 - 145 mmol/L   Potassium 4.3 3.5 - 5.1 mmol/L   Chloride 110 98 - 111 mmol/L   CO2 17 (L) 22 - 32 mmol/L   Glucose, Bld 155 (H) 70 - 99 mg/dL    Comment: Glucose reference range applies only to samples taken after fasting for at least 8 hours.   BUN 35 (H) 8 - 23 mg/dL   Creatinine, Ser 2.24 (H) 0.44 - 1.00 mg/dL   Calcium 8.8 (L) 8.9 - 10.3 mg/dL   Total Protein 6.3 (L) 6.5 - 8.1 g/dL   Albumin 2.8 (L) 3.5 - 5.0 g/dL   AST 88 (H) 15 - 41 U/L   ALT 32 0 - 44 U/L   Alkaline Phosphatase 109 38 - 126 U/L   Total Bilirubin 2.9 (H) 0.3 - 1.2 mg/dL   GFR, Estimated 22 (L) >60 mL/min    Comment: (NOTE) Calculated using the CKD-EPI Creatinine Equation (2021)    Anion  gap 9 5 - 15    Comment: Performed at [facility], [place] [address], [place], [place] [address]  Ammonia     Status:  None   Collection Time: 03/14/22  1:01 PM  Result Value Ref Range   Ammonia 32 9 - 35 umol/L    Comment: Performed at [facility], [place] [address] [place], [place] [address]  Lactic acid, plasma     Status: Abnormal   Collection Time: 03/14/22  1:01 PM  Result Value Ref Range   Lactic Acid, Venous 4.0 (HH) 0.5 - 1.9 mmol/L    Comment: CRITICAL RESULT CALLED TO, READ BACK BY AND VERIFIED WITH R. GROVES, RN 03/14/22 1402 J. COLE Performed at [facility], [place] [address] [place], [place] [address]   CBG monitoring, ED     Status: Abnormal   Collection Time: 03/14/22  1:22 PM  Result Value Ref Range   Glucose-Capillary 134 (H) 70 - 99 mg/dL    Comment: Glucose reference range applies only to samples taken after fasting for at least 8 hours.   CT CHEST ABDOMEN PELVIS WO CONTRAST  Result Date: 03/14/2022 CLINICAL DATA:  Fall yesterday. Rib pain. Prior mastectomy for right breast cancer. Known cirrhosis. * Tracking Code: BO * EXAM: CT CHEST, ABDOMEN AND PELVIS WITHOUT CONTRAST TECHNIQUE: Multidetector CT imaging of the chest, abdomen and pelvis was performed following the standard protocol without IV contrast. RADIATION DOSE REDUCTION: This exam was performed according to the departmental dose-optimization program which includes automated exposure control, adjustment of the mA and/or kV according to patient size and/or use of iterative reconstruction technique. COMPARISON:  02/28/2020 FINDINGS: CT CHEST FINDINGS Cardiovascular: Mild degradation secondary to arm position, not raised above the head. Lack of IV contrast. Aortic atherosclerosis. Tortuous thoracic aorta. Normal heart size, without pericardial effusion. Lad coronary artery calcification. Mediastinum/Nodes: Right axillary node dissection. No axillary or subpectoral  adenopathy. Calcified mediastinal and right hilar nodes are likely related to old granulomatous disease. No mediastinal or hilar adenopathy, given limitations of unenhanced CT. Lungs/Pleura: No pleural fluid.  Mild left base scarring. Inferior right middle lobe and dependent right lower lobe areas of consolidation. Peribronchovascular nodularity within the more central right middle and right lower lobes. Example 9 mm in the right lower lobe on 81/5. These areas are all new since 02/28/2020. No pneumothorax. Musculoskeletal: Right mastectomy. Osteopenia. Minimally displaced fourth and fifth lateral left rib fractures including on 17 and 21 of series 3 are acute. The seventh lateral right rib fracture is new in the interval but partially healed. An eighth posterolateral right rib fracture is displaced, of indeterminate acuity but unhealed on 39/3. Mild superior endplate compression deformity at T4 is new in the interval. A moderate T7 compression deformity is without ventral canal encroachment and demonstrates heterogeneous increased density within. Heterogeneous increased density within the right acromion on 05/03. CT ABDOMEN PELVIS FINDINGS Hepatobiliary: Advanced cirrhosis. Left hepatic lobe hypoattenuation centrally could represent an enlarged left portal vein when correlated with prior contrast enhanced exam but is indeterminate on 55/3. Cholecystectomy, without biliary ductal dilatation. Pancreas: Normal, without mass or ductal dilatation. Spleen: Splenomegaly at 13.9 cm craniocaudal. Old granulomatous disease within. Adrenals/Urinary Tract: Normal adrenal glands. Mild renal cortical thinning bilaterally. No hydronephrosis. Normal urinary bladder. Stomach/Bowel: Proximal gastric underdistention. Extensive colonic diverticulosis. Sigmoid wall thickening is typical of muscular hypertrophy. Normal terminal ileum and appendix. Normal small bowel. Vascular/Lymphatic: Advanced aortic and branch vessel atherosclerosis.  Splenic artery aneurysm of 1.2 cm is not significantly changed. Portal venous hypertension, with a recanalized paraumbilical vein and abdominal wall collaterals. No abdominopelvic adenopathy. Reproductive: Hysterectomy.  No adnexal mass. Other: Small volume abdominopelvic fluid,  new. No free intraperitoneal air. Musculoskeletal: Osteopenia. Left iliac lytic lesion of 2.6 cm on 88/3, new. A mild superior endplate compression deformity at L3 is of indeterminate acuity. IMPRESSION: 1. Low sensitivity exam secondary to lack of oral or IV contrast and patient arm position, not raised above the head. 2. Osseous metastasis, most apparent as a lytic lesion in the left iliac wing. Suspicion of right acromion process metastasis as well. 3. Bilateral rib fractures, acute on the left and of indeterminate acuity on the right. 4. Thoracolumbar compression deformities, new since 02/27/2021,. Of indeterminate acuity. Cannot exclude pathologic fracture, including at T7. Consider nonemergent pre and postcontrast thoracic and possibly lumbar spine MRI. 5. Right lower and right middle lobe consolidation, favoring pneumonia. Concurrent peribronchovascular nodularity which is also most likely infectious. Somewhat atypical appearance of pulmonary metastasis or primary bronchogenic carcinoma felt less likely. Recommend follow-up on chest CT at 6-8 weeks. 6. Cirrhosis and portal venous hypertension with new small volume abdominopelvic ascites. Hypoattenuation in the central left lower lobe could represent an enlarged left portal vein but is indeterminate on this noncontrast exam. This could be further evaluated with nonemergent pre and post contrast abdominal MRI. Alternatively, if the patient is eventually scheduled for PET, this would likely be informative. 7. Similar 12 mm splenic artery aneurysm. 8. Incidental findings, including: Coronary artery atherosclerosis. Aortic Atherosclerosis (ICD10-I70.0). Electronically Signed   By: Abigail Miyamoto M.D.   On: 03/14/2022 14:49   CT Head Wo Contrast  Result Date: 03/14/2022 CLINICAL DATA:  Status post fall EXAM: CT HEAD WITHOUT CONTRAST CT CERVICAL SPINE WITHOUT CONTRAST TECHNIQUE: Multidetector CT imaging of the head and cervical spine was performed following the standard protocol without intravenous contrast. Multiplanar CT image reconstructions of the cervical spine were also generated. RADIATION DOSE REDUCTION: This exam was performed according to the departmental dose-optimization program which includes automated exposure control, adjustment of the mA and/or kV according to patient size and/or use of iterative reconstruction technique. COMPARISON:  None Available. FINDINGS: CT HEAD FINDINGS Brain: Bilateral low-attenuation sub dural fluid collections overlie the cerebral image spheres compatible with chronic subdural hygromas. Overlying the right frontoparietal convexity there is a 4 mm intermediate density subpleural fluid subdural fluid collection which measures Hounsfield units between 50 and 60, image 28/6 and image 26/5. No definite hyperdense subdural fluid collections identified. There is no significant midline shift or mass effect. No signs of acute brain infarct. There is mild diffuse low-attenuation within the subcortical and periventricular white matter compatible with chronic microvascular disease. Vascular: No hyperdense vessel or unexpected calcification. Skull: Normal. Negative for fracture or focal lesion. Sinuses/Orbits: No acute finding. Other: None CT CERVICAL SPINE FINDINGS Alignment: Normal. Skull base and vertebrae: There is an acute mildly displaced fracture involving the posterior spinous process T1, image 71/5. Lucent lesion is identified within the posterior aspect of the T1 vertebral body measuring 1.4 cm, image 73/5. Soft tissues and spinal canal: No prevertebral fluid or swelling. No visible canal hematoma. Disc levels: Multilevel disc space narrowing and endplate  spurring noted within the cervical spine. Upper chest: No pneumothorax identified within the lung apices. Permeative appearance of the right coracoid process of the right scapula, image 79/5. Other: None IMPRESSION: 1. Bilateral low-attenuation sub dural fluid collections overlie the cerebral image spheres compatible with chronic subdural hygromas. 2. Overlying the right frontoparietal convexity there is a 4 mm intermediate density subpleural fluid collection which measures Hounsfield units between 50 and 60. This is favored to represent a subacute subdural hematoma.  3. Acute mildly displaced fracture involving the posterior spinous process of T1. 4. Lucent lesion is identified within the posterior aspect of the T1 vertebral body measuring 1.4 cm. Permeative appearance of the right coracoid process of the right scapula. Findings are concerning for metastatic disease. 5. Chronic small vessel ischemic disease and brain atrophy. Critical Value/emergent results were called by telephone at the time of interpretation on 03/14/2022 at 2:43 pm to provider [facility] , who verbally acknowledged these results. Electronically Signed   By: Kerby Moors M.D.   On: 03/14/2022 14:44   CT Cervical Spine Wo Contrast  Result Date: 03/14/2022 CLINICAL DATA:  Status post fall EXAM: CT HEAD WITHOUT CONTRAST CT CERVICAL SPINE WITHOUT CONTRAST TECHNIQUE: Multidetector CT imaging of the head and cervical spine was performed following the standard protocol without intravenous contrast. Multiplanar CT image reconstructions of the cervical spine were also generated. RADIATION DOSE REDUCTION: This exam was performed according to the departmental dose-optimization program which includes automated exposure control, adjustment of the mA and/or kV according to patient size and/or use of iterative reconstruction technique. COMPARISON:  None Available. FINDINGS: CT HEAD FINDINGS Brain: Bilateral low-attenuation sub dural fluid  collections overlie the cerebral image spheres compatible with chronic subdural hygromas. Overlying the right frontoparietal convexity there is a 4 mm intermediate density subpleural fluid subdural fluid collection which measures Hounsfield units between 50 and 60, image 28/6 and image 26/5. No definite hyperdense subdural fluid collections identified. There is no significant midline shift or mass effect. No signs of acute brain infarct. There is mild diffuse low-attenuation within the subcortical and periventricular white matter compatible with chronic microvascular disease. Vascular: No hyperdense vessel or unexpected calcification. Skull: Normal. Negative for fracture or focal lesion. Sinuses/Orbits: No acute finding. Other: None CT CERVICAL SPINE FINDINGS Alignment: Normal. Skull base and vertebrae: There is an acute mildly displaced fracture involving the posterior spinous process T1, image 71/5. Lucent lesion is identified within the posterior aspect of the T1 vertebral body measuring 1.4 cm, image 73/5. Soft tissues and spinal canal: No prevertebral fluid or swelling. No visible canal hematoma. Disc levels: Multilevel disc space narrowing and endplate spurring noted within the cervical spine. Upper chest: No pneumothorax identified within the lung apices. Permeative appearance of the right coracoid process of the right scapula, image 79/5. Other: None IMPRESSION: 1. Bilateral low-attenuation sub dural fluid collections overlie the cerebral image spheres compatible with chronic subdural hygromas. 2. Overlying the right frontoparietal convexity there is a 4 mm intermediate density subpleural fluid collection which measures Hounsfield units between 50 and 60. This is favored to represent a subacute subdural hematoma. 3. Acute mildly displaced fracture involving the posterior spinous process of T1. 4. Lucent lesion is identified within the posterior aspect of the T1 vertebral body measuring 1.4 cm. Permeative  appearance of the right coracoid process of the right scapula. Findings are concerning for metastatic disease. 5. Chronic small vessel ischemic disease and brain atrophy. Critical Value/emergent results were called by telephone at the time of interpretation on 03/14/2022 at 2:43 pm to provider [facility] , who verbally acknowledged these results. Electronically Signed   By: Kerby Moors M.D.   On: 03/14/2022 14:44   DG Chest 2 View  Result Date: 03/14/2022 CLINICAL DATA:  Fall, rib pain, cough, and shortness of breath. EXAM: CHEST - 2 VIEW COMPARISON:  Chest radiographs 08/07/2009 and CT 02/28/2020 FINDINGS: The cardiomediastinal silhouette is unchanged with normal heart size. The lungs are hyperinflated. New patchy opacity is present in  the medial right lung base. The left lung is clear. There could be trace pleural effusions. No pneumothorax is identified. There is a mildly displaced posterolateral right eighth rib fracture, and a non or minimally displaced seventh rib fracture is also suspected. Surgical clips are noted in the right axilla. There is a midthoracic vertebral compression fracture with moderate vertebral body height loss which is new from 2021 and of indeterminate age. IMPRESSION: 1. Right seventh and eighth rib fractures. No pneumothorax. 2. New medial right basilar airspace opacity suspicious for pneumonia. Followup PA and lateral chest radiographs are recommended in 3-4 weeks following trial of antibiotic therapy to ensure resolution and exclude underlying malignancy. 3. Possible trace pleural effusions. 4. Age-indeterminate midthoracic vertebral compression fracture. Electronically Signed   By: Logan Bores M.D.   On: 03/14/2022 12:02      Assessment/Plan Fall down 4 steps  Left Rib FX 4-5, acute - multimodal pain control, IS, pulm toilet Right Rib FX 7-8, acute vs subacute -  multimodal pain control, IS, pulm toilet Superior endplate deformity T4, T7 compression fracture,  unclear acute vs subacute or chronic - NSGY spoke to EDP and no bracing needed T1 spinous process fracture - see above SDH/hygromas - NSGY does not feel this is acute.  No intervention needed  As above, recommend pain control and incentive spirometer use secondary to the rib fractures to help prevent a worsening of her PNA.  She is actually sating 100% on RA and coughing well with good deep breathes which is encouraging.  Plan discussed with patient and daughter at the bedside.  We are available as needed.  Patient can follow up with PCP as needed for rib fractures as an outpatient.  ---per [facility]--- Influenza A RLL Pneumonia AKI Breast CA with concern for bony metastasis on CT  Cirrhosis and portal venous HTN Hypothyroidism   Admit - to [facility] service   I reviewed nursing notes, ED provider notes, last 24 h vitals and pain scores, last 48 h intake and output, last 24 h labs and trends, and last 24 h imaging results.  Henreitta Cea, [facility] 03/14/2022, 4:12 PM Please see Amion for pager number during day hours 7:00am-4:30pm or 7:00am -11:30am on weekends

## 2022-03-14 NOTE — H&P (Signed)
History and Physical    Patient: Catherine Decker JOA:[id number] DOB: [birth year] DOA: 03/14/2022 DOS: the patient was seen and examined on 03/14/2022 PCP: Antony Contras, MD  Patient coming from: Home  Chief Complaint:  Chief Complaint  Patient presents with   Fall   Diarrhea   Weakness   Cough   HPI: Catherine Decker is a 82 y.o. female with medical history significant of hypothyroidism, HTN. She reports that she fell down the stairs 2 days ago. She slipped while walking down. She hit her head on the way down, but did not suffer LOC. She was able to get up with the aide of a friend. She elected to stay home. Her daughter notes that she was getting more confused and fatigued over these last two days. She had poor appetite. She notes that for the last week and a half, the patient has had cough and congestion. She hasn't had any fevers or sick contacts. But those symptoms seemed to have worsen after her fall. When she didn't improve today, her family became concerned and had her to come the ED for evaluation.   Review of Systems: As mentioned in the history of present illness. All other systems reviewed and are negative. Past Medical History:  Diagnosis Date   Anemia    Arthritis of back    Arthritis of hand    per patient right hand   Autoimmune deficiency syndrome ([place])    per patient   Breast cancer ([place])    Cancer ([place]) 08/2019   right breast ILC   Cirrhosis ([place])    Cirrhosis of liver (HCC)    Complication of anesthesia    per patient, slow to awake   Diabetes mellitus    GERD (gastroesophageal reflux disease)    Also, Hiatal Hernia    H/O migraine    Hepatitis    per patient, had infectious hepatitis as a 82 year old   Hyperlipidemia    Hypertension    Hypothyroidism    Splenic artery aneurysm ([place])    Past Surgical History:  Procedure Laterality Date   APPENDECTOMY     BREAST LUMPECTOMY     Right breast lumpectomy   CATARACT EXTRACTION W/ INTRAOCULAR LENS  IMPLANT,  BILATERAL  2018   CHOLECYSTECTOMY  04/2010   MASTECTOMY W/ SENTINEL NODE BIOPSY Right 09/07/2019   Procedure: RIGHT SIMPLE MASTECTOMY WITH SENTINEL LYMPH NODE MAPPING;  Surgeon: Erroll Luna, MD;  Location: [facility];  Service: General;  Laterality: Right;  PEC BLOCK   SCAR REVISION Right 06/21/2020   Procedure: SCAR REVISION OF RIGHT MASTECTOMY;  Surgeon: Erroll Luna, MD;  Location: [facility];  Service: General;  Laterality: Right;   [place]   Social History:  reports that she has never smoked. She has never used smokeless tobacco. She reports that she does not drink alcohol and does not use drugs.  Allergies  Allergen Reactions   Demerol Anaphylaxis   Meperidine Anaphylaxis   Meperidine Hcl Other (See Comments)    Family History  Problem Relation Age of Onset   Diabetes Mother    Heart disease Mother    Hypertension Mother    Breast cancer Mother    Heart disease Father    Heart attack Father    Prostate cancer Father    Hypertension Sister    Varicose Veins Sister    Hyperlipidemia Sister    Colon cancer Sister    Heart disease  Brother    Diabetes Brother    Hypertension Brother    Prostate cancer Brother    Cirrhosis Brother    Hypertension Brother    Hypertension Daughter    Hypertension Son    Liver cancer Neg Hx    Osteoporosis Neg Hx     Prior to Admission medications   Medication Sig Start Date End Date Taking? Authorizing Provider  calcium carbonate (OS-CAL) 600 MG TABS tablet Take 600 mg by mouth daily with breakfast.   Yes [provider]  celecoxib (CELEBREX) 200 MG capsule Take 200 mg by mouth 2 (two) times daily.   Yes [provider]  cholecalciferol (VITAMIN D3) 25 MCG (1000 UNIT) tablet Take 1,000 Units by mouth daily.   Yes [provider]  Coenzyme Q10 (COQ10) 100 MG CAPS Take 1 capsule by mouth daily.   Yes [provider]  fenofibrate 160 MG tablet Take 160 mg by  mouth daily.   Yes [provider]  folic acid (FOLVITE) 1 MG tablet Take 1 tablet (1 mg total) by mouth daily. 12/12/19  Yes Orson Slick, MD  Ginkgo Biloba 60 MG CAPS Take 60 mg by mouth daily.   Yes [provider]  letrozole (FEMARA) 2.5 MG tablet Take 1 tablet (2.5 mg total) by mouth daily. 01/24/22  Yes Orson Slick, MD  levothyroxine (SYNTHROID) 112 MCG tablet Take 112 mcg by mouth daily before breakfast.   Yes [provider]  losartan (COZAAR) 25 MG tablet Take 12.5 mg by mouth daily.  04/25/19  Yes [provider]  Omega-3 Fatty Acids (FISH OIL) 600 MG CAPS Take 600 mg by mouth daily.   Yes [provider]  omeprazole (PRILOSEC) 20 MG capsule Take 20 mg by mouth daily as needed (acid reflux).    Yes [provider]  pioglitazone (ACTOS) 15 MG tablet Take 15 mg by mouth daily. 04/12/19  Yes [provider]  simvastatin (ZOCOR) 20 MG tablet Take 20 mg by mouth every evening.   Yes [provider]  vitamin B-12 (CYANOCOBALAMIN) 1000 MCG tablet Take 1,000 mcg by mouth daily.   Yes [provider]  Glucose Blood (ONETOUCH ULTRA BLUE VI) daily.    [provider]    Physical Exam: Vitals:   03/14/22 1403 03/14/22 1435 03/14/22 1517 03/14/22 1530  BP: (!) 122/47 (!) 139/54 (!) 141/57 (!) 136/55  Pulse: 79 77 76 77  Resp: '19 19 19 18  '$ Temp:      TempSrc:      SpO2: 100% 100% 100% 100%  Weight:      Height:       General: 82 y.o. female resting in bed in NAD Eyes: PERRL, normal sclera ENMT: Nares patent w/o discharge, orophaynx clear, dentition normal, ears w/o discharge/lesions/ulcers Neck: Supple, trachea midline Cardiovascular: RRR, +S1, S2, no m/g/r, equal pulses throughout Respiratory: decreased at bases, no w/r/r, normal WOB GI: BS+, NDNT, no masses noted, no organomegaly noted MSK: No e/c/c Neuro: A&O x 3, no focal deficits Psyc: Appropriate interaction and affect,  calm/cooperative  Data Reviewed:  Results for orders placed or performed during the hospital encounter of 03/14/22 (from the past 24 hour(s))  Resp panel by RT-PCR (RSV, Flu A&B, Covid) Anterior Nasal Swab     Status: Abnormal   Collection Time: 03/14/22 11:12 AM   Specimen: Anterior Nasal Swab  Result Value Ref Range   SARS Coronavirus 2 by RT PCR NEGATIVE NEGATIVE   Influenza  A by PCR POSITIVE (A) NEGATIVE   Influenza B by PCR NEGATIVE NEGATIVE   Resp Syncytial Virus by PCR NEGATIVE NEGATIVE  CBC     Status: Abnormal   Collection Time: 03/14/22  1:01 PM  Result Value Ref Range   WBC 7.1 4.0 - 10.5 K/uL   RBC 2.91 (L) 3.87 - 5.11 MIL/uL   Hemoglobin 9.5 (L) 12.0 - 15.0 g/dL   HCT 29.3 (L) 36.0 - 46.0 %   MCV 100.7 (H) 80.0 - 100.0 fL   MCH 32.6 26.0 - 34.0 pg   MCHC 32.4 30.0 - 36.0 g/dL   RDW 15.4 11.5 - 15.5 %   Platelets 59 (L) 150 - 400 K/uL   nRBC 0.0 0.0 - 0.2 %  TSH     Status: Abnormal   Collection Time: 03/14/22  1:01 PM  Result Value Ref Range   TSH 5.716 (H) 0.350 - 4.500 uIU/mL  Comprehensive metabolic panel     Status: Abnormal   Collection Time: 03/14/22  1:01 PM  Result Value Ref Range   Sodium 136 135 - 145 mmol/L   Potassium 4.3 3.5 - 5.1 mmol/L   Chloride 110 98 - 111 mmol/L   CO2 17 (L) 22 - 32 mmol/L   Glucose, Bld 155 (H) 70 - 99 mg/dL   BUN 35 (H) 8 - 23 mg/dL   Creatinine, Ser 2.24 (H) 0.44 - 1.00 mg/dL   Calcium 8.8 (L) 8.9 - 10.3 mg/dL   Total Protein 6.3 (L) 6.5 - 8.1 g/dL   Albumin 2.8 (L) 3.5 - 5.0 g/dL   AST 88 (H) 15 - 41 U/L   ALT 32 0 - 44 U/L   Alkaline Phosphatase 109 38 - 126 U/L   Total Bilirubin 2.9 (H) 0.3 - 1.2 mg/dL   GFR, Estimated 22 (L) >60 mL/min   Anion gap 9 5 - 15  Ammonia     Status: None   Collection Time: 03/14/22  1:01 PM  Result Value Ref Range   Ammonia 32 9 - 35 umol/L  Lactic acid, plasma     Status: Abnormal   Collection Time: 03/14/22  1:01 PM  Result Value Ref Range   Lactic Acid, Venous 4.0 (HH) 0.5 -  1.9 mmol/L  CBG monitoring, ED     Status: Abnormal   Collection Time: 03/14/22  1:22 PM  Result Value Ref Range   Glucose-Capillary 134 (H) 70 - 99 mg/dL   CTH CT c-spine 1. Bilateral low-attenuation sub dural fluid collections overlie the cerebral image spheres compatible with chronic subdural hygromas. 2. Overlying the right frontoparietal convexity there is a 4 mm intermediate density subpleural fluid collection which measures Hounsfield units between 50 and 60. This is favored to represent a subacute subdural hematoma. 3. Acute mildly displaced fracture involving the posterior spinous process of T1. 4. Lucent lesion is identified within the posterior aspect of the T1 vertebral body measuring 1.4 cm. Permeative appearance of the right coracoid process of the right scapula. Findings are concerning for metastatic disease. 5. Chronic small vessel ischemic disease and brain atrophy.  CXR: 1. Right seventh and eighth rib fractures. No pneumothorax. 2. New medial right basilar airspace opacity suspicious for pneumonia. Followup PA and lateral chest radiographs are recommended in 3-4 weeks following trial of antibiotic therapy to ensure resolution and exclude underlying malignancy. 3. Possible trace pleural effusions. 4. Age-indeterminate midthoracic vertebral compression fracture.  Assessment and Plan: Falls Multiple rib fractures Spinous process fracture of T1 Subdural hematoma  Chronic hygromas     - admit to inpt, progressive     - trauma surgery onboard, recs noted, appreciate assistance     - neurosurgery has reviewed case; they do not believe the SDH are acute or need intervention; no intervention for the spinous process fracture     - PT/OT     - pain control     - pulm toileting   RLL PNA Influenza A     - outside the window for tamiflu     - continue CAP coverage     - pulm toilet     - guaifenesin  AKI on CKD3b     - fluids     - check renal US     - watch  nephrotoxins  Lactic acidosis     - fluids     - follow UCx, Bld Cx  Macrocytic anemia Thrombocytopenia     - no evidence of bleed     - SCDs     - follows w/ Dr. Lorenso Courier on this; not far off baseline  Hypothyroidism     - continue home regimen  HTN     - hold home ARB d/t AKI  HLD     - hold regimen d/t elevated LFTs  Hx of Breast CA s/p mastectomy Spinal lesion     - follows w/ Dr. Lorenso Courier     - has T1 and right scapula lesions concerning for mets     - I have notified Dr. Lorenso Courier through secure chat, awaiting response     - she has more acute needs right now, but will need full CT scan  DM2     - A1c, glucose checks, DM diet, SSI  Elevated LFTs     - check hepatitis panel     - check RUQ ab [place]     - hold home HLD Meds  Advance Care Planning:   Code Status: DNR, confirmed through multiple lines of questioning  Consults: Trauma surgery; Neurosurgery; Onco  Family Communication: w/ daughter at bedside  Severity of Illness: The appropriate patient status for this patient is INPATIENT. Inpatient status is judged to be reasonable and necessary in order to provide the required intensity of service to ensure the patient's safety. The patient's presenting symptoms, physical exam findings, and initial radiographic and laboratory data in the context of their chronic comorbidities is felt to place them at high risk for further clinical deterioration. Furthermore, it is not anticipated that the patient will be medically stable for discharge from the hospital within 2 midnights of admission.   * I certify that at the point of admission it is my clinical judgment that the patient will require inpatient hospital care spanning beyond 2 midnights from the point of admission due to high intensity of service, high risk for further deterioration and high frequency of surveillance required.*  Author: Jonnie Finner, DO 03/14/2022 3:40 PM  For on call review [email or web address].

## 2022-03-14 NOTE — ED Provider Notes (Signed)
Patient's workup began to return.  Patient does indeed have influenza, bilateral pneumonia, and multiple rib fractures, possible acute subdural in the setting of chronic hygroma/subdural's, T1 spinous process fracture, and T7 compression fracture.  She also has some lytic lesions concerning for bony metastasis likely of her breast cancer.  Patient was informed of all these findings.  She also has evidence of a lactic acid of 4, AKI, and thrombocytopenia.  Spoke with Dr. Bobbye Morton with trauma who does feel she needs admission to medicine for further management but agrees that he does not need transfer to [facility] or trauma involvement at this time.  She agreed with touching base with neurosurgery to make sure there are no other recommendations but as the CT reads appear they could be more chronic than acute and patient does not have any head or neck pain, have low suspicion there of any neurosurgical intervention at this time.  Patient will get antibiotics for the pneumonia and will call for admission for further management.  Spoke to neurosurgery with Dr. [name] who did not feel she has acute subdurals.  He feels she can follow-up and does not need any acute bracing for the spine injuries either.  Patient admitted to medicine for further management of pneumonia with influenza and new rib fractures.   Clinical Impression: 1. COVID   2. Closed fracture of multiple ribs of both sides, initial encounter   3. SDH (subdural hematoma) (HCC)     Disposition: Admit  This note was prepared with assistance of Dragon voice recognition software. Occasional wrong-word or sound-a-like substitutions may have occurred due to the inherent limitations of voice recognition software.    CRITICAL CARE Performed by: Gwenyth Allegra Breeze Berringer Total critical care time: 35 minutes Critical care time was exclusive of separately billable procedures and treating other patients. Critical care was necessary to treat or  prevent imminent or life-threatening deterioration. Critical care was time spent personally by me on the following activities: development of treatment plan with patient and/or surrogate as well as nursing, discussions with consultants, evaluation of patient's response to treatment, examination of patient, obtaining history from patient or surrogate, ordering and performing treatments and interventions, ordering and review of laboratory studies, ordering and review of radiographic studies, pulse oximetry and re-evaluation of patient's condition.    Peachie Barkalow, Gwenyth Allegra, MD 03/14/22 [id number]

## 2022-03-14 NOTE — ED Provider Notes (Signed)
[facility] DEPT Provider Note   CSN: [id number] Arrival date & time: 03/14/22  1011     History  Chief Complaint  Patient presents with   Fall   Diarrhea   Weakness   Cough    Catherine Decker is a 82 y.o. female.  82 year old PMH of diabetes, hypothyroidism, breast cancer on letrozole . Patient accompanied with her daughter.  Patient says for the last 2 weeks has been having cold-like symptoms with cough and nasal drainage.  She denies any fevers that she knows of.  No known sick contacts.  Denies any shortness of breath or chest pain.  Daughter says for the last 2 days has been more confused and much less energy than normal and sleeping more than her normal.  Patient says that she has had decreased appetite and has not been eating as much although she says that she has been drinking as much but that her mouth is very dry.  She says that she has been having diarrhea for the last 2 weeks.  She is also says that she is not very hungry and has not eaten very much in the last few days.  She did have an episode of emesis today but she says this was after a bout of coughing and it was mucousy, NBNB.  She does have diabetes and takes pioglitazone at home, her blood sugar yesterday was in the 140s but she has not checked her blood pressure sugar today.  Yesterday she also had a a fall after going to the bathroom she says she was disoriented and she had grabbed onto the railings on both sides of the staircase but then had lost her footing and fell down 4 stairs.  She believes it was on her back and she had slid down. Not on any blood thinners. She lives with daughter's FIL and he relayed that she did not lose consciousness but he is unsure if she hit her head or not. She is complaining of bilateral side pain and has bruising present on her back and fingers.  Of note she has also been out of her thyroid medication for the past 2 weeks.  The history is provided by the patient  and a relative.  Fall Pertinent negatives include no chest pain, no abdominal pain and no shortness of breath.  Diarrhea Associated symptoms: myalgias   Associated symptoms: no abdominal pain, no fever and no vomiting   Weakness Associated symptoms: cough, diarrhea and myalgias   Associated symptoms: no abdominal pain, no chest pain, no dizziness, no dysuria, no fever, no nausea, no shortness of breath, no urgency and no vomiting   Cough Associated symptoms: myalgias   Associated symptoms: no chest pain, no fever, no shortness of breath and no sore throat        Home Medications Prior to Admission medications   Medication Sig Start Date End Date Taking? Authorizing Provider  calcium carbonate (OS-CAL) 600 MG TABS tablet Take 600 mg by mouth daily with breakfast.    [provider]  celecoxib (CELEBREX) 200 MG capsule Take 200 mg by mouth 2 (two) times daily.    [provider]  cholecalciferol (VITAMIN D3) 25 MCG (1000 UNIT) tablet Take 1,000 Units by mouth daily.    [provider]  Coenzyme Q10 (COQ10) 100 MG CAPS     [provider]  fenofibrate 160 MG tablet Take 160 mg by mouth daily.    [provider]  folic acid (FOLVITE)  1 MG tablet Take 1 tablet (1 mg total) by mouth daily. 12/12/19   Orson Slick, MD  Ginkgo Biloba 60 MG CAPS Take 60 mg by mouth daily.    [provider]  Glucose Blood (ONETOUCH ULTRA BLUE VI) daily.    [provider]  letrozole (FEMARA) 2.5 MG tablet Take 1 tablet (2.5 mg total) by mouth daily. 01/24/22   Orson Slick, MD  levothyroxine (SYNTHROID) 112 MCG tablet Take 112 mcg by mouth daily before breakfast.    [provider]  losartan (COZAAR) 25 MG tablet Take 12.5 mg by mouth daily.  04/25/19   [provider]  Omega-3 Fatty Acids (FISH OIL) 600 MG CAPS Take 600 mg by mouth daily.    [provider]  omeprazole (PRILOSEC) 20 MG capsule Take 20 mg by mouth  daily as needed (acid reflux).     [provider]  pioglitazone (ACTOS) 15 MG tablet Take 15 mg by mouth daily. 04/12/19   [provider]  simvastatin (ZOCOR) 20 MG tablet Take 20 mg by mouth every evening.    [provider]  vitamin B-12 (CYANOCOBALAMIN) 1000 MCG tablet Take 1,000 mcg by mouth daily.    [provider]      Allergies    Demerol, Meperidine, and Meperidine hcl    Review of Systems   Review of Systems  Constitutional:  Positive for appetite change and fatigue. Negative for fever.  HENT:  Negative for sore throat.   Eyes:  Negative for redness.  Respiratory:  Positive for cough. Negative for shortness of breath.   Cardiovascular:  Negative for chest pain and leg swelling.  Gastrointestinal:  Positive for diarrhea. Negative for abdominal pain, constipation, nausea and vomiting.  Genitourinary:  Negative for difficulty urinating, dysuria, flank pain, hematuria and urgency.  Musculoskeletal:  Positive for myalgias.       Pain around rib cages  Neurological:  Positive for weakness. Negative for dizziness, syncope and light-headedness.    Physical Exam Updated Vital Signs BP (!) 138/47 (BP Location: Left Arm)   Pulse 91   Temp 98.8 F (37.1 C) (Oral)   Resp 18   Ht '5\' 3"'$  (1.6 m)   Wt 73.5 kg   SpO2 100%   BMI 28.70 kg/m  Physical Exam Constitutional:      General: She is not in acute distress.    Appearance: She is ill-appearing. She is not toxic-appearing.  HENT:     Head: Normocephalic.     Comments: Bruising present on back of head on right    Nose: Nose normal.     Mouth/Throat:     Mouth: Mucous membranes are dry.     Pharynx: No oropharyngeal exudate.  Eyes:     Conjunctiva/sclera: Conjunctivae normal.  Cardiovascular:     Rate and Rhythm: Normal rate and regular rhythm.     Pulses: Normal pulses.     Heart sounds: Normal heart sounds. No murmur heard. Pulmonary:     Effort: Pulmonary effort is normal.      Breath sounds: Normal breath sounds.  Abdominal:     General: Abdomen is flat. There is no distension.     Palpations: Abdomen is soft.     Tenderness: There is no abdominal tenderness. There is no right CVA tenderness or left CVA tenderness.  Musculoskeletal:     Cervical back: Neck supple.  Skin:    Capillary Refill: Capillary refill takes less than 2 seconds.  Findings: Bruising present.     Comments: Bruising on back and shoulder blades and back of head  Neurological:     General: No focal deficit present.     Mental Status: She is alert and oriented to person, place, and time.  Psychiatric:        Mood and Affect: Mood normal.       ED Results / Procedures / Treatments   Labs (all labs ordered are listed, but only abnormal results are displayed) Labs Reviewed  RESP PANEL BY RT-PCR (RSV, FLU A&B, COVID)  [id number]  URINE CULTURE  CBC  URINALYSIS, ROUTINE W REFLEX MICROSCOPIC  TSH  T4, FREE  COMPREHENSIVE METABOLIC PANEL  AMMONIA  LACTIC ACID, PLASMA  LACTIC ACID, PLASMA  CBG MONITORING, ED    EKG EKG Interpretation  Date/Time:  Friday March 14 2022 11:34:19 EST Ventricular Rate:  92 PR Interval:  153 QRS Duration: 105 QT Interval:  358 QTC Calculation: 443 R Axis:   61 Text Interpretation: Sinus rhythm when compared to prior, more artifact. No STEMI Confirmed by Antony Blackbird [phone]) on 03/14/2022 11:59:16 AM  Radiology DG Chest 2 View  Result Date: 03/14/2022 CLINICAL DATA:  Fall, rib pain, cough, and shortness of breath. EXAM: CHEST - 2 VIEW COMPARISON:  Chest radiographs 08/07/2009 and CT 02/28/2020 FINDINGS: The cardiomediastinal silhouette is unchanged with normal heart size. The lungs are hyperinflated. New patchy opacity is present in the medial right lung base. The left lung is clear. There could be trace pleural effusions. No pneumothorax is identified. There is a mildly displaced posterolateral right eighth rib fracture, and a non or minimally  displaced seventh rib fracture is also suspected. Surgical clips are noted in the right axilla. There is a midthoracic vertebral compression fracture with moderate vertebral body height loss which is new from 2021 and of indeterminate age. IMPRESSION: 1. Right seventh and eighth rib fractures. No pneumothorax. 2. New medial right basilar airspace opacity suspicious for pneumonia. Followup PA and lateral chest radiographs are recommended in 3-4 weeks following trial of antibiotic therapy to ensure resolution and exclude underlying malignancy. 3. Possible trace pleural effusions. 4. Age-indeterminate midthoracic vertebral compression fracture. Electronically Signed   By: Logan Bores M.D.   On: 03/14/2022 12:02    Procedures Procedures   Medications Ordered in ED Medications - No data to display  ED Course/ Medical Decision Making/ A&P                           Medical Decision Making 82 year old female here for fall yesterday, cold symptoms and diarrhea for 2 weeks, out of thyroid medication for the past 2 weeks weakness and rib pain.  Obtain chest x-ray which shows pneumonia as well as rib fractures.  Will obtain CT imaging for polytrauma, being CT head, CT chest abdomen pelvis as long as kidney function okay, and CT cervical spine.  Will obtain baseline labs of CBC, CMP, UA and ammonia.  Will also obtain thyroid function as patient has been out of this for 2 weeks.  Will obtain viral testing as well. Discussed and handed off to Dr. Sherry Ruffing.  Amount and/or Complexity of Data Reviewed Labs: ordered.   Final Clinical Impression(s) / ED Diagnoses Final diagnoses:  None    Rx / DC Orders ED Discharge Orders     None         Gerrit Heck, MD 03/14/22 1259    Tegeler, Gwenyth Allegra, MD 03/26/22 1041

## 2022-03-15 ENCOUNTER — Inpatient Hospital Stay (HOSPITAL_COMMUNITY): Payer: Medicare HMO

## 2022-03-15 DIAGNOSIS — K7460 Unspecified cirrhosis of liver: Secondary | ICD-10-CM

## 2022-03-15 DIAGNOSIS — I10 Essential (primary) hypertension: Secondary | ICD-10-CM

## 2022-03-15 DIAGNOSIS — N1832 Chronic kidney disease, stage 3b: Secondary | ICD-10-CM

## 2022-03-15 DIAGNOSIS — E872 Acidosis, unspecified: Secondary | ICD-10-CM

## 2022-03-15 DIAGNOSIS — J101 Influenza due to other identified influenza virus with other respiratory manifestations: Secondary | ICD-10-CM

## 2022-03-15 DIAGNOSIS — E119 Type 2 diabetes mellitus without complications: Secondary | ICD-10-CM

## 2022-03-15 DIAGNOSIS — N179 Acute kidney failure, unspecified: Secondary | ICD-10-CM

## 2022-03-15 DIAGNOSIS — W19XXXD Unspecified fall, subsequent encounter: Secondary | ICD-10-CM | POA: Diagnosis not present

## 2022-03-15 DIAGNOSIS — L8990 Pressure ulcer of unspecified site, unspecified stage: Secondary | ICD-10-CM | POA: Insufficient documentation

## 2022-03-15 DIAGNOSIS — S065XAA Traumatic subdural hemorrhage with loss of consciousness status unknown, initial encounter: Secondary | ICD-10-CM

## 2022-03-15 DIAGNOSIS — D181 Lymphangioma, any site: Secondary | ICD-10-CM

## 2022-03-15 DIAGNOSIS — S2243XA Multiple fractures of ribs, bilateral, initial encounter for closed fracture: Secondary | ICD-10-CM | POA: Diagnosis not present

## 2022-03-15 DIAGNOSIS — D61818 Other pancytopenia: Secondary | ICD-10-CM

## 2022-03-15 DIAGNOSIS — E039 Hypothyroidism, unspecified: Secondary | ICD-10-CM

## 2022-03-15 LAB — URINALYSIS, ROUTINE W REFLEX MICROSCOPIC
Bilirubin Urine: NEGATIVE
Glucose, UA: NEGATIVE mg/dL
Hgb urine dipstick: NEGATIVE
Ketones, ur: NEGATIVE mg/dL
Nitrite: NEGATIVE
Protein, ur: NEGATIVE mg/dL
Specific Gravity, Urine: 1.018 (ref 1.005–1.030)
pH: 5 (ref 5.0–8.0)

## 2022-03-15 LAB — COMPREHENSIVE METABOLIC PANEL
ALT: 25 U/L (ref 0–44)
AST: 59 U/L — ABNORMAL HIGH (ref 15–41)
Albumin: 1.9 g/dL — ABNORMAL LOW (ref 3.5–5.0)
Alkaline Phosphatase: 79 U/L (ref 38–126)
Anion gap: 8 (ref 5–15)
BUN: 43 mg/dL — ABNORMAL HIGH (ref 8–23)
CO2: 18 mmol/L — ABNORMAL LOW (ref 22–32)
Calcium: 8.1 mg/dL — ABNORMAL LOW (ref 8.9–10.3)
Chloride: 112 mmol/L — ABNORMAL HIGH (ref 98–111)
Creatinine, Ser: 2.51 mg/dL — ABNORMAL HIGH (ref 0.44–1.00)
GFR, Estimated: 19 mL/min — ABNORMAL LOW (ref >60)
Glucose, Bld: 95 mg/dL (ref 70–99)
Potassium: 3.9 mmol/L (ref 3.5–5.1)
Sodium: 138 mmol/L (ref 135–145)
Total Bilirubin: 2 mg/dL — ABNORMAL HIGH (ref 0.3–1.2)
Total Protein: 4.9 g/dL — ABNORMAL LOW (ref 6.5–8.1)

## 2022-03-15 LAB — SODIUM, URINE, RANDOM: Sodium, Ur: 13 mmol/L

## 2022-03-15 LAB — CBC
HCT: 22.5 % — ABNORMAL LOW (ref 36.0–46.0)
Hemoglobin: 7.5 g/dL — ABNORMAL LOW (ref 12.0–15.0)
MCH: 33.3 pg (ref 26.0–34.0)
MCHC: 33.3 g/dL (ref 30.0–36.0)
MCV: 100 fL (ref 80.0–100.0)
Platelets: 40 10*3/uL — ABNORMAL LOW (ref 150–400)
RBC: 2.25 MIL/uL — ABNORMAL LOW (ref 3.87–5.11)
RDW: 15.7 % — ABNORMAL HIGH (ref 11.5–15.5)
WBC: 2.6 10*3/uL — ABNORMAL LOW (ref 4.0–10.5)
nRBC: 0 % (ref 0.0–0.2)

## 2022-03-15 LAB — GLUCOSE, CAPILLARY
Glucose-Capillary: 96 mg/dL (ref 70–99)
Glucose-Capillary: 117 mg/dL — ABNORMAL HIGH (ref 70–99)
Glucose-Capillary: 124 mg/dL — ABNORMAL HIGH (ref 70–99)
Glucose-Capillary: 135 mg/dL — ABNORMAL HIGH (ref 70–99)

## 2022-03-15 LAB — STREP PNEUMONIAE URINARY ANTIGEN: Strep Pneumo Urinary Antigen: POSITIVE — AB

## 2022-03-15 LAB — HEPATITIS PANEL, ACUTE
HCV Ab: NONREACTIVE
Hep A IgM: NONREACTIVE
Hep B C IgM: NONREACTIVE
Hepatitis B Surface Ag: NONREACTIVE

## 2022-03-15 LAB — CREATININE, URINE, RANDOM: Creatinine, Urine: 195 mg/dL

## 2022-03-15 MED ORDER — LORATADINE 10 MG PO TABS
10.0000 mg | ORAL_TABLET | Freq: Every day | ORAL | Status: DC
  Administered 2022-03-15 – 2022-04-03 (×20): 10 mg via ORAL
  Filled 2022-03-15 (×20): qty 1

## 2022-03-15 MED ORDER — IPRATROPIUM-ALBUTEROL 0.5-2.5 (3) MG/3ML IN SOLN
3.0000 mL | Freq: Two times a day (BID) | RESPIRATORY_TRACT | Status: DC
  Administered 2022-03-15 (×2): 3 mL via RESPIRATORY_TRACT
  Filled 2022-03-15 (×2): qty 3

## 2022-03-15 MED ORDER — FENOFIBRATE 160 MG PO TABS: 160.0000 mg | ORAL_TABLET | Freq: Every day | ORAL | Status: DC

## 2022-03-15 MED ORDER — HYDROCODONE BIT-HOMATROP MBR 5-1.5 MG/5ML PO SOLN
5.0000 mL | Freq: Four times a day (QID) | ORAL | Status: DC | PRN
  Administered 2022-03-16 – 2022-03-19 (×7): 5 mL via ORAL
  Filled 2022-03-15 (×7): qty 473

## 2022-03-15 MED ORDER — FISH OIL 600 MG PO CAPS: 600.0000 mg | ORAL_CAPSULE | Freq: Every day | ORAL | Status: DC

## 2022-03-15 MED ORDER — LETROZOLE 2.5 MG PO TABS
2.5000 mg | ORAL_TABLET | Freq: Every day | ORAL | Status: DC
  Administered 2022-03-15 – 2022-03-25 (×11): 2.5 mg via ORAL
  Filled 2022-03-15 (×11): qty 1

## 2022-03-15 MED ORDER — PROCHLORPERAZINE EDISYLATE 10 MG/2ML IJ SOLN
5.0000 mg | Freq: Four times a day (QID) | INTRAMUSCULAR | Status: DC | PRN
  Administered 2022-03-15: 5 mg via INTRAVENOUS
  Filled 2022-03-15: qty 2

## 2022-03-15 MED ORDER — FLUTICASONE PROPIONATE 50 MCG/ACT NA SUSP
2.0000 | Freq: Every day | NASAL | Status: DC
  Administered 2022-03-15 – 2022-04-03 (×20): 2 via NASAL
  Filled 2022-03-15: qty 16

## 2022-03-15 MED ORDER — OSELTAMIVIR PHOSPHATE 30 MG PO CAPS
30.0000 mg | ORAL_CAPSULE | Freq: Every day | ORAL | Status: AC
  Administered 2022-03-15 – 2022-03-19 (×5): 30 mg via ORAL
  Filled 2022-03-15 (×6): qty 1

## 2022-03-15 NOTE — Evaluation (Signed)
Occupational Therapy Evaluation Patient Details Name: Catherine Decker MRN: [id number] DOB: December 24, 1940 Today's Date: 03/15/2022   History of Present Illness Patient is a 82 year old female who presented after a fall down some stairs. Patient was found to have left rib fractures 4-5, right rib fractures 7-8, T7 compression fx, T1 spinous process fx, SDH. PHX:TAVWP breast cancer, arthritis, hyperlipidemia, splenic artery aneurysm,   Clinical Impression   Patient is a 82 year old female who was admitted for above. Patient was living at home alone prior level. Currently, patient is mod A for transfer to 3 in 1 commode at bedside and TD for hygiene tasks. Patient was noted to have decreased functional activity tolerance, increased pain, decreased knowledge of AE/AD impacting participation in ADLs. Patient would continue to benefit from skilled OT services at this time while admitted and after d/c to address noted deficits in order to improve overall safety and independence in ADLs.       Recommendations for follow up therapy are one component of a multi-disciplinary discharge planning process, led by the attending physician.  Recommendations may be updated based on patient status, additional functional criteria and insurance authorization.   Follow Up Recommendations  Skilled nursing-short term rehab (<3 hours/day)     Assistance Recommended at Discharge Frequent or constant Supervision/Assistance  Patient can return home with the following Two people to help with walking and/or transfers;A lot of help with bathing/dressing/bathroom;Assistance with cooking/housework;Direct supervision/assist for medications management;Assist for transportation;Help with stairs or ramp for entrance;Direct supervision/assist for financial management    Functional Status Assessment     Equipment Recommendations  Other (comment) (defer to next venue)    Recommendations for Other Services       Precautions /  Restrictions Precautions Precautions: Fall Precaution Comments: rib fractures, watch RR Restrictions Weight Bearing Restrictions: No      Mobility Bed Mobility Overal bed mobility: Needs Assistance Bed Mobility: Supine to Sit, Sit to Supine     Supine to sit: Min assist Sit to supine: Min assist   General bed mobility comments: with increased time and cues to maintain precautions.       Balance Overall balance assessment: Mild deficits observed, not formally tested           ADL either performed or assessed with clinical judgement   ADL Overall ADL's : Needs assistance/impaired Eating/Feeding: Set up;Sitting   Grooming: Set up;Sitting Grooming Details (indicate cue type and reason): declined today with nausea noted Upper Body Bathing: Minimal assistance;Sitting   Lower Body Bathing: Total assistance;Sitting/lateral leans   Upper Body Dressing : Sitting;Minimal assistance   Lower Body Dressing: Total assistance;Sitting/lateral leans   Toilet Transfer: Minimal assistance;Stand-pivot;BSC/3in1 Toilet Transfer Details (indicate cue type and reason): with increased time and physical assist to transition fully onto chair with patient attempting to sit mid turn. patient quick to fatigue with tasks. Toileting- Clothing Manipulation and Hygiene: Maximal assistance;Sit to/from stand Toileting - Clothing Manipulation Details (indicate cue type and reason): with increased time and noted redness on bottom. quarter size loose stool, nurse made aware.             Vision Baseline Vision/History: 1 Wears glasses              Pertinent Vitals/Pain Pain Assessment Pain Assessment: Faces Faces Pain Scale: Hurts even more Pain Location: ribs with movement Pain Descriptors / Indicators: Discomfort, Grimacing Pain Intervention(s): Limited activity within patient's tolerance, Monitored during session, Premedicated before session     Hand Dominance  Right   Extremity/Trunk  Assessment Upper Extremity Assessment Upper Extremity Assessment: Generalized weakness (difficult to formally assess with pain from rib fractures.)   Lower Extremity Assessment Lower Extremity Assessment: Defer to PT evaluation   Cervical / Trunk Assessment Cervical / Trunk Assessment: Other exceptions Cervical / Trunk Exceptions: noted to have large brusing around scapular area on back, and lower on R side   Communication Communication Communication: No difficulties;Other (comment) (horse voice)   Cognition Arousal/Alertness: Awake/alert Behavior During Therapy: WFL for tasks assessed/performed Overall Cognitive Status: Within Functional Limits for tasks assessed         General Comments: plesant, not able to speak much with horse voice, daughter was present in room.                Home Living Family/patient expects to be discharged to:: Private residence Living Arrangements: Children Available Help at Discharge: Family Type of Home: House Home Access: Stairs to enter;Ramped entrance (did not give a number of how many)     Home Layout: One level               Home Equipment: None          Prior Functioning/Environment Prior Level of Function : Independent/Modified Independent               ADLs Comments: independent in everything        OT Problem List: Decreased strength;Decreased activity tolerance;Impaired balance (sitting and/or standing);Decreased safety awareness;Decreased knowledge of precautions;Decreased knowledge of use of DME or AE;Pain      OT Treatment/Interventions: Energy conservation;Self-care/ADL training;Therapeutic exercise;DME and/or AE instruction;Patient/family education;Therapeutic activities;Balance training    OT Goals(Current goals can be found in the care plan section) Acute Rehab OT Goals Patient Stated Goal: to get better OT Goal Formulation: With patient Time For Goal Achievement: 03/29/22 Potential to Achieve  Goals: Fair  OT Frequency: Min 2X/week       AM-PAC OT "6 Clicks" Daily Activity     Outcome Measure Help from another person eating meals?: A Little Help from another person taking care of personal grooming?: A Little Help from another person toileting, which includes using toliet, bedpan, or urinal?: A Lot Help from another person bathing (including washing, rinsing, drying)?: A Lot Help from another person to put on and taking off regular upper body clothing?: A Lot Help from another person to put on and taking off regular lower body clothing?: A Lot 6 Click Score: 14   End of Session Equipment Utilized During Treatment: Other (comment) (3 in 1 comode) Nurse Communication: Mobility status  Activity Tolerance: Patient limited by pain;Patient limited by fatigue;Patient limited by lethargy Patient left: in bed;with call bell/phone within reach;with family/visitor present;with bed alarm set  OT Visit Diagnosis: Unsteadiness on feet (R26.81);Other abnormalities of gait and mobility (R26.89);Muscle weakness (generalized) (M62.81)                Time: 6789-3810 OT Time Calculation (min): 19 min Charges:  OT General Charges $OT Visit: 1 Visit OT Evaluation $OT Eval Moderate Complexity: 1 Mod  Thalya Fouche OTR/L, MS Acute Rehabilitation Department Office# [phone]   Willa Rough 03/15/2022, 4:25 PM

## 2022-03-15 NOTE — Plan of Care (Signed)
  Problem: Activity: Goal: Risk for activity intolerance will decrease Outcome: Progressing   Problem: Elimination: Goal: Will not experience complications related to bowel motility Outcome: Progressing Goal: Will not experience complications related to urinary retention Outcome: Progressing   Problem: Pain Managment: Goal: General experience of comfort will improve Outcome: Progressing   

## 2022-03-15 NOTE — Progress Notes (Signed)
PT Cancellation Note  Patient Details Name: Catherine Decker MRN: [id number] DOB: 03-25-1940   Cancelled Treatment:    Reason Eval/Treat Not Completed: Other (comment); pt told OT that she was fatigued and would like PT to see her tomorrow. Will continue efforts to complete PT eval as schedule allows.   [facility] 03/15/2022, 5:32 PM

## 2022-03-15 NOTE — Progress Notes (Addendum)
PROGRESS NOTE    Catherine Decker  JJO:[id number] DOB: 02-25-1941 DOA: 03/14/2022 PCP: Antony Contras, MD    Chief Complaint  Patient presents with   Fall   Diarrhea   Weakness   Cough    Brief Narrative:  Patient 82 year old female history of hypothyroidism, hypertension presented to the ED after she fell down the stairs 2 days prior to admission slipped while walking hit her head on the way down but did not suffer any LOC.  Patient was able to get up with help from a friend however decided to stay home.  Patient's daughter noted that patient with more confusion and fatigue over the past 2 days prior to admission with decreased appetite and a 1-1/2-week history of cough and congestion and subsequently brought to the ED as patient with no clinical improvement.  Patient seen in the ED, workup done concerning for subdural hematoma, chronic hygromas, multiple rib fractures, pneumonia, influenza A, AKI on CKD stage IIIb.  Neurosurgery reviewed films and felt no further workup needed intervention at this time.  Trauma surgery also assessed the patient and recommended pain management, pulmonary toileting.  Patient admitted placed on empiric IV antibiotics and Tamiflu.   Assessment & Plan:   Principal Problem:   Falls Active Problems:   Malignant neoplasm of upper-outer quadrant of right breast in female, estrogen receptor positive ([place])   DM2 (diabetes mellitus, type 2) ([place])   Multiple rib fractures   Closed fracture of spinous process of thoracic vertebra (HCC)   Hygroma   Subdural hematoma (HCC)   CAP (community acquired pneumonia)   Influenza A   AKI (acute kidney injury) ([place])   Stage 3b chronic kidney disease (CKD) (HCC)   HTN (hypertension)   HLD (hyperlipidemia)   Hypothyroidism   Lactic acidosis   Macrocytic anemia   Thrombocytopenia (HCC)   Pancytopenia (HCC)   SDH (subdural hematoma) (HCC)   Pressure injury of skin  #1 fall/multiple rib fractures/spinous process  fracture of T1/subdural hematoma/chronic hygromas -Secondary to mechanical fall. -Patient assessed by the trauma team who reviewed imaging and recommended pain management, pulmonary toileting, incentive spirometry use secondary to rib fractures to prevent worsening pneumonia and no further intervention at this time. -Continue incentive spirometry, flutter valve, treatment of pneumonia, pain management. -Continue Robaxin, scheduled Tylenol. -Films reviewed by neurosurgery who feel no intervention needed at this time. -PT/OT.  2.  Influenza A -Continue Tamiflu.   3.  Right-sided pneumonia -Noted on CT chest. -Patient with fall with rib fractures, likely can take deep inspiratory effort. -Urine strep pneumococcus antigen positive. -Urine Legionella antigen pending. -Continue IV Rocephin, IV azithromycin. -Will discontinue IV azithromycin tomorrow. -Continue scheduled DuoNebs, Flonase, Claritin, PPI, Mucinex.  4.  Hypothyroidism -Continue Synthroid.  5.  AKI on CKD stage IIIb -Likely secondary to volume depletion, dehydration in the setting of ARB. -Renal ultrasound negative for hydronephrosis. -Urinalysis with concerns for UTI. -Urine sodium of 13, urine creatinine 195. -Creatinine slightly bumped to 2.56 from 2.51 from 2.24 on admission. -Continue IV fluids at 125 cc an hour.   -Will give a normal saline bolus.   -Continue to hold ARB, avoid nephrotoxins.   -Monitor urine output.   -Repeat labs in the AM.  6.  UTI -Urinalysis concerning for UTI. -Urine cultures negative.  -Patient empirically on IV Rocephin secondary to right-sided pneumonia.  7.  Hyperlipidemia -Continue to hold home regimen.  8.  History of breast cancer status postmastectomy/spinal lesions -Imaging with T1 a right scapular lesion concerning for  mets. -Patient's primary oncologist, Dr. Lorenso Courier has been notified via epic of patient's admission. -Home regimen letrozole resumed. -Will likely need further  outpatient follow-up with oncology.  9.  Transaminitis -Likely secondary to history of cirrhosis. -Right upper quadrant ultrasound consistent with cirrhosis with portal hypertension, low-density structure in the left lobe of the liver highlighted on prior noncontrast CTs up oropharynx. -LFTs trending down from admission and close to baseline. -Outpatient follow-up.  10.  History of cirrhosis -Outpatient follow-up with primary hepatologist.  11.  Pancytopenia -Patient with a pancytopenia being followed by hematology/oncology in the outpatient setting. -Worsening thrombocytopenia and now patient with a leukopenia unchanged from prior leukopenia likely due to acute infection. -Thrombocytopenia and leukopenia improving. -Patient with no bleeding. -Hematology/oncology informed of admission via epic. -Follow.  12.  Lactic acidosis -Likely secondary to acute infection of pneumonia and influenza A. -Lactic acid levels trending down. -Continue IV fluids, IV antibiotics. -Repeat labs in the AM.  13.  Diabetes mellitus type 2 -Hemoglobin A1c 5.0 (11/05/2020). -CBG 94 this morning. -Hemoglobin A1c pending.   -Continue to hold oral hypoglycemic agents.   14.  Hypertension -BP stable. -Continue to hold antihypertensive medications. -Continue IV fluids.  15.  History of breast cancer -Continue home regimen letrozole. -Oncology notified via epic of admission.  16.  Pressure injury, stage I medial buttocks, POA Pressure Injury 03/14/22 Buttocks Medial;Mid Stage 1 -  Intact skin with non-blanchable redness of a localized area usually over a bony prominence. (Active)  03/14/22 2200  Location: Buttocks  Location Orientation: Medial;Mid  Staging: Stage 1 -  Intact skin with non-blanchable redness of a localized area usually over a bony prominence.  Wound Description (Comments):   Present on Admission: Yes       DVT prophylaxis: SCDs Code Status: DNR Family Communication: Updated  patient.  No family at bedside. Disposition: TBD.  Status is: Inpatient Remains inpatient appropriate because: Severity of illness.   Consultants:  Oncology notified of admission via epic. General surgery/trauma team: Dr.Lovick 03/14/2022  Procedures:  CT head CT C-spine 03/14/2022 Chest x-ray 03/14/2022 Renal ultrasound 03/14/2022 Right upper quadrant ultrasound 03/15/2022 CT chest abdomen and pelvis 03/14/2022  Antimicrobials:  IV azithromycin 03/14/2022>>>>> IV Rocephin 03/14/2022>>>>>>   Subjective: Lying in bed.  Patient states he feels about the same.  Denies any significant change or shortness of breath or chest pain.  No abdominal pain.  States has good urine output.  Having BMs.  Tolerating current diet.  Pain currently controlled per patient on current regimen.   Objective: Vitals:   03/15/22 0615 03/15/22 0822 03/15/22 1046 03/15/22 1249  BP: (!) 112/58 (!) 125/49  122/61  Pulse: 76 72  80  Resp: '18 18  16  '$ Temp: 98.4 F (36.9 C) 97.6 F (36.4 C)  (!) 97.4 F (36.3 C)  TempSrc: Oral Oral  Oral  SpO2: 98% 98% 97% 100%  Weight:      Height:        Intake/Output Summary (Last 24 hours) at 03/15/2022 1745 Last data filed at 03/15/2022 1628 Gross per 24 hour  Intake 2171.57 ml  Output 1 ml  Net 2170.57 ml   Filed Weights   03/14/22 1038 03/14/22 2314  Weight: 73.5 kg 67.7 kg    Examination:  General exam: Appears calm and comfortable  Respiratory system: Coarse breath sounds on the right.  No wheezing.  No crackles.  Fair air movement.  Speaking in full sentences.  Cardiovascular system: Regular rate rhythm no murmurs rubs or gallops.  No JVD.  No lower extremity edema.  Gastrointestinal system: Abdomen is soft, nontender, nondistended, positive bowel sounds.  No rebound.  No guarding.  Central nervous system: Alert and oriented. No focal neurological deficits. Extremities: Symmetric 5 x 5 power. Skin: Multiple ecchymotic areas noted on left flank, left-sided chest  wall, left shoulder.  Psychiatry: Judgement and insight appear normal. Mood & affect appropriate.     Data Reviewed: I have personally reviewed following labs and imaging studies  CBC: Recent Labs  Lab 03/14/22 1301 03/15/22 0427  WBC 7.1 2.6*  HGB 9.5* 7.5*  HCT 29.3* 22.5*  MCV 100.7* 100.0  PLT 59* 40*    Basic Metabolic Panel: Recent Labs  Lab 03/14/22 1301 03/15/22 0427  NA 136 138  K 4.3 3.9  CL 110 112*  CO2 17* 18*  GLUCOSE 155* 95  BUN 35* 43*  CREATININE 2.24* 2.51*  CALCIUM 8.8* 8.1*    GFR: Estimated Creatinine Clearance: 16.5 mL/min (A) (by C-G formula based on SCr of 2.51 mg/dL (H)).  Liver Function Tests: Recent Labs  Lab 03/14/22 1301 03/15/22 0427  AST 88* 59*  ALT 32 25  ALKPHOS 109 79  BILITOT 2.9* 2.0*  PROT 6.3* 4.9*  ALBUMIN 2.8* 1.9*    CBG: Recent Labs  Lab 03/14/22 1322 03/14/22 1657 03/14/22 2229 03/15/22 0742 03/15/22 1140  GLUCAP 134* 121* 102* 96 117*     Recent Results (from the past 240 hour(s))  Resp panel by RT-PCR (RSV, Flu A&B, Covid) Anterior Nasal Swab     Status: Abnormal   Collection Time: 03/14/22 11:12 AM   Specimen: Anterior Nasal Swab  Result Value Ref Range Status   SARS Coronavirus 2 by RT PCR NEGATIVE NEGATIVE Final    Comment: (NOTE) SARS-CoV-2 target nucleic acids are NOT DETECTED.  The SARS-CoV-2 RNA is generally detectable in upper respiratory specimens during the acute phase of infection. The lowest concentration of SARS-CoV-2 viral copies this assay can detect is 138 copies/mL. A negative result does not preclude SARS-Cov-2 infection and should not be used as the sole basis for treatment or other patient management decisions. A negative result may occur with  improper specimen collection/handling, submission of specimen other than nasopharyngeal swab, presence of viral mutation(s) within the areas targeted by this assay, and inadequate number of viral copies(<138 copies/mL). A negative  result must be combined with clinical observations, patient history, and epidemiological information. The expected result is Negative.  Fact Sheet for Patients:  EntrepreneurPulse.com.au  Fact Sheet for Healthcare Providers:  IncredibleEmployment.be  This test is no t yet approved or cleared by the [place] FDA and  has been authorized for detection and/or diagnosis of SARS-CoV-2 by FDA under an Emergency Use Authorization (EUA). This EUA will remain  in effect (meaning this test can be used) for the duration of the COVID-19 declaration under Section 564(b)(1) of the Act, 21 U.S.C.section 360bbb-3(b)(1), unless the authorization is terminated  or revoked sooner.       Influenza A by PCR POSITIVE (A) NEGATIVE Final   Influenza B by PCR NEGATIVE NEGATIVE Final    Comment: (NOTE) The Xpert Xpress SARS-CoV-2/FLU/RSV plus assay is intended as an aid in the diagnosis of influenza from Nasopharyngeal swab specimens and should not be used as a sole basis for treatment. Nasal washings and aspirates are unacceptable for Xpert Xpress SARS-CoV-2/FLU/RSV testing.  Fact Sheet for Patients: EntrepreneurPulse.com.au  Fact Sheet for Healthcare Providers: IncredibleEmployment.be  This test is not yet approved or cleared by the  [place] Architectural technologist and has been authorized for detection and/or diagnosis of SARS-CoV-2 by FDA under an Print production planner (EUA). This EUA will remain in effect (meaning this test can be used) for the duration of the COVID-19 declaration under Section 564(b)(1) of the Act, 21 U.S.C. section 360bbb-3(b)(1), unless the authorization is terminated or revoked.     Resp Syncytial Virus by PCR NEGATIVE NEGATIVE Final    Comment: (NOTE) Fact Sheet for Patients: EntrepreneurPulse.com.au  Fact Sheet for Healthcare  Providers: IncredibleEmployment.be  This test is not yet approved or cleared by the [place] FDA and has been authorized for detection and/or diagnosis of SARS-CoV-2 by FDA under an Emergency Use Authorization (EUA). This EUA will remain in effect (meaning this test can be used) for the duration of the COVID-19 declaration under Section 564(b)(1) of the Act, 21 U.S.C. section 360bbb-3(b)(1), unless the authorization is terminated or revoked.  Performed at [facility], [place] [address], [place], [place] [address]          Radiology Studies: US Abdomen Limited RUQ (LIVER/GB)  Result Date: 03/15/2022 CLINICAL DATA:  Elevated liver function tests EXAM: ULTRASOUND ABDOMEN LIMITED RIGHT UPPER QUADRANT COMPARISON:  Abdominal CT from yesterday FINDINGS: Gallbladder: History of cholecystectomy Common bile duct: Diameter: 5 mm Liver: Heterogeneous parenchyma with lobulation from known cirrhosis. There is history of breast cancer but constellation of findings suggest long term cirrhosis rather than pseudocirrhosis. The low-density area highlighted on prior study is an aneurysmal vessel there is contiguous with the portal system on clips, up to 3.6 cm. No gross solid mass lesion. No portal venous occlusion or reversal noted. IMPRESSION: Cirrhosis with portal hypertension. The low-density structure in the left lobe liver highlighted on prior noncontrast CT is a portal varix. Electronically Signed   By: Jorje Guild M.D.   On: 03/15/2022 08:00   US RENAL  Result Date: 03/14/2022 CLINICAL DATA:  AKI EXAM: RENAL / URINARY TRACT ULTRASOUND COMPLETE COMPARISON:  CT chest abdomen and pelvis March 14, 2022 FINDINGS: Right Kidney: Renal measurements: 9.3 x 4.8 x 4.3 cm = volume: 98.8 mL. Renal cortical thinning. Echogenicity within normal limits. No mass or hydronephrosis visualized. Left Kidney: Renal measurements: 9.1 x 4.0 x 3.8 cm = volume: 73.1 mL. Renal cortical  thinning. Echogenicity within normal limits. No mass or hydronephrosis visualized. Bladder: Appears normal for degree of bladder distention. Other: None. IMPRESSION: 1. Bilateral renal cortical thinning, which can be seen in the setting of medical renal disease. 2. No hydronephrosis. Electronically Signed   By: Beryle Flock M.D.   On: 03/14/2022 18:35   CT CHEST ABDOMEN PELVIS WO CONTRAST  Result Date: 03/14/2022 CLINICAL DATA:  Fall yesterday. Rib pain. Prior mastectomy for right breast cancer. Known cirrhosis. * Tracking Code: BO * EXAM: CT CHEST, ABDOMEN AND PELVIS WITHOUT CONTRAST TECHNIQUE: Multidetector CT imaging of the chest, abdomen and pelvis was performed following the standard protocol without IV contrast. RADIATION DOSE REDUCTION: This exam was performed according to the departmental dose-optimization program which includes automated exposure control, adjustment of the mA and/or kV according to patient size and/or use of iterative reconstruction technique. COMPARISON:  02/28/2020 FINDINGS: CT CHEST FINDINGS Cardiovascular: Mild degradation secondary to arm position, not raised above the head. Lack of IV contrast. Aortic atherosclerosis. Tortuous thoracic aorta. Normal heart size, without pericardial effusion. Lad coronary artery calcification. Mediastinum/Nodes: Right axillary node dissection. No axillary or subpectoral adenopathy. Calcified mediastinal and right hilar nodes are likely related to old granulomatous disease. No mediastinal  or hilar adenopathy, given limitations of unenhanced CT. Lungs/Pleura: No pleural fluid.  Mild left base scarring. Inferior right middle lobe and dependent right lower lobe areas of consolidation. Peribronchovascular nodularity within the more central right middle and right lower lobes. Example 9 mm in the right lower lobe on 81/5. These areas are all new since 02/28/2020. No pneumothorax. Musculoskeletal: Right mastectomy. Osteopenia. Minimally displaced fourth  and fifth lateral left rib fractures including on 17 and 21 of series 3 are acute. The seventh lateral right rib fracture is new in the interval but partially healed. An eighth posterolateral right rib fracture is displaced, of indeterminate acuity but unhealed on 39/3. Mild superior endplate compression deformity at T4 is new in the interval. A moderate T7 compression deformity is without ventral canal encroachment and demonstrates heterogeneous increased density within. Heterogeneous increased density within the right acromion on 05/03. CT ABDOMEN PELVIS FINDINGS Hepatobiliary: Advanced cirrhosis. Left hepatic lobe hypoattenuation centrally could represent an enlarged left portal vein when correlated with prior contrast enhanced exam but is indeterminate on 55/3. Cholecystectomy, without biliary ductal dilatation. Pancreas: Normal, without mass or ductal dilatation. Spleen: Splenomegaly at 13.9 cm craniocaudal. Old granulomatous disease within. Adrenals/Urinary Tract: Normal adrenal glands. Mild renal cortical thinning bilaterally. No hydronephrosis. Normal urinary bladder. Stomach/Bowel: Proximal gastric underdistention. Extensive colonic diverticulosis. Sigmoid wall thickening is typical of muscular hypertrophy. Normal terminal ileum and appendix. Normal small bowel. Vascular/Lymphatic: Advanced aortic and branch vessel atherosclerosis. Splenic artery aneurysm of 1.2 cm is not significantly changed. Portal venous hypertension, with a recanalized paraumbilical vein and abdominal wall collaterals. No abdominopelvic adenopathy. Reproductive: Hysterectomy.  No adnexal mass. Other: Small volume abdominopelvic fluid, new. No free intraperitoneal air. Musculoskeletal: Osteopenia. Left iliac lytic lesion of 2.6 cm on 88/3, new. A mild superior endplate compression deformity at L3 is of indeterminate acuity. IMPRESSION: 1. Low sensitivity exam secondary to lack of oral or IV contrast and patient arm position, not raised  above the head. 2. Osseous metastasis, most apparent as a lytic lesion in the left iliac wing. Suspicion of right acromion process metastasis as well. 3. Bilateral rib fractures, acute on the left and of indeterminate acuity on the right. 4. Thoracolumbar compression deformities, new since 02/27/2021,. Of indeterminate acuity. Cannot exclude pathologic fracture, including at T7. Consider nonemergent pre and postcontrast thoracic and possibly lumbar spine MRI. 5. Right lower and right middle lobe consolidation, favoring pneumonia. Concurrent peribronchovascular nodularity which is also most likely infectious. Somewhat atypical appearance of pulmonary metastasis or primary bronchogenic carcinoma felt less likely. Recommend follow-up on chest CT at 6-8 weeks. 6. Cirrhosis and portal venous hypertension with new small volume abdominopelvic ascites. Hypoattenuation in the central left lower lobe could represent an enlarged left portal vein but is indeterminate on this noncontrast exam. This could be further evaluated with nonemergent pre and post contrast abdominal MRI. Alternatively, if the patient is eventually scheduled for PET, this would likely be informative. 7. Similar 12 mm splenic artery aneurysm. 8. Incidental findings, including: Coronary artery atherosclerosis. Aortic Atherosclerosis (ICD10-I70.0). Electronically Signed   By: Abigail Miyamoto M.D.   On: 03/14/2022 14:49   CT Head Wo Contrast  Result Date: 03/14/2022 CLINICAL DATA:  Status post fall EXAM: CT HEAD WITHOUT CONTRAST CT CERVICAL SPINE WITHOUT CONTRAST TECHNIQUE: Multidetector CT imaging of the head and cervical spine was performed following the standard protocol without intravenous contrast. Multiplanar CT image reconstructions of the cervical spine were also generated. RADIATION DOSE REDUCTION: This exam was performed according to the departmental dose-optimization  program which includes automated exposure control, adjustment of the mA and/or kV  according to patient size and/or use of iterative reconstruction technique. COMPARISON:  None Available. FINDINGS: CT HEAD FINDINGS Brain: Bilateral low-attenuation sub dural fluid collections overlie the cerebral image spheres compatible with chronic subdural hygromas. Overlying the right frontoparietal convexity there is a 4 mm intermediate density subpleural fluid subdural fluid collection which measures Hounsfield units between 50 and 60, image 28/6 and image 26/5. No definite hyperdense subdural fluid collections identified. There is no significant midline shift or mass effect. No signs of acute brain infarct. There is mild diffuse low-attenuation within the subcortical and periventricular white matter compatible with chronic microvascular disease. Vascular: No hyperdense vessel or unexpected calcification. Skull: Normal. Negative for fracture or focal lesion. Sinuses/Orbits: No acute finding. Other: None CT CERVICAL SPINE FINDINGS Alignment: Normal. Skull base and vertebrae: There is an acute mildly displaced fracture involving the posterior spinous process T1, image 71/5. Lucent lesion is identified within the posterior aspect of the T1 vertebral body measuring 1.4 cm, image 73/5. Soft tissues and spinal canal: No prevertebral fluid or swelling. No visible canal hematoma. Disc levels: Multilevel disc space narrowing and endplate spurring noted within the cervical spine. Upper chest: No pneumothorax identified within the lung apices. Permeative appearance of the right coracoid process of the right scapula, image 79/5. Other: None IMPRESSION: 1. Bilateral low-attenuation sub dural fluid collections overlie the cerebral image spheres compatible with chronic subdural hygromas. 2. Overlying the right frontoparietal convexity there is a 4 mm intermediate density subpleural fluid collection which measures Hounsfield units between 50 and 60. This is favored to represent a subacute subdural hematoma. 3. Acute mildly  displaced fracture involving the posterior spinous process of T1. 4. Lucent lesion is identified within the posterior aspect of the T1 vertebral body measuring 1.4 cm. Permeative appearance of the right coracoid process of the right scapula. Findings are concerning for metastatic disease. 5. Chronic small vessel ischemic disease and brain atrophy. Critical Value/emergent results were called by telephone at the time of interpretation on 03/14/2022 at 2:43 pm to provider [facility] , who verbally acknowledged these results. Electronically Signed   By: Kerby Moors M.D.   On: 03/14/2022 14:44   CT Cervical Spine Wo Contrast  Result Date: 03/14/2022 CLINICAL DATA:  Status post fall EXAM: CT HEAD WITHOUT CONTRAST CT CERVICAL SPINE WITHOUT CONTRAST TECHNIQUE: Multidetector CT imaging of the head and cervical spine was performed following the standard protocol without intravenous contrast. Multiplanar CT image reconstructions of the cervical spine were also generated. RADIATION DOSE REDUCTION: This exam was performed according to the departmental dose-optimization program which includes automated exposure control, adjustment of the mA and/or kV according to patient size and/or use of iterative reconstruction technique. COMPARISON:  None Available. FINDINGS: CT HEAD FINDINGS Brain: Bilateral low-attenuation sub dural fluid collections overlie the cerebral image spheres compatible with chronic subdural hygromas. Overlying the right frontoparietal convexity there is a 4 mm intermediate density subpleural fluid subdural fluid collection which measures Hounsfield units between 50 and 60, image 28/6 and image 26/5. No definite hyperdense subdural fluid collections identified. There is no significant midline shift or mass effect. No signs of acute brain infarct. There is mild diffuse low-attenuation within the subcortical and periventricular white matter compatible with chronic microvascular disease. Vascular: No  hyperdense vessel or unexpected calcification. Skull: Normal. Negative for fracture or focal lesion. Sinuses/Orbits: No acute finding. Other: None CT CERVICAL SPINE FINDINGS Alignment: Normal. Skull base and vertebrae: There is  an acute mildly displaced fracture involving the posterior spinous process T1, image 71/5. Lucent lesion is identified within the posterior aspect of the T1 vertebral body measuring 1.4 cm, image 73/5. Soft tissues and spinal canal: No prevertebral fluid or swelling. No visible canal hematoma. Disc levels: Multilevel disc space narrowing and endplate spurring noted within the cervical spine. Upper chest: No pneumothorax identified within the lung apices. Permeative appearance of the right coracoid process of the right scapula, image 79/5. Other: None IMPRESSION: 1. Bilateral low-attenuation sub dural fluid collections overlie the cerebral image spheres compatible with chronic subdural hygromas. 2. Overlying the right frontoparietal convexity there is a 4 mm intermediate density subpleural fluid collection which measures Hounsfield units between 50 and 60. This is favored to represent a subacute subdural hematoma. 3. Acute mildly displaced fracture involving the posterior spinous process of T1. 4. Lucent lesion is identified within the posterior aspect of the T1 vertebral body measuring 1.4 cm. Permeative appearance of the right coracoid process of the right scapula. Findings are concerning for metastatic disease. 5. Chronic small vessel ischemic disease and brain atrophy. Critical Value/emergent results were called by telephone at the time of interpretation on 03/14/2022 at 2:43 pm to provider [facility] , who verbally acknowledged these results. Electronically Signed   By: Kerby Moors M.D.   On: 03/14/2022 14:44   DG Chest 2 View  Result Date: 03/14/2022 CLINICAL DATA:  Fall, rib pain, cough, and shortness of breath. EXAM: CHEST - 2 VIEW COMPARISON:  Chest radiographs 08/07/2009  and CT 02/28/2020 FINDINGS: The cardiomediastinal silhouette is unchanged with normal heart size. The lungs are hyperinflated. New patchy opacity is present in the medial right lung base. The left lung is clear. There could be trace pleural effusions. No pneumothorax is identified. There is a mildly displaced posterolateral right eighth rib fracture, and a non or minimally displaced seventh rib fracture is also suspected. Surgical clips are noted in the right axilla. There is a midthoracic vertebral compression fracture with moderate vertebral body height loss which is new from 2021 and of indeterminate age. IMPRESSION: 1. Right seventh and eighth rib fractures. No pneumothorax. 2. New medial right basilar airspace opacity suspicious for pneumonia. Followup PA and lateral chest radiographs are recommended in 3-4 weeks following trial of antibiotic therapy to ensure resolution and exclude underlying malignancy. 3. Possible trace pleural effusions. 4. Age-indeterminate midthoracic vertebral compression fracture. Electronically Signed   By: Logan Bores M.D.   On: 03/14/2022 12:02        Scheduled Meds:  acetaminophen  1,000 mg Oral Q6H   calcium carbonate  1,250 mg Oral Q breakfast   cholecalciferol  1,000 Units Oral Daily   cyanocobalamin  1,000 mcg Oral Daily   docusate sodium  100 mg Oral BID   fluticasone  2 spray Each Nare Daily   folic acid  1 mg Oral Daily   guaiFENesin  600 mg Oral BID   insulin aspart  0-6 Units Subcutaneous TID WC   ipratropium-albuterol  3 mL Nebulization BID   levothyroxine  112 mcg Oral Q0600   loratadine  10 mg Oral Daily   methocarbamol  1,000 mg Oral TID   oseltamivir  30 mg Oral Daily   pantoprazole  40 mg Oral Daily   Continuous Infusions:  azithromycin 250 mL/hr at 03/15/22 1628   cefTRIAXone (ROCEPHIN)  IV Stopped (03/15/22 0902)   lactated ringers Stopped (03/15/22 1621)     LOS: 1 day    Time spent: 40  minutes    Irine Seal, MD [facility]   To contact the attending provider between 7A-7P or the covering provider during after hours 7P-7A, please log into the web site [email or web address] and access using universal [place] password for that web site. If you do not have the password, please call the hospital operator.  03/15/2022, 5:45 PM

## 2022-03-16 LAB — COMPREHENSIVE METABOLIC PANEL
ALT: 26 U/L (ref 0–44)
AST: 64 U/L — ABNORMAL HIGH (ref 15–41)
Albumin: 2.3 g/dL — ABNORMAL LOW (ref 3.5–5.0)
Alkaline Phosphatase: 100 U/L (ref 38–126)
Anion gap: 8 (ref 5–15)
BUN: 47 mg/dL — ABNORMAL HIGH (ref 8–23)
CO2: 17 mmol/L — ABNORMAL LOW (ref 22–32)
Calcium: 8.1 mg/dL — ABNORMAL LOW (ref 8.9–10.3)
Chloride: 111 mmol/L (ref 98–111)
Creatinine, Ser: 2.56 mg/dL — ABNORMAL HIGH (ref 0.44–1.00)
GFR, Estimated: 18 mL/min — ABNORMAL LOW (ref >60)
Glucose, Bld: 92 mg/dL (ref 70–99)
Potassium: 3.9 mmol/L (ref 3.5–5.1)
Sodium: 136 mmol/L (ref 135–145)
Total Bilirubin: 1.5 mg/dL — ABNORMAL HIGH (ref 0.3–1.2)
Total Protein: 5.3 g/dL — ABNORMAL LOW (ref 6.5–8.1)

## 2022-03-16 LAB — CBC WITH DIFFERENTIAL/PLATELET
Abs Immature Granulocytes: 0.02 10*3/uL (ref 0.00–0.07)
Basophils Absolute: 0 10*3/uL (ref 0.0–0.1)
Basophils Relative: 0 %
Eosinophils Absolute: 0.1 10*3/uL (ref 0.0–0.5)
Eosinophils Relative: 3 %
HCT: 25.1 % — ABNORMAL LOW (ref 36.0–46.0)
Hemoglobin: 8.1 g/dL — ABNORMAL LOW (ref 12.0–15.0)
Immature Granulocytes: 1 %
Lymphocytes Relative: 13 %
Lymphs Abs: 0.4 10*3/uL — ABNORMAL LOW (ref 0.7–4.0)
MCH: 32.7 pg (ref 26.0–34.0)
MCHC: 32.3 g/dL (ref 30.0–36.0)
MCV: 101.2 fL — ABNORMAL HIGH (ref 80.0–100.0)
Monocytes Absolute: 0.2 10*3/uL (ref 0.1–1.0)
Monocytes Relative: 7 %
Neutro Abs: 2.2 10*3/uL (ref 1.7–7.7)
Neutrophils Relative %: 76 %
Platelets: 50 10*3/uL — ABNORMAL LOW (ref 150–400)
RBC: 2.48 MIL/uL — ABNORMAL LOW (ref 3.87–5.11)
RDW: 15.8 % — ABNORMAL HIGH (ref 11.5–15.5)
WBC: 2.9 10*3/uL — ABNORMAL LOW (ref 4.0–10.5)
nRBC: 0 % (ref 0.0–0.2)

## 2022-03-16 LAB — GLUCOSE, CAPILLARY
Glucose-Capillary: 94 mg/dL (ref 70–99)
Glucose-Capillary: 94 mg/dL (ref 70–99)
Glucose-Capillary: 108 mg/dL — ABNORMAL HIGH (ref 70–99)
Glucose-Capillary: 138 mg/dL — ABNORMAL HIGH (ref 70–99)

## 2022-03-16 LAB — URINE CULTURE: Culture: NO GROWTH

## 2022-03-16 LAB — LACTIC ACID, PLASMA: Lactic Acid, Venous: 3.1 mmol/L (ref 0.5–1.9)

## 2022-03-16 MED ORDER — IPRATROPIUM-ALBUTEROL 0.5-2.5 (3) MG/3ML IN SOLN
3.0000 mL | Freq: Two times a day (BID) | RESPIRATORY_TRACT | Status: DC
  Administered 2022-03-16 – 2022-03-21 (×11): 3 mL via RESPIRATORY_TRACT
  Filled 2022-03-16 (×10): qty 3

## 2022-03-16 MED ORDER — LACTATED RINGERS IV BOLUS
250.0000 mL | Freq: Once | INTRAVENOUS | Status: AC
  Administered 2022-03-16: 250 mL via INTRAVENOUS

## 2022-03-16 MED ORDER — SODIUM CHLORIDE 0.9 % IV BOLUS
500.0000 mL | Freq: Once | INTRAVENOUS | Status: AC
  Administered 2022-03-16: 500 mL via INTRAVENOUS

## 2022-03-16 MED ORDER — GUAIFENESIN ER 600 MG PO TB12
1200.0000 mg | ORAL_TABLET | Freq: Two times a day (BID) | ORAL | Status: DC
  Administered 2022-03-16 – 2022-04-03 (×36): 1200 mg via ORAL
  Filled 2022-03-16 (×36): qty 2

## 2022-03-16 NOTE — Evaluation (Signed)
Physical Therapy Evaluation Patient Details Name: Catherine Decker MRN: [id number] DOB: 01/25/1941 Today's Date: 03/16/2022  History of Present Illness  Patient is a 82 year old female who presented after a fall down some stairs. Patient was found to have left rib fractures 4-5, right rib fractures 7-8, T7 compression fx, T1 spinous process fx, SDH.  Pt also found to have influenza A. LOV:FIEPP breast cancer, arthritis, hyperlipidemia, splenic artery aneurysm  Clinical Impression  Pt admitted with above diagnosis.  Pt currently with functional limitations due to the deficits listed below (see PT Problem List). Pt will benefit from skilled PT to increase their independence and safety with mobility to allow discharge to the venue listed below.  Pt with fall down stairs prior to admission.  Pt reports her bedroom is upstairs and she lives with her daughter's father in law.  Pt would prefer to d/c home with a friend that does not have stairs however uncertain of this possibility so pt may need SNF upon d/c.         Recommendations for follow up therapy are one component of a multi-disciplinary discharge planning process, led by the attending physician.  Recommendations may be updated based on patient status, additional functional criteria and insurance authorization.  Follow Up Recommendations Skilled nursing-short term rehab (<3 hours/day) Can patient physically be transported by private vehicle: Yes    Assistance Recommended at Discharge PRN  Patient can return home with the following  Assistance with cooking/housework;Assist for transportation;Help with stairs or ramp for entrance    Equipment Recommendations None recommended by PT  Recommendations for Other Services       Functional Status Assessment Patient has had a recent decline in their functional status and demonstrates the ability to make significant improvements in function in a reasonable and predictable amount of time.      Precautions / Restrictions Precautions Precautions: Fall Precaution Comments: rib fractures, back preacutions      Mobility  Bed Mobility Overal bed mobility: Needs Assistance Bed Mobility: Supine to Sit, Sit to Supine     Supine to sit: Supervision Sit to supine: Supervision   General bed mobility comments: with increased time    Transfers Overall transfer level: Needs assistance Equipment used: None Transfers: Sit to/from Stand, Bed to chair/wheelchair/BSC Sit to Stand: Supervision           General transfer comment: used BSC with supervision    Ambulation/Gait Ambulation/Gait assistance: Min guard Gait Distance (Feet): 12 Feet Assistive device: IV Pole Gait Pattern/deviations: Trunk flexed, Decreased stride length, Step-through pattern       General Gait Details: no RW in room, pt utilized IV pole and held furniture around room, fatigued quickly  Science writer    Modified Rankin (Stroke Patients Only)       Balance Overall balance assessment: History of Falls                                           Pertinent Vitals/Pain Pain Assessment Pain Assessment: No/denies pain    Home Living Family/patient expects to be discharged to:: Private residence Living Arrangements: Other (Comment) (daughter's father in [employer]) Available Help at Discharge: Family Type of Home: House Home Access: Ramped entrance     Alternate Level Stairs-Number of Steps: flight Home Layout: Two level;Bed/bath upstairs Home Equipment:  Rolling Walker (2 wheels) Additional Comments: hoping to go home with a friend that doesn't have stairs    Prior Function Prior Level of Function : Independent/Modified Independent             Mobility Comments: uses RW downstairs       Hand Dominance        Extremity/Trunk Assessment        Lower Extremity Assessment Lower Extremity Assessment: Generalized weakness    Cervical  / Trunk Assessment Cervical / Trunk Exceptions: fall down stairs prior to admission with various areas of ecchymosis around trunk  Communication   Communication: No difficulties  Cognition Arousal/Alertness: Awake/alert Behavior During Therapy: WFL for tasks assessed/performed Overall Cognitive Status: Within Functional Limits for tasks assessed                                 General Comments: hoarse voice        General Comments      Exercises     Assessment/Plan    PT Assessment Patient needs continued PT services  PT Problem List Decreased strength;Decreased mobility;Decreased activity tolerance;Decreased balance;Decreased knowledge of use of DME       PT Treatment Interventions Gait training;DME instruction;Therapeutic exercise;Balance training;Functional mobility training;Stair training;Therapeutic activities;Patient/family education    PT Goals (Current goals can be found in the Care Plan section)  Acute Rehab PT Goals PT Goal Formulation: With patient Time For Goal Achievement: 03/30/22 Potential to Achieve Goals: Good    Frequency Min 3X/week     Co-evaluation               AM-PAC PT "6 Clicks" Mobility  Outcome Measure Help needed turning from your back to your side while in a flat bed without using bedrails?: A Little Help needed moving from lying on your back to sitting on the side of a flat bed without using bedrails?: A Little Help needed moving to and from a bed to a chair (including a wheelchair)?: A Little Help needed standing up from a chair using your arms (e.g., wheelchair or bedside chair)?: A Little Help needed to walk in hospital room?: A Little Help needed climbing 3-5 steps with a railing? : A Lot 6 Click Score: 17    End of Session   Activity Tolerance: Patient limited by fatigue Patient left: in bed;with call bell/phone within reach;with nursing/sitter in room Nurse Communication: Mobility status PT Visit Diagnosis:  Difficulty in walking, not elsewhere classified (R26.2);Unsteadiness on feet (R26.81)    Time: 1207-1221 PT Time Calculation (min) (ACUTE ONLY): 14 min   Charges:   PT Evaluation $PT Eval Low Complexity: 1 Low         Kati PT, DPT Physical Therapist Acute Rehabilitation Services Preferred contact method: Secure Chat Weekend Pager Only: [phone] Office: [place] 03/16/2022, 1:49 PM

## 2022-03-17 DIAGNOSIS — N179 Acute kidney failure, unspecified: Secondary | ICD-10-CM | POA: Diagnosis not present

## 2022-03-17 DIAGNOSIS — W19XXXD Unspecified fall, subsequent encounter: Secondary | ICD-10-CM | POA: Diagnosis not present

## 2022-03-17 DIAGNOSIS — S065XAA Traumatic subdural hemorrhage with loss of consciousness status unknown, initial encounter: Secondary | ICD-10-CM | POA: Diagnosis not present

## 2022-03-17 DIAGNOSIS — S2243XA Multiple fractures of ribs, bilateral, initial encounter for closed fracture: Secondary | ICD-10-CM | POA: Diagnosis not present

## 2022-03-17 LAB — CBC WITH DIFFERENTIAL/PLATELET
Abs Immature Granulocytes: 0.13 10*3/uL — ABNORMAL HIGH (ref 0.00–0.07)
Basophils Absolute: 0 10*3/uL (ref 0.0–0.1)
Basophils Relative: 0 %
Eosinophils Absolute: 0.1 10*3/uL (ref 0.0–0.5)
Eosinophils Relative: 2 %
HCT: 27.7 % — ABNORMAL LOW (ref 36.0–46.0)
Hemoglobin: 8.7 g/dL — ABNORMAL LOW (ref 12.0–15.0)
Immature Granulocytes: 4 %
Lymphocytes Relative: 12 %
Lymphs Abs: 0.4 10*3/uL — ABNORMAL LOW (ref 0.7–4.0)
MCH: 32.6 pg (ref 26.0–34.0)
MCHC: 31.4 g/dL (ref 30.0–36.0)
MCV: 103.7 fL — ABNORMAL HIGH (ref 80.0–100.0)
Monocytes Absolute: 0.3 10*3/uL (ref 0.1–1.0)
Monocytes Relative: 7 %
Neutro Abs: 2.6 10*3/uL (ref 1.7–7.7)
Neutrophils Relative %: 75 %
Platelets: 59 10*3/uL — ABNORMAL LOW (ref 150–400)
RBC: 2.67 MIL/uL — ABNORMAL LOW (ref 3.87–5.11)
RDW: 15.9 % — ABNORMAL HIGH (ref 11.5–15.5)
WBC: 3.5 10*3/uL — ABNORMAL LOW (ref 4.0–10.5)
nRBC: 0.6 % — ABNORMAL HIGH (ref 0.0–0.2)

## 2022-03-17 LAB — PHOSPHORUS: Phosphorus: 3.3 mg/dL (ref 2.5–4.6)

## 2022-03-17 LAB — COMPREHENSIVE METABOLIC PANEL
ALT: 28 U/L (ref 0–44)
AST: 76 U/L — ABNORMAL HIGH (ref 15–41)
Albumin: 2 g/dL — ABNORMAL LOW (ref 3.5–5.0)
Alkaline Phosphatase: 106 U/L (ref 38–126)
Anion gap: 8 (ref 5–15)
BUN: 46 mg/dL — ABNORMAL HIGH (ref 8–23)
CO2: 17 mmol/L — ABNORMAL LOW (ref 22–32)
Calcium: 8.1 mg/dL — ABNORMAL LOW (ref 8.9–10.3)
Chloride: 109 mmol/L (ref 98–111)
Creatinine, Ser: 2.24 mg/dL — ABNORMAL HIGH (ref 0.44–1.00)
GFR, Estimated: 22 mL/min — ABNORMAL LOW (ref >60)
Glucose, Bld: 90 mg/dL (ref 70–99)
Potassium: 3.8 mmol/L (ref 3.5–5.1)
Sodium: 134 mmol/L — ABNORMAL LOW (ref 135–145)
Total Bilirubin: 1.4 mg/dL — ABNORMAL HIGH (ref 0.3–1.2)
Total Protein: 5.3 g/dL — ABNORMAL LOW (ref 6.5–8.1)

## 2022-03-17 LAB — GLUCOSE, CAPILLARY
Glucose-Capillary: 136 mg/dL — ABNORMAL HIGH (ref 70–99)
Glucose-Capillary: 80 mg/dL (ref 70–99)
Glucose-Capillary: 88 mg/dL (ref 70–99)
Glucose-Capillary: 98 mg/dL (ref 70–99)
Glucose-Capillary: 86 mg/dL (ref 70–99)

## 2022-03-17 LAB — HEMOGLOBIN A1C
Hgb A1c MFr Bld: 4.8 % (ref 4.8–5.6)
Hgb A1c MFr Bld: 4.9 % (ref 4.8–5.6)
Mean Plasma Glucose: 91 mg/dL
Mean Plasma Glucose: 94 mg/dL

## 2022-03-17 LAB — MAGNESIUM: Magnesium: 1.6 mg/dL — ABNORMAL LOW (ref 1.7–2.4)

## 2022-03-17 LAB — LACTIC ACID, PLASMA: Lactic Acid, Venous: 2.6 mmol/L (ref 0.5–1.9)

## 2022-03-17 MED ORDER — SODIUM BICARBONATE 650 MG PO TABS
650.0000 mg | ORAL_TABLET | Freq: Two times a day (BID) | ORAL | Status: AC
  Administered 2022-03-17 – 2022-03-19 (×6): 650 mg via ORAL
  Filled 2022-03-17 (×6): qty 1

## 2022-03-17 MED ORDER — MAGNESIUM SULFATE 4 GM/100ML IV SOLN
4.0000 g | Freq: Once | INTRAVENOUS | Status: AC
  Administered 2022-03-17: 4 g via INTRAVENOUS
  Filled 2022-03-17: qty 100

## 2022-03-17 NOTE — TOC Initial Note (Signed)
Transition of Care [facility]) - Initial/Assessment Note    Patient Details  Name: Catherine Decker MRN: [id number] Date of Birth: 1940/04/23  [facility] CM/SW Contact:    Catherine Regulus, LCSW Phone Number: 03/17/2022, 1:03 PM  Clinical Narrative:                 TOC CSW spoke with pt's daughter Catherine Decker  to discuss SNF placement recommendations. Pt's daughter stated " I think that will be a good idea." Pt daughter reported pt has never been placed in a rehab before. CSW explained the SNF process. Pt's daughter asked about pt's medical status, CSW informed pt's daughter she would need to speak with pt's RN or MD about any medical questions she may have.CSW to work pt up for rehab. TOC to follow.  Expected Discharge Plan: Skilled Nursing Facility Barriers to Discharge: Continued Medical Work up   Patient Goals and CMS Choice   CMS Medicare.gov Compare Post Acute Care list provided to:: Patient Represenative (must comment) Choice offered to / list presented to : Adult Children      Expected Discharge Plan and Services       Living arrangements for the past 2 months: Single Family Home                                      Prior Living Arrangements/Services Living arrangements for the past 2 months: Single Family Home Lives with:: Self Patient language and need for interpreter reviewed:: No Do you feel safe going back to the place where you live?: Yes      Need for Family Participation in Patient Care: Yes (Comment) Care giver support system in place?: Yes (comment)   Criminal Activity/Legal Involvement Pertinent to Current Situation/Hospitalization: No - Comment as needed  Activities of Daily Living Home Assistive Devices/Equipment: Gilford Rile (specify type) ADL Screening (condition at time of admission) Patient's cognitive ability adequate to safely complete daily activities?: Yes Is the patient deaf or have difficulty hearing?: No Does the  patient have difficulty seeing, even when wearing glasses/contacts?: No Does the patient have difficulty concentrating, remembering, or making decisions?: No Patient able to express need for assistance with ADLs?: Yes Does the patient have difficulty dressing or bathing?: No Independently performs ADLs?: Yes (appropriate for developmental age) Does the patient have difficulty walking or climbing stairs?: Yes Weakness of Legs: None Weakness of Arms/Hands: None  Permission Sought/Granted                  Emotional Assessment Appearance:: Appears stated age Attitude/Demeanor/Rapport: Unable to Assess Affect (typically observed): Unable to Assess Orientation: : Oriented to Self, Oriented to Place, Oriented to  Time, Oriented to Situation   Psych Involvement: No (comment)  Admission diagnosis:  SDH (subdural hematoma) (HCC) [S06.[place] [id number]] Closed fracture of multiple ribs of both sides, initial encounter [S22.43XA] COVID [U07.1] Patient Active Problem List   Diagnosis Date Noted   Pancytopenia ([place]) 03/15/2022   SDH (subdural hematoma) ([place]) 03/15/2022   Pressure injury of skin 03/15/2022   Falls 03/14/2022   Multiple rib fractures 03/14/2022   Closed fracture of spinous process of thoracic vertebra ([place]) 03/14/2022   Hygroma 03/14/2022   Subdural hematoma ([place]) 03/14/2022   CAP (community acquired pneumonia) 03/14/2022   Influenza A 03/14/2022   AKI (acute kidney injury) ([place]) 03/14/2022   Stage 3b chronic kidney disease (CKD) ([place]) 03/14/2022  HTN (hypertension) 03/14/2022   HLD (hyperlipidemia) 03/14/2022   Hypothyroidism 03/14/2022   Lactic acidosis 03/14/2022   Macrocytic anemia 03/14/2022   Thrombocytopenia ([place]) 03/14/2022   DM2 (diabetes mellitus, type 2) ([place]) 11/05/2020   Shortness of breath 08/28/2020   Osteoporosis 11/04/2019   Cirrhosis ([place])    Breast cancer of upper-outer quadrant of right female breast ([place]) 09/07/2019   Malignant neoplasm  of upper-outer quadrant of right breast in female, estrogen receptor positive ([place]) 07/06/2019   Aneurysm of splenic artery ([place]) 06/05/2011   PCP:  Antony Contras, MD Pharmacy:   [facility], [address] [place] [address] Phone: [phone] Fax: [phone]     Social Determinants of Health (SDOH) Social History: [place]: No Food Insecurity (03/14/2022)  Housing: Low Risk  (03/14/2022)  Transportation Needs: No Transportation Needs (03/14/2022)  Utilities: Not At Risk (03/14/2022)  Tobacco Use: Low Risk  (03/14/2022)   SDOH Interventions:     Readmission Risk Interventions     No data to display

## 2022-03-17 NOTE — Progress Notes (Signed)
Mobility Specialist - Progress Note   03/17/22 1131  Mobility  Activity Ambulated with assistance in room  Level of Assistance Standby assist, set-up cues, supervision of patient - no hands on  Assistive Device Front wheel walker  Distance Ambulated (ft) 28 ft  Range of Motion/Exercises Active  Activity Response Tolerated well  Mobility Referral Yes  $Mobility charge 1 Mobility   Pt was found in bed and agreeable to ambulate in room due to feeling sleepy. After ambulation stated wanting to use to [facility] and that she was fatigued. At EOS returned to bed with necessities in reach.   Ferd Hibbs Mobility Specialist

## 2022-03-17 NOTE — Progress Notes (Signed)
PROGRESS NOTE    Catherine Decker  RXV:[id number] DOB: 1940/09/24 DOA: 03/14/2022 PCP: Antony Contras, MD    Chief Complaint  Patient presents with   Fall   Diarrhea   Weakness   Cough    Brief Narrative:  Patient 82 year old female history of hypothyroidism, hypertension presented to the ED after she fell down the stairs 2 days prior to admission slipped while walking hit her head on the way down but did not suffer any LOC.  Patient was able to get up with help from a friend however decided to stay home.  Patient's daughter noted that patient with more confusion and fatigue over the past 2 days prior to admission with decreased appetite and a 1-1/2-week history of cough and congestion and subsequently brought to the ED as patient with no clinical improvement.  Patient seen in the ED, workup done concerning for subdural hematoma, chronic hygromas, multiple rib fractures, pneumonia, influenza A, AKI on CKD stage IIIb.  Neurosurgery reviewed films and felt no further workup needed intervention at this time.  Trauma surgery also assessed the patient and recommended pain management, pulmonary toileting.  Patient admitted placed on empiric IV antibiotics and Tamiflu.   Assessment & Plan:   Principal Problem:   Falls Active Problems:   Malignant neoplasm of upper-outer quadrant of right breast in female, estrogen receptor positive ([place])   DM2 (diabetes mellitus, type 2) ([place])   Multiple rib fractures   Closed fracture of spinous process of thoracic vertebra (HCC)   Hygroma   Subdural hematoma (HCC)   CAP (community acquired pneumonia)   Influenza A   AKI (acute kidney injury) ([place])   Stage 3b chronic kidney disease (CKD) (HCC)   HTN (hypertension)   HLD (hyperlipidemia)   Hypothyroidism   Lactic acidosis   Macrocytic anemia   Thrombocytopenia (HCC)   Pancytopenia (HCC)   SDH (subdural hematoma) (HCC)   Pressure injury of skin  #1 fall/multiple rib fractures/spinous process  fracture of T1/subdural hematoma/chronic hygromas -Secondary to mechanical fall. -Patient assessed by the trauma team who reviewed imaging and recommended pain management, pulmonary toileting, incentive spirometry use secondary to rib fractures to prevent worsening pneumonia and no further intervention at this time. -Continue incentive spirometry, flutter valve, treatment of pneumonia, pain management. -Continue Robaxin, scheduled Tylenol. -Films reviewed by neurosurgery who feel no intervention needed at this time. -PT/OT.  2.  Influenza A -Tamiflu.  3.  Right-sided pneumonia -Noted on CT chest. -Patient with fall with rib fractures, likely can take deep inspiratory effort. -Urine strep pneumococcus antigen positive. -Urine Legionella antigen pending. -Continue IV Rocephin. -Discontinue IV azithromycin. -Continue scheduled DuoNebs, Flonase, Claritin, PPI, Mucinex.  4.  Hypothyroidism -Synthroid.  5.  AKI on CKD stage IIIb -Likely secondary to volume depletion, dehydration in the setting of ARB. -Renal ultrasound negative for hydronephrosis. -Urinalysis with concerns for UTI. -Urine sodium of 13, urine creatinine 195. -Creatinine initially with a slight bump however trending back down currently at 2.24 from 2.56 from 2.51 from 2.24 on admission. -Continue IV fluids at 125 cc an hour.   -Continue to hold ARB, avoid nephrotoxins.   -Monitor urine output.   -Repeat labs in the AM.  6.  UTI -Urinalysis concerning for UTI. -Urine cultures negative.  -Patient empirically on IV Rocephin secondary to right-sided pneumonia.  7.  Hyperlipidemia -Continue to hold home regimen.  8.  History of breast cancer status postmastectomy/spinal lesions -Imaging with T1 a right scapular lesion concerning for mets. -Patient's primary oncologist, Dr.  Lorenso Courier has been notified via epic of patient's admission. -Continue home regimen letrozole.  -Will likely need further outpatient follow-up with  oncology.  9.  Transaminitis -Likely secondary to history of cirrhosis. -Right upper quadrant ultrasound consistent with cirrhosis with portal hypertension, low-density structure in the left lobe of the liver highlighted on prior noncontrast CTs up oropharynx. -LFTs trending down from admission and close to baseline. -Outpatient follow-up.  10.  History of cirrhosis -Outpatient follow-up with primary hepatologist.  11.  Pancytopenia -Patient with a pancytopenia being followed by hematology/oncology in the outpatient setting. -Worsening thrombocytopenia and now patient with a leukopenia unchanged from prior leukopenia likely due to acute infection. -Thrombocytopenia and leukopenia improving daily. -Patient with no bleeding. -Hematology/oncology informed of admission via epic. -Follow.  12.  Lactic acidosis -Likely secondary to acute infection of pneumonia and influenza A. -Lactic acid levels trending down. -Continue IV fluids, IV antibiotics. -Repeat labs in the AM.  13.  Diabetes mellitus type 2 -Hemoglobin A1c 5.0 (11/05/2020). -CBG 80 this morning. -Hemoglobin A1c 4.9.   -Continue to hold oral hypoglycemic agents.  -Discontinue SSI, CBGs.  -Outpatient follow-up.   14.  Hypertension -BP stable and somewhat soft.. -Hold antihypertensive medications.   -Continue IV fluids at 125 cc an hour.   15.  History of breast cancer -Continue home regimen letrozole.   -Oncology notified via epic of admission.  16.  Pressure injury, stage I medial buttocks, POA Pressure Injury 03/14/22 Buttocks Medial;Mid Stage 1 -  Intact skin with non-blanchable redness of a localized area usually over a bony prominence. (Active)  03/14/22 2200  Location: Buttocks  Location Orientation: Medial;Mid  Staging: Stage 1 -  Intact skin with non-blanchable redness of a localized area usually over a bony prominence.  Wound Description (Comments):   Present on Admission: Yes       DVT prophylaxis:  SCDs Code Status: DNR Family Communication: Updated patient.  No family at bedside. Disposition: TBD.  Status is: Inpatient Remains inpatient appropriate because: Severity of illness.   Consultants:  Oncology notified of admission via epic. General surgery/trauma team: Dr.Lovick 03/14/2022  Procedures:  CT head CT C-spine 03/14/2022 Chest x-ray 03/14/2022 Renal ultrasound 03/14/2022 Right upper quadrant ultrasound 03/15/2022 CT chest abdomen and pelvis 03/14/2022  Antimicrobials:  IV azithromycin 03/14/2022>>>>> 03/17/2022 IV Rocephin 03/14/2022>>>>>>   Subjective: Laying in bed.  Complaining of losing her voice.  Denies any significant chest pain.  Slight improvement with shortness of breath.  Good urine output.  Having bowel movements.  Pain currently controlled.    Objective: Vitals:   03/16/22 2007 03/16/22 2132 03/17/22 0500 03/17/22 0941  BP:  (!) 136/48 (!) 140/72   Pulse:  99 86   Resp:      Temp:  98.1 F (36.7 C) 98.3 F (36.8 C)   TempSrc:  Oral Oral   SpO2: 97% 96% 94% 95%  Weight:   73.2 kg   Height:        Intake/Output Summary (Last 24 hours) at 03/17/2022 1329 Last data filed at 03/16/2022 2237 Gross per 24 hour  Intake 574.31 ml  Output 4 ml  Net 570.31 ml    Filed Weights   03/14/22 2314 03/16/22 0500 03/17/22 0500  Weight: 67.7 kg 73.9 kg 73.2 kg    Examination:  General exam: NAD Respiratory system: Decreased coarse breath sounds on the right.  No wheezing.  No crackles.  Fair air movement.  Speaking in full sentences.  Cardiovascular system: RRR no murmurs rubs or gallops.  No JVD.  No significant lower extremity edema.  Gastrointestinal system: Abdomen is soft, nontender, nondistended, positive bowel sounds.  No rebound.  No guarding.  Central nervous system: Alert and oriented. No focal neurological deficits. Extremities: Symmetric 5 x 5 power. Skin: Multiple ecchymotic areas noted on left flank, left-sided chest wall, left shoulder.  Psychiatry:  Judgement and insight appear normal. Mood & affect appropriate.     Data Reviewed: I have personally reviewed following labs and imaging studies  CBC: Recent Labs  Lab 03/14/22 1301 03/15/22 0427 03/16/22 0512 03/17/22 0423  WBC 7.1 2.6* 2.9* 3.5*  NEUTROABS  --   --  2.2 2.6  HGB 9.5* 7.5* 8.1* 8.7*  HCT 29.3* 22.5* 25.1* 27.7*  MCV 100.7* 100.0 101.2* 103.7*  PLT 59* 40* 50* 59*     Basic Metabolic Panel: Recent Labs  Lab 03/14/22 1301 03/15/22 0427 03/16/22 0512 03/17/22 0423  NA 136 138 136 134*  K 4.3 3.9 3.9 3.8  CL 110 112* 111 109  CO2 17* 18* 17* 17*  GLUCOSE 155* 95 92 90  BUN 35* 43* 47* 46*  CREATININE 2.24* 2.51* 2.56* 2.24*  CALCIUM 8.8* 8.1* 8.1* 8.1*  MG  --   --   --  1.6*  PHOS  --   --   --  3.3     GFR: Estimated Creatinine Clearance: 19.2 mL/min (A) (by C-G formula based on SCr of 2.24 mg/dL (H)).  Liver Function Tests: Recent Labs  Lab 03/14/22 1301 03/15/22 0427 03/16/22 0512 03/17/22 0423  AST 88* 59* 64* 76*  ALT 32 '25 26 28  '$ ALKPHOS 109 79 100 106  BILITOT 2.9* 2.0* 1.5* 1.4*  PROT 6.3* 4.9* 5.3* 5.3*  ALBUMIN 2.8* 1.9* 2.3* 2.0*     CBG: Recent Labs  Lab 03/16/22 1223 03/16/22 1616 03/16/22 2128 03/17/22 0749 03/17/22 1155  GLUCAP 94 108* 138* 80 88      Recent Results (from the past 240 hour(s))  Resp panel by RT-PCR (RSV, Flu A&B, Covid) Anterior Nasal Swab     Status: Abnormal   Collection Time: 03/14/22 11:12 AM   Specimen: Anterior Nasal Swab  Result Value Ref Range Status   SARS Coronavirus 2 by RT PCR NEGATIVE NEGATIVE Final    Comment: (NOTE) SARS-CoV-2 target nucleic acids are NOT DETECTED.  The SARS-CoV-2 RNA is generally detectable in upper respiratory specimens during the acute phase of infection. The lowest concentration of SARS-CoV-2 viral copies this assay can detect is 138 copies/mL. A negative result does not preclude SARS-Cov-2 infection and should not be used as the sole basis for  treatment or other patient management decisions. A negative result may occur with  improper specimen collection/handling, submission of specimen other than nasopharyngeal swab, presence of viral mutation(s) within the areas targeted by this assay, and inadequate number of viral copies(<138 copies/mL). A negative result must be combined with clinical observations, patient history, and epidemiological information. The expected result is Negative.  Fact Sheet for Patients:  EntrepreneurPulse.com.au  Fact Sheet for Healthcare Providers:  IncredibleEmployment.be  This test is no t yet approved or cleared by the [place] FDA and  has been authorized for detection and/or diagnosis of SARS-CoV-2 by FDA under an Emergency Use Authorization (EUA). This EUA will remain  in effect (meaning this test can be used) for the duration of the COVID-19 declaration under Section 564(b)(1) of the Act, 21 U.S.C.section 360bbb-3(b)(1), unless the authorization is terminated  or revoked sooner.  Influenza A by PCR POSITIVE (A) NEGATIVE Final   Influenza B by PCR NEGATIVE NEGATIVE Final    Comment: (NOTE) The Xpert Xpress SARS-CoV-2/FLU/RSV plus assay is intended as an aid in the diagnosis of influenza from Nasopharyngeal swab specimens and should not be used as a sole basis for treatment. Nasal washings and aspirates are unacceptable for Xpert Xpress SARS-CoV-2/FLU/RSV testing.  Fact Sheet for Patients: EntrepreneurPulse.com.au  Fact Sheet for Healthcare Providers: IncredibleEmployment.be  This test is not yet approved or cleared by the [place] FDA and has been authorized for detection and/or diagnosis of SARS-CoV-2 by FDA under an Emergency Use Authorization (EUA). This EUA will remain in effect (meaning this test can be used) for the duration of the COVID-19 declaration under Section 564(b)(1) of the Act, 21  U.S.C. section 360bbb-3(b)(1), unless the authorization is terminated or revoked.     Resp Syncytial Virus by PCR NEGATIVE NEGATIVE Final    Comment: (NOTE) Fact Sheet for Patients: EntrepreneurPulse.com.au  Fact Sheet for Healthcare Providers: IncredibleEmployment.be  This test is not yet approved or cleared by the [place] FDA and has been authorized for detection and/or diagnosis of SARS-CoV-2 by FDA under an Emergency Use Authorization (EUA). This EUA will remain in effect (meaning this test can be used) for the duration of the COVID-19 declaration under Section 564(b)(1) of the Act, 21 U.S.C. section 360bbb-3(b)(1), unless the authorization is terminated or revoked.  Performed at [facility], [place] [address] [place], [place] [address]   Culture, blood (Routine X 2) w Reflex to ID Panel     Status: None (Preliminary result)   Collection Time: 03/14/22  5:20 PM   Specimen: BLOOD  Result Value Ref Range Status   Specimen Description   Final    BLOOD LEFT ANTECUBITAL Performed at [facility] [address], [place], [place] [address]    Special Requests   Final    BOTTLES DRAWN AEROBIC AND ANAEROBIC Blood Culture adequate volume Performed at [facility] [address] [place], [place] [address]    Culture   Final    NO GROWTH 3 DAYS Performed at [facility], [place] [address], [place], [place] [address]    Report Status PENDING  Incomplete  Culture, blood (Routine X 2) w Reflex to ID Panel     Status: None (Preliminary result)   Collection Time: 03/14/22 10:56 PM   Specimen: BLOOD  Result Value Ref Range Status   Specimen Description   Final    BLOOD BLOOD LEFT ARM Performed at [facility] [address], [place], [place] [address]    Special Requests   Final    BOTTLES DRAWN AEROBIC ONLY Blood Culture adequate volume Performed at [facility] [address], [place], [place] [address]    Culture   Final    NO GROWTH 2 DAYS Performed at [facility] [address] [place], [place] [address]    Report Status PENDING  Incomplete  Urine Culture     Status: None   Collection Time: 03/15/22  7:53 AM   Specimen: Urine, Clean Catch  Result Value Ref Range Status   Specimen Description   Final    URINE, CLEAN CATCH Performed at [facility], [place] [address] [place], [place] [address]    Special Requests   Final    NONE Performed at [facility], [place] [address], [place],  [address]  Culture   Final    NO GROWTH Performed at [facility], [place] [address], [place], [place] [address]    Report Status 03/16/2022 FINAL  Final         Radiology Studies: No results found.      Scheduled Meds:  acetaminophen  1,000 mg Oral Q6H   calcium carbonate  1,250 mg Oral Q breakfast   cholecalciferol  1,000 Units Oral Daily   cyanocobalamin  1,000 mcg Oral Daily   fluticasone  2 spray Each Nare Daily   folic acid  1 mg Oral Daily   guaiFENesin  1,200 mg Oral BID   insulin aspart  0-6 Units Subcutaneous TID WC   ipratropium-albuterol  3 mL Nebulization BID   letrozole  2.5 mg Oral Daily   levothyroxine  112 mcg Oral Q0600   loratadine  10 mg Oral Daily   methocarbamol  1,000 mg Oral TID   oseltamivir  30 mg Oral Daily   pantoprazole  40 mg Oral Daily   sodium bicarbonate  650 mg Oral BID   Continuous Infusions:  azithromycin 500 mg (03/16/22 1730)   cefTRIAXone (ROCEPHIN)  IV 2 g (03/17/22 1046)   lactated ringers 125 mL/hr at 03/17/22 0606     LOS: 3 days    Time spent: 40 minutes    Irine Seal, MD [facility]   To contact the attending provider between 7A-7P or the covering provider during after hours 7P-7A, please log into the web site [email or web address] and access using universal [place] password for that web  site. If you do not have the password, please call the hospital operator.  03/17/2022, 1:29 PM

## 2022-03-18 DIAGNOSIS — N179 Acute kidney failure, unspecified: Secondary | ICD-10-CM | POA: Diagnosis not present

## 2022-03-18 DIAGNOSIS — W19XXXD Unspecified fall, subsequent encounter: Secondary | ICD-10-CM | POA: Diagnosis not present

## 2022-03-18 DIAGNOSIS — S065XAA Traumatic subdural hemorrhage with loss of consciousness status unknown, initial encounter: Secondary | ICD-10-CM | POA: Diagnosis not present

## 2022-03-18 DIAGNOSIS — J189 Pneumonia, unspecified organism: Secondary | ICD-10-CM

## 2022-03-18 DIAGNOSIS — S2243XA Multiple fractures of ribs, bilateral, initial encounter for closed fracture: Secondary | ICD-10-CM | POA: Diagnosis not present

## 2022-03-18 LAB — CBC WITH DIFFERENTIAL/PLATELET
Abs Immature Granulocytes: 0.33 10*3/uL — ABNORMAL HIGH (ref 0.00–0.07)
Basophils Absolute: 0 10*3/uL (ref 0.0–0.1)
Basophils Relative: 1 %
Eosinophils Absolute: 0.1 10*3/uL (ref 0.0–0.5)
Eosinophils Relative: 3 %
HCT: 27 % — ABNORMAL LOW (ref 36.0–46.0)
Hemoglobin: 8.8 g/dL — ABNORMAL LOW (ref 12.0–15.0)
Immature Granulocytes: 9 %
Lymphocytes Relative: 15 %
Lymphs Abs: 0.6 10*3/uL — ABNORMAL LOW (ref 0.7–4.0)
MCH: 33 pg (ref 26.0–34.0)
MCHC: 32.6 g/dL (ref 30.0–36.0)
MCV: 101.1 fL — ABNORMAL HIGH (ref 80.0–100.0)
Monocytes Absolute: 0.4 10*3/uL (ref 0.1–1.0)
Monocytes Relative: 9 %
Neutro Abs: 2.3 10*3/uL (ref 1.7–7.7)
Neutrophils Relative %: 63 %
Platelets: 77 10*3/uL — ABNORMAL LOW (ref 150–400)
RBC: 2.67 MIL/uL — ABNORMAL LOW (ref 3.87–5.11)
RDW: 15.9 % — ABNORMAL HIGH (ref 11.5–15.5)
WBC: 3.7 10*3/uL — ABNORMAL LOW (ref 4.0–10.5)
nRBC: 0.5 % — ABNORMAL HIGH (ref 0.0–0.2)

## 2022-03-18 LAB — BASIC METABOLIC PANEL
Anion gap: 8 (ref 5–15)
BUN: 42 mg/dL — ABNORMAL HIGH (ref 8–23)
CO2: 18 mmol/L — ABNORMAL LOW (ref 22–32)
Calcium: 8.1 mg/dL — ABNORMAL LOW (ref 8.9–10.3)
Chloride: 108 mmol/L (ref 98–111)
Creatinine, Ser: 2.17 mg/dL — ABNORMAL HIGH (ref 0.44–1.00)
GFR, Estimated: 22 mL/min — ABNORMAL LOW (ref >60)
Glucose, Bld: 83 mg/dL (ref 70–99)
Potassium: 4 mmol/L (ref 3.5–5.1)
Sodium: 134 mmol/L — ABNORMAL LOW (ref 135–145)

## 2022-03-18 LAB — MAGNESIUM: Magnesium: 2.5 mg/dL — ABNORMAL HIGH (ref 1.7–2.4)

## 2022-03-18 MED ORDER — SENNOSIDES-DOCUSATE SODIUM 8.6-50 MG PO TABS
1.0000 | ORAL_TABLET | Freq: Two times a day (BID) | ORAL | Status: DC
  Administered 2022-03-18 – 2022-03-23 (×8): 1 via ORAL
  Filled 2022-03-18 (×10): qty 1

## 2022-03-18 NOTE — Progress Notes (Signed)
Physical Therapy Treatment Patient Details Name: Catherine Decker MRN: [id number] DOB: Apr 29, 1940 Today's Date: 03/18/2022   History of Present Illness Patient is a 82 year old female who presented after a fall down some stairs. Patient was found to have left rib fractures 4-5, right rib fractures 7-8, T7 compression fx, T1 spinous process fx, SDH.  Pt also found to have influenza A. GYJ:EHUDJ breast cancer, arthritis, hyperlipidemia, splenic artery aneurysm    PT Comments    Progressing with mobility. Pt reports dizziness with mobility on today. O2 93% on RA. Continue to recommend ST SNF rehab.    Recommendations for follow up therapy are one component of a multi-disciplinary discharge planning process, led by the attending physician.  Recommendations may be updated based on patient status, additional functional criteria and insurance authorization.  Follow Up Recommendations  Skilled nursing-short term rehab (<3 hours/day) Can patient physically be transported by private vehicle: Yes   Assistance Recommended at Discharge Intermittent Supervision/Assistance  Patient can return home with the following Assistance with cooking/housework;Assist for transportation;Help with stairs or ramp for entrance   Equipment Recommendations  None recommended by PT    Recommendations for Other Services       Precautions / Restrictions Precautions Precautions: Fall Precaution Comments: rib fractures, back preacutions Restrictions Weight Bearing Restrictions: No     Mobility  Bed Mobility Overal bed mobility: Needs Assistance Bed Mobility: Supine to Sit     Supine to sit: Supervision, HOB elevated     General bed mobility comments: with increased time. Pt reports some lightheadedness    Transfers Overall transfer level: Needs assistance Equipment used: Rolling walker (2 wheels) Transfers: Sit to/from Stand Sit to Stand: Min guard           General transfer comment: Min guard for  safety. Cues for safety, hand placement.    Ambulation/Gait Ambulation/Gait assistance: Min assist Gait Distance (Feet): 15 Feet Assistive device: Rolling walker (2 wheels) Gait Pattern/deviations: Step-through pattern       General Gait Details: Intermittent assist to steady 2* dizziness. Distance limited by dizziness. O2 93% on RA. Fatigues fairly quickly.   Stairs             Wheelchair Mobility    Modified Rankin (Stroke Patients Only)       Balance Overall balance assessment: Needs assistance, History of Falls         Standing balance support: Bilateral upper extremity supported, Reliant on assistive device for balance, During functional activity Standing balance-Leahy Scale: Poor                              Cognition Arousal/Alertness: Awake/alert Behavior During Therapy: WFL for tasks assessed/performed Overall Cognitive Status: Within Functional Limits for tasks assessed                                          Exercises      General Comments        Pertinent Vitals/Pain Pain Assessment Pain Assessment: No/denies pain    Home Living                          Prior Function            PT Goals (current goals can now be found in the care plan section)  Progress towards PT goals: Progressing toward goals    Frequency    Min 3X/week      PT Plan Current plan remains appropriate    Co-evaluation              AM-PAC PT "6 Clicks" Mobility   Outcome Measure  Help needed turning from your back to your side while in a flat bed without using bedrails?: A Little Help needed moving from lying on your back to sitting on the side of a flat bed without using bedrails?: A Little Help needed moving to and from a bed to a chair (including a wheelchair)?: A Little Help needed standing up from a chair using your arms (e.g., wheelchair or bedside chair)?: A Little Help needed to walk in hospital  room?: A Little Help needed climbing 3-5 steps with a railing? : A Lot 6 Click Score: 17    End of Session Equipment Utilized During Treatment: Gait belt Activity Tolerance: Patient limited by fatigue;Patient tolerated treatment well Patient left: in chair;with call bell/phone within reach;with chair alarm set   PT Visit Diagnosis: Difficulty in walking, not elsewhere classified (R26.2);Unsteadiness on feet (R26.81)     Time: 2595-6387 PT Time Calculation (min) (ACUTE ONLY): 21 min  Charges:  $Gait Training: 8-22 mins                         Doreatha Massed, PT Acute Rehabilitation  Office: [phone]

## 2022-03-18 NOTE — TOC Progression Note (Addendum)
Transition of Care [facility]) - Progression Note    Patient Details  Name: Catherine Decker MRN: [id number] Date of Birth: [birth year]  [facility]) CM/SW [place], LCSW Phone Number: 03/18/2022, 10:52 AM  Clinical Narrative:    CSW spoke with daughter and presented bed offers. Pt's daughter requested time to review. TOC to follow.   ADDEN 3:00pm Pt's daughter reported she would like her mother to d/c to [facility]. Facility will start insurance auth. TOC to follow.    3:50pm Pt's authorization was approved 8/10, certification # [id number]. CSW spoke with [facility] who reported they do not have a bed until Friday. Noted MD's EDD is 03/20/22, SNF bed will not be ready until 03/21/22.TOC to follow.     Expected Discharge Plan: [place] Barriers to Discharge: Continued Medical Work up  Expected Discharge Plan and Services       Living arrangements for the past 2 months: Single Family Home                                       Social Determinants of Health (SDOH) Interventions SDOH Screenings   Food Insecurity: No Food Insecurity (03/14/2022)  Housing: Low Risk  (03/14/2022)  Transportation Needs: No Transportation Needs (03/14/2022)  Utilities: Not At Risk (03/14/2022)  Tobacco Use: Low Risk  (03/14/2022)    Readmission Risk Interventions     No data to display

## 2022-03-18 NOTE — Progress Notes (Addendum)
PROGRESS NOTE    Catherine Decker  PYK:[id number] DOB: 20-Sep-1940 DOA: 03/14/2022 PCP: Antony Contras, MD    Chief Complaint  Patient presents with   Fall   Diarrhea   Weakness   Cough    Brief Narrative:  Patient 82 year old female history of hypothyroidism, hypertension presented to the ED after she fell down the stairs 2 days prior to admission slipped while walking hit her head on the way down but did not suffer any LOC.  Patient was able to get up with help from a friend however decided to stay home.  Patient's daughter noted that patient with more confusion and fatigue over the past 2 days prior to admission with decreased appetite and a 1-1/2-week history of cough and congestion and subsequently brought to the ED as patient with no clinical improvement.  Patient seen in the ED, workup done concerning for subdural hematoma, chronic hygromas, multiple rib fractures, pneumonia, influenza A, AKI on CKD stage IIIb.  Neurosurgery reviewed films and felt no further workup needed intervention at this time.  Trauma surgery also assessed the patient and recommended pain management, pulmonary toileting.  Patient admitted placed on empiric IV antibiotics and Tamiflu.   Assessment & Plan:   Principal Problem:   Falls Active Problems:   Malignant neoplasm of upper-outer quadrant of right breast in female, estrogen receptor positive ([place])   DM2 (diabetes mellitus, type 2) ([place])   Multiple rib fractures   Closed fracture of spinous process of thoracic vertebra (HCC)   Hygroma   Subdural hematoma (HCC)   CAP (community acquired pneumonia)   Influenza A   AKI (acute kidney injury) ([place])   Stage 3b chronic kidney disease (CKD) (HCC)   HTN (hypertension)   HLD (hyperlipidemia)   Hypothyroidism   Lactic acidosis   Macrocytic anemia   Thrombocytopenia (HCC)   Pancytopenia (HCC)   SDH (subdural hematoma) (HCC)   Pressure injury of skin  #1 fall/multiple rib fractures/spinous process  fracture of T1/subdural hematoma/chronic hygromas -Secondary to mechanical fall. -Patient assessed by the trauma team who reviewed imaging and recommended pain management, pulmonary toileting, incentive spirometry use secondary to rib fractures to prevent worsening pneumonia and no further intervention at this time. -Continue incentive spirometry, flutter valve, treatment of pneumonia, pain management. -Continue Robaxin, scheduled Tylenol. -Films reviewed by neurosurgery who feel no intervention needed at this time. -PT/OT.  2.  Influenza A -Tamiflu.  3.  Right-sided pneumonia -Noted on CT chest. -Patient with fall with rib fractures, likely can take deep inspiratory effort. -Urine strep pneumococcus antigen positive. -Urine Legionella antigen pending. -Continue IV Rocephin to complete a 5-day course of treatment. -Discontinued IV azithromycin. -Continue scheduled DuoNebs, Flonase, Claritin, PPI, Mucinex.  4.  Hypothyroidism -Continue home regimen Synthroid.   5.  AKI on CKD stage IIIb -Likely secondary to volume depletion, dehydration in the setting of ARB. -Renal ultrasound negative for hydronephrosis. -Urinalysis with concerns for UTI. -Urine sodium of 13, urine creatinine 195. -Creatinine initially with a slight bump however trending back down currently at 2.17 from 2.24 from 2.56 from 2.51 from 2.24 on admission. -Continue IV fluids at 75 cc an hour.   -Continue to hold ARB, avoid nephrotoxins.   -Monitor urine output.   -Likely will not resume ARB on discharge. -Repeat labs in the AM.  6.  UTI -Urinalysis concerning for UTI. -Urine cultures negative.  -Patient empirically on IV Rocephin secondary to right-sided pneumonia.  7.  Hyperlipidemia -Continue to hold home regimen.  8.  History  of breast cancer status postmastectomy/spinal lesions -Imaging with T1 a right scapular lesion concerning for mets. -Patient's primary oncologist, Dr. Lorenso Courier has been notified via  epic of patient's admission. -Continue home regimen letrozole.  -Outpatient follow-up with oncology.    9.  Transaminitis -Likely secondary to history of cirrhosis. -Right upper quadrant ultrasound consistent with cirrhosis with portal hypertension, low-density structure in the left lobe of the liver highlighted on prior noncontrast CTs up oropharynx. -LFTs trending down from admission and close to baseline. -Outpatient follow-up.  10.  History of cirrhosis -Outpatient follow-up with primary hepatologist.  11.  Pancytopenia -Patient with a pancytopenia being followed by hematology/oncology in the outpatient setting. -Worsening thrombocytopenia and now patient with a leukopenia unchanged from prior leukopenia likely due to acute infection. -Thrombocytopenia and leukopenia improving daily. -Patient with no bleeding. -Hematology/oncology informed of admission via epic. -Follow.  12.  Lactic acidosis -Likely secondary to acute infection of pneumonia and influenza A. -Lactic acid levels trending down. -Continue IV fluids, IV antibiotics. -Repeat labs in the AM.  13.  Diabetes mellitus type 2 -Hemoglobin A1c 5.0 (11/05/2020). -CBG 83 this morning. -Hemoglobin A1c 4.9.   -Continue to hold oral hypoglycemic agents.  -Discontinued SSI, CBGs.  -Outpatient follow-up.   14.  Hypertension -BP stable and somewhat soft.. -Continue to hold home regimen antihypertensive medications.   -IV fluids.    15.  History of breast cancer -Continue home regimen letrozole.    -Oncology notified via epic of admission.  16.  Pressure injury, stage I medial buttocks, POA Pressure Injury 03/14/22 Buttocks Medial;Mid Stage 1 -  Intact skin with non-blanchable redness of a localized area usually over a bony prominence. (Active)  03/14/22 2200  Location: Buttocks  Location Orientation: Medial;Mid  Staging: Stage 1 -  Intact skin with non-blanchable redness of a localized area usually over a bony  prominence.  Wound Description (Comments):   Present on Admission: Yes       DVT prophylaxis: SCDs Code Status: DNR Family Communication: Updated patient.  No family at bedside. Disposition: Likely home with home health versus SNF once renal function has improved and close to baseline..  Status is: Inpatient Remains inpatient appropriate because: Severity of illness.   Consultants:  Oncology notified of admission via epic. General surgery/trauma team: Dr.Lovick 03/14/2022  Procedures:  CT head CT C-spine 03/14/2022 Chest x-ray 03/14/2022 Renal ultrasound 03/14/2022 Right upper quadrant ultrasound 03/15/2022 CT chest abdomen and pelvis 03/14/2022  Antimicrobials:  IV azithromycin 03/14/2022>>>>> 03/17/2022 IV Rocephin 03/14/2022>>>>>> 03/20/2022   Subjective: Started to feel better overall.  States he is losing her voice.  No chest pain.  Good urine output.  Pain currently controlled.  Patient states she will be going to her friend's home when medically stable.  Objective: Vitals:   03/17/22 1350 03/17/22 2049 03/18/22 0543 03/18/22 0826  BP: 106/80  110/67   Pulse: 86  84   Resp: 18  20   Temp: 97.6 F (36.4 C)  98.7 F (37.1 C)   TempSrc:   Oral   SpO2: 96% 94%  96%  Weight:   71.9 kg   Height:        Intake/Output Summary (Last 24 hours) at 03/18/2022 1247 Last data filed at 03/17/2022 2302 Gross per 24 hour  Intake 2487.69 ml  Output --  Net 2487.69 ml    Filed Weights   03/16/22 0500 03/17/22 0500 03/18/22 0543  Weight: 73.9 kg 73.2 kg 71.9 kg    Examination:  General exam: NAD Respiratory  system: Improved coarse breath sounds on the right.  No wheezing.  No crackles.  Fair air movement.  Speaking in full sentences.  Cardiovascular system: Regular rate rhythm no murmurs rubs or gallops.  No JVD.  No lower extremity edema. Gastrointestinal system: Abdomen is soft, nontender, nondistended, positive bowel sounds.  No rebound.  No guarding.  Central nervous system: Alert  and oriented.  Moving extremities spontaneously.  No focal neurological deficits.   Extremities: Symmetric 5 x 5 power. Skin: Multiple ecchymotic areas noted on left flank, left-sided chest wall, left shoulder.  Psychiatry: Judgement and insight appear normal. Mood & affect appropriate.     Data Reviewed: I have personally reviewed following labs and imaging studies  CBC: Recent Labs  Lab 03/14/22 1301 03/15/22 0427 03/16/22 0512 03/17/22 0423 03/18/22 0443  WBC 7.1 2.6* 2.9* 3.5* 3.7*  NEUTROABS  --   --  2.2 2.6 2.3  HGB 9.5* 7.5* 8.1* 8.7* 8.8*  HCT 29.3* 22.5* 25.1* 27.7* 27.0*  MCV 100.7* 100.0 101.2* 103.7* 101.1*  PLT 59* 40* 50* 59* 77*     Basic Metabolic Panel: Recent Labs  Lab 03/14/22 1301 03/15/22 0427 03/16/22 0512 03/17/22 0423 03/18/22 0443  NA 136 138 136 134* 134*  K 4.3 3.9 3.9 3.8 4.0  CL 110 112* 111 109 108  CO2 17* 18* 17* 17* 18*  GLUCOSE 155* 95 92 90 83  BUN 35* 43* 47* 46* 42*  CREATININE 2.24* 2.51* 2.56* 2.24* 2.17*  CALCIUM 8.8* 8.1* 8.1* 8.1* 8.1*  MG  --   --   --  1.6* 2.5*  PHOS  --   --   --  3.3  --      GFR: Estimated Creatinine Clearance: 19.7 mL/min (A) (by C-G formula based on SCr of 2.17 mg/dL (H)).  Liver Function Tests: Recent Labs  Lab 03/14/22 1301 03/15/22 0427 03/16/22 0512 03/17/22 0423  AST 88* 59* 64* 76*  ALT 32 '25 26 28  '$ ALKPHOS 109 79 100 106  BILITOT 2.9* 2.0* 1.5* 1.4*  PROT 6.3* 4.9* 5.3* 5.3*  ALBUMIN 2.8* 1.9* 2.3* 2.0*     CBG: Recent Labs  Lab 03/16/22 2128 03/17/22 0749 03/17/22 1155 03/17/22 1638 03/17/22 2120  GLUCAP 138* 80 88 98 86      Recent Results (from the past 240 hour(s))  Resp panel by RT-PCR (RSV, Flu A&B, Covid) Anterior Nasal Swab     Status: Abnormal   Collection Time: 03/14/22 11:12 AM   Specimen: Anterior Nasal Swab  Result Value Ref Range Status   SARS Coronavirus 2 by RT PCR NEGATIVE NEGATIVE Final    Comment: (NOTE) SARS-CoV-2 target nucleic acids are  NOT DETECTED.  The SARS-CoV-2 RNA is generally detectable in upper respiratory specimens during the acute phase of infection. The lowest concentration of SARS-CoV-2 viral copies this assay can detect is 138 copies/mL. A negative result does not preclude SARS-Cov-2 infection and should not be used as the sole basis for treatment or other patient management decisions. A negative result may occur with  improper specimen collection/handling, submission of specimen other than nasopharyngeal swab, presence of viral mutation(s) within the areas targeted by this assay, and inadequate number of viral copies(<138 copies/mL). A negative result must be combined with clinical observations, patient history, and epidemiological information. The expected result is Negative.  Fact Sheet for Patients:  EntrepreneurPulse.com.au  Fact Sheet for Healthcare Providers:  IncredibleEmployment.be  This test is no t yet approved or cleared by the [place] FDA  and  has been authorized for detection and/or diagnosis of SARS-CoV-2 by FDA under an Emergency Use Authorization (EUA). This EUA will remain  in effect (meaning this test can be used) for the duration of the COVID-19 declaration under Section 564(b)(1) of the Act, 21 U.S.C.section 360bbb-3(b)(1), unless the authorization is terminated  or revoked sooner.       Influenza A by PCR POSITIVE (A) NEGATIVE Final   Influenza B by PCR NEGATIVE NEGATIVE Final    Comment: (NOTE) The Xpert Xpress SARS-CoV-2/FLU/RSV plus assay is intended as an aid in the diagnosis of influenza from Nasopharyngeal swab specimens and should not be used as a sole basis for treatment. Nasal washings and aspirates are unacceptable for Xpert Xpress SARS-CoV-2/FLU/RSV testing.  Fact Sheet for Patients: EntrepreneurPulse.com.au  Fact Sheet for Healthcare Providers: IncredibleEmployment.be  This test is  not yet approved or cleared by the [place] FDA and has been authorized for detection and/or diagnosis of SARS-CoV-2 by FDA under an Emergency Use Authorization (EUA). This EUA will remain in effect (meaning this test can be used) for the duration of the COVID-19 declaration under Section 564(b)(1) of the Act, 21 U.S.C. section 360bbb-3(b)(1), unless the authorization is terminated or revoked.     Resp Syncytial Virus by PCR NEGATIVE NEGATIVE Final    Comment: (NOTE) Fact Sheet for Patients: EntrepreneurPulse.com.au  Fact Sheet for Healthcare Providers: IncredibleEmployment.be  This test is not yet approved or cleared by the [place] FDA and has been authorized for detection and/or diagnosis of SARS-CoV-2 by FDA under an Emergency Use Authorization (EUA). This EUA will remain in effect (meaning this test can be used) for the duration of the COVID-19 declaration under Section 564(b)(1) of the Act, 21 U.S.C. section 360bbb-3(b)(1), unless the authorization is terminated or revoked.  Performed at [facility], [place] [address] [place], [place] [address]   Culture, blood (Routine X 2) w Reflex to ID Panel     Status: None (Preliminary result)   Collection Time: 03/14/22  5:20 PM   Specimen: BLOOD  Result Value Ref Range Status   Specimen Description   Final    BLOOD LEFT ANTECUBITAL Performed at [facility] [address], [place], [place] [address]    Special Requests   Final    BOTTLES DRAWN AEROBIC AND ANAEROBIC Blood Culture adequate volume Performed at [facility] [address] [place], [place] [address]    Culture   Final    NO GROWTH 4 DAYS Performed at [facility], [place] [address] [place], [place] [address]    Report Status PENDING  Incomplete  Culture, blood (Routine X 2) w Reflex to ID Panel     Status: None (Preliminary result)   Collection Time:  03/14/22 10:56 PM   Specimen: BLOOD  Result Value Ref Range Status   Specimen Description   Final    BLOOD BLOOD LEFT ARM Performed at [facility] [address], [place], [place] [address]    Special Requests   Final    BOTTLES DRAWN AEROBIC ONLY Blood Culture adequate volume Performed at [facility] [address] [place], [place] [address]    Culture   Final    NO GROWTH 3 DAYS Performed at [facility], [place] [address], [place],  [address]    Report Status PENDING  Incomplete  Urine Culture     Status: None   Collection Time: 03/15/22  7:53 AM  Specimen: Urine, Clean Catch  Result Value Ref Range Status   Specimen Description   Final    URINE, CLEAN CATCH Performed at [facility], [place] [address] [place], [place] [address]    Special Requests   Final    NONE Performed at [facility], [place] [address], [place], [place] [address]    Culture   Final    NO GROWTH Performed at [facility], [place] [address] [place], [place] [address]    Report Status 03/16/2022 FINAL  Final         Radiology Studies: No results found.      Scheduled Meds:  acetaminophen  1,000 mg Oral Q6H   calcium carbonate  1,250 mg Oral Q breakfast   cholecalciferol  1,000 Units Oral Daily   cyanocobalamin  1,000 mcg Oral Daily   fluticasone  2 spray Each Nare Daily   folic acid  1 mg Oral Daily   guaiFENesin  1,200 mg Oral BID   ipratropium-albuterol  3 mL Nebulization BID   letrozole  2.5 mg Oral Daily   levothyroxine  112 mcg Oral Q0600   loratadine  10 mg Oral Daily   methocarbamol  1,000 mg Oral TID   oseltamivir  30 mg Oral Daily   pantoprazole  40 mg Oral Daily   senna-docusate  1 tablet Oral BID   sodium bicarbonate  650 mg Oral BID   Continuous Infusions:  cefTRIAXone (ROCEPHIN)  IV 2 g (03/18/22 1125)   lactated ringers 75 mL/hr at 03/17/22 2124     LOS: 4 days    Time  spent: 40 minutes    Irine Seal, MD [facility]   To contact the attending provider between 7A-7P or the covering provider during after hours 7P-7A, please log into the web site [email or web address] and access using universal [place] password for that web site. If you do not have the password, please call the hospital operator.  03/18/2022, 12:47 PM

## 2022-03-18 NOTE — [place] FL2 (Signed)
[place] MEDICAID FL2 LEVEL OF CARE FORM     IDENTIFICATION  Patient Name: Catherine Decker Birthdate: 12-10-1940 Sex: female Admission Date (Current Location): 03/14/2022  [facility] and [place] Number:  Herbalist and Address:  [facility],  [place], [place]      Provider Number: [id number]  Attending Physician Name and Address:  Eugenie Filler, MD  Relative Name and Phone Number:  [name] (Daughter) [phone] [facility])    Current Level of Care: Hospital Recommended Level of Care: [place] Prior Approval Number:    Date Approved/Denied:   PASRR Number: [id number]  Discharge Plan: SNF    Current Diagnoses: Patient Active Problem List   Diagnosis Date Noted   Pancytopenia ([place]) 03/15/2022   SDH (subdural hematoma) ([place]) 03/15/2022   Pressure injury of skin 03/15/2022   Falls 03/14/2022   Multiple rib fractures 03/14/2022   Closed fracture of spinous process of thoracic vertebra ([place]) 03/14/2022   Hygroma 03/14/2022   Subdural hematoma ([place]) 03/14/2022   CAP (community acquired pneumonia) 03/14/2022   Influenza A 03/14/2022   AKI (acute kidney injury) ([place]) 03/14/2022   Stage 3b chronic kidney disease (CKD) ([place]) 03/14/2022   HTN (hypertension) 03/14/2022   HLD (hyperlipidemia) 03/14/2022   Hypothyroidism 03/14/2022   Lactic acidosis 03/14/2022   Macrocytic anemia 03/14/2022   Thrombocytopenia ([place]) 03/14/2022   DM2 (diabetes mellitus, type 2) ([place]) 11/05/2020   Shortness of breath 08/28/2020   Osteoporosis 11/04/2019   Cirrhosis ([place])    Breast cancer of upper-outer quadrant of right female breast ([place]) 09/07/2019   Malignant neoplasm of upper-outer quadrant of right breast in female, estrogen receptor positive ([place]) 07/06/2019   Aneurysm of splenic artery ([place]) 06/05/2011    Orientation RESPIRATION BLADDER Height & Weight     Self, Time, Situation, Place  Normal Continent Weight: 158  lb 8.2 oz (71.9 kg) Height:  '5\' 3"'$  (160 cm)  BEHAVIORAL SYMPTOMS/MOOD NEUROLOGICAL BOWEL NUTRITION STATUS      Continent Diet (Carb mod)  AMBULATORY STATUS COMMUNICATION OF NEEDS Skin   Limited Assist Verbally Normal                       Personal Care Assistance Level of Assistance  Bathing, Dressing, Feeding Bathing Assistance: Limited assistance Feeding assistance: Independent Dressing Assistance: Limited assistance     Functional Limitations Info  Sight, Hearing, Speech Sight Info: Adequate Hearing Info: Adequate Speech Info: Adequate    SPECIAL CARE FACTORS FREQUENCY  PT (By licensed PT), OT (By licensed OT)     PT Frequency: 5 x a week OT Frequency: 5 x a week            Contractures Contractures Info: Not present    Additional Factors Info  Code Status, Allergies Code Status Info: DNR Allergies Info: Demerol,Meperidine,Meperidine Hcl           Current Medications (03/18/2022):  This is the current hospital active medication list Current Facility-Administered Medications  Medication Dose Route Frequency Provider Last Rate Last Admin   acetaminophen (TYLENOL) tablet 1,000 mg  1,000 mg Oral Q6H Kyle, Tyrone A, DO   1,000 mg at 03/18/22 0533   calcium carbonate (OS-CAL - dosed in mg of elemental calcium) tablet 1,250 mg  1,250 mg Oral Q breakfast Marylyn Ishihara, Tyrone A, DO   1,250 mg at 03/17/22 1030   cefTRIAXone (ROCEPHIN) 2 g in sodium chloride 0.9 % 100 mL IVPB  2 g Intravenous Q24H  Cherylann Ratel A, DO 200 mL/hr at 03/17/22 1046 2 g at 03/17/22 1046   cholecalciferol (VITAMIN D3) 25 MCG (1000 UNIT) tablet 1,000 Units  1,000 Units Oral Daily Kyle, Tyrone A, DO   1,000 Units at 03/17/22 1030   cyanocobalamin (VITAMIN B12) tablet 1,000 mcg  1,000 mcg Oral Daily Kyle, Tyrone A, DO   1,000 mcg at 03/17/22 1029   fluticasone (FLONASE) 50 MCG/ACT nasal spray 2 spray  2 spray Each Nare Daily Eugenie Filler, MD   2 spray at 43/32/95 1884   folic acid (FOLVITE) tablet 1  mg  1 mg Oral Daily Kyle, Tyrone A, DO   1 mg at 03/17/22 1029   guaiFENesin (MUCINEX) 12 hr tablet 1,200 mg  1,200 mg Oral BID Eugenie Filler, MD   1,200 mg at 03/17/22 2120   HYDROcodone bit-homatropine (HYCODAN) 5-1.5 MG/5ML syrup 5 mL  5 mL Oral Q6H PRN Eugenie Filler, MD   5 mL at 03/18/22 0533   ipratropium-albuterol (DUONEB) 0.5-2.5 (3) MG/3ML nebulizer solution 3 mL  3 mL Nebulization Q4H PRN Marylyn Ishihara, Tyrone A, DO       ipratropium-albuterol (DUONEB) 0.5-2.5 (3) MG/3ML nebulizer solution 3 mL  3 mL Nebulization BID Eugenie Filler, MD   3 mL at 03/18/22 0825   lactated ringers infusion   Intravenous Continuous Eugenie Filler, MD 75 mL/hr at 03/17/22 2124 New Bag at 03/17/22 2124   letrozole [facility]) tablet 2.5 mg  2.5 mg Oral Daily Eugenie Filler, MD   2.5 mg at 03/17/22 1043   levothyroxine (SYNTHROID) tablet 112 mcg  112 mcg Oral Q0600 Cherylann Ratel A, DO   112 mcg at 03/18/22 0533   loratadine (CLARITIN) tablet 10 mg  10 mg Oral Daily Eugenie Filler, MD   10 mg at 03/17/22 1029   methocarbamol (ROBAXIN) tablet 1,000 mg  1,000 mg Oral TID Cherylann Ratel A, DO   1,000 mg at 03/17/22 2120   morphine (PF) 2 MG/ML injection 1-2 mg  1-2 mg Intravenous Q4H PRN Marylyn Ishihara, Tyrone A, DO       oseltamivir (TAMIFLU) capsule 30 mg  30 mg Oral Daily Eugenie Filler, MD   30 mg at 03/17/22 1044   oxyCODONE (Oxy IR/ROXICODONE) immediate release tablet 2.5-5 mg  2.5-5 mg Oral Q4H PRN Cherylann Ratel A, DO   5 mg at 03/17/22 2120   pantoprazole (PROTONIX) EC tablet 40 mg  40 mg Oral Daily Kyle, Tyrone A, DO   40 mg at 03/17/22 1030   prochlorperazine (COMPAZINE) injection 5 mg  5 mg Intravenous Q6H PRN Eugenie Filler, MD   5 mg at 03/15/22 1535   sodium bicarbonate tablet 650 mg  650 mg Oral BID Eugenie Filler, MD   650 mg at 03/17/22 2120     Discharge Medications: Please see discharge summary for a list of discharge medications.  Relevant Imaging Results:  Relevant Lab  Results:   Additional Information SSN [id number]  [place], LCSW

## 2022-03-18 NOTE — Progress Notes (Signed)
PT demonstrated hands understanding of Flutter device. NPC at this time. Dry cough.

## 2022-03-19 DIAGNOSIS — W19XXXD Unspecified fall, subsequent encounter: Secondary | ICD-10-CM | POA: Diagnosis not present

## 2022-03-19 DIAGNOSIS — S22008A Other fracture of unspecified thoracic vertebra, initial encounter for closed fracture: Secondary | ICD-10-CM

## 2022-03-19 DIAGNOSIS — Z170 Estrogen receptor positive status [ER+]: Secondary | ICD-10-CM

## 2022-03-19 DIAGNOSIS — C50411 Malignant neoplasm of upper-outer quadrant of right female breast: Secondary | ICD-10-CM

## 2022-03-19 DIAGNOSIS — S2249XA Multiple fractures of ribs, unspecified side, initial encounter for closed fracture: Secondary | ICD-10-CM

## 2022-03-19 DIAGNOSIS — N179 Acute kidney failure, unspecified: Secondary | ICD-10-CM | POA: Diagnosis not present

## 2022-03-19 LAB — COMPREHENSIVE METABOLIC PANEL
ALT: 27 U/L (ref 0–44)
AST: 87 U/L — ABNORMAL HIGH (ref 15–41)
Albumin: 2 g/dL — ABNORMAL LOW (ref 3.5–5.0)
Alkaline Phosphatase: 129 U/L — ABNORMAL HIGH (ref 38–126)
Anion gap: 7 (ref 5–15)
BUN: 42 mg/dL — ABNORMAL HIGH (ref 8–23)
CO2: 17 mmol/L — ABNORMAL LOW (ref 22–32)
Calcium: 8 mg/dL — ABNORMAL LOW (ref 8.9–10.3)
Chloride: 108 mmol/L (ref 98–111)
Creatinine, Ser: 1.92 mg/dL — ABNORMAL HIGH (ref 0.44–1.00)
GFR, Estimated: 26 mL/min — ABNORMAL LOW (ref >60)
Glucose, Bld: 84 mg/dL (ref 70–99)
Potassium: 3.7 mmol/L (ref 3.5–5.1)
Sodium: 132 mmol/L — ABNORMAL LOW (ref 135–145)
Total Bilirubin: 1.4 mg/dL — ABNORMAL HIGH (ref 0.3–1.2)
Total Protein: 5 g/dL — ABNORMAL LOW (ref 6.5–8.1)

## 2022-03-19 LAB — CBC
HCT: 25.8 % — ABNORMAL LOW (ref 36.0–46.0)
Hemoglobin: 8.2 g/dL — ABNORMAL LOW (ref 12.0–15.0)
MCH: 32.5 pg (ref 26.0–34.0)
MCHC: 31.8 g/dL (ref 30.0–36.0)
MCV: 102.4 fL — ABNORMAL HIGH (ref 80.0–100.0)
Platelets: 78 10*3/uL — ABNORMAL LOW (ref 150–400)
RBC: 2.52 MIL/uL — ABNORMAL LOW (ref 3.87–5.11)
RDW: 15.9 % — ABNORMAL HIGH (ref 11.5–15.5)
WBC: 3.1 10*3/uL — ABNORMAL LOW (ref 4.0–10.5)
nRBC: 1 % — ABNORMAL HIGH (ref 0.0–0.2)

## 2022-03-19 LAB — CULTURE, BLOOD (ROUTINE X 2)
Culture: NO GROWTH
Special Requests: ADEQUATE

## 2022-03-19 LAB — MAGNESIUM: Magnesium: 2.2 mg/dL (ref 1.7–2.4)

## 2022-03-19 MED ORDER — HYDROCORTISONE (PERIANAL) 2.5 % EX CREA
TOPICAL_CREAM | Freq: Three times a day (TID) | CUTANEOUS | Status: DC
  Administered 2022-03-27 – 2022-04-01 (×7): 1 via RECTAL
  Filled 2022-03-19 (×2): qty 28.35

## 2022-03-19 NOTE — Progress Notes (Signed)
Occupational Therapy Treatment Patient Details Name: Catherine Decker MRN: [id number] DOB: Mar 11, 1940 Today's Date: 03/19/2022   History of present illness Patient is a 82 year old female who presented after a fall down some stairs. Patient was found to have left rib fractures 4-5, right rib fractures 7-8, T7 compression fx, T1 spinous process fx, SDH.  Pt also found to have influenza A. IOM:BTDHR breast cancer, arthritis, hyperlipidemia, splenic artery aneurysm   OT comments  Patient able to transfer to [facility] with min guard and perform toileting with supervision. She took steps to the recliner and performed oral care in chair. Patient progressing well.    Recommendations for follow up therapy are one component of a multi-disciplinary discharge planning process, led by the attending physician.  Recommendations may be updated based on patient status, additional functional criteria and insurance authorization.    Follow Up Recommendations  Skilled nursing-short term rehab (<3 hours/day)     Assistance Recommended at Discharge Frequent or constant Supervision/Assistance  Patient can return home with the following  A little help with walking and/or transfers;A lot of help with bathing/dressing/bathroom;Assistance with cooking/housework;Help with stairs or ramp for entrance   Equipment Recommendations  None recommended by OT    Recommendations for Other Services      Precautions / Restrictions Precautions Precautions: Fall Precaution Comments: rib fractures, back preacutions Restrictions Weight Bearing Restrictions: No       Mobility Bed Mobility                    Transfers                         Balance Overall balance assessment: Mild deficits observed, not formally tested                                         ADL either performed or assessed with clinical judgement   ADL       Grooming: Set up;Sitting;Oral care;Wash/dry face                    Toilet Transfer: BSC/3in1;Min guard   Toileting- Clothing Manipulation and Hygiene: Supervision/safety;Sit to/from stand       Functional mobility during ADLs: Min guard General ADL Comments: Patietn held onto [facility] to transfer herself. Then able to to take steps holding onto bed rail to recliner.    Extremity/Trunk Assessment              Vision       Perception     Praxis      Cognition Arousal/Alertness: Awake/alert Behavior During Therapy: WFL for tasks assessed/performed Overall Cognitive Status: Within Functional Limits for tasks assessed                                          Exercises      Shoulder Instructions       General Comments      Pertinent Vitals/ Pain       Pain Assessment Pain Assessment: Faces Faces Pain Scale: Hurts even more Pain Location: ribs with movement Pain Descriptors / Indicators: Discomfort, Grimacing Pain Intervention(s): Monitored during session  Home Living  Prior Functioning/Environment              Frequency  Min 2X/week        Progress Toward Goals  OT Goals(current goals can now be found in the care plan section)  Progress towards OT goals: Progressing toward goals  Acute Rehab OT Goals Patient Stated Goal: get better OT Goal Formulation: With patient Time For Goal Achievement: 03/29/22 Potential to Achieve Goals: [place] Discharge plan remains appropriate    Co-evaluation                 AM-PAC OT "6 Clicks" Daily Activity     Outcome Measure   Help from another person eating meals?: None Help from another person taking care of personal grooming?: A Little Help from another person toileting, which includes using toliet, bedpan, or urinal?: A Little Help from another person bathing (including washing, rinsing, drying)?: A Little Help from another person to put on and taking off regular upper  body clothing?: A Little Help from another person to put on and taking off regular lower body clothing?: A Lot 6 Click Score: 18    End of Session    OT Visit Diagnosis: Unsteadiness on feet (R26.81);Other abnormalities of gait and mobility (R26.89);Muscle weakness (generalized) (M62.81)   Activity Tolerance Patient tolerated treatment well   Patient Left in chair;with call bell/phone within reach;with chair alarm set   Nurse Communication Mobility status        Time: [phone] OT Time Calculation (min): 16 min  Charges: OT General Charges $OT Visit: 1 Visit OT Treatments $Self Care/Home Management : 8-22 mins  Gustavo Lah, OTR/L [place]  Office [phone]   Lenward Chancellor 03/19/2022, 2:55 PM

## 2022-03-19 NOTE — TOC Progression Note (Signed)
Transition of Care [facility]) - Progression Note    Patient Details  Name: CAMARY SOSA MRN: [id number] Date of Birth: 04/30/1940  Transition of Care [facility]) CM/SW [place], LCSW Phone Number: 03/19/2022, 9:40 AM  Clinical Narrative:     CSW spoke with pt's daughter regarding pt's decision to return home once medically stable. CSW explained to pt's daughter the hospital can not force the pt to go to rehab.  Pt's daughter reported she will discuss things with her mother this afternoon, and call this Probation officer back. TOC to follow.  Expected Discharge Plan: [place] Barriers to Discharge: Continued Medical Work up  Expected Discharge Plan and Services       Living arrangements for the past 2 months: Single Family Home                                       Social Determinants of Health (SDOH) Interventions SDOH Screenings   Food Insecurity: No Food Insecurity (03/14/2022)  Housing: Low Risk  (03/14/2022)  Transportation Needs: No Transportation Needs (03/14/2022)  Utilities: Not At Risk (03/14/2022)  Tobacco Use: Low Risk  (03/14/2022)    Readmission Risk Interventions     No data to display

## 2022-03-19 NOTE — Progress Notes (Signed)
Patient requested SCD's be removed and refuses to wear them. Patient educated, declined reapplication of SCD's.

## 2022-03-19 NOTE — Plan of Care (Signed)
  Problem: Clinical Measurements: Goal: Diagnostic test results will improve Outcome: Progressing Goal: Respiratory complications will improve Outcome: Progressing   Problem: Activity: Goal: Risk for activity intolerance will decrease Outcome: Progressing   Problem: Safety: Goal: Ability to remain free from injury will improve Outcome: Progressing   Problem: Skin Integrity: Goal: Risk for impaired skin integrity will decrease Outcome: Progressing

## 2022-03-19 NOTE — Progress Notes (Addendum)
PROGRESS NOTE    Catherine Decker  ZRA:[id number] DOB: 09-Dec-1940 DOA: 03/14/2022 PCP: Antony Contras, MD   Brief Narrative:  The Patient is an obese 82 year old female history of hypothyroidism, hypertension, as well as other comorbidities who presented to the ED after she fell down the stairs 2 days prior to admission slipped while walking hit her head on the way down but did not suffer any LOC. Patient was able to get up with help from a friend however decided to stay home. Patient's daughter noted that patient had more confusion and fatigue over the past 2 days prior to admission with decreased appetite and a 1-1/2-week history of cough and congestion and subsequently brought to the ED as patient with no clinical improvement.   Patient was seen in the ED, workup done concerning for subdural hematoma, chronic hygromas, multiple rib fractures, pneumonia, influenza A, AKI on CKD stage IIIb. Neurosurgery reviewed films and felt no further workup needed intervention at this time. Trauma surgery also assessed the patient and recommended pain management, pulmonary toileting. Patient admitted placed on empiric IV antibiotics and Tamiflu.  She is subsequently improving but renal function continues to be elevated.  Will need to ensure adequate pain control.  PT OT recommending SNF.  Assessment and Plan: No notes have been filed under this hospital service. Service: Hospitalist  Fall with associated multiple rib fractures Spinous process fracture of T1 Subdural hematoma/chronic hygromas -Secondary to mechanical fall. -Patient assessed by the trauma team who reviewed imaging and recommended pain management, pulmonary toileting, incentive spirometry use secondary to rib fractures to prevent worsening pneumonia and no further intervention at this time. -Continue incentive spirometry, flutter valve, treatment of pneumonia, pain management with Methocarbamol 1000 mg po TID, scheduled Acetaminophen 1000 mg q6h.  Oxycodone IR 2.5-5 mg q4prn Moderate to Severe Pain. -Films reviewed by neurosurgery who feel no intervention needed at this time. -PT/OT recommending SNF but patient wants to go home with Home Health    Influenza A -Initiated Tamiflu and completed 5 Days -C/w Supportive Care    Right-sided pneumonia -Noted on CT chest. -Patient with fall with rib fractures, likely can take deep inspiratory effort. -Urine strep pneumococcus antigen positive. -Urine Legionella antigen still pending. -Continue IV Rocephin to complete a 5-day course of treatment. -Discontinued IV azithromycin. -Continue scheduled DuoNebs BID and q4hprn, Flonase, Claritin, PPI, Mucinex. -Repeat CXR in the AM    Hypothyroidism -Continue home regimen of Levothyroxine 112 mcg po Daily.    AKI on CKD stage IIIb Metabolic Acidosis -Likely secondary to volume depletion, dehydration in the setting of ARB. -Renal ultrasound negative for hydronephrosis. -Urinalysis with concerns for UTI. -Urine sodium of 13, urine creatinine 195. -BUN/Cr Trend: Recent Labs  Lab 03/14/22 1301 03/15/22 0427 03/16/22 0512 03/17/22 0423 03/18/22 0443 03/19/22 0403  BUN 35* 43* 47* 46* 42* 42*  CREATININE 2.24* 2.51* 2.56* 2.24* 2.17* 1.92*  -Continued IV fluids at 75 cc an hour but will now stop -Patient has a slight metabolic acidosis with a CO2 of 17, anion gap of 7, chloride level 108 -Continue with sodium bicarbonate 650 mg p.o. twice daily for 3 days but may extend further -Continue to hold ARB, avoid nephrotoxins.   -Monitor urine output.   -Likely will not resume ARB on discharge. -Avoid Further Nephrotoxic Medications, Contrast Dyes, Hypotension and Dehydration to Ensure Adequate Renal Perfusion and will need to Renally Adjust Meds -Continue to Monitor and Trend Renal Function carefully and repeat CMP in the AM    UTI -  Urinalysis concerning for UTI as it showed a hazy appearance with amber color urine, moderate leukocytes,  negative nitrites, many bacteria, 0-5 RBCs per high-power field, 21-50 WBCs. -Urine cultures negative.  -Patient empirically on IV Rocephin secondary to right-sided pneumonia.  Hyperlipidemia -Continue to hold home regimen for now   History of breast cancer status postmastectomy/spinal lesions -Imaging with T1 a right scapular lesion concerning for Metastasis . -Patient's primary oncologist, Dr. Lorenso Courier has been notified via epic of patient's admission. History of Breast Cancer -Continue home regimen with Letrozole 2.5 mg po Daily.    -Oncology notified via epic of admission but likely will need outpatient follow up    Abnormal LFTs/Transaminitis -Likely secondary to history of cirrhosis. -Right upper quadrant ultrasound consistent with cirrhosis with portal hypertension, low-density structure in the left lobe of the liver highlighted on prior noncontrast CTs up oropharynx. -LFTs trending down from admission and close to baseline as Trend shows: Recent Labs  Lab 03/14/22 1301 03/15/22 0427 03/16/22 0512 03/17/22 0423 03/19/22 0403  AST 88* 59* 64* 76* 87*  ALT 32 '25 26 28 27  '$ -Outpatient follow-up.   History of cirrhosis -Outpatient follow-up with primary hepatologist. -Explains elevated T Bili and AST   Pancytopenia -Patient with a pancytopenia being followed by hematology/oncology in the outpatient setting. -CBC Trend: Recent Labs  Lab 03/14/22 1301 03/15/22 0427 03/16/22 0512 03/17/22 0423 03/18/22 0443 03/19/22 0403  WBC 7.1 2.6* 2.9* 3.5* 3.7* 3.1*  HGB 9.5* 7.5* 8.1* 8.7* 8.8* 8.2*  HCT 29.3* 22.5* 25.1* 27.7* 27.0* 25.8*  PLT 59* 40* 50* 59* 77* 78*  -Worsening thrombocytopenia and now patient with a leukopenia unchanged from prior leukopenia likely due to acute infection. -Thrombocytopenia and leukopenia improving daily. -Patient with no S/Sx of bleeding. -Hematology/oncology informed of admission via epic. -Will continue to Monitor and Trend    Lactic  Acidosis -Likely secondary to acute infection of pneumonia and influenza A. -Lactic acid levels trending down. -Continue IV fluids, IV antibiotics. -Repeat labs in the AM.   Diabetes mellitus type 2 -Hemoglobin A1c 5.0 (11/05/2020). -CBG's ranging from 80-138 -Hemoglobin A1c 4.9.   -Continue to hold oral hypoglycemic agents.  -Discontinued SSI, CBGs.  -Outpatient follow-up.    Hypertension -BP stable and had been somewhat soft. -Continue to hold home regimen antihypertensive medications.   -IV fluids have now stopped  -Continue to Monitor BP per protocol -Last BP reading was 139/52   Pressure injury, stage I medial buttocks, POA Pressure Injury 03/14/22 Buttocks Medial;Mid Stage 1 -  Intact skin with non-blanchable redness of a localized area usually over a bony prominence. (Active)  03/14/22 2200  Location: Buttocks  Location Orientation: Medial;Mid  Staging: Stage 1 -  Intact skin with non-blanchable redness of a localized area usually over a bony prominence.  Wound Description (Comments):   Present on Admission: Yes   Hypoalbuminemia -Patient's Albumin Level went from 1.9 -> 2.3 -> 2.0 -> 2.0 -Continue to Monitor and Trend and Repeat CMP in the AM   Hyperbilirubinemia -Patient's T Bili went from 2.0 -> 1.5 -> 1.4 x2 and trending down -Continue to Monitor and Trend -Repeat CMP in the AM   Obesity -Complicates overall prognosis and care -Estimated body mass index is 30.81 kg/m as calculated from the following:   Height as of this encounter: '5\' 3"'$  (1.6 m).   Weight as of this encounter: 78.9 kg.  -Weight Loss and Dietary Counseling given  DVT prophylaxis: SCDs Start: 03/14/22 2212 Place and maintain sequential compression device  Start: 03/14/22 1706    Code Status: DNR Family Communication: No family currently at bedside  Disposition Plan:  Level of care: Progressive Status is: Inpatient Remains inpatient appropriate because: PT OT recommending SNF but she wants  to go home with home health.  Renal failure is improving will need to monitor and ensure adequate pain control prior to safe discharge disposition   Consultants:  Oncology notified of admission as a courtesy via Epic General Surgery/Trauma Team Dr. Bobbye Morton Neurosurgery  Procedures:  CT head CT C-spine 03/14/2022 Chest x-ray 03/14/2022 Renal ultrasound 03/14/2022 Right upper quadrant ultrasound 03/15/2022 CT chest abdomen and pelvis 03/14/2022  Antimicrobials:  Anti-infectives (From admission, onward)    Start     Dose/Rate Route Frequency Ordered Stop   03/15/22 1600  azithromycin (ZITHROMAX) 500 mg in sodium chloride 0.9 % 250 mL IVPB  Status:  Discontinued        500 mg 250 mL/hr over 60 Minutes Intravenous Every 24 hours 03/14/22 2211 03/17/22 1759   03/15/22 1000  cefTRIAXone (ROCEPHIN) 2 g in sodium chloride 0.9 % 100 mL IVPB        2 g 200 mL/hr over 30 Minutes Intravenous Every 24 hours 03/14/22 2211 03/20/22 0959   03/15/22 1000  oseltamivir (TAMIFLU) capsule 30 mg        30 mg Oral Daily 03/15/22 0910 03/20/22 0959   03/14/22 1530  cefTRIAXone (ROCEPHIN) 1 g in sodium chloride 0.9 % 100 mL IVPB        1 g 200 mL/hr over 30 Minutes Intravenous  Once 03/14/22 1515 03/14/22 1606   03/14/22 1530  azithromycin (ZITHROMAX) 500 mg in sodium chloride 0.9 % 250 mL IVPB        500 mg 250 mL/hr over 60 Minutes Intravenous  Once 03/14/22 1515 03/14/22 1717       Subjective: Seen and examined at bedside and she states that she is still having some pain especially in her ribs.  Denies any abdominal discomfort.  No nausea or vomiting.  Feels okay but still little short of breath.  No other concerns or complaints at this time.  Objective: Vitals:   03/19/22 0233 03/19/22 0503 03/19/22 0650 03/19/22 0844  BP: (!) 136/59  (!) 150/55   Pulse: 83     Resp: 18     Temp: 98.3 F (36.8 C)  97.8 F (36.6 C)   TempSrc: Oral  Oral   SpO2: 94%  96% 97%  Weight:  78.9 kg    Height:       No  intake or output data in the 24 hours ending 03/19/22 0903 Filed Weights   03/17/22 0500 03/18/22 0543 03/19/22 0503  Weight: 73.2 kg 71.9 kg 78.9 kg   Examination: Physical Exam:  Constitutional: WN/WD obese Caucasian female currently no acute distress appears a little uncomfortable Respiratory: Diminished to auscultation bilaterally, no wheezing, rales, rhonchi or crackles. Normal respiratory effort and patient is not tachypenic. No accessory muscle use.  Unlabored breathing Cardiovascular: RRR, no murmurs / rubs / gallops. S1 and S2 auscultated. No extremity edema. Abdomen: Soft, non-tender, distended secondary to body habitus. Bowel sounds positive.  GU: Deferred. Musculoskeletal: No clubbing / cyanosis of digits/nails. No joint deformity upper and lower extremities.  Skin: No rashes, lesions, ulcers on limited skin evaluation. No induration; Warm and dry.  Neurologic: CN 2-12 grossly intact with no focal deficits. Romberg sign and cerebellar reflexes not assessed.  Psychiatric: Normal judgment and insight. Alert and oriented x 3. Normal  mood and appropriate affect.   Data Reviewed: I have personally reviewed following labs and imaging studies  CBC: Recent Labs  Lab 03/15/22 0427 03/16/22 0512 03/17/22 0423 03/18/22 0443 03/19/22 0403  WBC 2.6* 2.9* 3.5* 3.7* 3.1*  NEUTROABS  --  2.2 2.6 2.3  --   HGB 7.5* 8.1* 8.7* 8.8* 8.2*  HCT 22.5* 25.1* 27.7* 27.0* 25.8*  MCV 100.0 101.2* 103.7* 101.1* 102.4*  PLT 40* 50* 59* 77* 78*   Basic Metabolic Panel: Recent Labs  Lab 03/15/22 0427 03/16/22 0512 03/17/22 0423 03/18/22 0443 03/19/22 0403  NA 138 136 134* 134* 132*  K 3.9 3.9 3.8 4.0 3.7  CL 112* 111 109 108 108  CO2 18* 17* 17* 18* 17*  GLUCOSE 95 92 90 83 84  BUN 43* 47* 46* 42* 42*  CREATININE 2.51* 2.56* 2.24* 2.17* 1.92*  CALCIUM 8.1* 8.1* 8.1* 8.1* 8.0*  MG  --   --  1.6* 2.5* 2.2  PHOS  --   --  3.3  --   --    GFR: Estimated Creatinine Clearance: 23.2  mL/min (A) (by C-G formula based on SCr of 1.92 mg/dL (H)). Liver Function Tests: Recent Labs  Lab 03/14/22 1301 03/15/22 0427 03/16/22 0512 03/17/22 0423 03/19/22 0403  AST 88* 59* 64* 76* 87*  ALT 32 '25 26 28 27  '$ ALKPHOS 109 79 100 106 129*  BILITOT 2.9* 2.0* 1.5* 1.4* 1.4*  PROT 6.3* 4.9* 5.3* 5.3* 5.0*  ALBUMIN 2.8* 1.9* 2.3* 2.0* 2.0*   No results for input(s): "LIPASE", "AMYLASE" in the last 168 hours. Recent Labs  Lab 03/14/22 1301  AMMONIA 32   Coagulation Profile: No results for input(s): "INR", "PROTIME" in the last 168 hours. Cardiac Enzymes: No results for input(s): "CKTOTAL", "CKMB", "CKMBINDEX", "TROPONINI" in the last 168 hours. BNP (last 3 results) No results for input(s): "PROBNP" in the last 8760 hours. HbA1C: No results for input(s): "HGBA1C" in the last 72 hours. CBG: Recent Labs  Lab 03/16/22 2128 03/17/22 0749 03/17/22 1155 03/17/22 1638 03/17/22 2120  GLUCAP 138* 80 88 98 86   Lipid Profile: No results for input(s): "CHOL", "HDL", "LDLCALC", "TRIG", "CHOLHDL", "LDLDIRECT" in the last 72 hours. Thyroid Function Tests: No results for input(s): "TSH", "T4TOTAL", "FREET4", "T3FREE", "THYROIDAB" in the last 72 hours. Anemia Panel: No results for input(s): "VITAMINB12", "FOLATE", "FERRITIN", "TIBC", "IRON", "RETICCTPCT" in the last 72 hours. Sepsis Labs: Recent Labs  Lab 03/14/22 1301 03/14/22 1720 03/16/22 0512 03/17/22 0423  LATICACIDVEN 4.0* 2.2* 3.1* 2.6*    Recent Results (from the past 240 hour(s))  Resp panel by RT-PCR (RSV, Flu A&B, Covid) Anterior Nasal Swab     Status: Abnormal   Collection Time: 03/14/22 11:12 AM   Specimen: Anterior Nasal Swab  Result Value Ref Range Status   SARS Coronavirus 2 by RT PCR NEGATIVE NEGATIVE Final    Comment: (NOTE) SARS-CoV-2 target nucleic acids are NOT DETECTED.  The SARS-CoV-2 RNA is generally detectable in upper respiratory specimens during the acute phase of infection. The  lowest concentration of SARS-CoV-2 viral copies this assay can detect is 138 copies/mL. A negative result does not preclude SARS-Cov-2 infection and should not be used as the sole basis for treatment or other patient management decisions. A negative result may occur with  improper specimen collection/handling, submission of specimen other than nasopharyngeal swab, presence of viral mutation(s) within the areas targeted by this assay, and inadequate number of viral copies(<138 copies/mL). A negative result must be combined with clinical  observations, patient history, and epidemiological information. The expected result is Negative.  Fact Sheet for Patients:  EntrepreneurPulse.com.au  Fact Sheet for Healthcare Providers:  IncredibleEmployment.be  This test is no t yet approved or cleared by the [place] FDA and  has been authorized for detection and/or diagnosis of SARS-CoV-2 by FDA under an Emergency Use Authorization (EUA). This EUA will remain  in effect (meaning this test can be used) for the duration of the COVID-19 declaration under Section 564(b)(1) of the Act, 21 U.S.C.section 360bbb-3(b)(1), unless the authorization is terminated  or revoked sooner.       Influenza A by PCR POSITIVE (A) NEGATIVE Final   Influenza B by PCR NEGATIVE NEGATIVE Final    Comment: (NOTE) The Xpert Xpress SARS-CoV-2/FLU/RSV plus assay is intended as an aid in the diagnosis of influenza from Nasopharyngeal swab specimens and should not be used as a sole basis for treatment. Nasal washings and aspirates are unacceptable for Xpert Xpress SARS-CoV-2/FLU/RSV testing.  Fact Sheet for Patients: EntrepreneurPulse.com.au  Fact Sheet for Healthcare Providers: IncredibleEmployment.be  This test is not yet approved or cleared by the [place] FDA and has been authorized for detection and/or diagnosis of SARS-CoV-2 by FDA  under an Emergency Use Authorization (EUA). This EUA will remain in effect (meaning this test can be used) for the duration of the COVID-19 declaration under Section 564(b)(1) of the Act, 21 U.S.C. section 360bbb-3(b)(1), unless the authorization is terminated or revoked.     Resp Syncytial Virus by PCR NEGATIVE NEGATIVE Final    Comment: (NOTE) Fact Sheet for Patients: EntrepreneurPulse.com.au  Fact Sheet for Healthcare Providers: IncredibleEmployment.be  This test is not yet approved or cleared by the [place] FDA and has been authorized for detection and/or diagnosis of SARS-CoV-2 by FDA under an Emergency Use Authorization (EUA). This EUA will remain in effect (meaning this test can be used) for the duration of the COVID-19 declaration under Section 564(b)(1) of the Act, 21 U.S.C. section 360bbb-3(b)(1), unless the authorization is terminated or revoked.  Performed at [facility], [place] [address] [place], [place] [address]   Culture, blood (Routine X 2) w Reflex to ID Panel     Status: None (Preliminary result)   Collection Time: 03/14/22  5:20 PM   Specimen: BLOOD  Result Value Ref Range Status   Specimen Description   Final    BLOOD LEFT ANTECUBITAL Performed at [place] [address] [place], [place] [address]    Special Requests   Final    BOTTLES DRAWN AEROBIC AND ANAEROBIC Blood Culture adequate volume Performed at [place] [address] [place], [place] [address]    Culture   Final    NO GROWTH 4 DAYS Performed at [facility], [place] [address] [place], [place] [address]    Report Status PENDING  Incomplete  Culture, blood (Routine X 2) w Reflex to ID Panel     Status: None (Preliminary result)   Collection Time: 03/14/22 10:56 PM   Specimen: BLOOD  Result Value Ref Range Status   Specimen Description   Final    BLOOD BLOOD LEFT ARM Performed  at [place] [address] [place], [place] [address]    Special Requests   Final    BOTTLES DRAWN AEROBIC ONLY Blood Culture adequate volume Performed at [place] [address], [place], [place] [address]    Culture   Final    NO  GROWTH 3 DAYS Performed at [facility], [place] [address], [place], [place] [address]    Report Status PENDING  Incomplete  Urine Culture     Status: None   Collection Time: 03/15/22  7:53 AM   Specimen: Urine, Clean Catch  Result Value Ref Range Status   Specimen Description   Final    URINE, CLEAN CATCH Performed at [facility], [place] [address], [place], [place] [address]    Special Requests   Final    NONE Performed at [facility], [place] [address], [place], [place] [address]    Culture   Final    NO GROWTH Performed at [facility], [place] [address] [place],  [address]    Report Status 03/16/2022 FINAL  Final    Radiology Studies: No results found.  Scheduled Meds:  acetaminophen  1,000 mg Oral Q6H   calcium carbonate  1,250 mg Oral Q breakfast   cholecalciferol  1,000 Units Oral Daily   cyanocobalamin  1,000 mcg Oral Daily   fluticasone  2 spray Each Nare Daily   folic acid  1 mg Oral Daily   guaiFENesin  1,200 mg Oral BID   ipratropium-albuterol  3 mL Nebulization BID   letrozole  2.5 mg Oral Daily   levothyroxine  112 mcg Oral Q0600   loratadine  10 mg Oral Daily   methocarbamol  1,000 mg Oral TID   oseltamivir  30 mg Oral Daily   pantoprazole  40 mg Oral Daily   senna-docusate  1 tablet Oral BID   sodium bicarbonate  650 mg Oral BID   Continuous Infusions:  cefTRIAXone (ROCEPHIN)  IV 2 g (03/18/22 1125)   lactated ringers 75 mL/hr at 03/18/22 2301    LOS: 5 days   Raiford Noble, DO [facility] Available via Epic secure chat 7am-7pm After these hours, please refer to coverage provider listed on [email or web address] 03/19/2022,  9:03 AM

## 2022-03-20 ENCOUNTER — Inpatient Hospital Stay (HOSPITAL_COMMUNITY): Payer: Medicare HMO

## 2022-03-20 DIAGNOSIS — N179 Acute kidney failure, unspecified: Secondary | ICD-10-CM | POA: Diagnosis not present

## 2022-03-20 DIAGNOSIS — W19XXXD Unspecified fall, subsequent encounter: Secondary | ICD-10-CM | POA: Diagnosis not present

## 2022-03-20 DIAGNOSIS — E039 Hypothyroidism, unspecified: Secondary | ICD-10-CM | POA: Diagnosis not present

## 2022-03-20 DIAGNOSIS — D539 Nutritional anemia, unspecified: Secondary | ICD-10-CM

## 2022-03-20 DIAGNOSIS — S22008A Other fracture of unspecified thoracic vertebra, initial encounter for closed fracture: Secondary | ICD-10-CM | POA: Diagnosis not present

## 2022-03-20 LAB — CBC WITH DIFFERENTIAL/PLATELET
Abs Immature Granulocytes: 0.44 10*3/uL — ABNORMAL HIGH (ref 0.00–0.07)
Basophils Absolute: 0 10*3/uL (ref 0.0–0.1)
Basophils Relative: 1 %
Eosinophils Absolute: 0.1 10*3/uL (ref 0.0–0.5)
Eosinophils Relative: 2 %
HCT: 26.3 % — ABNORMAL LOW (ref 36.0–46.0)
Hemoglobin: 8.4 g/dL — ABNORMAL LOW (ref 12.0–15.0)
Immature Granulocytes: 11 %
Lymphocytes Relative: 13 %
Lymphs Abs: 0.5 10*3/uL — ABNORMAL LOW (ref 0.7–4.0)
MCH: 32.7 pg (ref 26.0–34.0)
MCHC: 31.9 g/dL (ref 30.0–36.0)
MCV: 102.3 fL — ABNORMAL HIGH (ref 80.0–100.0)
Monocytes Absolute: 0.3 10*3/uL (ref 0.1–1.0)
Monocytes Relative: 8 %
Neutro Abs: 2.7 10*3/uL (ref 1.7–7.7)
Neutrophils Relative %: 65 %
Platelets: 92 10*3/uL — ABNORMAL LOW (ref 150–400)
RBC: 2.57 MIL/uL — ABNORMAL LOW (ref 3.87–5.11)
RDW: 16.1 % — ABNORMAL HIGH (ref 11.5–15.5)
WBC: 4.1 10*3/uL (ref 4.0–10.5)
nRBC: 0.5 % — ABNORMAL HIGH (ref 0.0–0.2)

## 2022-03-20 LAB — COMPREHENSIVE METABOLIC PANEL
ALT: 30 U/L (ref 0–44)
AST: 95 U/L — ABNORMAL HIGH (ref 15–41)
Albumin: 2.1 g/dL — ABNORMAL LOW (ref 3.5–5.0)
Alkaline Phosphatase: 139 U/L — ABNORMAL HIGH (ref 38–126)
Anion gap: 7 (ref 5–15)
BUN: 40 mg/dL — ABNORMAL HIGH (ref 8–23)
CO2: 18 mmol/L — ABNORMAL LOW (ref 22–32)
Calcium: 8.3 mg/dL — ABNORMAL LOW (ref 8.9–10.3)
Chloride: 108 mmol/L (ref 98–111)
Creatinine, Ser: 1.92 mg/dL — ABNORMAL HIGH (ref 0.44–1.00)
GFR, Estimated: 26 mL/min — ABNORMAL LOW (ref >60)
Glucose, Bld: 100 mg/dL — ABNORMAL HIGH (ref 70–99)
Potassium: 3.7 mmol/L (ref 3.5–5.1)
Sodium: 133 mmol/L — ABNORMAL LOW (ref 135–145)
Total Bilirubin: 1.6 mg/dL — ABNORMAL HIGH (ref 0.3–1.2)
Total Protein: 5 g/dL — ABNORMAL LOW (ref 6.5–8.1)

## 2022-03-20 LAB — CULTURE, BLOOD (ROUTINE X 2)
Culture: NO GROWTH
Special Requests: ADEQUATE

## 2022-03-20 LAB — MAGNESIUM: Magnesium: 2.1 mg/dL (ref 1.7–2.4)

## 2022-03-20 LAB — PHOSPHORUS: Phosphorus: 3.3 mg/dL (ref 2.5–4.6)

## 2022-03-20 MED ORDER — SODIUM BICARBONATE 650 MG PO TABS
650.0000 mg | ORAL_TABLET | Freq: Two times a day (BID) | ORAL | Status: DC
  Administered 2022-03-20 – 2022-03-21 (×2): 650 mg via ORAL
  Filled 2022-03-20 (×2): qty 1

## 2022-03-20 NOTE — Care Management Important Message (Signed)
Important Message  Patient Details  Name: Catherine Decker MRN: [id number] Date of Birth: May 26, 1940   Medicare Important Message Given:  Yes     Memory Argue 03/20/2022, 12:11 PM

## 2022-03-20 NOTE — Progress Notes (Signed)
PROGRESS NOTE    Catherine Decker  QQP:[id number] DOB: 12/21/1940 DOA: 03/14/2022 PCP: Antony Contras, MD   Brief Narrative:  The Patient is an obese 82 year old female history of hypothyroidism, hypertension, as well as other comorbidities who presented to the ED after she fell down the stairs 2 days prior to admission slipped while walking hit her head on the way down but did not suffer any LOC. Patient was able to get up with help from a friend however decided to stay home. Patient's daughter noted that patient had more confusion and fatigue over the past 2 days prior to admission with decreased appetite and a 1-1/2-week history of cough and congestion and subsequently brought to the ED as patient with no clinical improvement.    Patient was seen in the ED, workup done concerning for subdural hematoma, chronic hygromas, multiple rib fractures, pneumonia, influenza A, AKI on CKD stage IIIb. Neurosurgery reviewed films and felt no further workup needed intervention at this time. Trauma surgery also assessed the patient and recommended pain management, pulmonary toileting. Patient admitted placed on empiric IV antibiotics and Tamiflu.  She is subsequently improving but renal function continues to be elevated.  Will need to ensure adequate pain control.  PT OT still recommending SNF.   Assessment and Plan:  Fall with associated multiple rib fractures Spinous process fracture of T1 Subdural hematoma/chronic hygromas -Secondary to mechanical fall. -Patient assessed by the trauma team who reviewed imaging and recommended pain management, pulmonary toileting, incentive spirometry use secondary to rib fractures to prevent worsening pneumonia and no further intervention at this time. -Continue incentive spirometry, flutter valve, treatment of pneumonia, pain management with Methocarbamol 1000 mg po TID, scheduled Acetaminophen 1000 mg q6h. Oxycodone IR 2.5-5 mg q4prn Moderate to Severe Pain. -Films  reviewed by neurosurgery who feel no intervention needed at this time. -PT/OT recommending SNF but patient wants to go home with Home Health    Influenza A -Initiated Tamiflu and completed 5 Days -C/w Supportive Care    Right-sided pneumonia -Noted on CT chest. -Patient with fall with rib fractures, likely can take deep inspiratory effort. -Urine strep pneumococcus antigen positive. -Urine Legionella antigen still pending. -Continue IV Rocephin to complete a 5-day course of treatment. -Discontinued IV azithromycin. -Continue scheduled DuoNebs BID and q4hprn, Flonase, Claritin, PPI, Mucinex. -Repeat CXR in the AM    Hypothyroidism -Continue home regimen of Levothyroxine 112 mcg po Daily.    AKI on CKD stage IIIb Metabolic Acidosis -Likely secondary to volume depletion, dehydration in the setting of ARB. -Renal ultrasound negative for hydronephrosis. -Urinalysis with concerns for UTI. -Urine sodium of 13, urine creatinine 195. -BUN/Cr Trend: Recent Labs  Lab 03/14/22 1301 03/15/22 0427 03/16/22 0512 03/17/22 0423 03/18/22 0443 03/19/22 0403 03/20/22 0445  BUN 35* 43* 47* 46* 42* 42* 40*  CREATININE 2.24* 2.51* 2.56* 2.24* 2.17* 1.92* 1.92*  -IVF Hydration has stopped  -Patient has a slight metabolic acidosis with a CO2 of 18, anion gap of 7, chloride level 108 -Continue with sodium bicarbonate 650 mg p.o. twice daily for 3 days but may extend further -Continue to hold ARB, avoid nephrotoxins.   -Monitor urine output.   -Likely will not resume ARB on discharge. -Avoid Further Nephrotoxic Medications, Contrast Dyes, Hypotension and Dehydration to Ensure Adequate Renal Perfusion and will need to Renally Adjust Meds -Continue to Monitor and Trend Renal Function carefully and repeat CMP in the AM    UTI -Urinalysis concerning for UTI as it showed a hazy appearance with  amber color urine, moderate leukocytes, negative nitrites, many bacteria, 0-5 RBCs per high-power field,  21-50 WBCs. -Urine cultures negative.  -Patient empirically on IV Rocephin secondary to right-sided pneumonia.   Hyperlipidemia -Continue to hold home regimen for now   History of breast cancer status postmastectomy/spinal lesions -Imaging with T1 a right scapular lesion concerning for Metastasis . -Patient's primary oncologist, Dr. Lorenso Courier has been notified via epic of patient's admission. -Continue home regimen with Letrozole 2.5 mg po Daily.    -Oncology notified via epic of admission but likely will need outpatient follow up    Abnormal LFTs/Transaminitis -Likely secondary to history of cirrhosis. -Right upper quadrant ultrasound consistent with cirrhosis with portal hypertension, low-density structure in the left lobe of the liver highlighted on prior noncontrast CTs up oropharynx. -LFTs trending down from admission and close to baseline as Trend shows: Recent Labs  Lab 03/14/22 1301 03/15/22 0427 03/16/22 0512 03/17/22 0423 03/19/22 0403 03/20/22 0445  AST 88* 59* 64* 76* 87* 95*  ALT 32 '25 26 28 27 30  '$ -Outpatient follow-up.   History of cirrhosis -Outpatient follow-up with primary hepatologist. -Explains elevated T Bili and AST -Continue to Monitor and Trend   Pancytopenia -Patient with a pancytopenia being followed by hematology/oncology in the outpatient setting. -CBC Trend: Recent Labs  Lab 03/14/22 1301 03/15/22 0427 03/16/22 0512 03/17/22 0423 03/18/22 0443 03/19/22 0403 03/20/22 0445  WBC 7.1 2.6* 2.9* 3.5* 3.7* 3.1* 4.1  HGB 9.5* 7.5* 8.1* 8.7* 8.8* 8.2* 8.4*  HCT 29.3* 22.5* 25.1* 27.7* 27.0* 25.8* 26.3*  MCV 100.7* 100.0 101.2* 103.7* 101.1* 102.4* 102.3*  PLT 59* 40* 50* 59* 77* 78* 92*  -Worsening thrombocytopenia and now patient with a leukopenia unchanged from prior leukopenia likely due to acute infection. -Thrombocytopenia and leukopenia improving daily. -Patient with no S/Sx of bleeding. -Hematology/oncology informed of admission via  epic. -Will continue to Monitor and Trend    Lactic Acidosis -Likely secondary to acute infection of pneumonia and influenza A. -Lactic acid levels trending down. -Continue IV antibiotics and IVF have now stopped  -Repeat labs in the AM.   Diabetes mellitus type 2 -Hemoglobin A1c 5.0 (11/05/2020). -CBG's ranging from 80-138 -Hemoglobin A1c 4.9.   -Continue to hold oral hypoglycemic agents.  -Discontinued SSI, CBGs.  -Outpatient follow-up.    Hypertension -BP stable and had been somewhat soft. -Continue to hold home regimen antihypertensive medications.   -IV fluids have now stopped  -Continue to Monitor BP per protocol -Last BP reading was 156/94   Pressure injury, stage I medial buttocks, POA Pressure Injury 03/14/22 Buttocks Medial;Mid Stage 1 -  Intact skin with non-blanchable redness of a localized area usually over a bony prominence. (Active)  03/14/22 2200  Location: Buttocks  Location Orientation: Medial;Mid  Staging: Stage 1 -  Intact skin with non-blanchable redness of a localized area usually over a bony prominence.  Wound Description (Comments):   Present on Admission: Yes   Hypoalbuminemia -Patient's Albumin Level went from 1.9 -> 2.3 -> 2.0 -> 2.0 -> 2.1 -Continue to Monitor and Trend and Repeat CMP in the AM    Hyperbilirubinemia -Patient's T Bili went from 2.0 -> 1.5 -> 1.4 x2 -> 1.6 -Continue to Monitor and Trend -Repeat CMP in the AM    Obesity -Complicates overall prognosis and care -Estimated body mass index is 31.71 kg/m as calculated from the following:   Height as of this encounter: '5\' 3"'$  (1.6 m).   Weight as of this encounter: 81.2 kg.  -Weight Loss  and Dietary Counseling given  DVT prophylaxis: SCDs Start: 03/14/22 2212 Place and maintain sequential compression device Start: 03/14/22 1706    Code Status: DNR Family Communication: No family present at bedside   Disposition Plan:  Level of care: Progressive Status is: Inpatient Remains  inpatient appropriate because: PT OT recommending SNF and she continues to be extremely weak and complains of chest discomfort and rib pain.  Renal function continues to be elevated   Consultants:  Oncology notified of admission as a courtesy via [employer] Team Dr. Bobbye Morton Neurosurgery  Procedures:  CT head CT C-spine 03/14/2022 Chest x-ray 03/14/2022 Renal ultrasound 03/14/2022 Right upper quadrant ultrasound 03/15/2022 CT chest abdomen and pelvis 03/14/2022  Antimicrobials:  Anti-infectives (From admission, onward)    Start     Dose/Rate Route Frequency Ordered Stop   03/15/22 1600  azithromycin (ZITHROMAX) 500 mg in sodium chloride 0.9 % 250 mL IVPB  Status:  Discontinued        500 mg 250 mL/hr over 60 Minutes Intravenous Every 24 hours 03/14/22 2211 03/17/22 1759   03/15/22 1000  cefTRIAXone (ROCEPHIN) 2 g in sodium chloride 0.9 % 100 mL IVPB        2 g 200 mL/hr over 30 Minutes Intravenous Every 24 hours 03/14/22 2211 03/20/22 1520   03/15/22 1000  oseltamivir (TAMIFLU) capsule 30 mg        30 mg Oral Daily 03/15/22 0910 03/19/22 1200   03/14/22 1530  cefTRIAXone (ROCEPHIN) 1 g in sodium chloride 0.9 % 100 mL IVPB        1 g 200 mL/hr over 30 Minutes Intravenous  Once 03/14/22 1515 03/14/22 1606   03/14/22 1530  azithromycin (ZITHROMAX) 500 mg in sodium chloride 0.9 % 250 mL IVPB        500 mg 250 mL/hr over 60 Minutes Intravenous  Once 03/14/22 1515 03/14/22 1717       Subjective: Seen and examined at bedside and she was ambulating with physical therapy and still complains of significant pain in her ribs.  Denies any abdominal discomfort.  Still felt the same as yesterday.  No nausea or vomiting.  No other concerns or complaints at this time.  Objective: Vitals:   03/20/22 0433 03/20/22 0556 03/20/22 0742 03/20/22 1309  BP: (!) 136/53   (!) 156/94  Pulse: 84   (!) 101  Resp: '18  20 18  '$ Temp: 98.2 F (36.8 C)   97.9 F (36.6 C)  TempSrc: Oral   Oral  SpO2:  100%  100% 96%  Weight: 79.1 kg 81.2 kg    Height:        Intake/Output Summary (Last 24 hours) at 03/20/2022 1845 Last data filed at 03/20/2022 0900 Gross per 24 hour  Intake 120 ml  Output --  Net 120 ml   Filed Weights   03/19/22 0503 03/20/22 0433 03/20/22 0556  Weight: 78.9 kg 79.1 kg 81.2 kg   Examination: Physical Exam:  Constitutional: WN/WD obese Caucasian female currently no acute distress appears little uncomfortable Respiratory: Diminished to auscultation bilaterally, no wheezing, rales, rhonchi or crackles. Normal respiratory effort and patient is not tachypenic. No accessory muscle use.  Cardiovascular: RRR, no murmurs / rubs / gallops. S1 and S2 auscultated. No extremity edema. 2+ pedal pulses. No carotid bruits.  Abdomen: Soft, non-tender, distended secondary to body habitus.  Bowel sounds positive.  GU: Deferred. Musculoskeletal: No clubbing / cyanosis of digits/nails. No joint deformity upper and lower extremities.  Skin: No rashes, lesions,  ulcers on limited skin evaluation. No induration; Warm and dry.  Neurologic: CN 2-12 grossly intact with no focal deficits. Romberg sign and cerebellar reflexes not assessed.  Psychiatric: Normal judgment and insight. Alert and oriented x 3. Normal mood and appropriate affect.   Data Reviewed: I have personally reviewed following labs and imaging studies  CBC: Recent Labs  Lab 03/16/22 0512 03/17/22 0423 03/18/22 0443 03/19/22 0403 03/20/22 0445  WBC 2.9* 3.5* 3.7* 3.1* 4.1  NEUTROABS 2.2 2.6 2.3  --  2.7  HGB 8.1* 8.7* 8.8* 8.2* 8.4*  HCT 25.1* 27.7* 27.0* 25.8* 26.3*  MCV 101.2* 103.7* 101.1* 102.4* 102.3*  PLT 50* 59* 77* 78* 92*   Basic Metabolic Panel: Recent Labs  Lab 03/16/22 0512 03/17/22 0423 03/18/22 0443 03/19/22 0403 03/20/22 0445  NA 136 134* 134* 132* 133*  K 3.9 3.8 4.0 3.7 3.7  CL 111 109 108 108 108  CO2 17* 17* 18* 17* 18*  GLUCOSE 92 90 83 84 100*  BUN 47* 46* 42* 42* 40*  CREATININE  2.56* 2.24* 2.17* 1.92* 1.92*  CALCIUM 8.1* 8.1* 8.1* 8.0* 8.3*  MG  --  1.6* 2.5* 2.2 2.1  PHOS  --  3.3  --   --  3.3   GFR: Estimated Creatinine Clearance: 23.6 mL/min (A) (by C-G formula based on SCr of 1.92 mg/dL (H)). Liver Function Tests: Recent Labs  Lab 03/15/22 0427 03/16/22 0512 03/17/22 0423 03/19/22 0403 03/20/22 0445  AST 59* 64* 76* 87* 95*  ALT '25 26 28 27 30  '$ ALKPHOS 79 100 106 129* 139*  BILITOT 2.0* 1.5* 1.4* 1.4* 1.6*  PROT 4.9* 5.3* 5.3* 5.0* 5.0*  ALBUMIN 1.9* 2.3* 2.0* 2.0* 2.1*   No results for input(s): "LIPASE", "AMYLASE" in the last 168 hours. Recent Labs  Lab 03/14/22 1301  AMMONIA 32   Coagulation Profile: No results for input(s): "INR", "PROTIME" in the last 168 hours. Cardiac Enzymes: No results for input(s): "CKTOTAL", "CKMB", "CKMBINDEX", "TROPONINI" in the last 168 hours. BNP (last 3 results) No results for input(s): "PROBNP" in the last 8760 hours. HbA1C: No results for input(s): "HGBA1C" in the last 72 hours. CBG: Recent Labs  Lab 03/16/22 2128 03/17/22 0749 03/17/22 1155 03/17/22 1638 03/17/22 2120  GLUCAP 138* 80 88 98 86   Lipid Profile: No results for input(s): "CHOL", "HDL", "LDLCALC", "TRIG", "CHOLHDL", "LDLDIRECT" in the last 72 hours. Thyroid Function Tests: No results for input(s): "TSH", "T4TOTAL", "FREET4", "T3FREE", "THYROIDAB" in the last 72 hours. Anemia Panel: No results for input(s): "VITAMINB12", "FOLATE", "FERRITIN", "TIBC", "IRON", "RETICCTPCT" in the last 72 hours. Sepsis Labs: Recent Labs  Lab 03/14/22 1301 03/14/22 1720 03/16/22 0512 03/17/22 0423  LATICACIDVEN 4.0* 2.2* 3.1* 2.6*    Recent Results (from the past 240 hour(s))  Resp panel by RT-PCR (RSV, Flu A&B, Covid) Anterior Nasal Swab     Status: Abnormal   Collection Time: 03/14/22 11:12 AM   Specimen: Anterior Nasal Swab  Result Value Ref Range Status   SARS Coronavirus 2 by RT PCR NEGATIVE NEGATIVE Final    Comment: (NOTE) SARS-CoV-2  target nucleic acids are NOT DETECTED.  The SARS-CoV-2 RNA is generally detectable in upper respiratory specimens during the acute phase of infection. The lowest concentration of SARS-CoV-2 viral copies this assay can detect is 138 copies/mL. A negative result does not preclude SARS-Cov-2 infection and should not be used as the sole basis for treatment or other patient management decisions. A negative result may occur with  improper specimen collection/handling,  submission of specimen other than nasopharyngeal swab, presence of viral mutation(s) within the areas targeted by this assay, and inadequate number of viral copies(<138 copies/mL). A negative result must be combined with clinical observations, patient history, and epidemiological information. The expected result is Negative.  Fact Sheet for Patients:  EntrepreneurPulse.com.au  Fact Sheet for Healthcare Providers:  IncredibleEmployment.be  This test is no t yet approved or cleared by the [place] FDA and  has been authorized for detection and/or diagnosis of SARS-CoV-2 by FDA under an Emergency Use Authorization (EUA). This EUA will remain  in effect (meaning this test can be used) for the duration of the COVID-19 declaration under Section 564(b)(1) of the Act, 21 U.S.C.section 360bbb-3(b)(1), unless the authorization is terminated  or revoked sooner.       Influenza A by PCR POSITIVE (A) NEGATIVE Final   Influenza B by PCR NEGATIVE NEGATIVE Final    Comment: (NOTE) The Xpert Xpress SARS-CoV-2/FLU/RSV plus assay is intended as an aid in the diagnosis of influenza from Nasopharyngeal swab specimens and should not be used as a sole basis for treatment. Nasal washings and aspirates are unacceptable for Xpert Xpress SARS-CoV-2/FLU/RSV testing.  Fact Sheet for Patients: EntrepreneurPulse.com.au  Fact Sheet for Healthcare  Providers: IncredibleEmployment.be  This test is not yet approved or cleared by the [place] FDA and has been authorized for detection and/or diagnosis of SARS-CoV-2 by FDA under an Emergency Use Authorization (EUA). This EUA will remain in effect (meaning this test can be used) for the duration of the COVID-19 declaration under Section 564(b)(1) of the Act, 21 U.S.C. section 360bbb-3(b)(1), unless the authorization is terminated or revoked.     Resp Syncytial Virus by PCR NEGATIVE NEGATIVE Final    Comment: (NOTE) Fact Sheet for Patients: EntrepreneurPulse.com.au  Fact Sheet for Healthcare Providers: IncredibleEmployment.be  This test is not yet approved or cleared by the [place] FDA and has been authorized for detection and/or diagnosis of SARS-CoV-2 by FDA under an Emergency Use Authorization (EUA). This EUA will remain in effect (meaning this test can be used) for the duration of the COVID-19 declaration under Section 564(b)(1) of the Act, 21 U.S.C. section 360bbb-3(b)(1), unless the authorization is terminated or revoked.  Performed at [facility], [place] [address] [place], [place] [address]   Culture, blood (Routine X 2) w Reflex to ID Panel     Status: None   Collection Time: 03/14/22  5:20 PM   Specimen: BLOOD  Result Value Ref Range Status   Specimen Description   Final    BLOOD LEFT ANTECUBITAL Performed at [place] [address], [place], [place] [address]    Special Requests   Final    BOTTLES DRAWN AEROBIC AND ANAEROBIC Blood Culture adequate volume Performed at [place] [address] [place], [place] [address]    Culture   Final    NO GROWTH 5 DAYS Performed at [facility], [place] [address], [place], [place] [address]    Report Status 03/19/2022 FINAL  Final  Culture, blood (Routine X 2) w Reflex to ID Panel      Status: None   Collection Time: 03/14/22 10:56 PM   Specimen: BLOOD  Result Value Ref Range Status   Specimen Description   Final    BLOOD BLOOD LEFT ARM Performed at [place] [address], [place], [place] [address]    Special Requests   Final    BOTTLES DRAWN  AEROBIC ONLY Blood Culture adequate volume Performed at [place] [address] [place], [place] [address]    Culture   Final    NO GROWTH 5 DAYS Performed at [facility], [place] [address] [place], [place] [address]    Report Status 03/20/2022 FINAL  Final  Urine Culture     Status: None   Collection Time: 03/15/22  7:53 AM   Specimen: Urine, Clean Catch  Result Value Ref Range Status   Specimen Description   Final    URINE, CLEAN CATCH Performed at [facility], [place] [address] [place], [place] [address]    Special Requests   Final    NONE Performed at [facility], [place] [address], [place], [place] [address]    Culture   Final    NO GROWTH Performed at [facility], [place] [address], [place], [place] [address]    Report Status 03/16/2022 FINAL  Final     Radiology Studies: DG CHEST PORT 1 VIEW  Result Date: 03/20/2022 CLINICAL DATA:  [id number] with shortness of breath. EXAM: PORTABLE CHEST 1 VIEW COMPARISON:  Portable chest and chest CT without contrast both 03/14/2022 FINDINGS: 4:57 a.m. The heart has mildly enlarged in the interval. There is now mild perihilar vascular congestion and lower zonal interstitial edema consistent with CHF or fluid overload. Small pleural effusions have also increased in size, with increasing overlying linear and hazy opacities consistent with atelectasis or pneumonia versus ground-glass edema. The mid and upper lung fields are clear of focal opacities. There are right axillary surgical clips. The mediastinum is normally outlined. There is calcification of the transverse aorta. Degenerative  change thoracic spine. IMPRESSION: 1. Mild cardiomegaly with perihilar vascular congestion and lower zonal interstitial edema consistent with CHF or fluid overload. 2. Small pleural effusions have increased in size, with increasing overlying linear and hazy opacities consistent with atelectasis or pneumonia versus ground-glass edema. 3. Clinical correlation and radiographic follow-up recommended. Electronically Signed   By: Telford Nab M.D.   On: 03/20/2022 07:47    Scheduled Meds:  acetaminophen  1,000 mg Oral Q6H   calcium carbonate  1,250 mg Oral Q breakfast   cholecalciferol  1,000 Units Oral Daily   cyanocobalamin  1,000 mcg Oral Daily   fluticasone  2 spray Each Nare Daily   folic acid  1 mg Oral Daily   guaiFENesin  1,200 mg Oral BID   hydrocortisone   Rectal TID   ipratropium-albuterol  3 mL Nebulization BID   letrozole  2.5 mg Oral Daily   levothyroxine  112 mcg Oral Q0600   loratadine  10 mg Oral Daily   methocarbamol  1,000 mg Oral TID   pantoprazole  40 mg Oral Daily   senna-docusate  1 tablet Oral BID   Continuous Infusions:   LOS: 6 days   Raiford Noble, DO [facility] Available via Epic secure chat 7am-7pm After these hours, please refer to coverage provider listed on [email or web address] 03/20/2022, 6:45 PM

## 2022-03-20 NOTE — Progress Notes (Signed)
Physical Therapy Treatment Patient Details Name: Catherine Decker MRN: [id number] DOB: 1940/04/30 Today's Date: 03/20/2022   History of Present Illness Patient is a 82 year old female who presented after a fall down some stairs. Patient was found to have left rib fractures 4-5, right rib fractures 7-8, T7 compression fx, T1 spinous process fx, SDH.  Pt also found to have influenza A. GEZ:MOQHU breast cancer, arthritis, hyperlipidemia, splenic artery aneurysm    PT Comments    Pt assisted with ambulation however only able to tolerate 10 ft x2 with seated rest break.  Continue to recommend SNF upon d/c.   Recommendations for follow up therapy are one component of a multi-disciplinary discharge planning process, led by the attending physician.  Recommendations may be updated based on patient status, additional functional criteria and insurance authorization.  Follow Up Recommendations  Skilled nursing-short term rehab (<3 hours/day) Can patient physically be transported by private vehicle: Yes   Assistance Recommended at Discharge Intermittent Supervision/Assistance  Patient can return home with the following Assistance with cooking/housework;Assist for transportation;Help with stairs or ramp for entrance   Equipment Recommendations  None recommended by PT    Recommendations for Other Services       Precautions / Restrictions Precautions Precautions: Fall Precaution Comments: rib fractures, back preacutions     Mobility  Bed Mobility Overal bed mobility: Needs Assistance Bed Mobility: Sit to Supine       Sit to supine: Supervision   General bed mobility comments: increased time and effort    Transfers Overall transfer level: Needs assistance Equipment used: Rolling walker (2 wheels) Transfers: Sit to/from Stand Sit to Stand: Min guard           General transfer comment: Min guard for safety. Cues for safety, hand placement.    Ambulation/Gait Ambulation/Gait  assistance: Min guard Gait Distance (Feet): 10 Feet (x2) Assistive device: Rolling walker (2 wheels) Gait Pattern/deviations: Step-through pattern, Decreased stride length, Trunk flexed       General Gait Details: pt ambulated in room, required seated rest break, limited by fatigue and weakness, 10 ft x2   Stairs             Wheelchair Mobility    Modified Rankin (Stroke Patients Only)       Balance                                            Cognition Arousal/Alertness: Awake/alert Behavior During Therapy: WFL for tasks assessed/performed Overall Cognitive Status: Within Functional Limits for tasks assessed                                          Exercises      General Comments        Pertinent Vitals/Pain Pain Assessment Pain Assessment: Faces Faces Pain Scale: Hurts even more Pain Location: ribs with movement Pain Descriptors / Indicators: Discomfort, Grimacing Pain Intervention(s): Repositioned, Monitored during session, Patient requesting pain meds-RN notified    Home Living                          Prior Function            PT Goals (current goals can now be found in the care  plan section) Progress towards PT goals: Progressing toward goals    Frequency    Min 3X/week      PT Plan Current plan remains appropriate    Co-evaluation              AM-PAC PT "6 Clicks" Mobility   Outcome Measure  Help needed turning from your back to your side while in a flat bed without using bedrails?: A Little Help needed moving from lying on your back to sitting on the side of a flat bed without using bedrails?: A Little Help needed moving to and from a bed to a chair (including a wheelchair)?: A Little Help needed standing up from a chair using your arms (e.g., wheelchair or bedside chair)?: A Little Help needed to walk in hospital room?: A Little Help needed climbing 3-5 steps with a railing? : A  Lot 6 Click Score: 17    End of Session   Activity Tolerance: Patient limited by fatigue;Patient tolerated treatment well Patient left: with call bell/phone within reach;in bed   PT Visit Diagnosis: Difficulty in walking, not elsewhere classified (R26.2);Unsteadiness on feet (R26.81)     Time: 0761-5183 PT Time Calculation (min) (ACUTE ONLY): 15 min  Charges:  $Gait Training: 8-22 mins                     Jannette Spanner PT, DPT Physical Therapist Acute Rehabilitation Services Preferred contact method: Secure Chat Weekend Pager Only: [phone] Office: [place] 03/20/2022, 2:27 PM

## 2022-03-21 ENCOUNTER — Inpatient Hospital Stay (HOSPITAL_COMMUNITY): Payer: Medicare HMO

## 2022-03-21 DIAGNOSIS — E039 Hypothyroidism, unspecified: Secondary | ICD-10-CM | POA: Diagnosis not present

## 2022-03-21 DIAGNOSIS — E8770 Fluid overload, unspecified: Secondary | ICD-10-CM

## 2022-03-21 DIAGNOSIS — W19XXXD Unspecified fall, subsequent encounter: Secondary | ICD-10-CM | POA: Diagnosis not present

## 2022-03-21 DIAGNOSIS — S22008A Other fracture of unspecified thoracic vertebra, initial encounter for closed fracture: Secondary | ICD-10-CM | POA: Diagnosis not present

## 2022-03-21 DIAGNOSIS — N179 Acute kidney failure, unspecified: Secondary | ICD-10-CM | POA: Diagnosis not present

## 2022-03-21 LAB — CBC WITH DIFFERENTIAL/PLATELET
Abs Immature Granulocytes: 0.32 10*3/uL — ABNORMAL HIGH (ref 0.00–0.07)
Basophils Absolute: 0 10*3/uL (ref 0.0–0.1)
Basophils Relative: 1 %
Eosinophils Absolute: 0.1 10*3/uL (ref 0.0–0.5)
Eosinophils Relative: 2 %
HCT: 24.7 % — ABNORMAL LOW (ref 36.0–46.0)
Hemoglobin: 8.1 g/dL — ABNORMAL LOW (ref 12.0–15.0)
Immature Granulocytes: 7 %
Lymphocytes Relative: 12 %
Lymphs Abs: 0.5 10*3/uL — ABNORMAL LOW (ref 0.7–4.0)
MCH: 33.3 pg (ref 26.0–34.0)
MCHC: 32.8 g/dL (ref 30.0–36.0)
MCV: 101.6 fL — ABNORMAL HIGH (ref 80.0–100.0)
Monocytes Absolute: 0.3 10*3/uL (ref 0.1–1.0)
Monocytes Relative: 6 %
Neutro Abs: 3.1 10*3/uL (ref 1.7–7.7)
Neutrophils Relative %: 72 %
Platelets: 90 10*3/uL — ABNORMAL LOW (ref 150–400)
RBC: 2.43 MIL/uL — ABNORMAL LOW (ref 3.87–5.11)
RDW: 16.5 % — ABNORMAL HIGH (ref 11.5–15.5)
WBC: 4.4 10*3/uL (ref 4.0–10.5)
nRBC: 0 % (ref 0.0–0.2)

## 2022-03-21 LAB — COMPREHENSIVE METABOLIC PANEL
ALT: 30 U/L (ref 0–44)
AST: 101 U/L — ABNORMAL HIGH (ref 15–41)
Albumin: 1.9 g/dL — ABNORMAL LOW (ref 3.5–5.0)
Alkaline Phosphatase: 145 U/L — ABNORMAL HIGH (ref 38–126)
Anion gap: 8 (ref 5–15)
BUN: 40 mg/dL — ABNORMAL HIGH (ref 8–23)
CO2: 17 mmol/L — ABNORMAL LOW (ref 22–32)
Calcium: 8.2 mg/dL — ABNORMAL LOW (ref 8.9–10.3)
Chloride: 108 mmol/L (ref 98–111)
Creatinine, Ser: 1.77 mg/dL — ABNORMAL HIGH (ref 0.44–1.00)
GFR, Estimated: 29 mL/min — ABNORMAL LOW (ref >60)
Glucose, Bld: 95 mg/dL (ref 70–99)
Potassium: 3.6 mmol/L (ref 3.5–5.1)
Sodium: 133 mmol/L — ABNORMAL LOW (ref 135–145)
Total Bilirubin: 1.7 mg/dL — ABNORMAL HIGH (ref 0.3–1.2)
Total Protein: 4.8 g/dL — ABNORMAL LOW (ref 6.5–8.1)

## 2022-03-21 LAB — PHOSPHORUS: Phosphorus: 3.2 mg/dL (ref 2.5–4.6)

## 2022-03-21 LAB — MAGNESIUM: Magnesium: 2 mg/dL (ref 1.7–2.4)

## 2022-03-21 LAB — BRAIN NATRIURETIC PEPTIDE: B Natriuretic Peptide: 40.5 pg/mL (ref 0.0–100.0)

## 2022-03-21 MED ORDER — SODIUM BICARBONATE 650 MG PO TABS
650.0000 mg | ORAL_TABLET | Freq: Three times a day (TID) | ORAL | Status: AC
  Administered 2022-03-21 – 2022-03-23 (×5): 650 mg via ORAL
  Filled 2022-03-21 (×6): qty 1

## 2022-03-21 MED ORDER — FUROSEMIDE 10 MG/ML IJ SOLN
40.0000 mg | Freq: Once | INTRAMUSCULAR | Status: AC
  Administered 2022-03-21: 40 mg via INTRAVENOUS
  Filled 2022-03-21: qty 4

## 2022-03-21 NOTE — Progress Notes (Signed)
Mobility Specialist - Progress Note   03/21/22 1049  Mobility  Activity Ambulated with assistance in room  Level of Assistance Minimal assist, patient does 75% or more  Assistive Device Front wheel walker  Distance Ambulated (ft) 13 ft  Range of Motion/Exercises Active  Activity Response Tolerated well  Mobility Referral Yes  $Mobility charge 1 Mobility   Pt was found in bed and agreeable to ambulate in room. Pt was min-A to go from lying to sitting and contact assist during ambulation. At EOS was left on recliner chair with all necessities in reach and chair alarm on. RN notified of session.  Ferd Hibbs Mobility Specialist

## 2022-03-21 NOTE — TOC Progression Note (Addendum)
Transition of Care [facility]) - Progression Note    Patient Details  Name: Catherine Decker MRN: [id number] Date of Birth: 1941/03/05  [facility] CM/SW [place], LCSW Phone Number: 03/21/2022, 1:37 PM  Clinical Narrative:    Pt is not stable for d/c. Pt's Josem Kaufmann has been approved with an end date of 03/25/22. Authorization may need to be resubmitted if pt is not stable to d/c on 1/16.    ADDEN 2:35pm CSW confirmed Pt can be discharged to [facility] if stable this weekend. Please call [facility]([phone]) for admission. TOC to follow.  Expected Discharge Plan: [place] Barriers to Discharge: Continued Medical Work up  Expected Discharge Plan and Services       Living arrangements for the past 2 months: Single Family Home                                       Social Determinants of Health (SDOH) Interventions SDOH Screenings   Food Insecurity: No Food Insecurity (03/14/2022)  Housing: Low Risk  (03/14/2022)  Transportation Needs: No Transportation Needs (03/14/2022)  Utilities: Not At Risk (03/14/2022)  Tobacco Use: Low Risk  (03/14/2022)    Readmission Risk Interventions     No data to display

## 2022-03-21 NOTE — Progress Notes (Signed)
PROGRESS NOTE    Catherine Decker  HWE:[id number] DOB: September 24, 1940 DOA: 03/14/2022 PCP: Antony Contras, MD   Brief Narrative:  The Patient is an obese 82 year old female history of hypothyroidism, hypertension, as well as other comorbidities who presented to the ED after she fell down the stairs 2 days prior to admission slipped while walking hit her head on the way down but did not suffer any LOC. Patient was able to get up with help from a friend however decided to stay home. Patient's daughter noted that patient had more confusion and fatigue over the past 2 days prior to admission with decreased appetite and a 1-1/2-week history of cough and congestion and subsequently brought to the ED as patient with no clinical improvement.    Patient was seen in the ED, workup done concerning for subdural hematoma, chronic hygromas, multiple rib fractures, pneumonia, influenza A, AKI on CKD stage IIIb. Neurosurgery reviewed films and felt no further workup needed intervention at this time. Trauma surgery also assessed the patient and recommended pain management, pulmonary toileting. Patient admitted placed on empiric IV antibiotics and Tamiflu.  She is subsequently improving but renal function continues to be elevated and now slowly improving.  Will need to ensure adequate pain control.  PT OT still recommending SNF and will discharge when she is little bit more improved from a lab perspective as she is agreeable to SNF.  Assessment and Plan:  Fall with associated multiple rib fractures Spinous process fracture of T1 Subdural hematoma/chronic hygromas -Secondary to mechanical fall. -Patient assessed by the trauma team who reviewed imaging and recommended pain management, pulmonary toileting, incentive spirometry use secondary to rib fractures to prevent worsening pneumonia and no further intervention at this time. -Continue incentive spirometry, flutter valve, treatment of pneumonia, pain management with  Methocarbamol 1000 mg po TID, scheduled Acetaminophen 1000 mg q6h. Oxycodone IR 2.5-5 mg q4prn Moderate to Severe Pain. -Films reviewed by neurosurgery who feel no intervention needed at this time. -PT/OT recommending SNF and patient is agreeable to go to SNF   Influenza A -Initiated Tamiflu and completed 5 Days -C/w Supportive Care    Right-sided Pneumonia -Noted on CT chest. -Patient with fall with rib fractures, likely can take deep inspiratory effort. -Urine strep pneumococcus antigen positive. -SpO2: 95 % -Urine Legionella antigen still pending. -Continue IV Rocephin to complete a 5-day course of treatment. -Discontinued IV Azithromycin. -Continue scheduled DuoNebs BID and q4hprn, Flonase, Claritin, PPI, Mucinex. -Repeat CXR in the AM as CXR this AM done and showed "Similar small bilateral pleural effusions.Low lung volumes with similar bibasilar and right lower linear opacities, likely atelectasis."   Hypothyroidism -Continue home regimen of Levothyroxine 112 mcg po Daily.    AKI on CKD stage IIIb Metabolic Acidosis -Initially secondary to volume depletion, dehydration in the setting of ARB. -Renal ultrasound negative for hydronephrosis. -Urinalysis with concerns for UTI. -Urine sodium of 13, urine creatinine 195. -BUN/Cr Trend: Recent Labs  Lab 03/15/22 0427 03/16/22 0512 03/17/22 0423 03/18/22 0443 03/19/22 0403 03/20/22 0445 03/21/22 0405  BUN 43* 47* 46* 42* 42* 40* 40*  CREATININE 2.51* 2.56* 2.24* 2.17* 1.92* 1.92* 1.77*  -IVF Hydration has stopped currently and she had a low bit of fluid on her show give her a little bit of Lasix -Patient still has a slight metabolic acidosis with a CO2 of 17, anion gap of 8, chloride level 108 -Increased Sodium Bicarbonate 650 mg po TID  -Continue to hold ARB, avoid nephrotoxins.   -Monitor urine output.   -  Likely will not resume ARB on discharge. -Avoid Further Nephrotoxic Medications, Contrast Dyes, Hypotension and  Dehydration to Ensure Adequate Renal Perfusion and will need to Renally Adjust Meds -Continue to Monitor and Trend Renal Function carefully and repeat CMP in the AM    UTI -Urinalysis concerning for UTI as it showed a hazy appearance with amber color urine, moderate leukocytes, negative nitrites, many bacteria, 0-5 RBCs per high-power field, 21-50 WBCs. -Urine cultures negative.  -Patient empirically on IV Rocephin secondary to right-sided pneumonia.   Hyperlipidemia -Continue to hold home regimen for now  Volume Overload -Check BNP and ECHO -Given a dose of IV Lasix 40 mg x1 -Patient is +9.429 Liters  -Has bilateral Pleural Effusions -Had 1+ LE edema -Continue to Monitor for S/Sx of Volume Overload  Hyponatremia -Patient's Na+ Trend: Recent Labs  Lab 03/15/22 0427 03/16/22 0512 03/17/22 0423 03/18/22 0443 03/19/22 0403 03/20/22 0445 03/21/22 0405  NA 138 136 134* 134* 132* 133* 133*  -Continue to Monitor and Trend and Repeat CMP in the AM    History of Breast Cancer Status postmastectomy/spinal lesions -Imaging with T1 a right scapular lesion concerning for Metastasis . -Patient's primary oncologist, Dr. Lorenso Courier has been notified via epic of patient's admission. -Continue home regimen with Letrozole 2.5 mg po Daily.    -Oncology notified via epic of admission but likely will need outpatient follow up    Abnormal LFTs/Transaminitis -Likely secondary to history of cirrhosis. -Right upper quadrant ultrasound consistent with cirrhosis with portal hypertension, low-density structure in the left lobe of the liver highlighted on prior noncontrast CTs up oropharynx. -LFTs trending down from admission and close to baseline as Trend shows: Recent Labs  Lab 03/14/22 1301 03/15/22 0427 03/16/22 0512 03/17/22 0423 03/19/22 0403 03/20/22 0445 03/21/22 0405  AST 88* 59* 64* 76* 87* 95* 101*  ALT 32 '25 26 28 27 30 30  '$ -Outpatient follow-up.   History of Cirrhosis -Outpatient  follow-up with primary hepatologist. -Explains elevated T Bili and AST -T. bili has gone from 1.4 -> 1.6 ->  1.7 -She had a little bit of fluid in her legs so we will give her a dose of Lasix -Continue to Monitor and Trend   Pancytopenia -Patient with a pancytopenia being followed by hematology/oncology in the outpatient setting. -CBC Trend: Recent Labs  Lab 03/15/22 0427 03/16/22 0512 03/17/22 0423 03/18/22 0443 03/19/22 0403 03/20/22 0445 03/21/22 0405  WBC 2.6* 2.9* 3.5* 3.7* 3.1* 4.1 4.4  HGB 7.5* 8.1* 8.7* 8.8* 8.2* 8.4* 8.1*  HCT 22.5* 25.1* 27.7* 27.0* 25.8* 26.3* 24.7*  MCV 100.0 101.2* 103.7* 101.1* 102.4* 102.3* 101.6*  PLT 40* 50* 59* 77* 78* 92* 90*  -Worsening thrombocytopenia and now patient with a leukopenia unchanged from prior leukopenia likely due to acute infection. -Thrombocytopenia and leukopenia improving daily. -Patient with no S/Sx of bleeding. -Hematology/oncology informed of admission via epic. -Will continue to Monitor and Trend    Lactic Acidosis -Likely secondary to acute infection of pneumonia and influenza A. -Lactic acid levels trending down and last check was 2.6 -The antibiotics and IV fluid have now stopped -Repeat labs in the AM.   Diabetes Mellitus Type 2 -Hemoglobin A1c 5.0 (11/05/2020). -CBG's ranging from 80-138 -Hemoglobin A1c 4.9.   -Continue to hold oral hypoglycemic agents.  -Discontinued SSI, CBGs.  -Outpatient follow-up.    Hypertension -BP stable and had been somewhat soft. -Continue to hold home regimen antihypertensive medications.   -IV fluids have now stopped  -Continue to Monitor BP per protocol -  Last BP reading was 148/58   Pressure injury, stage I medial buttocks, POA Pressure Injury 03/14/22 Buttocks Medial;Mid Stage 1 -  Intact skin with non-blanchable redness of a localized area usually over a bony prominence. (Active)  03/14/22 2200  Location: Buttocks  Location Orientation: Medial;Mid  Staging: Stage 1 -   Intact skin with non-blanchable redness of a localized area usually over a bony prominence.  Wound Description (Comments):   Present on Admission: Yes   Hypoalbuminemia -Patient's Albumin Level went from 1.9 -> 2.3 -> 2.0 -> 2.0 -> 2.1 -> 1.9 -Continue to Monitor and Trend and Repeat CMP in the AM    Hyperbilirubinemia -Patient's T Bili went from 2.0 -> 1.5 -> 1.4 x2 -> 1.6 -> 1.7 -Continue to Monitor and Trend -Repeat CMP in the AM    Obesity -Complicates overall prognosis and care -Estimated body mass index is 30.03 kg/m as calculated from the following:   Height as of this encounter: '5\' 3"'$  (1.6 m).   Weight as of this encounter: 76.9 kg.  -Weight Loss and Dietary Counseling given  DVT prophylaxis: SCDs Start: 03/14/22 2212 Place and maintain sequential compression device Start: 03/14/22 1706    Code Status: DNR Family Communication: No family present at bedside   Disposition Plan:  Level of care: Progressive Status is: Inpatient Remains inpatient appropriate because: Needs SNF and a little further clinical improvement in her Metabolic Acidosis    Consultants:  Oncology notified of admission as a courtesy via [employer] Team Dr. Bobbye Morton Neurosurgery  Procedures:  CT head CT C-spine 03/14/2022 Chest x-ray 03/14/2022 Renal ultrasound 03/14/2022 Right upper quadrant ultrasound 03/15/2022 CT chest abdomen and pelvis 03/14/2022  Antimicrobials:  Anti-infectives (From admission, onward)    Start     Dose/Rate Route Frequency Ordered Stop   03/15/22 1600  azithromycin (ZITHROMAX) 500 mg in sodium chloride 0.9 % 250 mL IVPB  Status:  Discontinued        500 mg 250 mL/hr over 60 Minutes Intravenous Every 24 hours 03/14/22 2211 03/17/22 1759   03/15/22 1000  cefTRIAXone (ROCEPHIN) 2 g in sodium chloride 0.9 % 100 mL IVPB        2 g 200 mL/hr over 30 Minutes Intravenous Every 24 hours 03/14/22 2211 03/20/22 1520   03/15/22 1000  oseltamivir (TAMIFLU) capsule 30 mg         30 mg Oral Daily 03/15/22 0910 03/19/22 1200   03/14/22 1530  cefTRIAXone (ROCEPHIN) 1 g in sodium chloride 0.9 % 100 mL IVPB        1 g 200 mL/hr over 30 Minutes Intravenous  Once 03/14/22 1515 03/14/22 1606   03/14/22 1530  azithromycin (ZITHROMAX) 500 mg in sodium chloride 0.9 % 250 mL IVPB        500 mg 250 mL/hr over 60 Minutes Intravenous  Once 03/14/22 1515 03/14/22 1717       Subjective: Seen and examined at bedside and she was ambulating with the mobility tech and was going from the bed to the chair.  She continues to have significant pain and denies abdominal discomfort.  Getting up and moving to the bedside commode but she still feels very weak.  No nausea or vomiting.  No other concerns or complaints at this time.  Objective: Vitals:   03/20/22 2026 03/20/22 2126 03/21/22 0500 03/21/22 0539  BP:  127/79  (!) 148/58  Pulse:  92  85  Resp:  20  18  Temp:  98.5 F (36.9 C)  98 F (36.7 C)  TempSrc:  Oral  Oral  SpO2: 96% 97%  95%  Weight:   76.9 kg   Height:        Intake/Output Summary (Last 24 hours) at 03/21/2022 1334 Last data filed at 03/21/2022 0900 Gross per 24 hour  Intake 481 ml  Output --  Net 481 ml   Filed Weights   03/20/22 0433 03/20/22 0556 03/21/22 0500  Weight: 79.1 kg 81.2 kg 76.9 kg   Examination: Physical Exam:  Constitutional: WN/WD obese Caucasian female who appears a little uncomfortable and fatigued Respiratory: Diminished to auscultation bilaterally, no wheezing, rales, rhonchi or crackles. Normal respiratory effort and patient is not tachypenic. No accessory muscle use.  Cardiovascular: RRR, no murmurs / rubs / gallops. S1 and S2 auscultated.  1+ lower extremity edema Abdomen: Soft, non-tender, distended secondary to body habitus.  Bowel sounds positive.  GU: Deferred. Musculoskeletal: No clubbing / cyanosis of digits/nails. No joint deformity upper and lower extremities.  Skin: No rashes, lesions, ulcers on limited skin  evaluation. No induration; Warm and dry.  Neurologic: CN 2-12 grossly intact with no focal deficits. Romberg sign and cerebellar reflexes not assessed.  Psychiatric: Normal judgment and insight. Alert and oriented x 3. Normal mood and appropriate affect.   Data Reviewed: I have personally reviewed following labs and imaging studies  CBC: Recent Labs  Lab 03/16/22 0512 03/17/22 0423 03/18/22 0443 03/19/22 0403 03/20/22 0445 03/21/22 0405  WBC 2.9* 3.5* 3.7* 3.1* 4.1 4.4  NEUTROABS 2.2 2.6 2.3  --  2.7 3.1  HGB 8.1* 8.7* 8.8* 8.2* 8.4* 8.1*  HCT 25.1* 27.7* 27.0* 25.8* 26.3* 24.7*  MCV 101.2* 103.7* 101.1* 102.4* 102.3* 101.6*  PLT 50* 59* 77* 78* 92* 90*   Basic Metabolic Panel: Recent Labs  Lab 03/17/22 0423 03/18/22 0443 03/19/22 0403 03/20/22 0445 03/21/22 0405  NA 134* 134* 132* 133* 133*  K 3.8 4.0 3.7 3.7 3.6  CL 109 108 108 108 108  CO2 17* 18* 17* 18* 17*  GLUCOSE 90 83 84 100* 95  BUN 46* 42* 42* 40* 40*  CREATININE 2.24* 2.17* 1.92* 1.92* 1.77*  CALCIUM 8.1* 8.1* 8.0* 8.3* 8.2*  MG 1.6* 2.5* 2.2 2.1 2.0  PHOS 3.3  --   --  3.3 3.2   GFR: Estimated Creatinine Clearance: 24.9 mL/min (A) (by C-G formula based on SCr of 1.77 mg/dL (H)). Liver Function Tests: Recent Labs  Lab 03/16/22 0512 03/17/22 0423 03/19/22 0403 03/20/22 0445 03/21/22 0405  AST 64* 76* 87* 95* 101*  ALT '26 28 27 30 30  '$ ALKPHOS 100 106 129* 139* 145*  BILITOT 1.5* 1.4* 1.4* 1.6* 1.7*  PROT 5.3* 5.3* 5.0* 5.0* 4.8*  ALBUMIN 2.3* 2.0* 2.0* 2.1* 1.9*   No results for input(s): "LIPASE", "AMYLASE" in the last 168 hours. No results for input(s): "AMMONIA" in the last 168 hours. Coagulation Profile: No results for input(s): "INR", "PROTIME" in the last 168 hours. Cardiac Enzymes: No results for input(s): "CKTOTAL", "CKMB", "CKMBINDEX", "TROPONINI" in the last 168 hours. BNP (last 3 results) No results for input(s): "PROBNP" in the last 8760 hours. HbA1C: No results for input(s):  "HGBA1C" in the last 72 hours. CBG: Recent Labs  Lab 03/16/22 2128 03/17/22 0749 03/17/22 1155 03/17/22 1638 03/17/22 2120  GLUCAP 138* 80 88 98 86   Lipid Profile: No results for input(s): "CHOL", "HDL", "LDLCALC", "TRIG", "CHOLHDL", "LDLDIRECT" in the last 72 hours. Thyroid Function Tests: No results for input(s): "TSH", "T4TOTAL", "FREET4", "T3FREE", "THYROIDAB"  in the last 72 hours. Anemia Panel: No results for input(s): "VITAMINB12", "FOLATE", "FERRITIN", "TIBC", "IRON", "RETICCTPCT" in the last 72 hours. Sepsis Labs: Recent Labs  Lab 03/14/22 1720 03/16/22 0512 03/17/22 0423  LATICACIDVEN 2.2* 3.1* 2.6*    Recent Results (from the past 240 hour(s))  Resp panel by RT-PCR (RSV, Flu A&B, Covid) Anterior Nasal Swab     Status: Abnormal   Collection Time: 03/14/22 11:12 AM   Specimen: Anterior Nasal Swab  Result Value Ref Range Status   SARS Coronavirus 2 by RT PCR NEGATIVE NEGATIVE Final    Comment: (NOTE) SARS-CoV-2 target nucleic acids are NOT DETECTED.  The SARS-CoV-2 RNA is generally detectable in upper respiratory specimens during the acute phase of infection. The lowest concentration of SARS-CoV-2 viral copies this assay can detect is 138 copies/mL. A negative result does not preclude SARS-Cov-2 infection and should not be used as the sole basis for treatment or other patient management decisions. A negative result may occur with  improper specimen collection/handling, submission of specimen other than nasopharyngeal swab, presence of viral mutation(s) within the areas targeted by this assay, and inadequate number of viral copies(<138 copies/mL). A negative result must be combined with clinical observations, patient history, and epidemiological information. The expected result is Negative.  Fact Sheet for Patients:  EntrepreneurPulse.com.au  Fact Sheet for Healthcare Providers:  IncredibleEmployment.be  This test is no t  yet approved or cleared by the [place] FDA and  has been authorized for detection and/or diagnosis of SARS-CoV-2 by FDA under an Emergency Use Authorization (EUA). This EUA will remain  in effect (meaning this test can be used) for the duration of the COVID-19 declaration under Section 564(b)(1) of the Act, 21 U.S.C.section 360bbb-3(b)(1), unless the authorization is terminated  or revoked sooner.       Influenza A by PCR POSITIVE (A) NEGATIVE Final   Influenza B by PCR NEGATIVE NEGATIVE Final    Comment: (NOTE) The Xpert Xpress SARS-CoV-2/FLU/RSV plus assay is intended as an aid in the diagnosis of influenza from Nasopharyngeal swab specimens and should not be used as a sole basis for treatment. Nasal washings and aspirates are unacceptable for Xpert Xpress SARS-CoV-2/FLU/RSV testing.  Fact Sheet for Patients: EntrepreneurPulse.com.au  Fact Sheet for Healthcare Providers: IncredibleEmployment.be  This test is not yet approved or cleared by the [place] FDA and has been authorized for detection and/or diagnosis of SARS-CoV-2 by FDA under an Emergency Use Authorization (EUA). This EUA will remain in effect (meaning this test can be used) for the duration of the COVID-19 declaration under Section 564(b)(1) of the Act, 21 U.S.C. section 360bbb-3(b)(1), unless the authorization is terminated or revoked.     Resp Syncytial Virus by PCR NEGATIVE NEGATIVE Final    Comment: (NOTE) Fact Sheet for Patients: EntrepreneurPulse.com.au  Fact Sheet for Healthcare Providers: IncredibleEmployment.be  This test is not yet approved or cleared by the [place] FDA and has been authorized for detection and/or diagnosis of SARS-CoV-2 by FDA under an Emergency Use Authorization (EUA). This EUA will remain in effect (meaning this test can be used) for the duration of the COVID-19 declaration under Section  564(b)(1) of the Act, 21 U.S.C. section 360bbb-3(b)(1), unless the authorization is terminated or revoked.  Performed at [facility], [place] [address] [place], [place] [address]   Culture, blood (Routine X 2) w Reflex to ID Panel     Status: None   Collection Time: 03/14/22  5:20 PM   Specimen: BLOOD  Result Value Ref Range Status   Specimen Description   Final    BLOOD LEFT ANTECUBITAL Performed at [facility] [place] [address], [place], [place] [address]    Special Requests   Final    BOTTLES DRAWN AEROBIC AND ANAEROBIC Blood Culture adequate volume Performed at [place] [address] [place], [place] [address]    Culture   Final    NO GROWTH 5 DAYS Performed at [facility], [place] [address] [place], [place] [address]    Report Status 03/19/2022 FINAL  Final  Culture, blood (Routine X 2) w Reflex to ID Panel     Status: None   Collection Time: 03/14/22 10:56 PM   Specimen: BLOOD  Result Value Ref Range Status   Specimen Description   Final    BLOOD BLOOD LEFT ARM Performed at [facility] [address], [place], [place] [address]    Special Requests   Final    BOTTLES DRAWN AEROBIC ONLY Blood Culture adequate volume Performed at [facility] [address] [place], [place] [address]    Culture   Final    NO GROWTH 5 DAYS Performed at [facility], [place] [address], [place], [place] [address]    Report Status 03/20/2022 FINAL  Final  Urine Culture     Status: None   Collection Time: 03/15/22  7:53 AM   Specimen: Urine, Clean Catch  Result Value Ref Range Status   Specimen Description   Final    URINE, CLEAN CATCH Performed at [facility], [place] [address] [place], [place] [address]    Special Requests   Final    NONE Performed at [facility], [place] [address], [place], [place] [address]    Culture   Final    NO  GROWTH Performed at [facility], [place] [address] [place],  [address]    Report Status 03/16/2022 FINAL  Final     Radiology Studies: DG CHEST PORT 1 VIEW  Result Date: 03/21/2022 CLINICAL DATA:  Shortness of breath EXAM: PORTABLE CHEST 1 VIEW COMPARISON:  Chest radiograph dated 03/20/2022 FINDINGS: Low lung volumes with similar bibasilar and right lower linear opacities. Similar small bilateral pleural effusions. No pneumothorax. Similar cardiomediastinal silhouette. The visualized skeletal structures are unremarkable. IMPRESSION: 1. Similar small bilateral pleural effusions. 2. Low lung volumes with similar bibasilar and right lower linear opacities, likely atelectasis. Electronically Signed   By: Darrin Nipper M.D.   On: 03/21/2022 08:36   DG CHEST PORT 1 VIEW  Result Date: 03/20/2022 CLINICAL DATA:  [id number] with shortness of breath. EXAM: PORTABLE CHEST 1 VIEW COMPARISON:  Portable chest and chest CT without contrast both 03/14/2022 FINDINGS: 4:57 a.m. The heart has mildly enlarged in the interval. There is now mild perihilar vascular congestion and lower zonal interstitial edema consistent with CHF or fluid overload. Small pleural effusions have also increased in size, with increasing overlying linear and hazy opacities consistent with atelectasis or pneumonia versus ground-glass edema. The mid and upper lung fields are clear of focal opacities. There are right axillary surgical clips. The mediastinum is normally outlined. There is calcification of the transverse aorta. Degenerative change thoracic spine. IMPRESSION: 1. Mild cardiomegaly with perihilar vascular congestion and lower zonal interstitial edema consistent with CHF or fluid overload. 2. Small pleural effusions have increased in size, with increasing overlying linear and hazy opacities consistent with atelectasis or pneumonia versus ground-glass  edema. 3. Clinical correlation and radiographic follow-up recommended. Electronically  Signed   By: Telford Nab M.D.   On: 03/20/2022 07:47    Scheduled Meds:  acetaminophen  1,000 mg Oral Q6H   calcium carbonate  1,250 mg Oral Q breakfast   cholecalciferol  1,000 Units Oral Daily   cyanocobalamin  1,000 mcg Oral Daily   fluticasone  2 spray Each Nare Daily   folic acid  1 mg Oral Daily   guaiFENesin  1,200 mg Oral BID   hydrocortisone   Rectal TID   ipratropium-albuterol  3 mL Nebulization BID   letrozole  2.5 mg Oral Daily   levothyroxine  112 mcg Oral Q0600   loratadine  10 mg Oral Daily   methocarbamol  1,000 mg Oral TID   pantoprazole  40 mg Oral Daily   senna-docusate  1 tablet Oral BID   sodium bicarbonate  650 mg Oral TID   Continuous Infusions:   LOS: 7 days   Raiford Noble, DO [facility] Available via Epic secure chat 7am-7pm After these hours, please refer to coverage provider listed on [email or web address] 03/21/2022, 1:34 PM

## 2022-03-22 ENCOUNTER — Inpatient Hospital Stay (HOSPITAL_COMMUNITY): Payer: Medicare HMO

## 2022-03-22 DIAGNOSIS — R7881 Bacteremia: Secondary | ICD-10-CM

## 2022-03-22 DIAGNOSIS — S22008A Other fracture of unspecified thoracic vertebra, initial encounter for closed fracture: Secondary | ICD-10-CM | POA: Diagnosis not present

## 2022-03-22 DIAGNOSIS — E039 Hypothyroidism, unspecified: Secondary | ICD-10-CM | POA: Diagnosis not present

## 2022-03-22 DIAGNOSIS — W19XXXD Unspecified fall, subsequent encounter: Secondary | ICD-10-CM | POA: Diagnosis not present

## 2022-03-22 DIAGNOSIS — N179 Acute kidney failure, unspecified: Secondary | ICD-10-CM | POA: Diagnosis not present

## 2022-03-22 LAB — COMPREHENSIVE METABOLIC PANEL
ALT: 38 U/L (ref 0–44)
AST: 173 U/L — ABNORMAL HIGH (ref 15–41)
Albumin: 2.2 g/dL — ABNORMAL LOW (ref 3.5–5.0)
Alkaline Phosphatase: 171 U/L — ABNORMAL HIGH (ref 38–126)
Anion gap: 8 (ref 5–15)
BUN: 39 mg/dL — ABNORMAL HIGH (ref 8–23)
CO2: 18 mmol/L — ABNORMAL LOW (ref 22–32)
Calcium: 8.4 mg/dL — ABNORMAL LOW (ref 8.9–10.3)
Chloride: 107 mmol/L (ref 98–111)
Creatinine, Ser: 1.95 mg/dL — ABNORMAL HIGH (ref 0.44–1.00)
GFR, Estimated: 26 mL/min — ABNORMAL LOW (ref >60)
Glucose, Bld: 90 mg/dL (ref 70–99)
Potassium: 3.4 mmol/L — ABNORMAL LOW (ref 3.5–5.1)
Sodium: 133 mmol/L — ABNORMAL LOW (ref 135–145)
Total Bilirubin: 1.7 mg/dL — ABNORMAL HIGH (ref 0.3–1.2)
Total Protein: 5.2 g/dL — ABNORMAL LOW (ref 6.5–8.1)

## 2022-03-22 LAB — CBC WITH DIFFERENTIAL/PLATELET
Abs Immature Granulocytes: 0.29 10*3/uL — ABNORMAL HIGH (ref 0.00–0.07)
Basophils Absolute: 0 10*3/uL (ref 0.0–0.1)
Basophils Relative: 1 %
Eosinophils Absolute: 0.1 10*3/uL (ref 0.0–0.5)
Eosinophils Relative: 3 %
HCT: 25.2 % — ABNORMAL LOW (ref 36.0–46.0)
Hemoglobin: 8.2 g/dL — ABNORMAL LOW (ref 12.0–15.0)
Immature Granulocytes: 5 %
Lymphocytes Relative: 10 %
Lymphs Abs: 0.6 10*3/uL — ABNORMAL LOW (ref 0.7–4.0)
MCH: 33.5 pg (ref 26.0–34.0)
MCHC: 32.5 g/dL (ref 30.0–36.0)
MCV: 102.9 fL — ABNORMAL HIGH (ref 80.0–100.0)
Monocytes Absolute: 0.3 10*3/uL (ref 0.1–1.0)
Monocytes Relative: 5 %
Neutro Abs: 4.3 10*3/uL (ref 1.7–7.7)
Neutrophils Relative %: 76 %
Platelets: 109 10*3/uL — ABNORMAL LOW (ref 150–400)
RBC: 2.45 MIL/uL — ABNORMAL LOW (ref 3.87–5.11)
RDW: 17.4 % — ABNORMAL HIGH (ref 11.5–15.5)
WBC: 5.6 10*3/uL (ref 4.0–10.5)
nRBC: 0 % (ref 0.0–0.2)

## 2022-03-22 LAB — PHOSPHORUS: Phosphorus: 3.6 mg/dL (ref 2.5–4.6)

## 2022-03-22 LAB — ECHOCARDIOGRAM COMPLETE
Area-P 1/2: 3.42 cm2
Height: 63 in
S' Lateral: 2.25 cm
Weight: 2740.76 oz

## 2022-03-22 LAB — MAGNESIUM: Magnesium: 1.9 mg/dL (ref 1.7–2.4)

## 2022-03-22 MED ORDER — POTASSIUM CHLORIDE CRYS ER 20 MEQ PO TBCR
40.0000 meq | EXTENDED_RELEASE_TABLET | Freq: Two times a day (BID) | ORAL | Status: AC
  Administered 2022-03-22 (×2): 40 meq via ORAL
  Filled 2022-03-22 (×2): qty 2

## 2022-03-22 MED ORDER — FUROSEMIDE 10 MG/ML IJ SOLN
40.0000 mg | Freq: Once | INTRAMUSCULAR | Status: AC
  Administered 2022-03-22: 40 mg via INTRAVENOUS
  Filled 2022-03-22: qty 4

## 2022-03-22 NOTE — Progress Notes (Signed)
Mobility Specialist - Progress Note   03/22/22 1116  Mobility  Activity Transferred to/from [facility];Ambulated with assistance in hallway  Level of Assistance Contact guard assist, steadying assist  Assistive Device Front wheel walker  Distance Ambulated (ft) 50 ft  Range of Motion/Exercises Active  Activity Response Tolerated well  Mobility Referral Yes  $Mobility charge 1 Mobility   Pt was found in bed and agreeable to ambulate. Was assisted to [facility] prior to ambulation in hallway. She still fatigues quickly and at EOS returned to recliner chair with necessities in reach. Chair alarm on.  Ferd Hibbs Mobility Specialist

## 2022-03-22 NOTE — Progress Notes (Signed)
PROGRESS NOTE    Catherine Decker  AYT:[id number] DOB: 14-Apr-1940 DOA: 03/14/2022 PCP: Antony Contras, MD   Brief Narrative:  he Patient is an obese 82 year old female history of hypothyroidism, hypertension, as well as other comorbidities who presented to the ED after she fell down the stairs 2 days prior to admission slipped while walking hit her head on the way down but did not suffer any LOC. Patient was able to get up with help from a friend however decided to stay home. Patient's daughter noted that patient had more confusion and fatigue over the past 2 days prior to admission with decreased appetite and a 1-1/2-week history of cough and congestion and subsequently brought to the ED as patient with no clinical improvement.    Patient was seen in the ED, workup done concerning for subdural hematoma, chronic hygromas, multiple rib fractures, pneumonia, influenza A, AKI on CKD stage IIIb. Neurosurgery reviewed films and felt no further workup needed intervention at this time. Trauma surgery also assessed the patient and recommended pain management, pulmonary toileting. Patient admitted placed on empiric IV antibiotics and Tamiflu.  She is subsequently improving but renal function continues to be elevated and now slowly improving.  Will need to ensure adequate pain control.  PT OT still recommending SNF and will discharge when she is little bit more improved from a lab perspective as she is agreeable to SNF.  Legs are still swollen so we will give another dose of IV Lasix patient thinks her respiratory status is improving a little bit.  Assessment and Plan:  Fall with associated multiple rib fractures Spinous process fracture of T1 Subdural hematoma/chronic hygromas -Secondary to mechanical fall. -Patient assessed by the trauma team who reviewed imaging and recommended pain management, pulmonary toileting, incentive spirometry use secondary to rib fractures to prevent worsening pneumonia and no  further intervention at this time. -Continue incentive spirometry, flutter valve, treatment of pneumonia, pain management with Methocarbamol 1000 mg po TID, scheduled Acetaminophen 1000 mg q6h. Oxycodone IR 2.5-5 mg q4prn Moderate to Severe Pain. -Films reviewed by neurosurgery who feel no intervention needed at this time. -PT/OT recommending SNF and patient is agreeable to go to SNF   Influenza A -Initiated Tamiflu and completed 5 Days -C/w Supportive Care    Right-sided Pneumonia -Noted on CT chest. -Patient with fall with rib fractures, likely can take deep inspiratory effort. -Urine strep pneumococcus antigen positive. -SpO2: 95 % -Urine Legionella antigen still pending. -Continue IV Rocephin to complete a 5-day course of treatment. -Discontinued IV Azithromycin. -Continue scheduled DuoNebs BID and q4hprn, Flonase, Claritin, PPI, Mucinex. -Repeat CXR in the AM as CXR this AM done and showed "Improved aeration at the bilateral lung bases. Persistent airspace opacities at the lung bases, likely decreased atelectasis and/or small pleural effusions."   Hypothyroidism -Continue home regimen of Levothyroxine 112 mcg po Daily.    AKI on CKD stage IIIb Metabolic Acidosis -Initially secondary to volume depletion, dehydration in the setting of ARB. -Renal ultrasound negative for hydronephrosis. -Urinalysis with concerns for UTI. -Urine sodium of 13, urine creatinine 195. -BUN/Cr Trend: Recent Labs  Lab 03/16/22 0512 03/17/22 0423 03/18/22 0443 03/19/22 0403 03/20/22 0445 03/21/22 0405 03/22/22 0518  BUN 47* 46* 42* 42* 40* 40* 39*  CREATININE 2.56* 2.24* 2.17* 1.92* 1.92* 1.77* 1.95*   -IVF Hydration has stopped currently and she had a low bit of fluid on her so will give her a little bit of Lasix -Patient still has a slight metabolic acidosis with a  CO2 of 18, anion gap of 8, chloride level 107 -Increased Sodium Bicarbonate 650 mg po TID yesterday  -Continue to hold ARB, avoid  nephrotoxins.   -Monitor urine output.   -Likely will not resume ARB on discharge. -Avoid Further Nephrotoxic Medications, Contrast Dyes, Hypotension and Dehydration to Ensure Adequate Renal Perfusion and will need to Renally Adjust Meds -Continue to Monitor and Trend Renal Function carefully and repeat CMP in the AM    UTI -Urinalysis concerning for UTI as it showed a hazy appearance with amber color urine, moderate leukocytes, negative nitrites, many bacteria, 0-5 RBCs per high-power field, 21-50 WBCs. -Urine cultures negative.  -Patient empirically on IV Rocephin secondary to right-sided pneumonia.   Hyperlipidemia -Continue to hold home regimen for now   Volume Overload in setting of acute on Chronic Diastolic CHF -Check BNP and ECHO -BNP was 40.5 but not accurate as patient is obese -Echocardiogram done and showed "Left ventricular ejection fraction, by estimation, is 60 to 65%. The left ventricle has normal  function. The left ventricle has no  regional wall motion abnormalities. The left  ventricular internal cavity size was normal in size. There is   mild left ventricular hypertrophy. Left ventricular diastolic  parameters are consistent with Grade I diastolic dysfunction (impaired relaxation). Elevated left ventricular end-diastolic pressure. " -Given a dose of IV Lasix 40 mg x1 yesterday and will give again today  -Patient is +9.549 Liters  -Has bilateral Pleural Effusions -Had 1+ LE edema -Continue to Monitor for S/Sx of Volume Overload   Hyponatremia -Patient's Na+ Trend: Recent Labs  Lab 03/16/22 0512 03/17/22 0423 03/18/22 0443 03/19/22 0403 03/20/22 0445 03/21/22 0405 03/22/22 0518  NA 136 134* 134* 132* 133* 133* 133*  -Continue to Monitor and Trend and Repeat CMP in the AM    History of Breast Cancer Status postmastectomy/spinal lesions -Imaging with T1 a right scapular lesion concerning for Metastasis . -Patient's primary oncologist, Dr. Lorenso Courier has been  notified via epic of patient's admission. -Continue home regimen with Letrozole 2.5 mg po Daily.    -Oncology notified via epic of admission but likely will need outpatient follow up    Abnormal LFTs/Transaminitis -Likely secondary to history of cirrhosis. -Right upper quadrant ultrasound consistent with cirrhosis with portal hypertension, low-density structure in the left lobe of the liver highlighted on prior noncontrast CTs up oropharynx. -LFTs starting to worsen again as trend shows: Recent Labs  Lab 03/15/22 0427 03/16/22 0512 03/17/22 0423 03/19/22 0403 03/20/22 0445 03/21/22 0405 03/22/22 0518  AST 59* 64* 76* 87* 95* 101* 173*  ALT '25 26 28 27 30 30 '$ 38  -Outpatient follow-up.  Hypokalemia -K+ Trend: Recent Labs  Lab 03/16/22 0512 03/17/22 0423 03/18/22 0443 03/19/22 0403 03/20/22 0445 03/21/22 0405 03/22/22 0518  K 3.9 3.8 4.0 3.7 3.7 3.6 3.4*  -Replete with po Kcl 40 mEQ BID x2 -Continue to Monitor and Replete as Necessary -Repeat CMP in the AM    History of Cirrhosis -Outpatient follow-up with primary hepatologist. -Explains elevated T Bili and AST -T. bili has gone from 1.4 -> 1.6 ->  1.7 x2 -She had a little bit of fluid in her legs so we will give her a dose of Lasix again  -Continue to Monitor and Trend   Pancytopenia -Patient with a pancytopenia being followed by hematology/oncology in the outpatient setting. -CBC Trend: Recent Labs  Lab 03/16/22 0512 03/17/22 0423 03/18/22 0443 03/19/22 0403 03/20/22 0445 03/21/22 0405 03/22/22 0518  WBC 2.9* 3.5* 3.7* 3.1* 4.1  4.4 5.6  HGB 8.1* 8.7* 8.8* 8.2* 8.4* 8.1* 8.2*  HCT 25.1* 27.7* 27.0* 25.8* 26.3* 24.7* 25.2*  MCV 101.2* 103.7* 101.1* 102.4* 102.3* 101.6* 102.9*  PLT 50* 59* 77* 78* 92* 90* 109*  -Hgb/Hct is relatively stable and Plt Count and WBC count improving  -Patient with no S/Sx of bleeding. -Hematology/Oncology informed of admission via epic. -Will continue to Monitor and Trend     Lactic Acidosis -Likely secondary to acute infection of pneumonia and influenza A. -Lactic acid levels trending down and last check was 2.6 -The antibiotics and IV fluid have now stopped -Repeat labs in the AM.   Diabetes Mellitus Type 2 -Hemoglobin A1c 5.0 (11/05/2020). -CBG's ranging from 80-138 -Hemoglobin A1c 4.9.   -Continue to hold oral hypoglycemic agents.  -Discontinued SSI, CBGs.  -Outpatient follow-up.    Hypertension -BP stable and had been somewhat soft. -Continue to hold home regimen antihypertensive medications.   -IV fluids have now stopped  -Continue to Monitor BP per protocol -Last BP reading was 135/55   Pressure injury, stage I medial buttocks, POA Pressure Injury 03/14/22 Buttocks Medial;Mid Stage 1 -  Intact skin with non-blanchable redness of a localized area usually over a bony prominence. (Active)  03/14/22 2200  Location: Buttocks  Location Orientation: Medial;Mid  Staging: Stage 1 -  Intact skin with non-blanchable redness of a localized area usually over a bony prominence.  Wound Description (Comments):   Present on Admission: Yes    Hypoalbuminemia -Patient's Albumin Level went from 1.9 -> 2.3 -> 2.0 -> 2.0 -> 2.1 -> 1.9 -> 2.2  -Continue to Monitor and Trend and Repeat CMP in the AM    Hyperbilirubinemia -Patient's T Bili went from 2.0 -> 1.5 -> 1.4 x2 -> 1.6 -> 1.7 x2 -Continue to Monitor and Trend -Repeat CMP in the AM    Obesity -Complicates overall prognosis and care -Estimated body mass index is 30.34 kg/m as calculated from the following:   Height as of this encounter: '5\' 3"'$  (1.6 m).   Weight as of this encounter: 77.7 kg.  -Weight Loss and Dietary Counseling given  DVT prophylaxis: SCDs Start: 03/14/22 2212 Place and maintain sequential compression device Start: 03/14/22 1706    Code Status: DNR Family Communication: No family currently at bedside  Disposition Plan:  Level of care: Progressive Status is: Inpatient Remains  inpatient appropriate because: He is slowly improving and needs SNF and will need further improvement in her electrolyte abnormalities prior to discharging   Consultants:  Oncology notified of admission as a courtesy via [employer] Team Dr. Bobbye Morton Neurosurgery  Procedures:  CT head CT C-spine 03/14/2022 Chest x-ray 03/14/2022 Renal ultrasound 03/14/2022 Right upper quadrant ultrasound 03/15/2022 CT chest abdomen and pelvis 03/14/2022  Antimicrobials:  Anti-infectives (From admission, onward)    Start     Dose/Rate Route Frequency Ordered Stop   03/15/22 1600  azithromycin (ZITHROMAX) 500 mg in sodium chloride 0.9 % 250 mL IVPB  Status:  Discontinued        500 mg 250 mL/hr over 60 Minutes Intravenous Every 24 hours 03/14/22 2211 03/17/22 1759   03/15/22 1000  cefTRIAXone (ROCEPHIN) 2 g in sodium chloride 0.9 % 100 mL IVPB        2 g 200 mL/hr over 30 Minutes Intravenous Every 24 hours 03/14/22 2211 03/20/22 1520   03/15/22 1000  oseltamivir (TAMIFLU) capsule 30 mg        30 mg Oral Daily 03/15/22 0910 03/19/22 1200   03/14/22  1530  cefTRIAXone (ROCEPHIN) 1 g in sodium chloride 0.9 % 100 mL IVPB        1 g 200 mL/hr over 30 Minutes Intravenous  Once 03/14/22 1515 03/14/22 1606   03/14/22 1530  azithromycin (ZITHROMAX) 500 mg in sodium chloride 0.9 % 250 mL IVPB        500 mg 250 mL/hr over 60 Minutes Intravenous  Once 03/14/22 1515 03/14/22 1717       Subjective: Seen and examined at bedside and patient was again ambulating with the mobility tech out in the hallway.  She think she is doing better and thinks her breathing is doing better.  Pain is improving a little bit.  Continues to have some leg swelling.  Continues to be weak.  No nausea or vomiting.  No other concerns or close this time.  Objective: Vitals:   03/21/22 0539 03/21/22 2116 03/22/22 0439 03/22/22 1243  BP: (!) 148/58 138/60 (!) 135/58 (!) 135/55  Pulse: 85 87 84 85  Resp: '18 19 18 18  '$ Temp: 98 F  (36.7 C) 98.7 F (37.1 C) 97.8 F (36.6 C) 97.8 F (36.6 C)  TempSrc: Oral Oral Oral Oral  SpO2: 95% 97% 91% 96%  Weight:   77.7 kg   Height:        Intake/Output Summary (Last 24 hours) at 03/22/2022 1939 Last data filed at 03/21/2022 2030 Gross per 24 hour  Intake 120 ml  Output --  Net 120 ml   Filed Weights   03/20/22 0556 03/21/22 0500 03/22/22 0439  Weight: 81.2 kg 76.9 kg 77.7 kg   Examination: Physical Exam:  Constitutional: WN/WD obese Caucasian female currently no acute distress Respiratory: Diminished to auscultation bilaterally with coarse breath sounds and some crackles, no wheezing, rales, rhonchi. Normal respiratory effort and patient is not tachypenic. No accessory muscle use.  Unlabored breathing Cardiovascular: RRR, no murmurs / rubs / gallops. S1 and S2 auscultated.  1+ lower extremity edema Abdomen: Soft, non-tender, distended secondary body habitus. Bowel sounds positive.  GU: Deferred. Musculoskeletal: No clubbing / cyanosis of digits/nails. No joint deformity upper and lower extremities.  Skin: No rashes, lesions, ulcers limited skin evaluation. No induration; Warm and dry.  Neurologic: CN 2-12 grossly intact with no focal deficits. Romberg sign and cerebellar reflexes not assessed.  Psychiatric: Normal judgment and insight. Alert and oriented x 3. Normal mood and appropriate affect.   Data Reviewed: I have personally reviewed following labs and imaging studies  CBC: Recent Labs  Lab 03/17/22 0423 03/18/22 0443 03/19/22 0403 03/20/22 0445 03/21/22 0405 03/22/22 0518  WBC 3.5* 3.7* 3.1* 4.1 4.4 5.6  NEUTROABS 2.6 2.3  --  2.7 3.1 4.3  HGB 8.7* 8.8* 8.2* 8.4* 8.1* 8.2*  HCT 27.7* 27.0* 25.8* 26.3* 24.7* 25.2*  MCV 103.7* 101.1* 102.4* 102.3* 101.6* 102.9*  PLT 59* 77* 78* 92* 90* 741*   Basic Metabolic Panel: Recent Labs  Lab 03/17/22 0423 03/18/22 0443 03/19/22 0403 03/20/22 0445 03/21/22 0405 03/22/22 0518  NA 134* 134* 132* 133* 133*  133*  K 3.8 4.0 3.7 3.7 3.6 3.4*  CL 109 108 108 108 108 107  CO2 17* 18* 17* 18* 17* 18*  GLUCOSE 90 83 84 100* 95 90  BUN 46* 42* 42* 40* 40* 39*  CREATININE 2.24* 2.17* 1.92* 1.92* 1.77* 1.95*  CALCIUM 8.1* 8.1* 8.0* 8.3* 8.2* 8.4*  MG 1.6* 2.5* 2.2 2.1 2.0 1.9  PHOS 3.3  --   --  3.3 3.2 3.6  GFR: Estimated Creatinine Clearance: 22.7 mL/min (A) (by C-G formula based on SCr of 1.95 mg/dL (H)). Liver Function Tests: Recent Labs  Lab 03/17/22 0423 03/19/22 0403 03/20/22 0445 03/21/22 0405 03/22/22 0518  AST 76* 87* 95* 101* 173*  ALT '28 27 30 30 '$ 38  ALKPHOS 106 129* 139* 145* 171*  BILITOT 1.4* 1.4* 1.6* 1.7* 1.7*  PROT 5.3* 5.0* 5.0* 4.8* 5.2*  ALBUMIN 2.0* 2.0* 2.1* 1.9* 2.2*   No results for input(s): "LIPASE", "AMYLASE" in the last 168 hours. No results for input(s): "AMMONIA" in the last 168 hours. Coagulation Profile: No results for input(s): "INR", "PROTIME" in the last 168 hours. Cardiac Enzymes: No results for input(s): "CKTOTAL", "CKMB", "CKMBINDEX", "TROPONINI" in the last 168 hours. BNP (last 3 results) No results for input(s): "PROBNP" in the last 8760 hours. HbA1C: No results for input(s): "HGBA1C" in the last 72 hours. CBG: Recent Labs  Lab 03/16/22 2128 03/17/22 0749 03/17/22 1155 03/17/22 1638 03/17/22 2120  GLUCAP 138* 80 88 98 86   Lipid Profile: No results for input(s): "CHOL", "HDL", "LDLCALC", "TRIG", "CHOLHDL", "LDLDIRECT" in the last 72 hours. Thyroid Function Tests: No results for input(s): "TSH", "T4TOTAL", "FREET4", "T3FREE", "THYROIDAB" in the last 72 hours. Anemia Panel: No results for input(s): "VITAMINB12", "FOLATE", "FERRITIN", "TIBC", "IRON", "RETICCTPCT" in the last 72 hours. Sepsis Labs: Recent Labs  Lab 03/16/22 0512 03/17/22 0423  LATICACIDVEN 3.1* 2.6*    Recent Results (from the past 240 hour(s))  Resp panel by RT-PCR (RSV, Flu A&B, Covid) Anterior Nasal Swab     Status: Abnormal   Collection Time: 03/14/22 11:12  AM   Specimen: Anterior Nasal Swab  Result Value Ref Range Status   SARS Coronavirus 2 by RT PCR NEGATIVE NEGATIVE Final    Comment: (NOTE) SARS-CoV-2 target nucleic acids are NOT DETECTED.  The SARS-CoV-2 RNA is generally detectable in upper respiratory specimens during the acute phase of infection. The lowest concentration of SARS-CoV-2 viral copies this assay can detect is 138 copies/mL. A negative result does not preclude SARS-Cov-2 infection and should not be used as the sole basis for treatment or other patient management decisions. A negative result may occur with  improper specimen collection/handling, submission of specimen other than nasopharyngeal swab, presence of viral mutation(s) within the areas targeted by this assay, and inadequate number of viral copies(<138 copies/mL). A negative result must be combined with clinical observations, patient history, and epidemiological information. The expected result is Negative.  Fact Sheet for Patients:  EntrepreneurPulse.com.au  Fact Sheet for Healthcare Providers:  IncredibleEmployment.be  This test is no t yet approved or cleared by the [place] FDA and  has been authorized for detection and/or diagnosis of SARS-CoV-2 by FDA under an Emergency Use Authorization (EUA). This EUA will remain  in effect (meaning this test can be used) for the duration of the COVID-19 declaration under Section 564(b)(1) of the Act, 21 U.S.C.section 360bbb-3(b)(1), unless the authorization is terminated  or revoked sooner.       Influenza A by PCR POSITIVE (A) NEGATIVE Final   Influenza B by PCR NEGATIVE NEGATIVE Final    Comment: (NOTE) The Xpert Xpress SARS-CoV-2/FLU/RSV plus assay is intended as an aid in the diagnosis of influenza from Nasopharyngeal swab specimens and should not be used as a sole basis for treatment. Nasal washings and aspirates are unacceptable for Xpert Xpress  SARS-CoV-2/FLU/RSV testing.  Fact Sheet for Patients: EntrepreneurPulse.com.au  Fact Sheet for Healthcare Providers: IncredibleEmployment.be  This test is not yet approved or  cleared by the [place] and has been authorized for detection and/or diagnosis of SARS-CoV-2 by FDA under an Emergency Use Authorization (EUA). This EUA will remain in effect (meaning this test can be used) for the duration of the COVID-19 declaration under Section 564(b)(1) of the Act, 21 U.S.C. section 360bbb-3(b)(1), unless the authorization is terminated or revoked.     Resp Syncytial Virus by PCR NEGATIVE NEGATIVE Final    Comment: (NOTE) Fact Sheet for Patients: EntrepreneurPulse.com.au  Fact Sheet for Healthcare Providers: IncredibleEmployment.be  This test is not yet approved or cleared by the [place] FDA and has been authorized for detection and/or diagnosis of SARS-CoV-2 by FDA under an Emergency Use Authorization (EUA). This EUA will remain in effect (meaning this test can be used) for the duration of the COVID-19 declaration under Section 564(b)(1) of the Act, 21 U.S.C. section 360bbb-3(b)(1), unless the authorization is terminated or revoked.  Performed at [facility], [place] [address] [place], [place] [address]   Culture, blood (Routine X 2) w Reflex to ID Panel     Status: None   Collection Time: 03/14/22  5:20 PM   Specimen: BLOOD  Result Value Ref Range Status   Specimen Description   Final    BLOOD LEFT ANTECUBITAL Performed at [facility] [address] [place], [place] [address]    Special Requests   Final    BOTTLES DRAWN AEROBIC AND ANAEROBIC Blood Culture adequate volume Performed at [facility] [address], [place], [place] [address]    Culture   Final    NO GROWTH 5 DAYS Performed at [facility], [place] [address], [place], [place] [address]    Report Status 03/19/2022 FINAL  Final  Culture, blood (Routine X 2) w Reflex to ID Panel     Status: None   Collection Time: 03/14/22 10:56 PM   Specimen: BLOOD  Result Value Ref Range Status   Specimen Description   Final    BLOOD BLOOD LEFT ARM Performed at [facility] [address] [place], [place] [address]    Special Requests   Final    BOTTLES DRAWN AEROBIC ONLY Blood Culture adequate volume Performed at [facility] [address] [place], [place] [address]    Culture   Final    NO GROWTH 5 DAYS Performed at [facility], [place] [address], [place], [place] [address]    Report Status 03/20/2022 FINAL  Final  Urine Culture     Status: None   Collection Time: 03/15/22  7:53 AM   Specimen: Urine, Clean Catch  Result Value Ref Range Status   Specimen Description   Final    URINE, CLEAN CATCH Performed at [facility], [place] [address], [place], [place] [address]    Special Requests   Final    NONE Performed at [facility], [place] [address] [place], [place] [address]    Culture   Final    NO GROWTH Performed at [facility], [place] [address] [place], [place] [address]    Report Status 03/16/2022 FINAL  Final     Radiology Studies: DG CHEST PORT 1 VIEW  Result Date: 03/22/2022 CLINICAL DATA:  Shortness of breath, cough. EXAM: PORTABLE CHEST 1 VIEW COMPARISON:  Chest x-rays dated 03/21/2022 and 03/20/2022 chest CT dated 03/14/2022. FINDINGS: Persistent airspace opacities at the bilateral lung bases, atelectasis and/or small pleural effusions, but  improved aeration at the lung bases compared to the recent chest x-rays. No new lung findings. No pneumothorax is seen. Heart size and mediastinal contours appear stable. IMPRESSION: Improved aeration at the bilateral lung bases. Persistent airspace opacities at the lung bases, likely decreased atelectasis and/or  small pleural effusions. Electronically Signed   By: Franki Cabot M.D.   On: 03/22/2022 09:40   ECHOCARDIOGRAM COMPLETE  Result Date: 03/22/2022    ECHOCARDIOGRAM REPORT   Patient Name:   BRITLEE SKOLNIK Date of Exam: 03/22/2022 Medical Rec #:  [id number]       Height:       63.0 in Accession #:    [id number]      Weight:       171.3 lb Date of Birth:  03/20/1940      BSA:          1.810 m Patient Age:    89 years        BP:           135/58 mmHg Patient Gender: F               HR:           66 bpm. Exam Location:  Inpatient Procedure: 2D Echo, Cardiac Doppler and Color Doppler Indications:    Bacteremia  History:        Patient has no prior history of Echocardiogram examinations.                 Risk Factors:Diabetes, Hypertension and Dyslipidemia. FLU                 positive. Breast cancer 2021. SDH.  Sonographer:    Roseanna Rainbow RDCS Referring Phys: [id number] [facility] LATIF [facility]  Sonographer Comments: Suboptimal parasternal window and suboptimal subcostal window. Could not turn due to multiple rib fractures. IMPRESSIONS  1. Left ventricular ejection fraction, by estimation, is 60 to 65%. The left ventricle has normal function. The left ventricle has no regional wall motion abnormalities. There is mild left ventricular hypertrophy. Left ventricular diastolic parameters are consistent with Grade I diastolic dysfunction (impaired relaxation). Elevated left ventricular end-diastolic pressure.  2. Right ventricular systolic function is normal. The right ventricular size is normal.  3. The mitral valve is abnormal. No evidence of mitral valve regurgitation. No evidence of mitral stenosis.  4. The aortic valve is tricuspid. Aortic valve regurgitation is not visualized. No aortic stenosis is present.  5. The inferior vena cava is normal in size with greater than 50% respiratory variability, suggesting right atrial pressure of 3 mmHg. FINDINGS  Left Ventricle: Left ventricular ejection fraction, by estimation, is 60 to  65%. The left ventricle has normal function. The left ventricle has no regional wall motion abnormalities. The left ventricular internal cavity size was normal in size. There is  mild left ventricular hypertrophy. Left ventricular diastolic parameters are consistent with Grade I diastolic dysfunction (impaired relaxation). Elevated left ventricular end-diastolic pressure. Right Ventricle: The right ventricular size is normal. No increase in right ventricular wall thickness. Right ventricular systolic function is normal. Left Atrium: Left atrial size was normal in size. Right Atrium: Right atrial size was normal in size. Pericardium: There is no evidence of pericardial effusion. Mitral Valve: The mitral valve is abnormal. There is mild thickening of the mitral valve leaflet(s). Mild mitral annular calcification. No evidence of mitral valve regurgitation. No evidence of mitral valve stenosis. Tricuspid Valve: The tricuspid valve is normal in structure. Tricuspid valve regurgitation is  mild . No evidence of tricuspid stenosis. Aortic Valve: The aortic valve is tricuspid. Aortic valve regurgitation is not visualized. No aortic stenosis is present. Pulmonic Valve: The pulmonic valve was normal in structure. Pulmonic valve regurgitation is mild. No evidence of pulmonic stenosis. Aorta: The aortic root is normal in size and structure. Venous: The inferior vena cava is normal in size with greater than 50% respiratory variability, suggesting right atrial pressure of 3 mmHg. IAS/Shunts: No atrial level shunt detected by color flow Doppler.  LEFT VENTRICLE PLAX 2D LVIDd:         4.30 cm   Diastology LVIDs:         2.25 cm   LV e' medial:    6.31 cm/s LV PW:         1.10 cm   LV E/e' medial:  14.5 LV IVS:        1.30 cm   LV e' lateral:   6.31 cm/s LVOT diam:     2.30 cm   LV E/e' lateral: 14.5 LV SV:         125 LV SV Index:   69 LVOT Area:     4.15 cm  RIGHT VENTRICLE             IVC RV S prime:     12.20 cm/s  IVC diam:  1.50 cm TAPSE (M-mode): 2.2 cm LEFT ATRIUM             Index        RIGHT ATRIUM           Index LA diam:        3.40 cm 1.88 cm/m   RA Area:     11.10 cm LA Vol (A2C):   29.9 ml 16.52 ml/m  RA Volume:   19.60 ml  10.83 ml/m LA Vol (A4C):   24.2 ml 13.37 ml/m LA Biplane Vol: 26.2 ml 14.47 ml/m  AORTIC VALVE LVOT Vmax:   145.00 cm/s LVOT Vmean:  104.000 cm/s LVOT VTI:    0.302 m  AORTA Ao Root diam: 3.70 cm Ao Asc diam:  3.30 cm MITRAL VALVE MV Area (PHT): 3.42 cm    SHUNTS MV Decel Time: 222 msec    Systemic VTI:  0.30 m MV E velocity: 91.80 cm/s  Systemic Diam: 2.30 cm MV A velocity: 95.50 cm/s MV E/A ratio:  0.96 Jenkins Rouge MD Electronically signed by Jenkins Rouge MD Signature Date/Time: 03/22/2022/9:30:38 AM    Final    DG CHEST PORT 1 VIEW  Result Date: 03/21/2022 CLINICAL DATA:  Shortness of breath EXAM: PORTABLE CHEST 1 VIEW COMPARISON:  Chest radiograph dated 03/20/2022 FINDINGS: Low lung volumes with similar bibasilar and right lower linear opacities. Similar small bilateral pleural effusions. No pneumothorax. Similar cardiomediastinal silhouette. The visualized skeletal structures are unremarkable. IMPRESSION: 1. Similar small bilateral pleural effusions. 2. Low lung volumes with similar bibasilar and right lower linear opacities, likely atelectasis. Electronically Signed   By: Darrin Nipper M.D.   On: 03/21/2022 08:36    Scheduled Meds:  acetaminophen  1,000 mg Oral Q6H   calcium carbonate  1,250 mg Oral Q breakfast   cholecalciferol  1,000 Units Oral Daily   cyanocobalamin  1,000 mcg Oral Daily   fluticasone  2 spray Each Nare Daily   folic acid  1 mg Oral Daily   guaiFENesin  1,200 mg Oral BID   hydrocortisone   Rectal TID   letrozole  2.5 mg Oral Daily  levothyroxine  112 mcg Oral Q0600   loratadine  10 mg Oral Daily   methocarbamol  1,000 mg Oral TID   pantoprazole  40 mg Oral Daily   potassium chloride  40 mEq Oral BID   senna-docusate  1 tablet Oral BID   sodium bicarbonate   650 mg Oral TID   Continuous Infusions:   LOS: 8 days   Raiford Noble, DO [facility] Available via Epic secure chat 7am-7pm After these hours, please refer to coverage provider listed on [email or web address] 03/22/2022, 7:39 PM

## 2022-03-22 NOTE — Progress Notes (Signed)
  Echocardiogram 2D Echocardiogram has been performed.  Catherine Decker 03/22/2022, 9:18 AM

## 2022-03-23 DIAGNOSIS — S22008A Other fracture of unspecified thoracic vertebra, initial encounter for closed fracture: Secondary | ICD-10-CM | POA: Diagnosis not present

## 2022-03-23 DIAGNOSIS — E039 Hypothyroidism, unspecified: Secondary | ICD-10-CM | POA: Diagnosis not present

## 2022-03-23 DIAGNOSIS — N179 Acute kidney failure, unspecified: Secondary | ICD-10-CM | POA: Diagnosis not present

## 2022-03-23 DIAGNOSIS — W19XXXD Unspecified fall, subsequent encounter: Secondary | ICD-10-CM | POA: Diagnosis not present

## 2022-03-23 LAB — CBC WITH DIFFERENTIAL/PLATELET
Abs Immature Granulocytes: 0.19 10*3/uL — ABNORMAL HIGH (ref 0.00–0.07)
Basophils Absolute: 0 10*3/uL (ref 0.0–0.1)
Basophils Relative: 1 %
Eosinophils Absolute: 0.2 10*3/uL (ref 0.0–0.5)
Eosinophils Relative: 2 %
HCT: 27.7 % — ABNORMAL LOW (ref 36.0–46.0)
Hemoglobin: 8.7 g/dL — ABNORMAL LOW (ref 12.0–15.0)
Immature Granulocytes: 3 %
Lymphocytes Relative: 9 %
Lymphs Abs: 0.6 10*3/uL — ABNORMAL LOW (ref 0.7–4.0)
MCH: 33.7 pg (ref 26.0–34.0)
MCHC: 31.4 g/dL (ref 30.0–36.0)
MCV: 107.4 fL — ABNORMAL HIGH (ref 80.0–100.0)
Monocytes Absolute: 0.3 10*3/uL (ref 0.1–1.0)
Monocytes Relative: 4 %
Neutro Abs: 5.1 10*3/uL (ref 1.7–7.7)
Neutrophils Relative %: 81 %
Platelets: 106 10*3/uL — ABNORMAL LOW (ref 150–400)
RBC: 2.58 MIL/uL — ABNORMAL LOW (ref 3.87–5.11)
RDW: 18.2 % — ABNORMAL HIGH (ref 11.5–15.5)
WBC: 6.2 10*3/uL (ref 4.0–10.5)
nRBC: 0 % (ref 0.0–0.2)

## 2022-03-23 LAB — COMPREHENSIVE METABOLIC PANEL
ALT: 41 U/L (ref 0–44)
AST: 162 U/L — ABNORMAL HIGH (ref 15–41)
Albumin: 2.2 g/dL — ABNORMAL LOW (ref 3.5–5.0)
Alkaline Phosphatase: 177 U/L — ABNORMAL HIGH (ref 38–126)
Anion gap: 7 (ref 5–15)
BUN: 46 mg/dL — ABNORMAL HIGH (ref 8–23)
CO2: 19 mmol/L — ABNORMAL LOW (ref 22–32)
Calcium: 8.2 mg/dL — ABNORMAL LOW (ref 8.9–10.3)
Chloride: 105 mmol/L (ref 98–111)
Creatinine, Ser: 2.13 mg/dL — ABNORMAL HIGH (ref 0.44–1.00)
GFR, Estimated: 23 mL/min — ABNORMAL LOW (ref >60)
Glucose, Bld: 147 mg/dL — ABNORMAL HIGH (ref 70–99)
Potassium: 4.5 mmol/L (ref 3.5–5.1)
Sodium: 131 mmol/L — ABNORMAL LOW (ref 135–145)
Total Bilirubin: 1.8 mg/dL — ABNORMAL HIGH (ref 0.3–1.2)
Total Protein: 5.2 g/dL — ABNORMAL LOW (ref 6.5–8.1)

## 2022-03-23 LAB — PHOSPHORUS: Phosphorus: 3.4 mg/dL (ref 2.5–4.6)

## 2022-03-23 LAB — MAGNESIUM: Magnesium: 2.1 mg/dL (ref 1.7–2.4)

## 2022-03-23 MED ORDER — FUROSEMIDE 10 MG/ML IJ SOLN
40.0000 mg | Freq: Once | INTRAMUSCULAR | Status: AC
  Administered 2022-03-23: 40 mg via INTRAVENOUS
  Filled 2022-03-23: qty 4

## 2022-03-23 MED ORDER — SODIUM BICARBONATE 650 MG PO TABS
650.0000 mg | ORAL_TABLET | Freq: Three times a day (TID) | ORAL | Status: AC
  Administered 2022-03-23 – 2022-03-25 (×6): 650 mg via ORAL
  Filled 2022-03-23 (×6): qty 1

## 2022-03-23 NOTE — Progress Notes (Signed)
Mobility Specialist - Progress Note   03/23/22 1206  Mobility  Activity Transferred to/from [facility];Ambulated with assistance in hallway  Level of Assistance Contact guard assist, steadying assist  Assistive Device Front wheel walker  Distance Ambulated (ft) 80 ft  Activity Response Tolerated well  Mobility Referral Yes  $Mobility charge 1 Mobility   Pt received in bed and agreeable to mobility. Pt requested assistance to [facility] for BM. No complaints during ambulation & transfer. Pt to recliner after session with all needs met & family in room.    [facility]

## 2022-03-23 NOTE — Progress Notes (Signed)
PROGRESS NOTE    Catherine Decker  MPN:[id number] DOB: 20-May-1940 DOA: 03/14/2022 PCP: Antony Contras, MD   Brief Narrative:  The Patient is an obese 82 year old female history of hypothyroidism, hypertension, as well as other comorbidities who presented to the ED after she fell down the stairs 2 days prior to admission slipped while walking hit her head on the way down but did not suffer any LOC. Patient was able to get up with help from a friend however decided to stay home. Patient's daughter noted that patient had more confusion and fatigue over the past 2 days prior to admission with decreased appetite and a 1-1/2-week history of cough and congestion and subsequently brought to the ED as patient with no clinical improvement.    Patient was seen in the ED, workup done concerning for subdural hematoma, chronic hygromas, multiple rib fractures, pneumonia, influenza A, AKI on CKD stage IIIb. Neurosurgery reviewed films and felt no further workup needed intervention at this time. Trauma surgery also assessed the patient and recommended pain management, pulmonary toileting. Patient admitted placed on empiric IV antibiotics and Tamiflu.  She is subsequently improving but renal function continues to be elevated and now slowly improving.  Will need to ensure adequate pain control.  PT OT still recommending SNF and will discharge when she is little bit more improved from a lab perspective as she is agreeable to SNF.   Legs remain swollen she is going to get another dose of IV Lasix but will need to watch her renal function carefully.  Will call and discuss with Dr. Lorenso Courier in the morning given that the CT scans that she had last week showed multiple metastatic lesions  Assessment and Plan:  Fall with associated multiple rib fractures Spinous process fracture of T1 Subdural hematoma/chronic hygromas -Secondary to mechanical fall. -Patient assessed by the trauma team who reviewed imaging and recommended  pain management, pulmonary toileting, incentive spirometry use secondary to rib fractures to prevent worsening pneumonia and no further intervention at this time. -Continue incentive spirometry, flutter valve, treatment of pneumonia, pain management with Methocarbamol 1000 mg po TID, scheduled Acetaminophen 1000 mg q6h. Oxycodone IR 2.5-5 mg q4prn Moderate to Severe Pain. -Films reviewed by neurosurgery who feel no intervention needed at this time. -PT/OT recommending SNF and patient is agreeable to go to SNF when she is diuresed and improved   Influenza A -Initiated Tamiflu and completed 5 Days -C/w Supportive Care    Right-sided Pneumonia -Noted on CT chest. -Patient with fall with rib fractures, likely can take deep inspiratory effort. -Urine strep pneumococcus antigen positive. -SpO2: 98 % -Urine Legionella antigen still pending. -Continue IV Rocephin to complete a 5-day course of treatment. -Discontinued IV Azithromycin. -Continue scheduled DuoNebs BID and q4hprn, Flonase, Claritin, PPI, Mucinex. -Repeat CXR in the AM as last CXR done and showed "Improved aeration at the bilateral lung bases. Persistent airspace opacities at the lung bases, likely decreased atelectasis and/or small pleural effusions."   Hypothyroidism -Continue home regimen of Levothyroxine 112 mcg po Daily.    AKI on CKD stage IIIb Metabolic Acidosis -Initially secondary to volume depletion, dehydration in the setting of ARB. -Renal ultrasound negative for hydronephrosis. -Urinalysis with concerns for UTI. -Urine sodium of 13, urine creatinine 195. -BUN/Cr Trend: Recent Labs  Lab 03/17/22 0423 03/18/22 0443 03/19/22 0403 03/20/22 0445 03/21/22 0405 03/22/22 0518 03/23/22 1028  BUN 46* 42* 42* 40* 40* 39* 46*  CREATININE 2.24* 2.17* 1.92* 1.92* 1.77* 1.95* 2.13*  -IVF Hydration has stopped  currently and she had a low bit of fluid on her so will give her a little bit of Lasix -Patient still has a slight  metabolic acidosis with a CO2 of 19, anion gap of 7, chloride level 105 -Increased Sodium Bicarbonate 650 mg po TID yesterday  -Continue to hold ARB, avoid nephrotoxins.   -Monitor urine output.   -Likely will not resume ARB on discharge. -Avoid Further Nephrotoxic Medications, Contrast Dyes, Hypotension and Dehydration to Ensure Adequate Renal Perfusion and will need to Renally Adjust Meds -Continue to Monitor and Trend Renal Function carefully and repeat CMP in the AM    UTI -Urinalysis concerning for UTI as it showed a hazy appearance with amber color urine, moderate leukocytes, negative nitrites, many bacteria, 0-5 RBCs per high-power field, 21-50 WBCs. -Urine cultures negative.  -Patient empirically on IV Rocephin secondary to right-sided pneumonia.   Hyperlipidemia -Continue to hold home regimen for now   Volume Overload in setting of acute on Chronic Diastolic CHF -Check BNP and ECHO -BNP was 40.5 but not accurate as patient is obese -Echocardiogram done and showed "Left ventricular ejection fraction, by estimation, is 60 to 65%. The left ventricle has normal  function. The left ventricle has no  regional wall motion abnormalities. The left  ventricular internal cavity size was normal in size. There is   mild left ventricular hypertrophy. Left ventricular diastolic  parameters are consistent with Grade I diastolic dysfunction (impaired relaxation). Elevated left ventricular end-diastolic pressure. " -Given a dose of IV Lasix 40 mg x1 yesterday and will give again today given renal  -Patient is +9.909 Liters  -Has bilateral Pleural Effusions -Continues to have  1+ LE edema -Continue to Monitor for S/Sx of Volume Overload -Repeat CXR in the AM    Hyponatremia -Patient's Na+ Trend: Recent Labs  Lab 03/17/22 0423 03/18/22 0443 03/19/22 0403 03/20/22 0445 03/21/22 0405 03/22/22 0518 03/23/22 1028  NA 134* 134* 132* 133* 133* 133* 131*  -Continue to Monitor and Trend and  Repeat CMP in the AM    History of Breast Cancer Status postmastectomy/spinal lesions and now Left Iliac Wing Lytic Lesion -Imaging with T1 a right scapular lesion concerning for Metastasis. -CT Chest/Abd/Pelvs done and showed "Osseous metastasis, most apparent as a lytic lesion in the left iliac wing. Suspicion of right acromion process metastasis as well. -Patient's primary oncologist, Dr. Lorenso Courier has been notified via epic of patient's admission but will call again in the AM  -Continue home regimen with Letrozole 2.5 mg po Daily.      Abnormal LFTs/Transaminitis -Likely secondary to history of cirrhosis. -Right upper quadrant ultrasound consistent with cirrhosis with portal hypertension, low-density structure in the left lobe of the liver highlighted on prior noncontrast CTs up oropharynx. -LFTs starting to worsen again as trend shows: Recent Labs  Lab 03/16/22 0512 03/17/22 0423 03/19/22 0403 03/20/22 0445 03/21/22 0405 03/22/22 0518 03/23/22 1028  AST 64* 76* 87* 95* 101* 173* 162*  ALT '26 28 27 30 30 '$ 38 41  -Outpatient follow-up.   Hypokalemia -K+ Trend: Recent Labs  Lab 03/17/22 0423 03/18/22 0443 03/19/22 0403 03/20/22 0445 03/21/22 0405 03/22/22 0518 03/23/22 1028  K 3.8 4.0 3.7 3.7 3.6 3.4* 4.5  -Continue to Monitor and Replete as Necessary -Repeat CMP in the AM    Liver Cirrhosis -Outpatient follow-up with primary hepatologist. -Explains elevated T Bili and AST -T. bili has gone from 1.4 -> 1.6 ->  1.7 x2 -She had a little bit of fluid in her legs  so we will give her a dose of Lasix again  -Continue to Monitor and Trend   Pancytopenia -Patient with a pancytopenia being followed by hematology/oncology in the outpatient setting. -CBC Trend: Last Labs           Recent Labs  Lab 03/16/22 0512 03/17/22 0423 03/18/22 0443 03/19/22 0403 03/20/22 0445 03/21/22 0405 03/22/22 0518  WBC 2.9* 3.5* 3.7* 3.1* 4.1 4.4 5.6  HGB 8.1* 8.7* 8.8* 8.2* 8.4* 8.1* 8.2*   HCT 25.1* 27.7* 27.0* 25.8* 26.3* 24.7* 25.2*  MCV 101.2* 103.7* 101.1* 102.4* 102.3* 101.6* 102.9*  PLT 50* 59* 77* 78* 92* 90* 109*    -Hgb/Hct is relatively stable and Plt Count and WBC count improving  -Patient with no S/Sx of bleeding. -Hematology/Oncology informed of admission via epic. -Will continue to Monitor and Trend    Lactic Acidosis -Likely secondary to acute infection of pneumonia and influenza A. -Lactic acid levels trending down and last check was 2.6 -The antibiotics and IV fluid have now stopped -Repeat labs in the AM.   Diabetes Mellitus Type 2 -Hemoglobin A1c 5.0 (11/05/2020). -CBG's ranging from 80-138 -Hemoglobin A1c 4.9.   -Continue to hold oral hypoglycemic agents.  -Discontinued SSI, CBGs.  -Outpatient follow-up.   Hypoalbuminemia -Patient's Albumin Level Trend: Recent Labs  Lab 03/16/22 0512 03/17/22 0423 03/19/22 0403 03/20/22 0445 03/21/22 0405 03/22/22 0518 03/23/22 1028  ALBUMIN 2.3* 2.0* 2.0* 2.1* 1.9* 2.2* 2.2*  -Continue to Monitor and Trend   Hypertension -BP stable and had been somewhat soft. -Continue to hold home regimen antihypertensive medications.   -IV fluids have now stopped  -Continue to Monitor BP per protocol -Last BP reading was 135/55   Pressure injury, stage I medial buttocks, POA Pressure Injury 03/14/22 Buttocks Medial;Mid Stage 1 -  Intact skin with non-blanchable redness of a localized area usually over a bony prominence. (Active)  03/14/22 2200  Location: Buttocks  Location Orientation: Medial;Mid  Staging: Stage 1 -  Intact skin with non-blanchable redness of a localized area usually over a bony prominence.  Wound Description (Comments):   Present on Admission: Yes     Hyperbilirubinemia -Patient's T Bili went from 2.0 -> 1.5 -> 1.4 x2 -> 1.6 -> 1.7 x2 -Continue to Monitor and Trend -Repeat CMP in the AM    Obesity -Complicates overall prognosis and care -Estimated body mass index is 30.46 kg/m as  calculated from the following:   Height as of this encounter: '5\' 3"'$  (1.6 m).   Weight as of this encounter: 78 kg.  -Weight Loss and Dietary Counseling given  DVT prophylaxis: SCDs Start: 03/14/22 2212 Place and maintain sequential compression device Start: 03/14/22 1706    Code Status: DNR Family Communication: Discussed with Daughter and Son-In-Law at bedside   Disposition Plan:  Level of care: Progressive Status is: Inpatient Remains inpatient appropriate because: Remains volume overloaded and will need further diuresis.  She is improving and chest pain and shortness of breath is improving.  Will need to discuss with medical oncology given her new lytic lesions   Consultants:  Oncology notified of admission as a courtesy via Epic General Surgery/Trauma Team Dr. Bobbye Morton Neurosurgery  Procedures:  CT head CT C-spine 03/14/2022 Chest x-ray 03/14/2022 Renal ultrasound 03/14/2022 Right upper quadrant ultrasound 03/15/2022 CT chest abdomen and pelvis 03/14/2022  Antimicrobials:  Anti-infectives (From admission, onward)    Start     Dose/Rate Route Frequency Ordered Stop   03/15/22 1600  azithromycin (ZITHROMAX) 500 mg in sodium chloride 0.9 %  250 mL IVPB  Status:  Discontinued        500 mg 250 mL/hr over 60 Minutes Intravenous Every 24 hours 03/14/22 2211 03/17/22 1759   03/15/22 1000  cefTRIAXone (ROCEPHIN) 2 g in sodium chloride 0.9 % 100 mL IVPB        2 g 200 mL/hr over 30 Minutes Intravenous Every 24 hours 03/14/22 2211 03/20/22 1520   03/15/22 1000  oseltamivir (TAMIFLU) capsule 30 mg        30 mg Oral Daily 03/15/22 0910 03/19/22 1200   03/14/22 1530  cefTRIAXone (ROCEPHIN) 1 g in sodium chloride 0.9 % 100 mL IVPB        1 g 200 mL/hr over 30 Minutes Intravenous  Once 03/14/22 1515 03/14/22 1606   03/14/22 1530  azithromycin (ZITHROMAX) 500 mg in sodium chloride 0.9 % 250 mL IVPB        500 mg 250 mL/hr over 60 Minutes Intravenous  Once 03/14/22 1515 03/14/22 1717        Subjective: Seen and examined at bedside and she is sitting in the chair and continues to have leg swelling.  She has been ambulating better and thinks that she is doing better and breathing little bit better.  No nausea or vomiting but continues to be weak.  Having some diarrhea so we will hold her scheduled laxatives.  No other concerns or complaints at this time.  Objective: Vitals:   03/22/22 2206 03/23/22 0454 03/23/22 0500 03/23/22 1500  BP: (!) 124/46 (!) 140/55  128/60  Pulse:  82  84  Resp:  17  16  Temp:  97.8 F (36.6 C)  98.6 F (37 C)  TempSrc:  Oral  Oral  SpO2:  97%  98%  Weight:   78 kg   Height:        Intake/Output Summary (Last 24 hours) at 03/23/2022 1729 Last data filed at 03/23/2022 1100 Gross per 24 hour  Intake 360 ml  Output --  Net 360 ml   Filed Weights   03/21/22 0500 03/22/22 0439 03/23/22 0500  Weight: 76.9 kg 77.7 kg 78 kg   Examination: Physical Exam:  Constitutional: WN/WD obese Caucasian female in NAD sitting in the chair at bedside Respiratory: Diminished to auscultation bilaterally with coarse breath sounds, no wheezing, rales, rhonchi or crackles. Normal respiratory effort and patient is not tachypenic. No accessory muscle use. Not wearing any Supplemental O2 via [place] Cardiovascular: RRR, no murmurs / rubs / gallops. S1 and S2 auscultated. 1+ LE edema  Abdomen: Soft, non-tender, Distended 2/2 body habitus. Bowel sounds positive.  GU: Deferred. Musculoskeletal: No clubbing / cyanosis of digits/nails. No joint deformity upper and lower extremities on a limited skin evaluation.  Skin: No rashes, lesions, ulcers on a limited skin evaluation. No induration; Warm and dry.  Neurologic: CN 2-12 grossly intact with no focal deficits. Romberg sign and cerebellar reflexes not assessed.  Psychiatric: Normal judgment and insight. Alert and oriented x 3. Normal mood and appropriate affect.   Data Reviewed: I have personally reviewed following labs and  imaging studies  CBC: Recent Labs  Lab 03/18/22 0443 03/19/22 0403 03/20/22 0445 03/21/22 0405 03/22/22 0518 03/23/22 1028  WBC 3.7* 3.1* 4.1 4.4 5.6 6.2  NEUTROABS 2.3  --  2.7 3.1 4.3 5.1  HGB 8.8* 8.2* 8.4* 8.1* 8.2* 8.7*  HCT 27.0* 25.8* 26.3* 24.7* 25.2* 27.7*  MCV 101.1* 102.4* 102.3* 101.6* 102.9* 107.4*  PLT 77* 78* 92* 90* 109* 106*   Basic  Metabolic Panel: Recent Labs  Lab 03/17/22 0423 03/18/22 0443 03/19/22 0403 03/20/22 0445 03/21/22 0405 03/22/22 0518 03/23/22 1028  NA 134*   < > 132* 133* 133* 133* 131*  K 3.8   < > 3.7 3.7 3.6 3.4* 4.5  CL 109   < > 108 108 108 107 105  CO2 17*   < > 17* 18* 17* 18* 19*  GLUCOSE 90   < > 84 100* 95 90 147*  BUN 46*   < > 42* 40* 40* 39* 46*  CREATININE 2.24*   < > 1.92* 1.92* 1.77* 1.95* 2.13*  CALCIUM 8.1*   < > 8.0* 8.3* 8.2* 8.4* 8.2*  MG 1.6*   < > 2.2 2.1 2.0 1.9 2.1  PHOS 3.3  --   --  3.3 3.2 3.6 3.4   < > = values in this interval not displayed.   GFR: Estimated Creatinine Clearance: 20.8 mL/min (A) (by C-G formula based on SCr of 2.13 mg/dL (H)). Liver Function Tests: Recent Labs  Lab 03/19/22 0403 03/20/22 0445 03/21/22 0405 03/22/22 0518 03/23/22 1028  AST 87* 95* 101* 173* 162*  ALT '27 30 30 '$ 38 41  ALKPHOS 129* 139* 145* 171* 177*  BILITOT 1.4* 1.6* 1.7* 1.7* 1.8*  PROT 5.0* 5.0* 4.8* 5.2* 5.2*  ALBUMIN 2.0* 2.1* 1.9* 2.2* 2.2*   No results for input(s): "LIPASE", "AMYLASE" in the last 168 hours. No results for input(s): "AMMONIA" in the last 168 hours. Coagulation Profile: No results for input(s): "INR", "PROTIME" in the last 168 hours. Cardiac Enzymes: No results for input(s): "CKTOTAL", "CKMB", "CKMBINDEX", "TROPONINI" in the last 168 hours. BNP (last 3 results) No results for input(s): "PROBNP" in the last 8760 hours. HbA1C: No results for input(s): "HGBA1C" in the last 72 hours. CBG: Recent Labs  Lab 03/16/22 2128 03/17/22 0749 03/17/22 1155 03/17/22 1638 03/17/22 2120  GLUCAP  138* 80 88 98 86   Lipid Profile: No results for input(s): "CHOL", "HDL", "LDLCALC", "TRIG", "CHOLHDL", "LDLDIRECT" in the last 72 hours. Thyroid Function Tests: No results for input(s): "TSH", "T4TOTAL", "FREET4", "T3FREE", "THYROIDAB" in the last 72 hours. Anemia Panel: No results for input(s): "VITAMINB12", "FOLATE", "FERRITIN", "TIBC", "IRON", "RETICCTPCT" in the last 72 hours. Sepsis Labs: Recent Labs  Lab 03/17/22 0423  LATICACIDVEN 2.6*   Recent Results (from the past 240 hour(s))  Resp panel by RT-PCR (RSV, Flu A&B, Covid) Anterior Nasal Swab     Status: Abnormal   Collection Time: 03/14/22 11:12 AM   Specimen: Anterior Nasal Swab  Result Value Ref Range Status   SARS Coronavirus 2 by RT PCR NEGATIVE NEGATIVE Final    Comment: (NOTE) SARS-CoV-2 target nucleic acids are NOT DETECTED.  The SARS-CoV-2 RNA is generally detectable in upper respiratory specimens during the acute phase of infection. The lowest concentration of SARS-CoV-2 viral copies this assay can detect is 138 copies/mL. A negative result does not preclude SARS-Cov-2 infection and should not be used as the sole basis for treatment or other patient management decisions. A negative result may occur with  improper specimen collection/handling, submission of specimen other than nasopharyngeal swab, presence of viral mutation(s) within the areas targeted by this assay, and inadequate number of viral copies(<138 copies/mL). A negative result must be combined with clinical observations, patient history, and epidemiological information. The expected result is Negative.  Fact Sheet for Patients:  EntrepreneurPulse.com.au  Fact Sheet for Healthcare Providers:  IncredibleEmployment.be  This test is no t yet approved or cleared by the [place]  FDA and  has been authorized for detection and/or diagnosis of SARS-CoV-2 by FDA under an Emergency Use Authorization (EUA). This EUA  will remain  in effect (meaning this test can be used) for the duration of the COVID-19 declaration under Section 564(b)(1) of the Act, 21 U.S.C.section 360bbb-3(b)(1), unless the authorization is terminated  or revoked sooner.       Influenza A by PCR POSITIVE (A) NEGATIVE Final   Influenza B by PCR NEGATIVE NEGATIVE Final    Comment: (NOTE) The Xpert Xpress SARS-CoV-2/FLU/RSV plus assay is intended as an aid in the diagnosis of influenza from Nasopharyngeal swab specimens and should not be used as a sole basis for treatment. Nasal washings and aspirates are unacceptable for Xpert Xpress SARS-CoV-2/FLU/RSV testing.  Fact Sheet for Patients: EntrepreneurPulse.com.au  Fact Sheet for Healthcare Providers: IncredibleEmployment.be  This test is not yet approved or cleared by the [place] FDA and has been authorized for detection and/or diagnosis of SARS-CoV-2 by FDA under an Emergency Use Authorization (EUA). This EUA will remain in effect (meaning this test can be used) for the duration of the COVID-19 declaration under Section 564(b)(1) of the Act, 21 U.S.C. section 360bbb-3(b)(1), unless the authorization is terminated or revoked.     Resp Syncytial Virus by PCR NEGATIVE NEGATIVE Final    Comment: (NOTE) Fact Sheet for Patients: EntrepreneurPulse.com.au  Fact Sheet for Healthcare Providers: IncredibleEmployment.be  This test is not yet approved or cleared by the [place] FDA and has been authorized for detection and/or diagnosis of SARS-CoV-2 by FDA under an Emergency Use Authorization (EUA). This EUA will remain in effect (meaning this test can be used) for the duration of the COVID-19 declaration under Section 564(b)(1) of the Act, 21 U.S.C. section 360bbb-3(b)(1), unless the authorization is terminated or revoked.  Performed at [facility], [place] [address] [place], [place] [address]   Culture, blood (Routine X 2) w Reflex to ID Panel     Status: None   Collection Time: 03/14/22  5:20 PM   Specimen: BLOOD  Result Value Ref Range Status   Specimen Description   Final    BLOOD LEFT ANTECUBITAL Performed at [facility] [address], [place], [place] [address]    Special Requests   Final    BOTTLES DRAWN AEROBIC AND ANAEROBIC Blood Culture adequate volume Performed at [facility] [address] [place], [place] [address]    Culture   Final    NO GROWTH 5 DAYS Performed at [facility], [place] [address], [place], [place] [address]    Report Status 03/19/2022 FINAL  Final  Culture, blood (Routine X 2) w Reflex to ID Panel     Status: None   Collection Time: 03/14/22 10:56 PM   Specimen: BLOOD  Result Value Ref Range Status   Specimen Description   Final    BLOOD BLOOD LEFT ARM Performed at [facility] [address], [place], [place] [address]    Special Requests   Final    BOTTLES DRAWN AEROBIC ONLY Blood Culture adequate volume Performed at [facility] [address] [place],  [address]    Culture   Final    NO GROWTH 5 DAYS Performed at [facility], [place] [address], [place],  [address]    Report Status 03/20/2022 FINAL  Final  Urine Culture     Status: None   Collection Time: 03/15/22  7:53 AM  Specimen: Urine, Clean Catch  Result Value Ref Range Status   Specimen Description   Final    URINE, CLEAN CATCH Performed at [facility], [place] [address], [place], [place] [address]    Special Requests   Final    NONE Performed at [facility], [place] [address] [place], [place] [address]    Culture   Final    NO GROWTH Performed at [facility], [place] [address], [place],  [address]    Report Status 03/16/2022 FINAL  Final    Radiology Studies: DG CHEST PORT 1  VIEW  Result Date: 03/22/2022 CLINICAL DATA:  Shortness of breath, cough. EXAM: PORTABLE CHEST 1 VIEW COMPARISON:  Chest x-rays dated 03/21/2022 and 03/20/2022 chest CT dated 03/14/2022. FINDINGS: Persistent airspace opacities at the bilateral lung bases, atelectasis and/or small pleural effusions, but improved aeration at the lung bases compared to the recent chest x-rays. No new lung findings. No pneumothorax is seen. Heart size and mediastinal contours appear stable. IMPRESSION: Improved aeration at the bilateral lung bases. Persistent airspace opacities at the lung bases, likely decreased atelectasis and/or small pleural effusions. Electronically Signed   By: Franki Cabot M.D.   On: 03/22/2022 09:40   ECHOCARDIOGRAM COMPLETE  Result Date: 03/22/2022    ECHOCARDIOGRAM REPORT   Patient Name:   Catherine Decker Date of Exam: 03/22/2022 Medical Rec #:  [id number]       Height:       63.0 in Accession #:    [id number]      Weight:       171.3 lb Date of Birth:  1940/08/21      BSA:          1.810 m Patient Age:    55 years        BP:           135/58 mmHg Patient Gender: F               HR:           66 bpm. Exam Location:  Inpatient Procedure: 2D Echo, Cardiac Doppler and Color Doppler Indications:    Bacteremia  History:        Patient has no prior history of Echocardiogram examinations.                 Risk Factors:Diabetes, Hypertension and Dyslipidemia. FLU                 positive. Breast cancer 2021. SDH.  Sonographer:    Roseanna Rainbow RDCS Referring Phys: [id number] [facility] LATIF [facility]  Sonographer Comments: Suboptimal parasternal window and suboptimal subcostal window. Could not turn due to multiple rib fractures. IMPRESSIONS  1. Left ventricular ejection fraction, by estimation, is 60 to 65%. The left ventricle has normal function. The left ventricle has no regional wall motion abnormalities. There is mild left ventricular hypertrophy. Left ventricular diastolic parameters are consistent with Grade I diastolic  dysfunction (impaired relaxation). Elevated left ventricular end-diastolic pressure.  2. Right ventricular systolic function is normal. The right ventricular size is normal.  3. The mitral valve is abnormal. No evidence of mitral valve regurgitation. No evidence of mitral stenosis.  4. The aortic valve is tricuspid. Aortic valve regurgitation is not visualized. No aortic stenosis is present.  5. The inferior vena cava is normal in size with greater than 50% respiratory variability, suggesting right atrial pressure of 3 mmHg. FINDINGS  Left Ventricle: Left ventricular ejection fraction,  by estimation, is 60 to 65%. The left ventricle has normal function. The left ventricle has no regional wall motion abnormalities. The left ventricular internal cavity size was normal in size. There is  mild left ventricular hypertrophy. Left ventricular diastolic parameters are consistent with Grade I diastolic dysfunction (impaired relaxation). Elevated left ventricular end-diastolic pressure. Right Ventricle: The right ventricular size is normal. No increase in right ventricular wall thickness. Right ventricular systolic function is normal. Left Atrium: Left atrial size was normal in size. Right Atrium: Right atrial size was normal in size. Pericardium: There is no evidence of pericardial effusion. Mitral Valve: The mitral valve is abnormal. There is mild thickening of the mitral valve leaflet(s). Mild mitral annular calcification. No evidence of mitral valve regurgitation. No evidence of mitral valve stenosis. Tricuspid Valve: The tricuspid valve is normal in structure. Tricuspid valve regurgitation is mild . No evidence of tricuspid stenosis. Aortic Valve: The aortic valve is tricuspid. Aortic valve regurgitation is not visualized. No aortic stenosis is present. Pulmonic Valve: The pulmonic valve was normal in structure. Pulmonic valve regurgitation is mild. No evidence of pulmonic stenosis. Aorta: The aortic root is normal in  size and structure. Venous: The inferior vena cava is normal in size with greater than 50% respiratory variability, suggesting right atrial pressure of 3 mmHg. IAS/Shunts: No atrial level shunt detected by color flow Doppler.  LEFT VENTRICLE PLAX 2D LVIDd:         4.30 cm   Diastology LVIDs:         2.25 cm   LV e' medial:    6.31 cm/s LV PW:         1.10 cm   LV E/e' medial:  14.5 LV IVS:        1.30 cm   LV e' lateral:   6.31 cm/s LVOT diam:     2.30 cm   LV E/e' lateral: 14.5 LV SV:         125 LV SV Index:   69 LVOT Area:     4.15 cm  RIGHT VENTRICLE             IVC RV S prime:     12.20 cm/s  IVC diam: 1.50 cm TAPSE (M-mode): 2.2 cm LEFT ATRIUM             Index        RIGHT ATRIUM           Index LA diam:        3.40 cm 1.88 cm/m   RA Area:     11.10 cm LA Vol (A2C):   29.9 ml 16.52 ml/m  RA Volume:   19.60 ml  10.83 ml/m LA Vol (A4C):   24.2 ml 13.37 ml/m LA Biplane Vol: 26.2 ml 14.47 ml/m  AORTIC VALVE LVOT Vmax:   145.00 cm/s LVOT Vmean:  104.000 cm/s LVOT VTI:    0.302 m  AORTA Ao Root diam: 3.70 cm Ao Asc diam:  3.30 cm MITRAL VALVE MV Area (PHT): 3.42 cm    SHUNTS MV Decel Time: 222 msec    Systemic VTI:  0.30 m MV E velocity: 91.80 cm/s  Systemic Diam: 2.30 cm MV A velocity: 95.50 cm/s MV E/A ratio:  0.96 Jenkins Rouge MD Electronically signed by Jenkins Rouge MD Signature Date/Time: 03/22/2022/9:30:38 AM    Final     Scheduled Meds:  acetaminophen  1,000 mg Oral Q6H   calcium carbonate  1,250 mg Oral Q breakfast  cholecalciferol  1,000 Units Oral Daily   cyanocobalamin  1,000 mcg Oral Daily   fluticasone  2 spray Each Nare Daily   folic acid  1 mg Oral Daily   guaiFENesin  1,200 mg Oral BID   hydrocortisone   Rectal TID   letrozole  2.5 mg Oral Daily   levothyroxine  112 mcg Oral Q0600   loratadine  10 mg Oral Daily   methocarbamol  1,000 mg Oral TID   pantoprazole  40 mg Oral Daily   sodium bicarbonate  650 mg Oral TID   Continuous Infusions:   LOS: 9 days   Raiford Noble,  DO [facility] Available via Epic secure chat 7am-7pm After these hours, please refer to coverage provider listed on [email or web address] 03/23/2022, 5:29 PM

## 2022-03-24 ENCOUNTER — Inpatient Hospital Stay (HOSPITAL_COMMUNITY): Payer: Medicare HMO

## 2022-03-24 DIAGNOSIS — W19XXXD Unspecified fall, subsequent encounter: Secondary | ICD-10-CM | POA: Diagnosis not present

## 2022-03-24 DIAGNOSIS — D61818 Other pancytopenia: Secondary | ICD-10-CM

## 2022-03-24 DIAGNOSIS — N179 Acute kidney failure, unspecified: Secondary | ICD-10-CM | POA: Diagnosis not present

## 2022-03-24 DIAGNOSIS — S22008A Other fracture of unspecified thoracic vertebra, initial encounter for closed fracture: Secondary | ICD-10-CM | POA: Diagnosis not present

## 2022-03-24 DIAGNOSIS — E039 Hypothyroidism, unspecified: Secondary | ICD-10-CM | POA: Diagnosis not present

## 2022-03-24 LAB — COMPREHENSIVE METABOLIC PANEL
ALT: 37 U/L (ref 0–44)
AST: 135 U/L — ABNORMAL HIGH (ref 15–41)
Albumin: 2 g/dL — ABNORMAL LOW (ref 3.5–5.0)
Alkaline Phosphatase: 162 U/L — ABNORMAL HIGH (ref 38–126)
Anion gap: 7 (ref 5–15)
BUN: 43 mg/dL — ABNORMAL HIGH (ref 8–23)
CO2: 20 mmol/L — ABNORMAL LOW (ref 22–32)
Calcium: 8.1 mg/dL — ABNORMAL LOW (ref 8.9–10.3)
Chloride: 104 mmol/L (ref 98–111)
Creatinine, Ser: 2.07 mg/dL — ABNORMAL HIGH (ref 0.44–1.00)
GFR, Estimated: 24 mL/min — ABNORMAL LOW (ref >60)
Glucose, Bld: 86 mg/dL (ref 70–99)
Potassium: 3.8 mmol/L (ref 3.5–5.1)
Sodium: 131 mmol/L — ABNORMAL LOW (ref 135–145)
Total Bilirubin: 1.9 mg/dL — ABNORMAL HIGH (ref 0.3–1.2)
Total Protein: 4.9 g/dL — ABNORMAL LOW (ref 6.5–8.1)

## 2022-03-24 LAB — CBC WITH DIFFERENTIAL/PLATELET
Abs Immature Granulocytes: 0.12 10*3/uL — ABNORMAL HIGH (ref 0.00–0.07)
Basophils Absolute: 0 10*3/uL (ref 0.0–0.1)
Basophils Relative: 0 %
Eosinophils Absolute: 0.1 10*3/uL (ref 0.0–0.5)
Eosinophils Relative: 2 %
HCT: 25.9 % — ABNORMAL LOW (ref 36.0–46.0)
Hemoglobin: 8.3 g/dL — ABNORMAL LOW (ref 12.0–15.0)
Immature Granulocytes: 2 %
Lymphocytes Relative: 11 %
Lymphs Abs: 0.6 10*3/uL — ABNORMAL LOW (ref 0.7–4.0)
MCH: 33.6 pg (ref 26.0–34.0)
MCHC: 32 g/dL (ref 30.0–36.0)
MCV: 104.9 fL — ABNORMAL HIGH (ref 80.0–100.0)
Monocytes Absolute: 0.2 10*3/uL (ref 0.1–1.0)
Monocytes Relative: 5 %
Neutro Abs: 4.1 10*3/uL (ref 1.7–7.7)
Neutrophils Relative %: 80 %
Platelets: 95 10*3/uL — ABNORMAL LOW (ref 150–400)
RBC: 2.47 MIL/uL — ABNORMAL LOW (ref 3.87–5.11)
RDW: 18.7 % — ABNORMAL HIGH (ref 11.5–15.5)
WBC: 5.1 10*3/uL (ref 4.0–10.5)
nRBC: 0 % (ref 0.0–0.2)

## 2022-03-24 LAB — MAGNESIUM: Magnesium: 2 mg/dL (ref 1.7–2.4)

## 2022-03-24 LAB — PHOSPHORUS: Phosphorus: 3.7 mg/dL (ref 2.5–4.6)

## 2022-03-24 MED ORDER — FUROSEMIDE 10 MG/ML IJ SOLN
40.0000 mg | Freq: Once | INTRAMUSCULAR | Status: AC
  Administered 2022-03-24: 40 mg via INTRAVENOUS
  Filled 2022-03-24: qty 4

## 2022-03-24 NOTE — Progress Notes (Signed)
PROGRESS NOTE    Catherine Decker  VHQ:[id number] DOB: Feb 27, 1941 DOA: 03/14/2022 PCP: Antony Contras, MD   Brief Narrative:  The Patient is an obese 82 year old female history of hypothyroidism, hypertension, as well as other comorbidities who presented to the ED after she fell down the stairs 2 days prior to admission slipped while walking hit her head on the way down but did not suffer any LOC. Patient was able to get up with help from a friend however decided to stay home. Patient's daughter noted that patient had more confusion and fatigue over the past 2 days prior to admission with decreased appetite and a 1-1/2-week history of cough and congestion and subsequently brought to the ED as patient with no clinical improvement.    Patient was seen in the ED, workup done concerning for subdural hematoma, chronic hygromas, multiple rib fractures, pneumonia, influenza A, AKI on CKD stage IIIb. Neurosurgery reviewed films and felt no further workup needed intervention at this time. Trauma surgery also assessed the patient and recommended pain management, pulmonary toileting. Patient admitted placed on empiric IV antibiotics and Tamiflu.  She is subsequently improving but renal function continues to be elevated and now slowly improving.  Will need to ensure adequate pain control.  PT OT still recommending SNF and will discharge when she is little bit more improved from a lab perspective as she is agreeable to SNF.   Legs remain swollen she is going to get another dose of IV Lasix but will need to watch her renal function carefully.   Will call and discuss with Dr. Lorenso Courier in the morning given that the CT scans that she had last week showed multiple metastatic lesions  Assessment and Plan:  Fall with associated multiple rib fractures Spinous process fracture of T1 Subdural hematoma/chronic hygromas -Secondary to mechanical fall. -Patient assessed by the trauma team who reviewed imaging and recommended  pain management, pulmonary toileting, incentive spirometry use secondary to rib fractures to prevent worsening pneumonia and no further intervention at this time. -Continue incentive spirometry, flutter valve, treatment of pneumonia, pain management with Methocarbamol 1000 mg po TID, scheduled Acetaminophen 1000 mg q6h. Oxycodone IR 2.5-5 mg q4prn Moderate to Severe Pain. -Films reviewed by neurosurgery who feel no intervention needed at this time. -PT/OT recommending SNF and patient is agreeable to go to SNF when she is diuresed and improved   Influenza A -Initiated Tamiflu and completed 5 Days -C/w Supportive Care    Right-sided Pneumonia -Noted on CT chest. -Patient with fall with rib fractures, likely can take deep inspiratory effort. -Urine strep pneumococcus antigen positive. -SpO2: 98 % -Urine Legionella antigen still pending. -Continue IV Rocephin to complete a 5-day course of treatment. -Discontinued IV Azithromycin. -Continue scheduled DuoNebs BID and q4hprn, Flonase, Claritin, PPI, Mucinex. -Repeat CXR in the AM; CXR this AM as below  Hypothyroidism -Continue home regimen of Levothyroxine 112 mcg po Daily.    AKI on CKD stage IIIb Metabolic Acidosis -Initially secondary to volume depletion, dehydration in the setting of ARB. -Renal ultrasound negative for hydronephrosis. -Urinalysis with concerns for UTI. -Urine sodium of 13, urine creatinine 195. -BUN/Cr Trend: Recent Labs  Lab 03/18/22 0443 03/19/22 0403 03/20/22 0445 03/21/22 0405 03/22/22 0518 03/23/22 1028 03/24/22 0425  BUN 42* 42* 40* 40* 39* 46* 43*  CREATININE 2.17* 1.92* 1.92* 1.77* 1.95* 2.13* 2.07*  -IVF Hydration has stopped currently and she had a low bit of fluid on her so will give her a little bit of Lasix -Patient still  has a slight metabolic acidosis with a CO2 of 20, anion gap of 7, chloride level 104 -Increased Sodium Bicarbonate 650 mg po TID the day before yesterday  -Continue to hold ARB,  avoid nephrotoxins.   -Monitor urine output.   -Likely will not resume ARB on discharge. -Avoid Further Nephrotoxic Medications, Contrast Dyes, Hypotension and Dehydration to Ensure Adequate Renal Perfusion and will need to Renally Adjust Meds -Continue to Monitor and Trend Renal Function carefully and repeat CMP in the AM    UTI -Urinalysis concerning for UTI as it showed a hazy appearance with amber color urine, moderate leukocytes, negative nitrites, many bacteria, 0-5 RBCs per high-power field, 21-50 WBCs. -Urine cultures negative.  -Patient empirically on IV Rocephin secondary to right-sided pneumonia.   Hyperlipidemia -Continue to hold home regimen for now   Volume Overload in setting of acute on Chronic Diastolic CHF -Check BNP and ECHO -BNP was 40.5 but not accurate as patient is obese -Echocardiogram done and showed "Left ventricular ejection fraction, by estimation, is 60 to 65%. The left ventricle has normal  function. The left ventricle has no  regional wall motion abnormalities. The left  ventricular internal cavity size was normal in size. There is   mild left ventricular hypertrophy. Left ventricular diastolic  parameters are consistent with Grade I diastolic dysfunction (impaired relaxation). Elevated left ventricular end-diastolic pressure. " -Given a dose of IV Lasix 40 mg x1 yesterday and will give again today given swelling again -Patient is +10.269 Liters  -Has bilateral Pleural Effusions -Continues to have  1+ LE edema -Continue to Monitor for S/Sx of Volume Overload -Repeat CXR this AM showed "No acute cardiopulmonary disease. Multiple displaced bilateral rib fractures unchanged."   Hyponatremia -Patient's Na+ Trend: Recent Labs  Lab 03/18/22 0443 03/19/22 0403 03/20/22 0445 03/21/22 0405 03/22/22 0518 03/23/22 1028 03/24/22 0425  NA 134* 132* 133* 133* 133* 131* 131*  -Continue to Monitor and Trend and Repeat CMP in the AM    History of Breast Cancer  Status postmastectomy/spinal lesions and now Left Iliac Wing Lytic Lesion -Imaging with T1 a right scapular lesion concerning for Metastasis. -CT Chest/Abd/Pelvs done and showed "Osseous metastasis, most apparent as a lytic lesion in the left iliac wing. Suspicion of right acromion process metastasis as well.  -Continue home regimen with Letrozole 2.5  mg po Daily.    -Dr. Lorenso Courier was notified and he feels that findings are concerning for recurrence of breast cancer with metastatic disease or this may be potentially a new primary cancer however CT scan did not show any evidence of new primary lesion.  This could also represent multiple myeloma and the best way to determine this will be all biopsy one of the bone lesion so oncology is ordering an IR guided biopsy of the lytic bone lesions as well as multiple myeloma panel, kappa and lambda light chains and LDH -Appreciate oncology evaluation and recommendations   Abnormal LFTs/Transaminitis -Likely secondary to history of cirrhosis. -Right upper quadrant ultrasound consistent with cirrhosis with portal hypertension, low-density structure in the left lobe of the liver highlighted on prior noncontrast CTs up oropharynx. -LFTs starting to worsen again as trend shows: Recent Labs  Lab 03/17/22 0423 03/19/22 0403 03/20/22 0445 03/21/22 0405 03/22/22 0518 03/23/22 1028 03/24/22 0425  AST 76* 87* 95* 101* 173* 162* 135*  ALT '28 27 30 30 '$ 38 41 37  -Outpatient follow-up.   Hypokalemia -K+ Trend: Recent Labs  Lab 03/18/22 0443 03/19/22 0403 03/20/22 0445 03/21/22 0405 03/22/22  0518 03/23/22 1028 03/24/22 0425  K 4.0 3.7 3.7 3.6 3.4* 4.5 3.8  -Continue to Monitor and Replete as Necessary -Repeat CMP in the AM    Liver Cirrhosis -Outpatient follow-up with primary hepatologist. -Explains elevated T Bili and AST -T. bili has gone from 1.4 -> 1.6 ->  1.7 x2 -> 1.8 -> 1.9 -She had a little bit of fluid in her legs so we will give her a dose  of Lasix again  -Continue to Monitor and Trend   Pancytopenia -Patient with a pancytopenia being followed by hematology/oncology in the outpatient setting. -CBC Trend: Recent Labs  Lab 03/18/22 0443 03/19/22 0403 03/20/22 0445 03/21/22 0405 03/22/22 0518 03/23/22 1028 03/24/22 0425  WBC 3.7* 3.1* 4.1 4.4 5.6 6.2 5.1  HGB 8.8* 8.2* 8.4* 8.1* 8.2* 8.7* 8.3*  HCT 27.0* 25.8* 26.3* 24.7* 25.2* 27.7* 25.9*  MCV 101.1* 102.4* 102.3* 101.6* 102.9* 107.4* 104.9*  PLT 77* 78* 92* 90* 109* 106* 95*  -Hgb/Hct is relatively stable and Plt Count and WBC count improving  -Anemia panel to be checked and oncology is ordering multiple myeloma panel -Patient with no S/Sx of bleeding. -Hematology/Oncology informed of admission via epic. -Will continue to Monitor and Trend    Lactic Acidosis -Likely secondary to acute infection of pneumonia and influenza A. -Lactic acid levels trending down and last check was 2.6 -The antibiotics and IV fluid have now stopped -Repeat labs in the AM.   Diabetes Mellitus Type 2 -Hemoglobin A1c 5.0 (11/05/2020). -CBG's ranging from 80-138 -Hemoglobin A1c 4.9.   -Continue to hold oral hypoglycemic agents.  -Discontinued SSI, CBGs.  -Outpatient follow-up.    Hypoalbuminemia -Patient's Albumin Level Trend: Recent Labs  Lab 03/17/22 0423 03/19/22 0403 03/20/22 0445 03/21/22 0405 03/22/22 0518 03/23/22 1028 03/24/22 0425  ALBUMIN 2.0* 2.0* 2.1* 1.9* 2.2* 2.2* 2.0*  -Continue to Monitor and Trend   Hypertension -BP stable and had been somewhat soft. -Continue to hold home regimen antihypertensive medications.   -IV fluids have now stopped  -Continue to Monitor BP per protocol -Last BP reading was 141/48   Pressure injury, stage I medial buttocks, POA Pressure Injury 03/14/22 Buttocks Medial;Mid Stage 1 -  Intact skin with non-blanchable redness of a localized area usually over a bony prominence. (Active)  03/14/22 2200  Location: Buttocks  Location  Orientation: Medial;Mid  Staging: Stage 1 -  Intact skin with non-blanchable redness of a localized area usually over a bony prominence.  Wound Description (Comments):   Present on Admission: Yes   Hyperbilirubinemia -Patient's T Bili went from 2.0 -> 1.5 -> 1.4 x2 -> 1.6 -> 1.7 x2 -> 1.8 -> 1.9 -Continue to Monitor and Trend -Repeat CMP in the AM    Obesity -Complicates overall prognosis and care -Estimated body mass index is 30.58 kg/m as calculated from the following:   Height as of this encounter: '5\' 3"'$  (1.6 m).   Weight as of this encounter: 78.3 kg.  -Weight Loss and Dietary Counseling given  DVT prophylaxis: SCDs Start: 03/14/22 2212 Place and maintain sequential compression device Start: 03/14/22 1706    Code Status: DNR Family Communication: Discussed with daughter at bedside  Disposition Plan:  Level of care: Progressive Status is: Inpatient Remains inpatient appropriate because: Needs further clinical improvement and clearance by oncology as they are going to work her up for her new lytic lesions   Consultants:  Oncology Dr. Mady Haagensen Surgery/Trauma Team Dr. Bobbye Morton Neurosurgery  Procedures:  CT head CT C-spine 03/14/2022 Chest x-ray 03/14/2022  Renal ultrasound 03/14/2022 Right upper quadrant ultrasound 03/15/2022 CT chest abdomen and pelvis 03/14/2022  Antimicrobials:  Anti-infectives (From admission, onward)    Start     Dose/Rate Route Frequency Ordered Stop   03/15/22 1600  azithromycin (ZITHROMAX) 500 mg in sodium chloride 0.9 % 250 mL IVPB  Status:  Discontinued        500 mg 250 mL/hr over 60 Minutes Intravenous Every 24 hours 03/14/22 2211 03/17/22 1759   03/15/22 1000  cefTRIAXone (ROCEPHIN) 2 g in sodium chloride 0.9 % 100 mL IVPB        2 g 200 mL/hr over 30 Minutes Intravenous Every 24 hours 03/14/22 2211 03/20/22 1520   03/15/22 1000  oseltamivir (TAMIFLU) capsule 30 mg        30 mg Oral Daily 03/15/22 0910 03/19/22 1200   03/14/22 1530   cefTRIAXone (ROCEPHIN) 1 g in sodium chloride 0.9 % 100 mL IVPB        1 g 200 mL/hr over 30 Minutes Intravenous  Once 03/14/22 1515 03/14/22 1606   03/14/22 1530  azithromycin (ZITHROMAX) 500 mg in sodium chloride 0.9 % 250 mL IVPB        500 mg 250 mL/hr over 60 Minutes Intravenous  Once 03/14/22 1515 03/14/22 1717       Subjective: Seen and examined at bedside and the patient was doing okay.  Thinks his breathing is doing better.  States that she did not sleep very well last night.  Continues to have some leg swelling.  Denies any chest pain.  No other concerns or complaints at this time.  Objective: Vitals:   03/23/22 1500 03/23/22 2129 03/24/22 0454 03/24/22 1405  BP: 128/60 (!) 103/43 110/74 (!) 141/48  Pulse: 84 83 81 90  Resp: '16 18 19 20  '$ Temp: 98.6 F (37 C) 98 F (36.7 C) 97.9 F (36.6 C) 98.1 F (36.7 C)  TempSrc: Oral Oral Oral Oral  SpO2: 98% 96% 94% 98%  Weight:   78.3 kg   Height:        Intake/Output Summary (Last 24 hours) at 03/24/2022 1828 Last data filed at 03/24/2022 1312 Gross per 24 hour  Intake 360 ml  Output --  Net 360 ml   Filed Weights   03/22/22 0439 03/23/22 0500 03/24/22 0454  Weight: 77.7 kg 78 kg 78.3 kg   Examination: Physical Exam:  Constitutional: WN/WD obese Caucasian female in no acute distress sitting up in the bed Respiratory: Diminished to auscultation bilaterally with coarse breath sounds and some slight crackles, no wheezing, rales, rhonchi. Normal respiratory effort and patient is not tachypenic. No accessory muscle use.  Unlabored breathing and not wearing supplemental oxygen via nasal cannula Cardiovascular: RRR, no murmurs / rubs / gallops. S1 and S2 auscultated.  Has 1+ lower extremity pitting edema Abdomen: Soft, non-tender, distended secondary to body habitus.  Bowel sounds positive.  GU: Deferred. Musculoskeletal: No clubbing / cyanosis of digits/nails. No joint deformity upper and lower extremities.  Skin: No rashes,  lesions, ulcers on limited skin evaluation. No induration; Warm and dry.  Neurologic: CN 2-12 grossly intact with no focal deficits. Romberg sign and cerebellar reflexes not assessed.  Psychiatric: Normal judgment and insight. Alert and oriented x 3. Normal mood and appropriate affect.   Data Reviewed: I have personally reviewed following labs and imaging studies  CBC: Recent Labs  Lab 03/20/22 0445 03/21/22 0405 03/22/22 0518 03/23/22 1028 03/24/22 0425  WBC 4.1 4.4 5.6 6.2 5.1  NEUTROABS 2.7 3.1  4.3 5.1 4.1  HGB 8.4* 8.1* 8.2* 8.7* 8.3*  HCT 26.3* 24.7* 25.2* 27.7* 25.9*  MCV 102.3* 101.6* 102.9* 107.4* 104.9*  PLT 92* 90* 109* 106* 95*   Basic Metabolic Panel: Recent Labs  Lab 03/20/22 0445 03/21/22 0405 03/22/22 0518 03/23/22 1028 03/24/22 0425  NA 133* 133* 133* 131* 131*  K 3.7 3.6 3.4* 4.5 3.8  CL 108 108 107 105 104  CO2 18* 17* 18* 19* 20*  GLUCOSE 100* 95 90 147* 86  BUN 40* 40* 39* 46* 43*  CREATININE 1.92* 1.77* 1.95* 2.13* 2.07*  CALCIUM 8.3* 8.2* 8.4* 8.2* 8.1*  MG 2.1 2.0 1.9 2.1 2.0  PHOS 3.3 3.2 3.6 3.4 3.7   GFR: Estimated Creatinine Clearance: 21.5 mL/min (A) (by C-G formula based on SCr of 2.07 mg/dL (H)). Liver Function Tests: Recent Labs  Lab 03/20/22 0445 03/21/22 0405 03/22/22 0518 03/23/22 1028 03/24/22 0425  AST 95* 101* 173* 162* 135*  ALT 30 30 38 41 37  ALKPHOS 139* 145* 171* 177* 162*  BILITOT 1.6* 1.7* 1.7* 1.8* 1.9*  PROT 5.0* 4.8* 5.2* 5.2* 4.9*  ALBUMIN 2.1* 1.9* 2.2* 2.2* 2.0*   No results for input(s): "LIPASE", "AMYLASE" in the last 168 hours. No results for input(s): "AMMONIA" in the last 168 hours. Coagulation Profile: No results for input(s): "INR", "PROTIME" in the last 168 hours. Cardiac Enzymes: No results for input(s): "CKTOTAL", "CKMB", "CKMBINDEX", "TROPONINI" in the last 168 hours. BNP (last 3 results) No results for input(s): "PROBNP" in the last 8760 hours. HbA1C: No results for input(s): "HGBA1C" in  the last 72 hours. CBG: Recent Labs  Lab 03/17/22 2120  GLUCAP 86   Lipid Profile: No results for input(s): "CHOL", "HDL", "LDLCALC", "TRIG", "CHOLHDL", "LDLDIRECT" in the last 72 hours. Thyroid Function Tests: No results for input(s): "TSH", "T4TOTAL", "FREET4", "T3FREE", "THYROIDAB" in the last 72 hours. Anemia Panel: No results for input(s): "VITAMINB12", "FOLATE", "FERRITIN", "TIBC", "IRON", "RETICCTPCT" in the last 72 hours. Sepsis Labs: No results for input(s): "PROCALCITON", "LATICACIDVEN" in the last 168 hours.  Recent Results (from the past 240 hour(s))  Culture, blood (Routine X 2) w Reflex to ID Panel     Status: None   Collection Time: 03/14/22 10:56 PM   Specimen: BLOOD  Result Value Ref Range Status   Specimen Description   Final    BLOOD BLOOD LEFT ARM Performed at [facility] [address] [place], [place] [address]    Special Requests   Final    BOTTLES DRAWN AEROBIC ONLY Blood Culture adequate volume Performed at [facility] [address], [place], [place] [address]    Culture   Final    NO GROWTH 5 DAYS Performed at [facility], [place] [address], [place], [place] [address]    Report Status 03/20/2022 FINAL  Final  Urine Culture     Status: None   Collection Time: 03/15/22  7:53 AM   Specimen: Urine, Clean Catch  Result Value Ref Range Status   Specimen Description   Final    URINE, CLEAN CATCH Performed at [facility], [place] [address] [place], [place] [address]    Special Requests   Final    NONE Performed at [facility], [place] [address] [place], [place] [address]    Culture   Final    NO GROWTH Performed at [facility], [place] [address] [place], [place] [address]    Report Status 03/16/2022 FINAL  Final    Radiology Studies: DG CHEST PORT 1 VIEW  Result Date: 03/24/2022 CLINICAL DATA:  Shortness of breath. EXAM: PORTABLE CHEST 1 VIEW COMPARISON:   03/22/2022 FINDINGS: Lungs are adequately inflated without focal airspace consolidation or effusion. Cardiomediastinal silhouette is normal. Surgical clips over the right axilla. Talus device overlies the right upper quadrant. There are multiple displaced left lateral rib fractures as well as displaced right lateral rib fractures unchanged. IMPRESSION: 1. No acute cardiopulmonary disease. 2. Multiple displaced bilateral rib fractures unchanged. Electronically Signed   By: Marin Olp M.D.   On: 03/24/2022 08:19    Scheduled Meds:  acetaminophen  1,000 mg Oral Q6H   calcium carbonate  1,250 mg Oral Q breakfast   cholecalciferol  1,000 Units Oral Daily   cyanocobalamin  1,000 mcg Oral Daily   fluticasone  2 spray Each Nare Daily   folic acid  1 mg Oral Daily   guaiFENesin  1,200 mg Oral BID   hydrocortisone   Rectal TID   letrozole  2.5 mg Oral Daily   levothyroxine  112 mcg Oral Q0600   loratadine  10 mg Oral Daily   methocarbamol  1,000 mg Oral TID   pantoprazole  40 mg Oral Daily   sodium bicarbonate  650 mg Oral TID   Continuous Infusions:   LOS: 10 days   Raiford Noble, DO [facility] Available via Epic secure chat 7am-7pm After these hours, please refer to coverage provider listed on [email or web address] 03/24/2022, 6:28 PM

## 2022-03-24 NOTE — Progress Notes (Signed)
Physical Therapy Treatment Patient Details Name: Catherine Decker MRN: [id number] DOB: 04-06-1940 Today's Date: 03/24/2022   History of Present Illness Patient is a 82 year old female who presented after a fall down some stairs. Patient was found to have left rib fractures 4-5, right rib fractures 7-8, T7 compression fx, T1 spinous process fx, SDH.  Pt also found to have influenza A. KDT:OIZTI breast cancer, arthritis, hyperlipidemia, splenic artery aneurysm    PT Comments    General Comments: AxO x 3 slow to move and slow weak voice.  Pleasant and willing.  Prior amb with a Rollator and stated she lives with her Daughter Step father because years ago she moved down here to [place] to care for his wife who ended up passing on. Assisted OOB to amb was limited and required increased time.  General bed mobility comments: required increased time and assist this session esp back to bed to support B LE "heavy" stated pt.  General transfer comment: Min Asisst to rise and steady with 25% VC's on proper hand placement and safety with turns.  Slow moving.  Feels "weak".  HIGH FALL RISK General Gait Details: limited amb distance 12 feet due to MAX c/o weakness/fatigue.  Chair following as a precaution.  C/O mild dizziness.  BP 135/64.  Pt stated the dizziness has been on going "since I got sick". Pt will need ST Rehab at SNF to address mobility and functional decline prior to safely returning home.   Recommendations for follow up therapy are one component of a multi-disciplinary discharge planning process, led by the attending physician.  Recommendations may be updated based on patient status, additional functional criteria and insurance authorization.  Follow Up Recommendations  Skilled nursing-short term rehab (<3 hours/day) Can patient physically be transported by private vehicle: Yes   Assistance Recommended at Discharge Intermittent Supervision/Assistance  Patient can return home with the following  Assistance with cooking/housework;Assist for transportation;Help with stairs or ramp for entrance   Equipment Recommendations  None recommended by PT    Recommendations for Other Services       Precautions / Restrictions Precautions Precautions: Fall Precaution Comments: rib fractures, back preacutions Restrictions Weight Bearing Restrictions: No     Mobility  Bed Mobility Overal bed mobility: Needs Assistance Bed Mobility: Supine to Sit     Supine to sit: Min guard, Min assist Sit to supine: Min guard, Min assist   General bed mobility comments: required increased time and assist this session esp back to bed to support B LE "heavy" stated pt.    Transfers Overall transfer level: Needs assistance Equipment used: Rolling walker (2 wheels) Transfers: Sit to/from Stand Sit to Stand: Min guard, Min assist           General transfer comment: Min Asisst to rise and steady with 25% VC's on proper hand placement and safety with turns.  Slow moving.  Feels "weak".  HIGH FALL RISK    Ambulation/Gait Ambulation/Gait assistance: Min guard, Min assist Gait Distance (Feet): 24 Feet (12 feet X 2) Assistive device: Rolling walker (2 wheels) Gait Pattern/deviations: Step-through pattern, Decreased stride length, Trunk flexed Gait velocity: decreased     General Gait Details: limited amb distance 12 feet due to MAX c/o weakness/fatigue.  Chair following as a precaution.  C/O mild dizziness.  BP 135/64.  Pt stated the dizziness has been on going "since I got sick".   Stairs             Emergency planning/management officer  Modified Rankin (Stroke Patients Only)       Balance                                            Cognition Arousal/Alertness: Awake/alert Behavior During Therapy: WFL for tasks assessed/performed Overall Cognitive Status: Within Functional Limits for tasks assessed                                 General Comments: AxO x 3  slow to move and slow weak voice.  Pleasant and willing.  Prior amb with a Rollator and stated she lives with her Daughter Step father because years ago she moved down here to [place] to care for his wife who ended up passing on.        Exercises      General Comments        Pertinent Vitals/Pain Pain Assessment Pain Assessment: Faces Faces Pain Scale: Hurts a little bit Pain Location: R ribs with activity Pain Descriptors / Indicators: Discomfort, Grimacing Pain Intervention(s): Monitored during session    Home Living                          Prior Function            PT Goals (current goals can now be found in the care plan section) Progress towards PT goals: Progressing toward goals    Frequency    Min 3X/week      PT Plan Current plan remains appropriate    Co-evaluation              AM-PAC PT "6 Clicks" Mobility   Outcome Measure  Help needed turning from your back to your side while in a flat bed without using bedrails?: A Little Help needed moving from lying on your back to sitting on the side of a flat bed without using bedrails?: A Little Help needed moving to and from a bed to a chair (including a wheelchair)?: A Little Help needed standing up from a chair using your arms (e.g., wheelchair or bedside chair)?: A Lot Help needed to walk in hospital room?: A Lot Help needed climbing 3-5 steps with a railing? : A Lot 6 Click Score: 15    End of Session Equipment Utilized During Treatment: Gait belt Activity Tolerance: Patient limited by fatigue;Patient tolerated treatment well Patient left: with call bell/phone within reach;in bed;with bed alarm set Nurse Communication: Mobility status PT Visit Diagnosis: Difficulty in walking, not elsewhere classified (R26.2);Unsteadiness on feet (R26.81)     Time: 3435-6861 PT Time Calculation (min) (ACUTE ONLY): 25 min  Charges:  $Gait Training: 8-22 mins $Therapeutic Activity: 8-22 mins                      Rica Koyanagi  PTA Acute  Rehabilitation Services Office M-F          [phone] Weekend pager [phone]

## 2022-03-24 NOTE — Progress Notes (Signed)
Hematology/Oncology Progress Note  Clinical Summary: Ms. Meher Kucinski is an 82 year old female with medical history significant for cirrhosis and recurrent breast cancer who presents for a fall from standing with subsequent subdural hematoma.  Interval History: -- Patient was admitted on 03/14/2022. -- CT chest abdomen pelvis on 03/14/2022 showed osseous metastasis, most apparent as lytic lesion in the left iliac wing.  Also right acromion process metastasis.  There were also noted to be bilateral rib fractures and thoracolumbar compression deformities, cannot exclude pathological fracture at T7. -- Labs today show white blood cell count 5.1, hemoglobin 8.3, MCV 104.9, and platelets of 95. -- Patient notes that she is not currently in any pain. --Currently working with physical therapy and considering discharge to rehab facility. --Patient denies any fevers, chills, sweats, nausea, vomiting or diarrhea.  O:  Vitals:   03/24/22 0454 03/24/22 1405  BP: 110/74 (!) 141/48  Pulse: 81 90  Resp: 19 20  Temp: 97.9 F (36.6 C) 98.1 F (36.7 C)  SpO2: 94% 98%      Latest Ref Rng & Units 03/24/2022    4:25 AM 03/23/2022   10:28 AM 03/22/2022    5:18 AM  CMP  Glucose 70 - 99 mg/dL 86  147  90   BUN 8 - 23 mg/dL 43  46  39   Creatinine 0.44 - 1.00 mg/dL 2.07  2.13  1.95   Sodium 135 - 145 mmol/L 131  131  133   Potassium 3.5 - 5.1 mmol/L 3.8  4.5  3.4   Chloride 98 - 111 mmol/L 104  105  107   CO2 22 - 32 mmol/L '20  19  18   '$ Calcium 8.9 - 10.3 mg/dL 8.1  8.2  8.4   Total Protein 6.5 - 8.1 g/dL 4.9  5.2  5.2   Total Bilirubin 0.3 - 1.2 mg/dL 1.9  1.8  1.7   Alkaline Phos 38 - 126 U/L 162  177  171   AST 15 - 41 U/L 135  162  173   ALT 0 - 44 U/L 37  41  38       Latest Ref Rng & Units 03/24/2022    4:25 AM 03/23/2022   10:28 AM 03/22/2022    5:18 AM  CBC  WBC 4.0 - 10.5 K/uL 5.1  6.2  5.6   Hemoglobin 12.0 - 15.0 g/dL 8.3  8.7  8.2   Hematocrit 36.0 - 46.0 % 25.9  27.7  25.2   Platelets  150 - 400 K/uL 95  106  109       GENERAL: well appearing elderly Caucasian female in NAD  SKIN: skin color, texture, turgor are normal, no rashes or significant lesions EYES: conjunctiva are pink and non-injected, sclera clear LUNGS: clear to auscultation and percussion with normal breathing effort HEART: regular rate & rhythm and no murmurs and no lower extremity edema Musculoskeletal: no cyanosis of digits and no clubbing  PSYCH: alert & oriented x 3, fluent speech NEURO: no focal motor/sensory deficits  Assessment/Plan: Ms. Myra Weng is an 82 year old female with medical history significant for cirrhosis and recurrent breast cancer who presents for a fall from standing with subsequent subdural hematoma.  At this time findings are concerning for recurrent breast cancer with metastatic disease.  This may potentially represent a new primary, however CT scan did not show any evidence of a new primary lesion.  Additionally this may represent multiple myeloma.  The best way to determine this would  be to perform a biopsy of one of the bone lesions.  We will order serological studies as well in order to assess for other possible causes.  The patient voiced understanding of the plan moving forward.  # Lytic Bone Lesions -- Due to history of breast cancer high concern for recurrence. -- Requesting IR guided biopsy of one of the lytic lesions of the bone. -- Will also order multiple myeloma panel, kappa and lambda light chains, and LDH. -- Oncology service will continue to follow.  # Macrocytic Anemia #Thrombocytopenia -- Anemia workup with iron panel, ferritin, vitamin B12, and folate. --MM workup as above.   Ledell Peoples, MD Department of Hematology/Oncology [place] [facility] Phone: [phone] Pager: [phone] Email: [email or web address]

## 2022-03-24 NOTE — Progress Notes (Signed)
Mobility Specialist - Progress Note   03/24/22 0914  Mobility  Activity Transferred to/from [facility]  Level of Assistance Contact guard assist, steadying assist  Assistive Device Other (Comment) (HHA)  Activity Response Tolerated well  Mobility Referral Yes  $Mobility charge 1 Mobility   Pt was found in bed and requesting to use BSC. No complaints and at EOS was left with all necessities in reach.  Ferd Hibbs Mobility Specialist

## 2022-03-25 ENCOUNTER — Inpatient Hospital Stay (HOSPITAL_COMMUNITY): Payer: Medicare HMO

## 2022-03-25 DIAGNOSIS — N179 Acute kidney failure, unspecified: Secondary | ICD-10-CM | POA: Diagnosis not present

## 2022-03-25 DIAGNOSIS — W19XXXD Unspecified fall, subsequent encounter: Secondary | ICD-10-CM | POA: Diagnosis not present

## 2022-03-25 DIAGNOSIS — E039 Hypothyroidism, unspecified: Secondary | ICD-10-CM | POA: Diagnosis not present

## 2022-03-25 DIAGNOSIS — S22008A Other fracture of unspecified thoracic vertebra, initial encounter for closed fracture: Secondary | ICD-10-CM | POA: Diagnosis not present

## 2022-03-25 LAB — PROTIME-INR
INR: 1.4 — ABNORMAL HIGH (ref 0.8–1.2)
Prothrombin Time: 17.2 seconds — ABNORMAL HIGH (ref 11.4–15.2)

## 2022-03-25 LAB — VITAMIN B12: Vitamin B-12: 2501 pg/mL — ABNORMAL HIGH (ref 180–914)

## 2022-03-25 LAB — CBC WITH DIFFERENTIAL/PLATELET
Abs Immature Granulocytes: 0.11 10*3/uL — ABNORMAL HIGH (ref 0.00–0.07)
Basophils Absolute: 0 10*3/uL (ref 0.0–0.1)
Basophils Relative: 1 %
Eosinophils Absolute: 0.1 10*3/uL (ref 0.0–0.5)
Eosinophils Relative: 2 %
HCT: 27.8 % — ABNORMAL LOW (ref 36.0–46.0)
Hemoglobin: 9 g/dL — ABNORMAL LOW (ref 12.0–15.0)
Immature Granulocytes: 2 %
Lymphocytes Relative: 13 %
Lymphs Abs: 1 10*3/uL (ref 0.7–4.0)
MCH: 33.8 pg (ref 26.0–34.0)
MCHC: 32.4 g/dL (ref 30.0–36.0)
MCV: 104.5 fL — ABNORMAL HIGH (ref 80.0–100.0)
Monocytes Absolute: 0.3 10*3/uL (ref 0.1–1.0)
Monocytes Relative: 5 %
Neutro Abs: 5.7 10*3/uL (ref 1.7–7.7)
Neutrophils Relative %: 77 %
Platelets: 121 10*3/uL — ABNORMAL LOW (ref 150–400)
RBC: 2.66 MIL/uL — ABNORMAL LOW (ref 3.87–5.11)
RDW: 19.3 % — ABNORMAL HIGH (ref 11.5–15.5)
WBC: 7.3 10*3/uL (ref 4.0–10.5)
nRBC: 0.3 % — ABNORMAL HIGH (ref 0.0–0.2)

## 2022-03-25 LAB — FERRITIN: Ferritin: 303 ng/mL (ref 11–307)

## 2022-03-25 LAB — COMPREHENSIVE METABOLIC PANEL
ALT: 41 U/L (ref 0–44)
AST: 147 U/L — ABNORMAL HIGH (ref 15–41)
Albumin: 2.3 g/dL — ABNORMAL LOW (ref 3.5–5.0)
Alkaline Phosphatase: 213 U/L — ABNORMAL HIGH (ref 38–126)
Anion gap: 7 (ref 5–15)
BUN: 44 mg/dL — ABNORMAL HIGH (ref 8–23)
CO2: 18 mmol/L — ABNORMAL LOW (ref 22–32)
Calcium: 7.9 mg/dL — ABNORMAL LOW (ref 8.9–10.3)
Chloride: 104 mmol/L (ref 98–111)
Creatinine, Ser: 2.06 mg/dL — ABNORMAL HIGH (ref 0.44–1.00)
GFR, Estimated: 24 mL/min — ABNORMAL LOW (ref >60)
Glucose, Bld: 85 mg/dL (ref 70–99)
Potassium: 3.8 mmol/L (ref 3.5–5.1)
Sodium: 129 mmol/L — ABNORMAL LOW (ref 135–145)
Total Bilirubin: 1.9 mg/dL — ABNORMAL HIGH (ref 0.3–1.2)
Total Protein: 6 g/dL — ABNORMAL LOW (ref 6.5–8.1)

## 2022-03-25 LAB — IRON AND TIBC
Iron: 58 ug/dL (ref 28–170)
Saturation Ratios: 28 % (ref 10.4–31.8)
TIBC: 204 ug/dL — ABNORMAL LOW (ref 250–450)
UIBC: 146 ug/dL

## 2022-03-25 LAB — MAGNESIUM: Magnesium: 1.9 mg/dL (ref 1.7–2.4)

## 2022-03-25 LAB — PHOSPHORUS: Phosphorus: 4.1 mg/dL (ref 2.5–4.6)

## 2022-03-25 LAB — RETICULOCYTES
Immature Retic Fract: 25.9 % — ABNORMAL HIGH (ref 2.3–15.9)
RBC.: 2.38 MIL/uL — ABNORMAL LOW (ref 3.87–5.11)
Retic Count, Absolute: 136.4 10*3/uL (ref 19.0–186.0)
Retic Ct Pct: 5.7 % — ABNORMAL HIGH (ref 0.4–3.1)

## 2022-03-25 LAB — LACTATE DEHYDROGENASE: LDH: 256 U/L — ABNORMAL HIGH (ref 98–192)

## 2022-03-25 LAB — FOLATE: Folate: 11.9 ng/mL (ref >5.9)

## 2022-03-25 MED ORDER — FENTANYL CITRATE (PF) 100 MCG/2ML IJ SOLN
INTRAMUSCULAR | Status: AC | PRN
  Administered 2022-03-25: 50 ug via INTRAVENOUS

## 2022-03-25 MED ORDER — FENTANYL CITRATE (PF) 100 MCG/2ML IJ SOLN
INTRAMUSCULAR | Status: AC
  Filled 2022-03-25: qty 2

## 2022-03-25 MED ORDER — LIDOCAINE HCL (PF) 1 % IJ SOLN
INTRAMUSCULAR | Status: AC | PRN
  Administered 2022-03-25: 15 mL

## 2022-03-25 MED ORDER — MIDAZOLAM HCL 2 MG/2ML IJ SOLN
INTRAMUSCULAR | Status: AC
  Filled 2022-03-25: qty 2

## 2022-03-25 MED ORDER — LETROZOLE 2.5 MG PO TABS
2.5000 mg | ORAL_TABLET | ORAL | Status: DC
  Administered 2022-03-27 – 2022-04-02 (×4): 2.5 mg via ORAL
  Filled 2022-03-25 (×5): qty 1

## 2022-03-25 MED ORDER — MIDAZOLAM HCL 2 MG/2ML IJ SOLN
INTRAMUSCULAR | Status: AC | PRN
  Administered 2022-03-25 (×2): .5 mg via INTRAVENOUS

## 2022-03-25 MED ORDER — FUROSEMIDE 10 MG/ML IJ SOLN
40.0000 mg | Freq: Once | INTRAMUSCULAR | Status: AC
  Administered 2022-03-25: 40 mg via INTRAVENOUS
  Filled 2022-03-25: qty 4

## 2022-03-25 NOTE — Sedation Documentation (Signed)
Pt states she has been given fentanyl without any problems.

## 2022-03-25 NOTE — TOC Progression Note (Signed)
Transition of Care [facility]) - Progression Note    Patient Details  Name: RAYETTA VEITH MRN: [id number] Date of Birth: 1940/11/24  Transition of Care [facility] CM/SW [place], LCSW Phone Number: 03/25/2022, 9:17 AM  Clinical Narrative:     Pt's insurance authorization for skilled nursing will expire today , will need new auth if no d/c today. TOC to follow.   Expected Discharge Plan: [place] Barriers to Discharge: Continued Medical Work up  Expected Discharge Plan and Services       Living arrangements for the past 2 months: Single Family Home                                       Social Determinants of Health (SDOH) Interventions SDOH Screenings   Food Insecurity: No Food Insecurity (03/14/2022)  Housing: Low Risk  (03/14/2022)  Transportation Needs: No Transportation Needs (03/14/2022)  Utilities: Not At Risk (03/14/2022)  Tobacco Use: Low Risk  (03/14/2022)    Readmission Risk Interventions     No data to display

## 2022-03-25 NOTE — Progress Notes (Signed)
PROGRESS NOTE    Catherine Decker  ZSW:[id number] DOB: 10-Feb-1941 DOA: 03/14/2022 PCP: Antony Contras, MD   Brief Narrative:  The Patient is an obese 82 year old female history of hypothyroidism, hypertension, as well as other comorbidities who presented to the ED after she fell down the stairs 2 days prior to admission slipped while walking hit her head on the way down but did not suffer any LOC. Patient was able to get up with help from a friend however decided to stay home. Patient's daughter noted that patient had more confusion and fatigue over the past 2 days prior to admission with decreased appetite and a 1-1/2-week history of cough and congestion and subsequently brought to the ED as patient with no clinical improvement.    Patient was seen in the ED, workup done concerning for subdural hematoma, chronic hygromas, multiple rib fractures, pneumonia, influenza A, AKI on CKD stage IIIb. Neurosurgery reviewed films and felt no further workup needed intervention at this time. Trauma surgery also assessed the patient and recommended pain management, pulmonary toileting. Patient admitted placed on empiric IV antibiotics and Tamiflu.  She is subsequently improving but renal function continues to be elevated and now slowly improving.  Will need to ensure adequate pain control.  PT OT still recommending SNF and will discharge when she is little bit more improved from a lab perspective as she is agreeable to SNF.   Legs remain swollen she is going to get another dose of IV Lasix but will need to watch her renal function carefully.   Discussed with Dr. Lorenso Courier yesterday in the morning given that the CT scans that she had last week showed multiple metastatic lesions and he recommended further workup and obtaining a CT-guided biopsy of the left iliac bone marrow biopsy.  She still is a little volume overloaded so we will give her another dose of IV Lasix  Assessment and Plan:  Fall with associated multiple  rib fractures Spinous process fracture of T1 Subdural hematoma/chronic hygromas -Secondary to mechanical fall. -Patient assessed by the trauma team who reviewed imaging and recommended pain management, pulmonary toileting, incentive spirometry use secondary to rib fractures to prevent worsening pneumonia and no further intervention at this time. -Continue incentive spirometry, flutter valve, treatment of pneumonia, pain management with Methocarbamol 1000 mg po TID, scheduled Acetaminophen 1000 mg q6h. Oxycodone IR 2.5-5 mg q4prn Moderate to Severe Pain. -Films reviewed by neurosurgery who feel no intervention needed at this time. -PT/OT recommending SNF and patient is agreeable to go to SNF when she is diuresed and improved and fully worked up by Oncology   Influenza A -Initiated Tamiflu and completed 5 Days -C/w Supportive Care    Right-sided Pneumonia -Noted on CT chest. -Patient with fall with rib fractures, likely can take deep inspiratory effort. -Urine strep pneumococcus antigen positive. SpO2: 100 % O2 Flow Rate (L/min): 2 L/min -Urine Legionella antigen still pending. -Continue IV Rocephin to complete a 5-day course of treatment. -Discontinued IV Azithromycin. -Continue scheduled DuoNebs BID and q4hprn, Flonase, Claritin, PPI, Mucinex. -Repeat CXR in the AM; CXR this AM as below   Hypothyroidism -Continue home regimen of Levothyroxine 112 mcg po Daily.    AKI on CKD stage IIIb Metabolic Acidosis -Initially secondary to volume depletion, dehydration in the setting of ARB. -Renal ultrasound negative for hydronephrosis. -Urinalysis with concerns for UTI. -Urine sodium of 13, urine creatinine 195. -BUN/Cr Trend: Recent Labs  Lab 03/19/22 0403 03/20/22 0445 03/21/22 0405 03/22/22 0518 03/23/22 1028 03/24/22 0425 03/25/22  1019  BUN 42* 40* 40* 39* 46* 43* 44*  CREATININE 1.92* 1.92* 1.77* 1.95* 2.13* 2.07* 2.06*  -IVF Hydration has stopped currently and she had a low  bit of fluid on her so will give her a little bit of Lasix -Patient still has a slight metabolic acidosis with a CO2 of 20, anion gap of 7, chloride level 104 -Increased Sodium Bicarbonate 650 mg po TID the day before yesterday  -Continue to hold ARB, avoid nephrotoxins.   -Monitor urine output.   -Likely will not resume ARB on discharge. -Avoid Further Nephrotoxic Medications, Contrast Dyes, Hypotension and Dehydration to Ensure Adequate Renal Perfusion and will need to Renally Adjust Meds -Continue to Monitor and Trend Renal Function carefully and repeat CMP in the AM    UTI -Urinalysis concerning for UTI as it showed a hazy appearance with amber color urine, moderate leukocytes, negative nitrites, many bacteria, 0-5 RBCs per high-power field, 21-50 WBCs. -Urine cultures negative.  -Patient empirically on IV Rocephin secondary to right-sided pneumonia.   Hyperlipidemia -Continue to hold home regimen for now   Volume Overload in setting of acute on Chronic Diastolic CHF -Check BNP and ECHO -BNP was 40.5 but not accurate as patient is obese -Echocardiogram done and showed "Left ventricular ejection fraction, by estimation, is 60 to 65%. The left ventricle has normal  function. The left ventricle has no  regional wall motion abnormalities. The left  ventricular internal cavity size was normal in size. There is   mild left ventricular hypertrophy. Left ventricular diastolic  parameters are consistent with Grade I diastolic dysfunction (impaired relaxation). Elevated left ventricular end-diastolic pressure. " -Given a dose of IV Lasix 40 mg x1 yesterday and will give again today given swelling again -Patient is +10.749 Liters  -Had bilateral Pleural Effusions but these seem to have improved -Continues to have  1+ LE edema -Continue to Monitor for S/Sx of Volume Overload -Repeat CXR yesterday AM showed "No acute cardiopulmonary disease. Multiple displaced bilateral rib fractures  unchanged." -Repeat chest x-ray in the a.m.   Hyponatremia -Patient's Na+ Trend: Recent Labs  Lab 03/19/22 0403 03/20/22 0445 03/21/22 0405 03/22/22 0518 03/23/22 1028 03/24/22 0425 03/25/22 1019  NA 132* 133* 133* 133* 131* 131* 129*  -Continue to Monitor and Trend and Repeat CMP in the AM    History of Breast Cancer Status postmastectomy/spinal lesions and now Left Iliac Wing Lytic Lesion -Imaging with T1 a right scapular lesion concerning for Metastasis. -CT Chest/Abd/Pelvs done and showed "Osseous metastasis, most apparent as a lytic lesion in the left iliac wing. Suspicion of right acromion process metastasis as well.  -Continue home regimen with Letrozole 2.5  mg po Daily.    -Dr. Lorenso Courier was notified and he feels that findings are concerning for recurrence of breast cancer with metastatic disease or this may be potentially a new primary cancer however CT scan did not show any evidence of new primary lesion.  This could also represent multiple myeloma and the best way to determine this will be all biopsy one of the bone lesion so oncology is ordering an IR guided biopsy of the lytic bone lesions as well as multiple myeloma panel, kappa and lambda light chains and LDH; LDH was 256 -Appreciate oncology evaluation and recommendations due to her history of breast cancer there is high concern for recurrence and they are requesting an IR guided biopsy of the lytic lesions of the bone and also ordered a multiple myeloma panel, kappa and lambda light chains  as well as an LDH and she is going to get an anemia panel as well   Abnormal LFTs/Transaminitis -Likely secondary to history of cirrhosis. -Right upper quadrant ultrasound consistent with cirrhosis with portal hypertension, low-density structure in the left lobe of the liver highlighted on prior noncontrast CTs up oropharynx. -LFTs starting to worsen again as trend shows: Recent Labs  Lab 03/19/22 0403 03/20/22 0445 03/21/22 0405  03/22/22 0518 03/23/22 1028 03/24/22 0425 03/25/22 1019  AST 87* 95* 101* 173* 162* 135* 147*  ALT '27 30 30 '$ 38 41 37 41  -Outpatient follow-up.   Hypokalemia -K+ Trend: Recent Labs  Lab 03/19/22 0403 03/20/22 0445 03/21/22 0405 03/22/22 0518 03/23/22 1028 03/24/22 0425 03/25/22 1019  K 3.7 3.7 3.6 3.4* 4.5 3.8 3.8  -Continue to Monitor and Replete as Necessary -Repeat CMP in the AM    Liver Cirrhosis -Outpatient follow-up with primary hepatologist. -Explains elevated T Bili and AST -T. bili has gone from 1.4 -> 1.6 ->  1.7 x2 -> 1.8 -> 1.9 x2 -She had a little bit of fluid in her legs so we will give her a dose of Lasix again  -Continue to Monitor and Trend   Pancytopenia -Patient with a pancytopenia being followed by hematology/oncology in the outpatient setting. -CBC Trend: Recent Labs  Lab 03/19/22 0403 03/20/22 0445 03/21/22 0405 03/22/22 0518 03/23/22 1028 03/24/22 0425 03/25/22 1019  WBC 3.1* 4.1 4.4 5.6 6.2 5.1 7.3  HGB 8.2* 8.4* 8.1* 8.2* 8.7* 8.3* 9.0*  HCT 25.8* 26.3* 24.7* 25.2* 27.7* 25.9* 27.8*  MCV 102.4* 102.3* 101.6* 102.9* 107.4* 104.9* 104.5*  PLT 78* 92* 90* 109* 106* 95* 121*  -Hgb/Hct is relatively stable and Plt Count and WBC count improving  -Anemia panel to be checked and oncology is ordering multiple myeloma panel; anemia panel showed an iron level of 58, UIBC 146, TIBC of 204, saturation ratios of 28%, ferritin level 303, folate level 11.9, vitamin B12 2501 -Patient with no S/Sx of bleeding. -Hematology/Oncology informed of admission via epic. -Will continue to Monitor and Trend and repeat CBC in the a.m.   Lactic Acidosis -Likely secondary to acute infection of pneumonia and influenza A. -Lactic acid levels trending down and last check was 2.6 -The antibiotics and IV fluid have now stopped -Repeat labs in the AM.   Diabetes Mellitus Type 2 -Hemoglobin A1c 5.0 (11/05/2020). -CBG's ranging from 80-138 -Hemoglobin A1c 4.9.    -Continue to hold oral hypoglycemic agents.  -Discontinued SSI, CBGs.  -Outpatient follow-up.    Hypoalbuminemia -Patient's Albumin Level Trend: Recent Labs  Lab 03/19/22 0403 03/20/22 0445 03/21/22 0405 03/22/22 0518 03/23/22 1028 03/24/22 0425 03/25/22 1019  ALBUMIN 2.0* 2.1* 1.9* 2.2* 2.2* 2.0* 2.3*  -Continue to Monitor and Trend   Hypertension -BP stable and had been somewhat soft. -Continue to hold home regimen antihypertensive medications.   -IV fluids have now stopped and she is getting IV Lasix -Continue to Monitor BP per protocol -Last BP reading was on the softer side at 105/45   Pressure injury, stage I medial buttocks, POA Pressure Injury 03/14/22 Buttocks Medial;Mid Stage 1 -  Intact skin with non-blanchable redness of a localized area usually over a bony prominence. (Active)  03/14/22 2200  Location: Buttocks  Location Orientation: Medial;Mid  Staging: Stage 1 -  Intact skin with non-blanchable redness of a localized area usually over a bony prominence.  Wound Description (Comments):   Present on Admission: Yes   Hyperbilirubinemia -Patient's T Bili went from 2.0 -> 1.5 ->  1.4 x2 -> 1.6 -> 1.7 x2 -> 1.8 -> 1.9 x2 -Continue to Monitor and Trend -Repeat CMP in the AM    Obesity -Complicates overall prognosis and care -Estimated body mass index is 30.58 kg/m as calculated from the following:   Height as of this encounter: '5\' 3"'$  (1.6 m).   Weight as of this encounter: 78.3 kg.  -Weight Loss and Dietary Counseling given  DVT prophylaxis: SCDs Start: 03/14/22 2212 Place and maintain sequential compression device Start: 03/14/22 1706    Code Status: DNR Family Communication: No family currently at bedside  Disposition Plan:  Level of care: Progressive Status is: Inpatient Remains inpatient appropriate because: Needs further workup for her new lytic lesions and clearance by oncology as well as diuresis   Consultants:  Medical oncology General  Surgery/Trauma Team Dr. Bobbye Morton Neurosurgery  Procedures:  CT head CT C-spine 03/14/2022 Chest x-ray 03/14/2022 Renal ultrasound 03/14/2022 Right upper quadrant ultrasound 03/15/2022 CT chest abdomen and pelvis 03/14/2022 Lytic iliac Wing Bone Marrow Bx 03/25/2022  Antimicrobials:  Anti-infectives (From admission, onward)    Start     Dose/Rate Route Frequency Ordered Stop   03/15/22 1600  azithromycin (ZITHROMAX) 500 mg in sodium chloride 0.9 % 250 mL IVPB  Status:  Discontinued        500 mg 250 mL/hr over 60 Minutes Intravenous Every 24 hours 03/14/22 2211 03/17/22 1759   03/15/22 1000  cefTRIAXone (ROCEPHIN) 2 g in sodium chloride 0.9 % 100 mL IVPB        2 g 200 mL/hr over 30 Minutes Intravenous Every 24 hours 03/14/22 2211 03/20/22 1520   03/15/22 1000  oseltamivir (TAMIFLU) capsule 30 mg        30 mg Oral Daily 03/15/22 0910 03/19/22 1200   03/14/22 1530  cefTRIAXone (ROCEPHIN) 1 g in sodium chloride 0.9 % 100 mL IVPB        1 g 200 mL/hr over 30 Minutes Intravenous  Once 03/14/22 1515 03/14/22 1606   03/14/22 1530  azithromycin (ZITHROMAX) 500 mg in sodium chloride 0.9 % 250 mL IVPB        500 mg 250 mL/hr over 60 Minutes Intravenous  Once 03/14/22 1515 03/14/22 1717       Subjective: Seen and examined at bedside and was waiting for her bone marrow sleeve to be done.  She stated that she is asleep and wanting to rest.  No nausea or vomiting.  Denies any lightheadedness or dizziness.  No other concerns or complaints at this time.  Objective: Vitals:   03/25/22 1329 03/25/22 1516 03/25/22 1535 03/25/22 1540  BP: (!) 139/48 (!) 108/54 (!) 95/53 (!) 105/45  Pulse: 86 86 90 90  Resp: '18 16 14 20  '$ Temp: 97.9 F (36.6 C)     TempSrc: Oral     SpO2: 96% 100% 100% 100%  Weight:      Height:        Intake/Output Summary (Last 24 hours) at 03/25/2022 2028 Last data filed at 03/25/2022 1800 Gross per 24 hour  Intake 480 ml  Output --  Net 480 ml   Filed Weights   03/22/22 0439  03/23/22 0500 03/24/22 0454  Weight: 77.7 kg 78 kg 78.3 kg   Examination: Physical Exam:  Constitutional: WN/WD obese Caucasian female currently no acute distress Respiratory: Diminished to auscultation bilaterally, no wheezing, rales, rhonchi or crackles. Normal respiratory effort and patient is not tachypenic. No accessory muscle use.  Unlabored breathing Cardiovascular: RRR, no murmurs / rubs /  gallops. S1 and S2 auscultated.  Has 1+ lower extremity bilateral pitting edema Abdomen: Soft, non-tender, distended secondary to body habitus bowel sounds positive.  GU: Deferred. Musculoskeletal: No clubbing / cyanosis of digits/nails. No joint deformity upper and lower extremities. Skin: No rashes, lesions, ulcers on limited skin evaluation. No induration; Warm and dry.  Neurologic: CN 2-12 grossly intact with no focal deficits. Romberg sign and cerebellar reflexes not assessed.  Psychiatric: Normal judgment and insight. Alert and oriented x 3. Normal mood and appropriate affect.   Data Reviewed: I have personally reviewed following labs and imaging studies  CBC: Recent Labs  Lab 03/21/22 0405 03/22/22 0518 03/23/22 1028 03/24/22 0425 04-18-2022 1019  WBC 4.4 5.6 6.2 5.1 7.3  NEUTROABS 3.1 4.3 5.1 4.1 5.7  HGB 8.1* 8.2* 8.7* 8.3* 9.0*  HCT 24.7* 25.2* 27.7* 25.9* 27.8*  MCV 101.6* 102.9* 107.4* 104.9* 104.5*  PLT 90* 109* 106* 95* 124*   Basic Metabolic Panel: Recent Labs  Lab 03/21/22 0405 03/22/22 0518 03/23/22 1028 03/24/22 0425 2022-04-18 1019  NA 133* 133* 131* 131* 129*  K 3.6 3.4* 4.5 3.8 3.8  CL 108 107 105 104 104  CO2 17* 18* 19* 20* 18*  GLUCOSE 95 90 147* 86 85  BUN 40* 39* 46* 43* 44*  CREATININE 1.77* 1.95* 2.13* 2.07* 2.06*  CALCIUM 8.2* 8.4* 8.2* 8.1* 7.9*  MG 2.0 1.9 2.1 2.0 1.9  PHOS 3.2 3.6 3.4 3.7 4.1   GFR: Estimated Creatinine Clearance: 21.6 mL/min (A) (by C-G formula based on SCr of 2.06 mg/dL (H)). Liver Function Tests: Recent Labs  Lab  03/21/22 0405 03/22/22 0518 03/23/22 1028 03/24/22 0425 18-Apr-2022 1019  AST 101* 173* 162* 135* 147*  ALT 30 38 41 37 41  ALKPHOS 145* 171* 177* 162* 213*  BILITOT 1.7* 1.7* 1.8* 1.9* 1.9*  PROT 4.8* 5.2* 5.2* 4.9* 6.0*  ALBUMIN 1.9* 2.2* 2.2* 2.0* 2.3*   No results for input(s): "LIPASE", "AMYLASE" in the last 168 hours. No results for input(s): "AMMONIA" in the last 168 hours. Coagulation Profile: Recent Labs  Lab 18-Apr-2022 0409  INR 1.4*   Cardiac Enzymes: No results for input(s): "CKTOTAL", "CKMB", "CKMBINDEX", "TROPONINI" in the last 168 hours. BNP (last 3 results) No results for input(s): "PROBNP" in the last 8760 hours. HbA1C: No results for input(s): "HGBA1C" in the last 72 hours. CBG: No results for input(s): "GLUCAP" in the last 168 hours. Lipid Profile: No results for input(s): "CHOL", "HDL", "LDLCALC", "TRIG", "CHOLHDL", "LDLDIRECT" in the last 72 hours. Thyroid Function Tests: No results for input(s): "TSH", "T4TOTAL", "FREET4", "T3FREE", "THYROIDAB" in the last 72 hours. Anemia Panel: Recent Labs    18-Apr-2022 0409  VITAMINB12 2,501*  FOLATE 11.9  FERRITIN 303  TIBC 204*  IRON 58  RETICCTPCT 5.7*   Sepsis Labs: No results for input(s): "PROCALCITON", "LATICACIDVEN" in the last 168 hours.  No results found for this or any previous visit (from the past 240 hour(s)).   Radiology Studies: CT BIOPSY  Result Date: 04/18/2022 INDICATION: 82 year old female with a history iliac bone lesion referred for biopsy EXAM: CT BIOPSY MEDICATIONS: None. ANESTHESIA/SEDATION: Moderate (conscious) sedation was employed during this procedure. A total of Versed 1.0 mg and Fentanyl 50 mcg was administered intravenously. Moderate Sedation Time: 11 minutes. The patient's level of consciousness and vital signs were monitored continuously by radiology nursing throughout the procedure under my direct supervision. FLUOROSCOPY TIME:  CT COMPLICATIONS: None PROCEDURE: Informed written  consent was obtained from the patient after a thorough discussion  of the procedural risks, benefits and alternatives. All questions were addressed. Maximal Sterile Barrier Technique was utilized including caps, mask, sterile gowns, sterile gloves, sterile drape, hand hygiene and skin antiseptic. A timeout was performed prior to the initiation of the procedure. Patient positioned supine on the CT gantry table. Scout CT acquired for planning purposes. The patient is prepped and draped in the usual sterile fashion. 1% lidocaine was used for local anesthesia. Using CT guidance, guide needle was advanced into the lytic lesion of the left iliac crest. Once we confirmed needle tip position, multiple 18 gauge core biopsy were acquired. Specimen placed into formalin. Patient tolerated the procedure well and remained hemodynamically stable throughout. No complications were encountered and no significant blood loss. IMPRESSION: Status post CT-guided biopsy of left iliac crest lytic lesion. Signed, Dulcy Fanny. Nadene Rubins, RPVI [facility] Radiology Electronically Signed   By: Corrie Mckusick D.O.   On: 03/25/2022 16:21   DG CHEST PORT 1 VIEW  Result Date: 03/24/2022 CLINICAL DATA:  Shortness of breath. EXAM: PORTABLE CHEST 1 VIEW COMPARISON:  03/22/2022 FINDINGS: Lungs are adequately inflated without focal airspace consolidation or effusion. Cardiomediastinal silhouette is normal. Surgical clips over the right axilla. Talus device overlies the right upper quadrant. There are multiple displaced left lateral rib fractures as well as displaced right lateral rib fractures unchanged. IMPRESSION: 1. No acute cardiopulmonary disease. 2. Multiple displaced bilateral rib fractures unchanged. Electronically Signed   By: Marin Olp M.D.   On: 03/24/2022 08:19    Scheduled Meds:  acetaminophen  1,000 mg Oral Q6H   calcium carbonate  1,250 mg Oral Q breakfast   cholecalciferol   1,000 Units Oral Daily   cyanocobalamin  1,000 mcg Oral Daily   fluticasone  2 spray Each Nare Daily   folic acid  1 mg Oral Daily   guaiFENesin  1,200 mg Oral BID   hydrocortisone   Rectal TID   [START ON 03/27/2022] letrozole  2.5 mg Oral QODAY   levothyroxine  112 mcg Oral Q0600   loratadine  10 mg Oral Daily   methocarbamol  1,000 mg Oral TID   pantoprazole  40 mg Oral Daily   Continuous Infusions:   LOS: 11 days   Raiford Noble, DO [facility] Available via Epic secure chat 7am-7pm After these hours, please refer to coverage provider listed on [email or web address] 03/25/2022, 8:28 PM

## 2022-03-25 NOTE — Procedures (Signed)
Interventional Radiology Procedure Note  Procedure: CT guided biopsy of left iliac bone lesion.  Complications: None EBL: None Recommendations: - Bedrest 1 hours.  Ambulate per primary order - Routine wound care - Follow up pathology - Advance diet ok per primary order  Signed,  Corrie Mckusick, DO

## 2022-03-25 NOTE — Progress Notes (Signed)
Mobility Specialist - Progress Note   03/25/22 1017  Mobility  Activity Transferred to/from [facility];Ambulated with assistance in hallway  Level of Assistance Minimal assist, patient does 75% or more  Assistive Device Front wheel walker  Distance Ambulated (ft) 100 ft  Range of Motion/Exercises Active  Activity Response Tolerated well  Mobility Referral Yes  $Mobility charge 1 Mobility   Pt was found in bed and requested to use BSC. Was agreeable to ambulate in hallway afterwards. Ambulated 54f took a seated reat break for ~164m. Proceeded to ambulate 5070fnd took another seated rest break for ~1mi84mThen, ambulated back to room and returned to bed. Was left in bed with necessities in reach and RN notified.  BrenFerd Hibbsility Specialist

## 2022-03-25 NOTE — Consult Note (Addendum)
Chief Complaint: Bone lesions. Request is for left iliac crest bone lesion biopsy   Referring Physician(s): Dr. Loma Messing  Supervising Physician: Corrie Mckusick  Patient Status: [facility] - In-pt  History of Present Illness: Catherine Decker is a 82 y.o. female  inpatient. History of hypothyroidism, DM,  cirrhosis, breast cancer s/p mastectomy. Presented to the ED at [facility] on 1.5.24 with URI symptoms and injured related to fall down 4 stairs. Found to have various  skeletal lesions, influenza A, ALK, Team is requesting biopsy of skeletal lesion for further evaluation of metastatic disease. Case reviewed by IR Attending Dr. Rolla Plate who recommends left iliac crest bone biopsy.  Patient alert and laying in bed,calm. Endorses right chest pain.  Denies any fevers, headache, SOB, cough, abdominal pain, nausea, vomiting or bleeding. Return precautions and treatment recommendations and follow-up discussed with the patient  who is agreeable with the plan.    Past Medical History:  Diagnosis Date   Anemia    Arthritis of back    Arthritis of hand    per patient right hand   Autoimmune deficiency syndrome ([place])    per patient   Breast cancer ([place])    Cancer ([place]) 08/2019   right breast ILC   Cirrhosis ([place])    Cirrhosis of liver ([facility])    Complication of anesthesia    per patient, slow to awake   Diabetes mellitus    GERD (gastroesophageal reflux disease)    Also, Hiatal Hernia    H/O migraine    Hepatitis    per patient, had infectious hepatitis as a 82 year old   Hyperlipidemia    Hypertension    Hypothyroidism    Splenic artery aneurysm ([place])     Past Surgical History:  Procedure Laterality Date   APPENDECTOMY     BREAST LUMPECTOMY     Right breast lumpectomy   CATARACT EXTRACTION W/ INTRAOCULAR LENS  IMPLANT, BILATERAL  2018   CHOLECYSTECTOMY  04/2010   MASTECTOMY W/ SENTINEL NODE BIOPSY Right 09/07/2019   Procedure: RIGHT SIMPLE MASTECTOMY WITH SENTINEL LYMPH NODE MAPPING;   Surgeon: Erroll Luna, MD;  Location: [place];  Service: General;  Laterality: Right;  PEC BLOCK   SCAR REVISION Right 06/21/2020   Procedure: SCAR REVISION OF RIGHT MASTECTOMY;  Surgeon: Erroll Luna, MD;  Location: [place];  Service: General;  Laterality: Right;   TONSILLECTOMY     TUBAL LIGATION  1975    Allergies: Demerol, Meperidine, and Meperidine hcl  Medications: Prior to Admission medications   Medication Sig Start Date End Date Taking? Authorizing Provider  calcium carbonate (OS-CAL) 600 MG TABS tablet Take 600 mg by mouth daily with breakfast.   Yes [provider]  celecoxib (CELEBREX) 200 MG capsule Take 200 mg by mouth 2 (two) times daily.   Yes [provider]  cholecalciferol (VITAMIN D3) 25 MCG (1000 UNIT) tablet Take 1,000 Units by mouth daily.   Yes [provider]  Coenzyme Q10 (COQ10) 100 MG CAPS Take 1 capsule by mouth daily.   Yes [provider]  fenofibrate 160 MG tablet Take 160 mg by mouth daily.   Yes [provider]  folic acid (FOLVITE) 1 MG tablet Take 1 tablet (1 mg total) by mouth daily. 12/12/19  Yes Orson Slick, MD  Ginkgo Biloba 60 MG CAPS Take 60 mg by mouth daily.   Yes [provider]  letrozole (FEMARA) 2.5 MG tablet Take 1 tablet (2.5 mg  total) by mouth daily. 01/24/22  Yes Orson Slick, MD  levothyroxine (SYNTHROID) 112 MCG tablet Take 112 mcg by mouth daily before breakfast.   Yes [provider]  losartan (COZAAR) 25 MG tablet Take 12.5 mg by mouth daily.  04/25/19  Yes [provider]  Omega-3 Fatty Acids (FISH OIL) 600 MG CAPS Take 600 mg by mouth daily.   Yes [provider]  omeprazole (PRILOSEC) 20 MG capsule Take 20 mg by mouth daily as needed (acid reflux).    Yes [provider]  pioglitazone (ACTOS) 15 MG tablet Take 15 mg by mouth daily. 04/12/19  Yes [provider]  simvastatin (ZOCOR) 20 MG tablet Take 20 mg  by mouth every evening.   Yes [provider]  vitamin B-12 (CYANOCOBALAMIN) 1000 MCG tablet Take 1,000 mcg by mouth daily.   Yes [provider]  Glucose Blood (ONETOUCH ULTRA BLUE VI) daily.    [provider]     Family History  Problem Relation Age of Onset   Diabetes Mother    Heart disease Mother    Hypertension Mother    Breast cancer Mother    Heart disease Father    Heart attack Father    Prostate cancer Father    Hypertension Sister    Varicose Veins Sister    Hyperlipidemia Sister    Colon cancer Sister    Heart disease Brother    Diabetes Brother    Hypertension Brother    Prostate cancer Brother    Cirrhosis Brother    Hypertension Brother    Hypertension Daughter    Hypertension Son    Liver cancer Neg Hx    Osteoporosis Neg Hx     Social History   Socioeconomic History   Marital status: Widowed    Spouse name: Not on file   Number of children: 3   Years of education: Not on file   Highest education level: Not on file  Occupational History   Occupation: retired   Occupation: retired    Comment: CNA  Tobacco Use   Smoking status: Never   Smokeless tobacco: Never  Vaping Use   Vaping Use: Never used  Substance and Sexual Activity   Alcohol use: No   Drug use: No   Sexual activity: Not on file  Other Topics Concern   Not on file  Social History Narrative   Lives with a friend.    Right handed    Lives in a two story home       Social Determinants of Health   Financial Resource Strain: Not on file  Food Insecurity: No Food Insecurity (03/14/2022)   Hunger Vital Sign    Worried About Running Out of Food in the Last Year: Never true    Ran Out of Food in the Last Year: Never true  Transportation Needs: No Transportation Needs (03/14/2022)   PRAPARE - Hydrologist (Medical): No    Lack of Transportation (Non-Medical): No  Physical Activity: Not on file  Stress: Not on file  Social  Connections: Not on file     Review of Systems: A 12 point ROS discussed and pertinent positives are indicated in the HPI above.  All other systems are negative.  Review of Systems  Constitutional:  Negative for fatigue and fever.  HENT:  Negative for congestion.   Respiratory:  Negative for cough and shortness of breath.   Cardiovascular:  Positive for chest pain (  right sided).  Gastrointestinal:  Negative for abdominal pain, diarrhea, nausea and vomiting.    Vital Signs: BP 122/62 (BP Location: Left Wrist)   Pulse 85   Temp 98.7 F (37.1 C) (Oral)   Resp 19   Ht '5\' 3"'$  (1.6 m)   Wt 172 lb 9.9 oz (78.3 kg)   SpO2 97%   BMI 30.58 kg/m     Physical Exam Vitals and nursing note reviewed.  Constitutional:      Appearance: She is well-developed.  HENT:     Head: Normocephalic and atraumatic.  Eyes:     Conjunctiva/sclera: Conjunctivae normal.  Cardiovascular:     Rate and Rhythm: Normal rate and regular rhythm.     Heart sounds: Normal heart sounds.  Pulmonary:     Effort: Pulmonary effort is normal.     Breath sounds: Normal breath sounds.  Musculoskeletal:        General: Normal range of motion.     Cervical back: Normal range of motion.  Skin:    General: Skin is warm.  Neurological:     Mental Status: She is alert and oriented to person, place, and time.     Imaging: DG CHEST PORT 1 VIEW  Result Date: 03/24/2022 CLINICAL DATA:  Shortness of breath. EXAM: PORTABLE CHEST 1 VIEW COMPARISON:  03/22/2022 FINDINGS: Lungs are adequately inflated without focal airspace consolidation or effusion. Cardiomediastinal silhouette is normal. Surgical clips over the right axilla. Talus device overlies the right upper quadrant. There are multiple displaced left lateral rib fractures as well as displaced right lateral rib fractures unchanged. IMPRESSION: 1. No acute cardiopulmonary disease. 2. Multiple displaced bilateral rib fractures unchanged. Electronically Signed   By: Marin Olp M.D.   On: 03/24/2022 08:19   DG CHEST PORT 1 VIEW  Result Date: 03/22/2022 CLINICAL DATA:  Shortness of breath, cough. EXAM: PORTABLE CHEST 1 VIEW COMPARISON:  Chest x-rays dated 03/21/2022 and 03/20/2022 chest CT dated 03/14/2022. FINDINGS: Persistent airspace opacities at the bilateral lung bases, atelectasis and/or small pleural effusions, but improved aeration at the lung bases compared to the recent chest x-rays. No new lung findings. No pneumothorax is seen. Heart size and mediastinal contours appear stable. IMPRESSION: Improved aeration at the bilateral lung bases. Persistent airspace opacities at the lung bases, likely decreased atelectasis and/or small pleural effusions. Electronically Signed   By: Franki Cabot M.D.   On: 03/22/2022 09:40   ECHOCARDIOGRAM COMPLETE  Result Date: 03/22/2022    ECHOCARDIOGRAM REPORT   Patient Name:   Catherine Decker Date of Exam: 03/22/2022 Medical Rec #:  [id number]       Height:       63.0 in Accession #:    [id number]      Weight:       171.3 lb Date of Birth:  11/20/1940      BSA:          1.810 m Patient Age:    61 years        BP:           135/58 mmHg Patient Gender: F               HR:           66 bpm. Exam Location:  Inpatient Procedure: 2D Echo, Cardiac Doppler and Color Doppler Indications:    Bacteremia  History:        Patient has no prior history of Echocardiogram examinations.  Risk Factors:Diabetes, Hypertension and Dyslipidemia. FLU                 positive. Breast cancer 2021. SDH.  Sonographer:    Roseanna Rainbow RDCS Referring Phys: [id number] [facility] LATIF [facility]  Sonographer Comments: Suboptimal parasternal window and suboptimal subcostal window. Could not turn due to multiple rib fractures. IMPRESSIONS  1. Left ventricular ejection fraction, by estimation, is 60 to 65%. The left ventricle has normal function. The left ventricle has no regional wall motion abnormalities. There is mild left ventricular hypertrophy. Left ventricular  diastolic parameters are consistent with Grade I diastolic dysfunction (impaired relaxation). Elevated left ventricular end-diastolic pressure.  2. Right ventricular systolic function is normal. The right ventricular size is normal.  3. The mitral valve is abnormal. No evidence of mitral valve regurgitation. No evidence of mitral stenosis.  4. The aortic valve is tricuspid. Aortic valve regurgitation is not visualized. No aortic stenosis is present.  5. The inferior vena cava is normal in size with greater than 50% respiratory variability, suggesting right atrial pressure of 3 mmHg. FINDINGS  Left Ventricle: Left ventricular ejection fraction, by estimation, is 60 to 65%. The left ventricle has normal function. The left ventricle has no regional wall motion abnormalities. The left ventricular internal cavity size was normal in size. There is  mild left ventricular hypertrophy. Left ventricular diastolic parameters are consistent with Grade I diastolic dysfunction (impaired relaxation). Elevated left ventricular end-diastolic pressure. Right Ventricle: The right ventricular size is normal. No increase in right ventricular wall thickness. Right ventricular systolic function is normal. Left Atrium: Left atrial size was normal in size. Right Atrium: Right atrial size was normal in size. Pericardium: There is no evidence of pericardial effusion. Mitral Valve: The mitral valve is abnormal. There is mild thickening of the mitral valve leaflet(s). Mild mitral annular calcification. No evidence of mitral valve regurgitation. No evidence of mitral valve stenosis. Tricuspid Valve: The tricuspid valve is normal in structure. Tricuspid valve regurgitation is mild . No evidence of tricuspid stenosis. Aortic Valve: The aortic valve is tricuspid. Aortic valve regurgitation is not visualized. No aortic stenosis is present. Pulmonic Valve: The pulmonic valve was normal in structure. Pulmonic valve regurgitation is mild. No evidence  of pulmonic stenosis. Aorta: The aortic root is normal in size and structure. Venous: The inferior vena cava is normal in size with greater than 50% respiratory variability, suggesting right atrial pressure of 3 mmHg. IAS/Shunts: No atrial level shunt detected by color flow Doppler.  LEFT VENTRICLE PLAX 2D LVIDd:         4.30 cm   Diastology LVIDs:         2.25 cm   LV e' medial:    6.31 cm/s LV PW:         1.10 cm   LV E/e' medial:  14.5 LV IVS:        1.30 cm   LV e' lateral:   6.31 cm/s LVOT diam:     2.30 cm   LV E/e' lateral: 14.5 LV SV:         125 LV SV Index:   69 LVOT Area:     4.15 cm  RIGHT VENTRICLE             IVC RV S prime:     12.20 cm/s  IVC diam: 1.50 cm TAPSE (M-mode): 2.2 cm LEFT ATRIUM             Index  RIGHT ATRIUM           Index LA diam:        3.40 cm 1.88 cm/m   RA Area:     11.10 cm LA Vol (A2C):   29.9 ml 16.52 ml/m  RA Volume:   19.60 ml  10.83 ml/m LA Vol (A4C):   24.2 ml 13.37 ml/m LA Biplane Vol: 26.2 ml 14.47 ml/m  AORTIC VALVE LVOT Vmax:   145.00 cm/s LVOT Vmean:  104.000 cm/s LVOT VTI:    0.302 m  AORTA Ao Root diam: 3.70 cm Ao Asc diam:  3.30 cm MITRAL VALVE MV Area (PHT): 3.42 cm    SHUNTS MV Decel Time: 222 msec    Systemic VTI:  0.30 m MV E velocity: 91.80 cm/s  Systemic Diam: 2.30 cm MV A velocity: 95.50 cm/s MV E/A ratio:  0.96 Jenkins Rouge MD Electronically signed by Jenkins Rouge MD Signature Date/Time: 03/22/2022/9:30:38 AM    Final    DG CHEST PORT 1 VIEW  Result Date: 03/21/2022 CLINICAL DATA:  Shortness of breath EXAM: PORTABLE CHEST 1 VIEW COMPARISON:  Chest radiograph dated 03/20/2022 FINDINGS: Low lung volumes with similar bibasilar and right lower linear opacities. Similar small bilateral pleural effusions. No pneumothorax. Similar cardiomediastinal silhouette. The visualized skeletal structures are unremarkable. IMPRESSION: 1. Similar small bilateral pleural effusions. 2. Low lung volumes with similar bibasilar and right lower linear opacities,  likely atelectasis. Electronically Signed   By: Darrin Nipper M.D.   On: 03/21/2022 08:36   DG CHEST PORT 1 VIEW  Result Date: 03/20/2022 CLINICAL DATA:  [id number] with shortness of breath. EXAM: PORTABLE CHEST 1 VIEW COMPARISON:  Portable chest and chest CT without contrast both 03/14/2022 FINDINGS: 4:57 a.m. The heart has mildly enlarged in the interval. There is now mild perihilar vascular congestion and lower zonal interstitial edema consistent with CHF or fluid overload. Small pleural effusions have also increased in size, with increasing overlying linear and hazy opacities consistent with atelectasis or pneumonia versus ground-glass edema. The mid and upper lung fields are clear of focal opacities. There are right axillary surgical clips. The mediastinum is normally outlined. There is calcification of the transverse aorta. Degenerative change thoracic spine. IMPRESSION: 1. Mild cardiomegaly with perihilar vascular congestion and lower zonal interstitial edema consistent with CHF or fluid overload. 2. Small pleural effusions have increased in size, with increasing overlying linear and hazy opacities consistent with atelectasis or pneumonia versus ground-glass edema. 3. Clinical correlation and radiographic follow-up recommended. Electronically Signed   By: Telford Nab M.D.   On: 03/20/2022 07:47   US Abdomen Limited RUQ (LIVER/GB)  Result Date: 03/15/2022 CLINICAL DATA:  Elevated liver function tests EXAM: ULTRASOUND ABDOMEN LIMITED RIGHT UPPER QUADRANT COMPARISON:  Abdominal CT from yesterday FINDINGS: Gallbladder: History of cholecystectomy Common bile duct: Diameter: 5 mm Liver: Heterogeneous parenchyma with lobulation from known cirrhosis. There is history of breast cancer but constellation of findings suggest long term cirrhosis rather than pseudocirrhosis. The low-density area highlighted on prior study is an aneurysmal vessel there is contiguous with the portal system on clips, up to 3.6 cm. No gross  solid mass lesion. No portal venous occlusion or reversal noted. IMPRESSION: Cirrhosis with portal hypertension. The low-density structure in the left lobe liver highlighted on prior noncontrast CT is a portal varix. Electronically Signed   By: Jorje Guild M.D.   On: 03/15/2022 08:00   US RENAL  Result Date: 03/14/2022 CLINICAL DATA:  AKI EXAM: RENAL / URINARY TRACT ULTRASOUND  COMPLETE COMPARISON:  CT chest abdomen and pelvis March 14, 2022 FINDINGS: Right Kidney: Renal measurements: 9.3 x 4.8 x 4.3 cm = volume: 98.8 mL. Renal cortical thinning. Echogenicity within normal limits. No mass or hydronephrosis visualized. Left Kidney: Renal measurements: 9.1 x 4.0 x 3.8 cm = volume: 73.1 mL. Renal cortical thinning. Echogenicity within normal limits. No mass or hydronephrosis visualized. Bladder: Appears normal for degree of bladder distention. Other: None. IMPRESSION: 1. Bilateral renal cortical thinning, which can be seen in the setting of medical renal disease. 2. No hydronephrosis. Electronically Signed   By: Beryle Flock M.D.   On: 03/14/2022 18:35   CT CHEST ABDOMEN PELVIS WO CONTRAST  Result Date: 03/14/2022 CLINICAL DATA:  Fall yesterday. Rib pain. Prior mastectomy for right breast cancer. Known cirrhosis. * Tracking Code: BO * EXAM: CT CHEST, ABDOMEN AND PELVIS WITHOUT CONTRAST TECHNIQUE: Multidetector CT imaging of the chest, abdomen and pelvis was performed following the standard protocol without IV contrast. RADIATION DOSE REDUCTION: This exam was performed according to the departmental dose-optimization program which includes automated exposure control, adjustment of the mA and/or kV according to patient size and/or use of iterative reconstruction technique. COMPARISON:  02/28/2020 FINDINGS: CT CHEST FINDINGS Cardiovascular: Mild degradation secondary to arm position, not raised above the head. Lack of IV contrast. Aortic atherosclerosis. Tortuous thoracic aorta. Normal heart size, without  pericardial effusion. Lad coronary artery calcification. Mediastinum/Nodes: Right axillary node dissection. No axillary or subpectoral adenopathy. Calcified mediastinal and right hilar nodes are likely related to old granulomatous disease. No mediastinal or hilar adenopathy, given limitations of unenhanced CT. Lungs/Pleura: No pleural fluid.  Mild left base scarring. Inferior right middle lobe and dependent right lower lobe areas of consolidation. Peribronchovascular nodularity within the more central right middle and right lower lobes. Example 9 mm in the right lower lobe on 81/5. These areas are all new since 02/28/2020. No pneumothorax. Musculoskeletal: Right mastectomy. Osteopenia. Minimally displaced fourth and fifth lateral left rib fractures including on 17 and 21 of series 3 are acute. The seventh lateral right rib fracture is new in the interval but partially healed. An eighth posterolateral right rib fracture is displaced, of indeterminate acuity but unhealed on 39/3. Mild superior endplate compression deformity at T4 is new in the interval. A moderate T7 compression deformity is without ventral canal encroachment and demonstrates heterogeneous increased density within. Heterogeneous increased density within the right acromion on 05/03. CT ABDOMEN PELVIS FINDINGS Hepatobiliary: Advanced cirrhosis. Left hepatic lobe hypoattenuation centrally could represent an enlarged left portal vein when correlated with prior contrast enhanced exam but is indeterminate on 55/3. Cholecystectomy, without biliary ductal dilatation. Pancreas: Normal, without mass or ductal dilatation. Spleen: Splenomegaly at 13.9 cm craniocaudal. Old granulomatous disease within. Adrenals/Urinary Tract: Normal adrenal glands. Mild renal cortical thinning bilaterally. No hydronephrosis. Normal urinary bladder. Stomach/Bowel: Proximal gastric underdistention. Extensive colonic diverticulosis. Sigmoid wall thickening is typical of muscular  hypertrophy. Normal terminal ileum and appendix. Normal small bowel. Vascular/Lymphatic: Advanced aortic and branch vessel atherosclerosis. Splenic artery aneurysm of 1.2 cm is not significantly changed. Portal venous hypertension, with a recanalized paraumbilical vein and abdominal wall collaterals. No abdominopelvic adenopathy. Reproductive: Hysterectomy.  No adnexal mass. Other: Small volume abdominopelvic fluid, new. No free intraperitoneal air. Musculoskeletal: Osteopenia. Left iliac lytic lesion of 2.6 cm on 88/3, new. A mild superior endplate compression deformity at L3 is of indeterminate acuity. IMPRESSION: 1. Low sensitivity exam secondary to lack of oral or IV contrast and patient arm position, not raised above the head.  2. Osseous metastasis, most apparent as a lytic lesion in the left iliac wing. Suspicion of right acromion process metastasis as well. 3. Bilateral rib fractures, acute on the left and of indeterminate acuity on the right. 4. Thoracolumbar compression deformities, new since 02/27/2021,. Of indeterminate acuity. Cannot exclude pathologic fracture, including at T7. Consider nonemergent pre and postcontrast thoracic and possibly lumbar spine MRI. 5. Right lower and right middle lobe consolidation, favoring pneumonia. Concurrent peribronchovascular nodularity which is also most likely infectious. Somewhat atypical appearance of pulmonary metastasis or primary bronchogenic carcinoma felt less likely. Recommend follow-up on chest CT at 6-8 weeks. 6. Cirrhosis and portal venous hypertension with new small volume abdominopelvic ascites. Hypoattenuation in the central left lower lobe could represent an enlarged left portal vein but is indeterminate on this noncontrast exam. This could be further evaluated with nonemergent pre and post contrast abdominal MRI. Alternatively, if the patient is eventually scheduled for PET, this would likely be informative. 7. Similar 12 mm splenic artery aneurysm. 8.  Incidental findings, including: Coronary artery atherosclerosis. Aortic Atherosclerosis (ICD10-I70.0). Electronically Signed   By: Abigail Miyamoto M.D.   On: 03/14/2022 14:49   CT Head Wo Contrast  Result Date: 03/14/2022 CLINICAL DATA:  Status post fall EXAM: CT HEAD WITHOUT CONTRAST CT CERVICAL SPINE WITHOUT CONTRAST TECHNIQUE: Multidetector CT imaging of the head and cervical spine was performed following the standard protocol without intravenous contrast. Multiplanar CT image reconstructions of the cervical spine were also generated. RADIATION DOSE REDUCTION: This exam was performed according to the departmental dose-optimization program which includes automated exposure control, adjustment of the mA and/or kV according to patient size and/or use of iterative reconstruction technique. COMPARISON:  None Available. FINDINGS: CT HEAD FINDINGS Brain: Bilateral low-attenuation sub dural fluid collections overlie the cerebral image spheres compatible with chronic subdural hygromas. Overlying the right frontoparietal convexity there is a 4 mm intermediate density subpleural fluid subdural fluid collection which measures Hounsfield units between 50 and 60, image 28/6 and image 26/5. No definite hyperdense subdural fluid collections identified. There is no significant midline shift or mass effect. No signs of acute brain infarct. There is mild diffuse low-attenuation within the subcortical and periventricular white matter compatible with chronic microvascular disease. Vascular: No hyperdense vessel or unexpected calcification. Skull: Normal. Negative for fracture or focal lesion. Sinuses/Orbits: No acute finding. Other: None CT CERVICAL SPINE FINDINGS Alignment: Normal. Skull base and vertebrae: There is an acute mildly displaced fracture involving the posterior spinous process T1, image 71/5. Lucent lesion is identified within the posterior aspect of the T1 vertebral body measuring 1.4 cm, image 73/5. Soft tissues and  spinal canal: No prevertebral fluid or swelling. No visible canal hematoma. Disc levels: Multilevel disc space narrowing and endplate spurring noted within the cervical spine. Upper chest: No pneumothorax identified within the lung apices. Permeative appearance of the right coracoid process of the right scapula, image 79/5. Other: None IMPRESSION: 1. Bilateral low-attenuation sub dural fluid collections overlie the cerebral image spheres compatible with chronic subdural hygromas. 2. Overlying the right frontoparietal convexity there is a 4 mm intermediate density subpleural fluid collection which measures Hounsfield units between 50 and 60. This is favored to represent a subacute subdural hematoma. 3. Acute mildly displaced fracture involving the posterior spinous process of T1. 4. Lucent lesion is identified within the posterior aspect of the T1 vertebral body measuring 1.4 cm. Permeative appearance of the right coracoid process of the right scapula. Findings are concerning for metastatic disease. 5. Chronic small vessel ischemic  disease and brain atrophy. Critical Value/emergent results were called by telephone at the time of interpretation on 03/14/2022 at 2:43 pm to provider [facility] , who verbally acknowledged these results. Electronically Signed   By: Kerby Moors M.D.   On: 03/14/2022 14:44   CT Cervical Spine Wo Contrast  Result Date: 03/14/2022 CLINICAL DATA:  Status post fall EXAM: CT HEAD WITHOUT CONTRAST CT CERVICAL SPINE WITHOUT CONTRAST TECHNIQUE: Multidetector CT imaging of the head and cervical spine was performed following the standard protocol without intravenous contrast. Multiplanar CT image reconstructions of the cervical spine were also generated. RADIATION DOSE REDUCTION: This exam was performed according to the departmental dose-optimization program which includes automated exposure control, adjustment of the mA and/or kV according to patient size and/or use of iterative  reconstruction technique. COMPARISON:  None Available. FINDINGS: CT HEAD FINDINGS Brain: Bilateral low-attenuation sub dural fluid collections overlie the cerebral image spheres compatible with chronic subdural hygromas. Overlying the right frontoparietal convexity there is a 4 mm intermediate density subpleural fluid subdural fluid collection which measures Hounsfield units between 50 and 60, image 28/6 and image 26/5. No definite hyperdense subdural fluid collections identified. There is no significant midline shift or mass effect. No signs of acute brain infarct. There is mild diffuse low-attenuation within the subcortical and periventricular white matter compatible with chronic microvascular disease. Vascular: No hyperdense vessel or unexpected calcification. Skull: Normal. Negative for fracture or focal lesion. Sinuses/Orbits: No acute finding. Other: None CT CERVICAL SPINE FINDINGS Alignment: Normal. Skull base and vertebrae: There is an acute mildly displaced fracture involving the posterior spinous process T1, image 71/5. Lucent lesion is identified within the posterior aspect of the T1 vertebral body measuring 1.4 cm, image 73/5. Soft tissues and spinal canal: No prevertebral fluid or swelling. No visible canal hematoma. Disc levels: Multilevel disc space narrowing and endplate spurring noted within the cervical spine. Upper chest: No pneumothorax identified within the lung apices. Permeative appearance of the right coracoid process of the right scapula, image 79/5. Other: None IMPRESSION: 1. Bilateral low-attenuation sub dural fluid collections overlie the cerebral image spheres compatible with chronic subdural hygromas. 2. Overlying the right frontoparietal convexity there is a 4 mm intermediate density subpleural fluid collection which measures Hounsfield units between 50 and 60. This is favored to represent a subacute subdural hematoma. 3. Acute mildly displaced fracture involving the posterior spinous  process of T1. 4. Lucent lesion is identified within the posterior aspect of the T1 vertebral body measuring 1.4 cm. Permeative appearance of the right coracoid process of the right scapula. Findings are concerning for metastatic disease. 5. Chronic small vessel ischemic disease and brain atrophy. Critical Value/emergent results were called by telephone at the time of interpretation on 03/14/2022 at 2:43 pm to provider [facility] , who verbally acknowledged these results. Electronically Signed   By: Kerby Moors M.D.   On: 03/14/2022 14:44   DG Chest 2 View  Result Date: 03/14/2022 CLINICAL DATA:  Fall, rib pain, cough, and shortness of breath. EXAM: CHEST - 2 VIEW COMPARISON:  Chest radiographs 08/07/2009 and CT 02/28/2020 FINDINGS: The cardiomediastinal silhouette is unchanged with normal heart size. The lungs are hyperinflated. New patchy opacity is present in the medial right lung base. The left lung is clear. There could be trace pleural effusions. No pneumothorax is identified. There is a mildly displaced posterolateral right eighth rib fracture, and a non or minimally displaced seventh rib fracture is also suspected. Surgical clips are noted in the right axilla. There  is a midthoracic vertebral compression fracture with moderate vertebral body height loss which is new from 2021 and of indeterminate age. IMPRESSION: 1. Right seventh and eighth rib fractures. No pneumothorax. 2. New medial right basilar airspace opacity suspicious for pneumonia. Followup PA and lateral chest radiographs are recommended in 3-4 weeks following trial of antibiotic therapy to ensure resolution and exclude underlying malignancy. 3. Possible trace pleural effusions. 4. Age-indeterminate midthoracic vertebral compression fracture. Electronically Signed   By: Logan Bores M.D.   On: 03/14/2022 12:02    Labs:  CBC: Recent Labs    03/21/22 0405 03/22/22 0518 03/23/22 1028 03/24/22 0425  WBC 4.4 5.6 6.2 5.1  HGB  8.1* 8.2* 8.7* 8.3*  HCT 24.7* 25.2* 27.7* 25.9*  PLT 90* 109* 106* 95*    COAGS: Recent Labs    03/25/22 0409  INR 1.4*    BMP: Recent Labs    03/21/22 0405 03/22/22 0518 03/23/22 1028 03/24/22 0425  NA 133* 133* 131* 131*  K 3.6 3.4* 4.5 3.8  CL 108 107 105 104  CO2 17* 18* 19* 20*  GLUCOSE 95 90 147* 86  BUN 40* 39* 46* 43*  CALCIUM 8.2* 8.4* 8.2* 8.1*  CREATININE 1.77* 1.95* 2.13* 2.07*  GFRNONAA 29* 26* 23* 24*    LIVER FUNCTION TESTS: Recent Labs    03/21/22 0405 03/22/22 0518 03/23/22 1028 03/24/22 0425  BILITOT 1.7* 1.7* 1.8* 1.9*  AST 101* 173* 162* 135*  ALT 30 38 41 37  ALKPHOS 145* 171* 177* 162*  PROT 4.8* 5.2* 5.2* 4.9*  ALBUMIN 1.9* 2.2* 2.2* 2.0*     Assessment and Plan:  82 y.o. female inpatient. History of hypothyroidism, DM,  cirrhosis, breast cancer s/p mastectomy. Presented to the ED at [facility] on 1.5.24 with URI symptoms and injured related to fall down 4 stairs. Found to have various  skeletal lesions, influenza A, ALK, Team is requesting biopsy of skeletal lesion for further evaluation of metastatic disease. Case reviewed by IR Attending Dr. Rolla Plate who recommends left iliac crest bone biopsy.  CT cervical spine and CT CAP from 1.5.23. BUN 43, Cr 2.07alkaline phosphatase 162, AST 135, Total bilirubin 1.9. All other labs and medications are within acceptable parameters. Allergies include demerol. Patient has been NPO since midnight.  Risks and benefits of left iliac bone marrow biopsy was discussed with the patient and/or patient's family including, but not limited to bleeding, infection, damage to adjacent structures or low yield requiring additional tests.  All of the questions were answered and there is agreement to proceed.  Consent signed and in chart.   Thank you for this interesting consult.  I greatly enjoyed meeting MEGHAN WARSHAWSKY and look forward to participating in their care.  A copy of this report was sent to the requesting  provider on this date.  Electronically Signed: Jacqualine Mau, NP 03/25/2022, 8:10 AM   I spent a total of 40 Minutes    in face to face in clinical consultation, greater than 50% of which was counseling/coordinating care for left iliac crest bone lesion

## 2022-03-26 ENCOUNTER — Inpatient Hospital Stay (HOSPITAL_COMMUNITY): Payer: Medicare HMO

## 2022-03-26 DIAGNOSIS — R609 Edema, unspecified: Secondary | ICD-10-CM

## 2022-03-26 DIAGNOSIS — J09X1 Influenza due to identified novel influenza A virus with pneumonia: Secondary | ICD-10-CM | POA: Diagnosis not present

## 2022-03-26 DIAGNOSIS — J181 Lobar pneumonia, unspecified organism: Secondary | ICD-10-CM | POA: Diagnosis not present

## 2022-03-26 DIAGNOSIS — R627 Adult failure to thrive: Secondary | ICD-10-CM

## 2022-03-26 DIAGNOSIS — I5033 Acute on chronic diastolic (congestive) heart failure: Secondary | ICD-10-CM

## 2022-03-26 LAB — KAPPA/LAMBDA LIGHT CHAINS
Kappa free light chain: 116.7 mg/L — ABNORMAL HIGH (ref 3.3–19.4)
Kappa, lambda light chain ratio: 1.21 (ref 0.26–1.65)
Lambda free light chains: 96.2 mg/L — ABNORMAL HIGH (ref 5.7–26.3)

## 2022-03-26 LAB — CBC WITH DIFFERENTIAL/PLATELET
Abs Immature Granulocytes: 0.09 10*3/uL — ABNORMAL HIGH (ref 0.00–0.07)
Basophils Absolute: 0 10*3/uL (ref 0.0–0.1)
Basophils Relative: 1 %
Eosinophils Absolute: 0.1 10*3/uL (ref 0.0–0.5)
Eosinophils Relative: 2 %
HCT: 26.1 % — ABNORMAL LOW (ref 36.0–46.0)
Hemoglobin: 8.4 g/dL — ABNORMAL LOW (ref 12.0–15.0)
Immature Granulocytes: 2 %
Lymphocytes Relative: 11 %
Lymphs Abs: 0.6 10*3/uL — ABNORMAL LOW (ref 0.7–4.0)
MCH: 33.2 pg (ref 26.0–34.0)
MCHC: 32.2 g/dL (ref 30.0–36.0)
MCV: 103.2 fL — ABNORMAL HIGH (ref 80.0–100.0)
Monocytes Absolute: 0.3 10*3/uL (ref 0.1–1.0)
Monocytes Relative: 6 %
Neutro Abs: 4.6 10*3/uL (ref 1.7–7.7)
Neutrophils Relative %: 78 %
Platelets: 101 10*3/uL — ABNORMAL LOW (ref 150–400)
RBC: 2.53 MIL/uL — ABNORMAL LOW (ref 3.87–5.11)
RDW: 20.1 % — ABNORMAL HIGH (ref 11.5–15.5)
WBC: 5.7 10*3/uL (ref 4.0–10.5)
nRBC: 0 % (ref 0.0–0.2)

## 2022-03-26 LAB — COMPREHENSIVE METABOLIC PANEL
ALT: 39 U/L (ref 0–44)
AST: 156 U/L — ABNORMAL HIGH (ref 15–41)
Albumin: 2.1 g/dL — ABNORMAL LOW (ref 3.5–5.0)
Alkaline Phosphatase: 187 U/L — ABNORMAL HIGH (ref 38–126)
Anion gap: 8 (ref 5–15)
BUN: 45 mg/dL — ABNORMAL HIGH (ref 8–23)
CO2: 19 mmol/L — ABNORMAL LOW (ref 22–32)
Calcium: 8 mg/dL — ABNORMAL LOW (ref 8.9–10.3)
Chloride: 105 mmol/L (ref 98–111)
Creatinine, Ser: 2.03 mg/dL — ABNORMAL HIGH (ref 0.44–1.00)
GFR, Estimated: 24 mL/min — ABNORMAL LOW (ref >60)
Glucose, Bld: 87 mg/dL (ref 70–99)
Potassium: 3.6 mmol/L (ref 3.5–5.1)
Sodium: 132 mmol/L — ABNORMAL LOW (ref 135–145)
Total Bilirubin: 1.8 mg/dL — ABNORMAL HIGH (ref 0.3–1.2)
Total Protein: 5.1 g/dL — ABNORMAL LOW (ref 6.5–8.1)

## 2022-03-26 LAB — MAGNESIUM: Magnesium: 1.9 mg/dL (ref 1.7–2.4)

## 2022-03-26 LAB — PHOSPHORUS: Phosphorus: 4 mg/dL (ref 2.5–4.6)

## 2022-03-26 MED ORDER — ALBUMIN HUMAN 25 % IV SOLN
12.5000 g | Freq: Every day | INTRAVENOUS | Status: DC
  Administered 2022-03-26 – 2022-03-29 (×4): 12.5 g via INTRAVENOUS
  Filled 2022-03-26 (×4): qty 50

## 2022-03-26 MED ORDER — GLUCERNA SHAKE PO LIQD
237.0000 mL | Freq: Three times a day (TID) | ORAL | Status: DC
  Administered 2022-03-26 – 2022-04-03 (×22): 237 mL via ORAL
  Filled 2022-03-26 (×26): qty 237

## 2022-03-26 MED ORDER — FUROSEMIDE 10 MG/ML IJ SOLN
40.0000 mg | Freq: Every day | INTRAMUSCULAR | Status: DC
  Administered 2022-03-26 – 2022-03-27 (×2): 40 mg via INTRAVENOUS
  Filled 2022-03-26 (×2): qty 4

## 2022-03-26 NOTE — Plan of Care (Signed)
  Problem: Coping: Goal: Ability to adjust to condition or change in health will improve Outcome: Progressing   Problem: Fluid Volume: Goal: Ability to maintain a balanced intake and output will improve Outcome: Progressing   Problem: Metabolic: Goal: Ability to maintain appropriate glucose levels will improve Outcome: Progressing   Problem: Activity: Goal: Ability to tolerate increased activity will improve Outcome: Progressing

## 2022-03-26 NOTE — Progress Notes (Signed)
Occupational Therapy Treatment Patient Details Name: Catherine Decker MRN: [id number] DOB: Dec 05, 1940 Today's Date: 03/26/2022   History of present illness Patient is a 82 year old female who presented after a fall down some stairs. Patient was found to have left rib fractures 4-5, right rib fractures 7-8, T7 compression fx, T1 spinous process fx, SDH.  Pt also found to have influenza A. EGB:TDVVO breast cancer, arthritis, hyperlipidemia, splenic artery aneurysm   OT comments  Patient made progress towards short term goals.  Patient continues to require cues to maintain back precautions. Patient was motivated to participate in session. Patient's discharge plan remains appropriate at this time. OT will continue to follow acutely.     Recommendations for follow up therapy are one component of a multi-disciplinary discharge planning process, led by the attending physician.  Recommendations may be updated based on patient status, additional functional criteria and insurance authorization.    Follow Up Recommendations  Skilled nursing-short term rehab (<3 hours/day)     Assistance Recommended at Discharge Frequent or constant Supervision/Assistance  Patient can return home with the following  A little help with walking and/or transfers;A lot of help with bathing/dressing/bathroom;Assistance with cooking/housework;Help with stairs or ramp for entrance   Equipment Recommendations  None recommended by OT    Recommendations for Other Services      Precautions / Restrictions Precautions Precautions: Fall Precaution Comments: rib fractures, back preacutions Restrictions Weight Bearing Restrictions: No       Mobility Bed Mobility Overal bed mobility: Needs Assistance Bed Mobility: Supine to Sit     Supine to sit: Min assist                ADL either performed or assessed with clinical judgement   ADL Overall ADL's : Needs assistance/impaired     Grooming: Wash/dry  hands;Wash/dry face;Sitting   Upper Body Bathing: Set up;Sitting               Toilet Transfer: Min guard;Ambulation;Rolling walker (2 wheels) Toilet Transfer Details (indicate cue type and reason): from edge of bed to recliner in room. Toileting- Clothing Manipulation and Hygiene: Supervision/safety;Sit to/from stand                Cognition Arousal/Alertness: Awake/alert Behavior During Therapy: WFL for tasks assessed/performed Overall Cognitive Status: Within Functional Limits for tasks assessed       General Comments: patient noted to have stronger voice today compared to evaluation. plesant and cooperative                   Pertinent Vitals/ Pain       Pain Assessment Pain Assessment: Faces Faces Pain Scale: Hurts a little bit Pain Location: R ribs with activity Pain Descriptors / Indicators: Discomfort, Grimacing         Frequency  Min 2X/week        Progress Toward Goals  OT Goals(current goals can now be found in the care plan section)  Progress towards OT goals: Progressing toward goals     Plan Discharge plan remains appropriate       AM-PAC OT "6 Clicks" Daily Activity     Outcome Measure   Help from another person eating meals?: None Help from another person taking care of personal grooming?: A Little Help from another person toileting, which includes using toliet, bedpan, or urinal?: A Little Help from another person bathing (including washing, rinsing, drying)?: A Little Help from another person to put on and taking off regular upper  body clothing?: A Little Help from another person to put on and taking off regular lower body clothing?: A Lot 6 Click Score: 18    End of Session Equipment Utilized During Treatment: Gait belt;Rolling walker (2 wheels)  OT Visit Diagnosis: Unsteadiness on feet (R26.81);Other abnormalities of gait and mobility (R26.89);Muscle weakness (generalized) (M62.81)   Activity Tolerance Patient tolerated  treatment well   Patient Left in chair;with call bell/phone within reach;with chair alarm set   Nurse Communication Mobility status        Time: 8127-5170 OT Time Calculation (min): 14 min  Charges: OT General Charges $OT Visit: 1 Visit OT Treatments $Self Care/Home Management : 8-22 mins  Rennie Plowman, MS Acute Rehabilitation Department Office# [phone]   Willa Rough 03/26/2022, 12:37 PM

## 2022-03-26 NOTE — Progress Notes (Signed)
Physical Therapy Treatment Patient Details Name: Catherine Decker MRN: [id number] DOB: 01/02/1941 Today's Date: 03/26/2022   History of Present Illness Patient is a 82 year old female who presented after a fall down some stairs. Patient was found to have left rib fractures 4-5, right rib fractures 7-8, T7 compression fx, T1 spinous process fx, SDH.  Pt also found to have influenza A. VPX:TGGYI breast cancer, arthritis, hyperlipidemia, splenic artery aneurysm    PT Comments    Progressing slowly with mobility. Min guard A this session. Increased time to complete mobility tasks. Ambulation distance limited by fatigue. Assisted pt back to bed at her request. Will continue to progress activity as tolerated.     Recommendations for follow up therapy are one component of a multi-disciplinary discharge planning process, led by the attending physician.  Recommendations may be updated based on patient status, additional functional criteria and insurance authorization.  Follow Up Recommendations  Skilled nursing-short term rehab (<3 hours/day) Can patient physically be transported by private vehicle: Yes   Assistance Recommended at Discharge Frequent or constant Supervision/Assistance  Patient can return home with the following A little help with walking and/or transfers;A little help with bathing/dressing/bathroom;Assistance with cooking/housework;Assist for transportation;Help with stairs or ramp for entrance   Equipment Recommendations  None recommended by PT    Recommendations for Other Services       Precautions / Restrictions Precautions Precautions: Fall Precaution Comments: rib fractures, back precauitons Restrictions Weight Bearing Restrictions: No     Mobility  Bed Mobility Overal bed mobility: Needs Assistance Bed Mobility: Supine to Sit, Sit to Supine     Supine to sit: HOB elevated, Supervision Sit to supine: HOB elevated, Supervision   General bed mobility comments:  Supv for safety. Increased time.    Transfers Overall transfer level: Needs assistance Equipment used: Rolling walker (2 wheels) Transfers: Sit to/from Stand Sit to Stand: Min guard           General transfer comment: Min guard for safety. Cues for safety, hand placement. Increased time.    Ambulation/Gait Ambulation/Gait assistance: Min guard Gait Distance (Feet): 15 Feet (x2) Assistive device: Rolling walker (2 wheels) Gait Pattern/deviations: Step-through pattern, Decreased stride length       General Gait Details: Pt requested assistance for toileting. She was agreeable to ambulate to/from the bathroom. Tolerated short distance well. No LOB with RW use on today. Cues for safety. Mild lightheadedness   Stairs             Wheelchair Mobility    Modified Rankin (Stroke Patients Only)       Balance Overall balance assessment: Needs assistance         Standing balance support: Bilateral upper extremity supported, Reliant on assistive device for balance, During functional activity Standing balance-Leahy Scale: Poor                              Cognition Arousal/Alertness: Awake/alert Behavior During Therapy: WFL for tasks assessed/performed Overall Cognitive Status: Within Functional Limits for tasks assessed                                 General Comments: plesant and cooperative        Exercises      General Comments        Pertinent Vitals/Pain Pain Assessment Pain Assessment: Faces Faces Pain Scale: No hurt  Home Living                          Prior Function            PT Goals (current goals can now be found in the care plan section) Progress towards PT goals: Progressing toward goals    Frequency    Min 3X/week      PT Plan Current plan remains appropriate    Co-evaluation              AM-PAC PT "6 Clicks" Mobility   Outcome Measure  Help needed turning from your back  to your side while in a flat bed without using bedrails?: None Help needed moving from lying on your back to sitting on the side of a flat bed without using bedrails?: A Little Help needed moving to and from a bed to a chair (including a wheelchair)?: A Little Help needed standing up from a chair using your arms (e.g., wheelchair or bedside chair)?: A Little Help needed to walk in hospital room?: A Little Help needed climbing 3-5 steps with a railing? : Total 6 Click Score: 17    End of Session Equipment Utilized During Treatment: Gait belt Activity Tolerance: Patient tolerated treatment well;Patient limited by fatigue Patient left: in bed;with call bell/phone within reach;with bed alarm set   PT Visit Diagnosis: Difficulty in walking, not elsewhere classified (R26.2);Unsteadiness on feet (R26.81);Muscle weakness (generalized) (M62.81)     Time: 4462-8638 PT Time Calculation (min) (ACUTE ONLY): 12 min  Charges:  $Gait Training: 8-22 mins                        Doreatha Massed, PT Acute Rehabilitation  Office: [phone]

## 2022-03-26 NOTE — Progress Notes (Signed)
PROGRESS NOTE    Catherine Decker  XIP:[id number] DOB: [birth year] DOA: 03/14/2022 PCP: Antony Contras, MD   Brief Narrative:  The Patient is an obese 82 year old female history of hypothyroidism, hypertension, as well as other comorbidities who presented to the ED after she fell down the stairs 2 days prior to admission slipped while walking hit her head on the way down but did not suffer any LOC. Patient was able to get up with help from a friend however decided to stay home. Patient's daughter noted that patient had more confusion and fatigue over the past 2 days prior to admission with decreased appetite and a 1-1/2-week history of cough and congestion and subsequently brought to the ED as patient with no clinical improvement.    Patient was seen in the ED, workup done concerning for subdural hematoma, chronic hygromas, multiple rib fractures, pneumonia, influenza A, AKI on CKD stage IIIb. Neurosurgery reviewed films and felt no further workup needed intervention at this time. Trauma surgery also assessed the patient and recommended pain management, pulmonary toileting. Patient admitted placed on empiric IV antibiotics and Tamiflu.  She is subsequently improving but renal function continues to be elevated and now slowly improving.  Will need to ensure adequate pain control.  PT OT still recommending SNF and will discharge when she is little bit more improved from a lab perspective as she is agreeable to SNF.   Legs remain swollen she is going to get another dose of IV Lasix but will need to watch her renal function carefully.   Discussed with Dr. Lorenso Courier yesterday in the morning given that the CT scans that she had last week showed multiple metastatic lesions and he recommended further workup and obtaining a CT-guided biopsy of the left iliac bone marrow biopsy.  She still is a little volume overloaded so we will give her another dose of IV Lasix  Assessment and Plan:  Fall with associated multiple  rib fractures Spinous process fracture of T1 Subdural hematoma/chronic hygromas -Secondary to mechanical fall. -Patient assessed by the trauma team who reviewed imaging and recommended pain management, pulmonary toileting, incentive spirometry use secondary to rib fractures to prevent worsening pneumonia and no further intervention at this time. --Films reviewed by neurosurgery who feel no intervention needed at this time. -pain control, -PT/OT  -go to SNF when she is diuresed    Influenza A/lobar pneumonia (RML and RLL)/lactic acidosis on presentation  -Urine strep pneumo antigen positive, treated with 5 days of Rocephin and azithromycin -Initiated Tamiflu and completed 5 Days -still has intermittent cough, but no hypoxia, no sob, no fever -repeat cxr on 1/17 am showed "Low lung volumes with bronchovascular crowding" but no reports of infiltrates      Volume Overload bilateral pleural effusions , bilateral lower extremity edema /acute on Chronic Diastolic CHF, she also has hypoalbuminemia --BNP was 40.5 but not accurate as patient is obese -Echocardiogram done and showed "Left ventricular ejection fraction, by estimation, is 60 to 65%. The left ventricle has normal  function. The left ventricle has no  regional wall motion abnormalities. The left  ventricular internal cavity size was normal in size. There is   mild left ventricular hypertrophy. Left ventricular diastolic  parameters are consistent with Grade I diastolic dysfunction (impaired relaxation). Elevated left ventricular end-diastolic pressure. " -- Venous Doppler no DVT -Remain volume overloaded, continue IV Lasix with albumin, TED hose placed  Hyponatremia -Likely due to volume overload, continue Lasix , monitor EMP  Abnormal LFTs/Transaminitis/ history of  cirrhosis. -Right upper quadrant ultrasound consistent with cirrhosis with portal hypertension, low-density structure in the left lobe of the liver highlighted on prior  noncontrast CTs up oropharynx. -Could be from liver congestion volume overload/continue Lasix   AKI on CKD stage IIIb Metabolic Acidosis, received bicarb -holding  ARB. -Renal ultrasound negative for hydronephrosis. -Urinalysis with concerns for UTI, urine culture no growth, received abx for PNA -possible component of cardiorenal ? Continue diuresis, monitor cr   hypokalemia Replaced and improved, continue monitor while on Lasix  Hyperlipidemia -Continue to hold home regimen for now    Hypothyroidism -Continue home regimen of Levothyroxine 112 mcg po Daily.   history of Breast Cancer Status postmastectomy/spinal lesions and now Left Iliac Wing Lytic Lesion -Imaging with T1 a right scapular lesion concerning for Metastasis. -CT Chest/Abd/Pelvs done and showed "Osseous metastasis, most apparent as a lytic lesion in the left iliac wing. Suspicion of right acromion process metastasis as well.  -Continue home regimen with Letrozole 2.5  mg po Daily.    -s/p  CT  guided biopsy of the lytic bone lesions as well as multiple myeloma panel, kappa and lambda light chains and LDH; LDH was 256 -Appreciate oncology  and IR input       Pancytopenia -Patient with a pancytopenia being followed by hematology/oncology in the outpatient setting. -Patient with no S/Sx of bleeding ---Anemia panel to be checked and oncology is ordering multiple myeloma panel; anemia panel showed an iron level of 58, UIBC 146, TIBC of 204, saturation ratios of 28%, ferritin level 303, folate level 11.9, vitamin B12 2501 -.Hgb/Hct is relatively stable and Plt Count and WBC count improving , follow heme-onc recommendation -.     Diabetes Mellitus Type 2, non-insulin-dependent -Hemoglobin A1c 5.0 (11/05/2020). -CBG's ranging from 80-138 -Hemoglobin A1c 4.9.   -Continue to hold home  oral meds actos -Discontinued SSI, CBGs.  -Outpatient follow-up.      Hypertension -BP stable and had been somewhat soft. -Hold  home medication losartan -Getting IV albumin with iv lasix   Pressure injury, stage I medial buttocks, POA Pressure Injury 03/14/22 Buttocks Medial;Mid Stage 1 -  Intact skin with non-blanchable redness of a localized area usually over a bony prominence. (Active)  03/14/22 2200  Location: Buttocks  Location Orientation: Medial;Mid  Staging: Stage 1 -  Intact skin with non-blanchable redness of a localized area usually over a bony prominence.  Wound Description (Comments):   Present on Admission: Yes     Obesity -Complicates overall prognosis and care -Estimated body mass index is 30.81 kg/m as calculated from the following:   Height as of this encounter: '5\' 3"'$  (1.6 m).   Weight as of this encounter: 78.9 kg.  -Weight Loss and Dietary Counseling given  DVT prophylaxis: Place TED hose Start: 03/26/22 1847 SCDs Start: 03/14/22 2212 Place and maintain sequential compression device Start: 03/14/22 1706    Code Status: DNR Family Communication: No family currently at bedside  Disposition Plan:  Level of care: Progressive Remains inpatient appropriate because: Needs iv diuresis, remains significantly volume overloaded, SNF once volume improves Needs outpatient follow up with oncology   Consultants:  Medical oncology General Surgery/Trauma Team Dr. Bobbye Morton Neurosurgery  Procedures:  CT head CT C-spine 03/14/2022 Chest x-ray 03/14/2022 Renal ultrasound 03/14/2022 Right upper quadrant ultrasound 03/15/2022 CT chest abdomen and pelvis 03/14/2022 Lytic iliac Wing Bone Marrow Bx 03/25/2022  Antimicrobials:  Anti-infectives (From admission, onward)    Start     Dose/Rate Route Frequency Ordered Stop  03/15/22 1600  azithromycin (ZITHROMAX) 500 mg in sodium chloride 0.9 % 250 mL IVPB  Status:  Discontinued        500 mg 250 mL/hr over 60 Minutes Intravenous Every 24 hours 03/14/22 2211 03/17/22 1759   03/15/22 1000  cefTRIAXone (ROCEPHIN) 2 g in sodium chloride 0.9 % 100 mL IVPB        2  g 200 mL/hr over 30 Minutes Intravenous Every 24 hours 03/14/22 2211 03/20/22 1520   03/15/22 1000  oseltamivir (TAMIFLU) capsule 30 mg        30 mg Oral Daily 03/15/22 0910 03/19/22 1200   03/14/22 1530  cefTRIAXone (ROCEPHIN) 1 g in sodium chloride 0.9 % 100 mL IVPB        1 g 200 mL/hr over 30 Minutes Intravenous  Once 03/14/22 1515 03/14/22 1606   03/14/22 1530  azithromycin (ZITHROMAX) 500 mg in sodium chloride 0.9 % 250 mL IVPB        500 mg 250 mL/hr over 60 Minutes Intravenous  Once 03/14/22 1515 03/14/22 1717       Subjective: She appear weak but oriented x 3, currently denies pain Still have mild intermittent cough, no hypoxia Persistent  bilateral lower extremity pitting edema.   Objective: Vitals:   03/25/22 2116 03/26/22 0518 03/26/22 0637 03/26/22 1253  BP: (!) 122/54  (!) 125/49 109/83  Pulse: 88  78 77  Resp: '20  17 18  '$ Temp: 99.2 F (37.3 C)  98.2 F (36.8 C) 98.1 F (36.7 C)  TempSrc: Oral  Oral Oral  SpO2: 96%  98% 97%  Weight:  78.9 kg    Height:       No intake or output data in the 24 hours ending 03/26/22 1849  Filed Weights   03/23/22 0500 03/24/22 0454 03/26/22 0518  Weight: 78 kg 78.3 kg 78.9 kg   Examination: Physical Exam:  Constitutional: WN/WD obese Caucasian female currently no acute distress Respiratory: Diminished to auscultation bilaterally, no wheezing, rales, rhonchi or crackles. Normal respiratory effort and patient is not tachypenic. No accessory muscle use.  Unlabored breathing Cardiovascular: RRR, no murmurs / rubs / gallops. S1 and S2 auscultated.  Has 1+ lower extremity bilateral pitting edema Abdomen: Soft, non-tender, distended secondary to body habitus bowel sounds positive.  GU: Deferred. Musculoskeletal: + pitting edema bilateral lower extremities. Skin: No rashes, lesions, ulcers on limited skin evaluation. No induration; Warm and dry.  Neurologic: CN 2-12 grossly intact with no focal deficits. Romberg sign and cerebellar  reflexes not assessed.  Psychiatric: Normal judgment and insight. Alert and oriented x 3. Normal mood and appropriate affect.   Data Reviewed: I have personally reviewed following labs and imaging studies  CBC: Recent Labs  Lab 03/22/22 0518 03/23/22 1028 03/24/22 0425 03/25/22 1019 03/26/22 0413  WBC 5.6 6.2 5.1 7.3 5.7  NEUTROABS 4.3 5.1 4.1 5.7 4.6  HGB 8.2* 8.7* 8.3* 9.0* 8.4*  HCT 25.2* 27.7* 25.9* 27.8* 26.1*  MCV 102.9* 107.4* 104.9* 104.5* 103.2*  PLT 109* 106* 95* 121* 474*   Basic Metabolic Panel: Recent Labs  Lab 03/22/22 0518 03/23/22 1028 03/24/22 0425 03/25/22 1019 03/26/22 0413  NA 133* 131* 131* 129* 132*  K 3.4* 4.5 3.8 3.8 3.6  CL 107 105 104 104 105  CO2 18* 19* 20* 18* 19*  GLUCOSE 90 147* 86 85 87  BUN 39* 46* 43* 44* 45*  CREATININE 1.95* 2.13* 2.07* 2.06* 2.03*  CALCIUM 8.4* 8.2* 8.1* 7.9* 8.0*  MG 1.9 2.1  2.0 1.9 1.9  PHOS 3.6 3.4 3.7 4.1 4.0   GFR: Estimated Creatinine Clearance: 22 mL/min (A) (by C-G formula based on SCr of 2.03 mg/dL (H)). Liver Function Tests: Recent Labs  Lab 03/22/22 0518 03/23/22 1028 03/24/22 0425 03/25/22 1019 03/26/22 0413  AST 173* 162* 135* 147* 156*  ALT 38 41 37 41 39  ALKPHOS 171* 177* 162* 213* 187*  BILITOT 1.7* 1.8* 1.9* 1.9* 1.8*  PROT 5.2* 5.2* 4.9* 6.0* 5.1*  ALBUMIN 2.2* 2.2* 2.0* 2.3* 2.1*   No results for input(s): "LIPASE", "AMYLASE" in the last 168 hours. No results for input(s): "AMMONIA" in the last 168 hours. Coagulation Profile: Recent Labs  Lab 03/25/22 0409  INR 1.4*   Cardiac Enzymes: No results for input(s): "CKTOTAL", "CKMB", "CKMBINDEX", "TROPONINI" in the last 168 hours. BNP (last 3 results) No results for input(s): "PROBNP" in the last 8760 hours. HbA1C: No results for input(s): "HGBA1C" in the last 72 hours. CBG: No results for input(s): "GLUCAP" in the last 168 hours. Lipid Profile: No results for input(s): "CHOL", "HDL", "LDLCALC", "TRIG", "CHOLHDL", "LDLDIRECT"  in the last 72 hours. Thyroid Function Tests: No results for input(s): "TSH", "T4TOTAL", "FREET4", "T3FREE", "THYROIDAB" in the last 72 hours. Anemia Panel: Recent Labs    03/25/22 0409  VITAMINB12 2,501*  FOLATE 11.9  FERRITIN 303  TIBC 204*  IRON 58  RETICCTPCT 5.7*   Sepsis Labs: No results for input(s): "PROCALCITON", "LATICACIDVEN" in the last 168 hours.  No results found for this or any previous visit (from the past 240 hour(s)).   Radiology Studies: VAS [place] LOWER EXTREMITY VENOUS (DVT)  Result Date: 03/26/2022  Lower Venous DVT Study Patient Name:  Catherine Decker  Date of Exam:   03/26/2022 Medical Rec #: [id number]        Accession #:    [id number] Date of Birth: December 07, 1940       Patient Gender: F Patient Age:   58 years Exam Location:  [facility] Procedure:      VAS [place] LOWER EXTREMITY VENOUS (DVT) Referring Phys: Annamaria Boots Cheston Coury --------------------------------------------------------------------------------  Indications: Edema.  Limitations: Body habitus and poor ultrasound/tissue interface. Comparison Study: No previous exams Performing Technologist: Jody Hill RVT, RDMS  Examination Guidelines: A complete evaluation includes B-mode imaging, spectral Doppler, color Doppler, and power Doppler as needed of all accessible portions of each vessel. Bilateral testing is considered an integral part of a complete examination. Limited examinations for reoccurring indications may be performed as noted. The reflux portion of the exam is performed with the patient in reverse Trendelenburg.  +---------+---------------+---------+-----------+----------+-------------------+ RIGHT    CompressibilityPhasicitySpontaneityPropertiesThrombus Aging      +---------+---------------+---------+-----------+----------+-------------------+ CFV      Full           Yes      Yes                                      +---------+---------------+---------+-----------+----------+-------------------+ SFJ       Full                                                             +---------+---------------+---------+-----------+----------+-------------------+ FV Prox  Full           Yes  Yes                                      +---------+---------------+---------+-----------+----------+-------------------+ FV Mid   Full           Yes      Yes                                      +---------+---------------+---------+-----------+----------+-------------------+ FV DistalFull           Yes      Yes                                      +---------+---------------+---------+-----------+----------+-------------------+ PFV      Full                                                             +---------+---------------+---------+-----------+----------+-------------------+ POP      Full           Yes      Yes                                      +---------+---------------+---------+-----------+----------+-------------------+ PTV                                                   Not well visualized +---------+---------------+---------+-----------+----------+-------------------+ PERO                                                  Not well visualized +---------+---------------+---------+-----------+----------+-------------------+   +---------+---------------+---------+-----------+----------+--------------+ LEFT     CompressibilityPhasicitySpontaneityPropertiesThrombus Aging +---------+---------------+---------+-----------+----------+--------------+ CFV      Full           Yes      Yes                                 +---------+---------------+---------+-----------+----------+--------------+ SFJ      Full                                                        +---------+---------------+---------+-----------+----------+--------------+ FV Prox  Full           Yes      Yes                                  +---------+---------------+---------+-----------+----------+--------------+ FV Mid   Full           Yes  Yes                                 +---------+---------------+---------+-----------+----------+--------------+ FV DistalFull           Yes      Yes                                 +---------+---------------+---------+-----------+----------+--------------+ PFV      Full                                                        +---------+---------------+---------+-----------+----------+--------------+ POP      Full           Yes      Yes                                 +---------+---------------+---------+-----------+----------+--------------+ PTV      Full                                                        +---------+---------------+---------+-----------+----------+--------------+ PERO     Full                                                        +---------+---------------+---------+-----------+----------+--------------+     Summary: BILATERAL: - No evidence of deep vein thrombosis seen in the lower extremities, bilaterally. -No evidence of popliteal cyst, bilaterally. -Diffuse subcutaneous edema seen bilaterally.  *See table(s) above for measurements and observations.    Preliminary    DG CHEST PORT 1 VIEW  Result Date: 03/26/2022 CLINICAL DATA:  Shortness of breath EXAM: PORTABLE CHEST 1 VIEW COMPARISON:  Chest x-ray March 24, 2022 FINDINGS: The cardiomediastinal silhouette is unchanged in contour. Low lung volumes with bronchovascular crowding. The left costophrenic angle is excluded from the field of view. No focal pulmonary opacity. No pleural effusion or pneumothorax. The visualized upper abdomen is unremarkable. Redemonstrated multiple displaced bilateral rib fractures. IMPRESSION: No acute cardiopulmonary abnormality. Electronically Signed   By: Beryle Flock M.D.   On: 03/26/2022 08:18   CT BIOPSY  Result Date: 03/25/2022 INDICATION:  82 year old female with a history iliac bone lesion referred for biopsy EXAM: CT BIOPSY MEDICATIONS: None. ANESTHESIA/SEDATION: Moderate (conscious) sedation was employed during this procedure. A total of Versed 1.0 mg and Fentanyl 50 mcg was administered intravenously. Moderate Sedation Time: 11 minutes. The patient's level of consciousness and vital signs were monitored continuously by radiology nursing throughout the procedure under my direct supervision. FLUOROSCOPY TIME:  CT COMPLICATIONS: None PROCEDURE: Informed written consent was obtained from the patient after a thorough discussion of the procedural risks, benefits and alternatives. All questions were addressed. Maximal Sterile Barrier Technique was utilized including caps, mask, sterile gowns, sterile gloves, sterile drape, hand hygiene and skin antiseptic. A timeout was performed prior to the initiation  of the procedure. Patient positioned supine on the CT gantry table. Scout CT acquired for planning purposes. The patient is prepped and draped in the usual sterile fashion. 1% lidocaine was used for local anesthesia. Using CT guidance, guide needle was advanced into the lytic lesion of the left iliac crest. Once we confirmed needle tip position, multiple 18 gauge core biopsy were acquired. Specimen placed into formalin. Patient tolerated the procedure well and remained hemodynamically stable throughout. No complications were encountered and no significant blood loss. IMPRESSION: Status post CT-guided biopsy of left iliac crest lytic lesion. Signed, Dulcy Fanny. Nadene Rubins, RPVI [facility] Radiology Electronically Signed   By: Corrie Mckusick D.O.   On: 03/25/2022 16:21    Scheduled Meds:  acetaminophen  1,000 mg Oral Q6H   calcium carbonate  1,250 mg Oral Q breakfast   cholecalciferol  1,000 Units Oral Daily   cyanocobalamin  1,000 mcg Oral Daily   feeding supplement (GLUCERNA SHAKE)  237 mL Oral  TID BM   fluticasone  2 spray Each Nare Daily   folic acid  1 mg Oral Daily   furosemide  40 mg Intravenous Daily   guaiFENesin  1,200 mg Oral BID   hydrocortisone   Rectal TID   [START ON 03/27/2022] letrozole  2.5 mg Oral QODAY   levothyroxine  112 mcg Oral Q0600   loratadine  10 mg Oral Daily   methocarbamol  1,000 mg Oral TID   pantoprazole  40 mg Oral Daily   Continuous Infusions:  albumin human       LOS: 12 days   Florencia Reasons, MD PhD FACP [facility] Available via Epic secure chat 7am-7pm After these hours, please refer to coverage provider listed on [email or web address] 03/26/2022, 6:49 PM

## 2022-03-27 DIAGNOSIS — J181 Lobar pneumonia, unspecified organism: Secondary | ICD-10-CM | POA: Diagnosis not present

## 2022-03-27 DIAGNOSIS — R627 Adult failure to thrive: Secondary | ICD-10-CM | POA: Diagnosis not present

## 2022-03-27 DIAGNOSIS — I5033 Acute on chronic diastolic (congestive) heart failure: Secondary | ICD-10-CM | POA: Diagnosis not present

## 2022-03-27 DIAGNOSIS — J09X1 Influenza due to identified novel influenza A virus with pneumonia: Secondary | ICD-10-CM | POA: Diagnosis not present

## 2022-03-27 LAB — METHYLMALONIC ACID, SERUM: Methylmalonic Acid, Quantitative: 300 nmol/L (ref 0–378)

## 2022-03-27 LAB — BASIC METABOLIC PANEL
Anion gap: 8 (ref 5–15)
BUN: 45 mg/dL — ABNORMAL HIGH (ref 8–23)
CO2: 18 mmol/L — ABNORMAL LOW (ref 22–32)
Calcium: 7.9 mg/dL — ABNORMAL LOW (ref 8.9–10.3)
Chloride: 108 mmol/L (ref 98–111)
Creatinine, Ser: 2.07 mg/dL — ABNORMAL HIGH (ref 0.44–1.00)
GFR, Estimated: 24 mL/min — ABNORMAL LOW (ref >60)
Glucose, Bld: 103 mg/dL — ABNORMAL HIGH (ref 70–99)
Potassium: 3.5 mmol/L (ref 3.5–5.1)
Sodium: 134 mmol/L — ABNORMAL LOW (ref 135–145)

## 2022-03-27 LAB — MAGNESIUM: Magnesium: 1.8 mg/dL (ref 1.7–2.4)

## 2022-03-27 LAB — LACTIC ACID, PLASMA: Lactic Acid, Venous: 2.4 mmol/L (ref 0.5–1.9)

## 2022-03-27 MED ORDER — SODIUM BICARBONATE 650 MG PO TABS
650.0000 mg | ORAL_TABLET | Freq: Two times a day (BID) | ORAL | Status: DC
  Administered 2022-03-27 – 2022-04-03 (×14): 650 mg via ORAL
  Filled 2022-03-27 (×14): qty 1

## 2022-03-27 MED ORDER — SENNOSIDES-DOCUSATE SODIUM 8.6-50 MG PO TABS
1.0000 | ORAL_TABLET | Freq: Two times a day (BID) | ORAL | Status: DC
  Administered 2022-03-27 – 2022-04-03 (×15): 1 via ORAL
  Filled 2022-03-27 (×15): qty 1

## 2022-03-27 MED ORDER — FUROSEMIDE 10 MG/ML IJ SOLN
80.0000 mg | Freq: Every day | INTRAMUSCULAR | Status: DC
  Administered 2022-03-28 – 2022-03-29 (×2): 80 mg via INTRAVENOUS
  Filled 2022-03-27 (×2): qty 8

## 2022-03-27 MED ORDER — ACETAMINOPHEN 500 MG PO TABS
1000.0000 mg | ORAL_TABLET | Freq: Four times a day (QID) | ORAL | Status: DC | PRN
  Administered 2022-04-02: 1000 mg via ORAL
  Filled 2022-03-27: qty 2

## 2022-03-27 MED ORDER — POTASSIUM CHLORIDE CRYS ER 20 MEQ PO TBCR
40.0000 meq | EXTENDED_RELEASE_TABLET | Freq: Every day | ORAL | Status: DC
  Administered 2022-03-27 – 2022-04-03 (×8): 40 meq via ORAL
  Filled 2022-03-27 (×8): qty 2

## 2022-03-27 NOTE — Progress Notes (Signed)
PROGRESS NOTE    Catherine Decker  HGD:[id number] DOB: 1941/01/10 DOA: 03/14/2022 PCP: Antony Contras, MD   Brief Narrative:  The Patient is an obese 82 year old female history of hypothyroidism, hypertension, as well as other comorbidities who presented to the ED after she fell down the stairs 2 days prior to admission slipped while walking hit her head on the way down but did not suffer any LOC. Patient was able to get up with help from a friend however decided to stay home. Patient's daughter noted that patient had more confusion and fatigue over the past 2 days prior to admission with decreased appetite and a 1-1/2-week history of cough and congestion and subsequently brought to the ED as patient with no clinical improvement.    Patient was seen in the ED, workup done concerning for subdural hematoma, chronic hygromas, multiple rib fractures, pneumonia, influenza A, AKI on CKD stage IIIb. Neurosurgery reviewed films and felt no further workup needed intervention at this time. Trauma surgery also assessed the patient and recommended pain management, pulmonary toileting. Patient admitted placed on empiric IV antibiotics and Tamiflu.  She is subsequently improving but renal function continues to be elevated and now slowly improving.  Will need to ensure adequate pain control.  PT OT still recommending SNF and will discharge when she is little bit more improved from a lab perspective as she is agreeable to SNF.   Legs remain swollen she is going to get another dose of IV Lasix but will need to watch her renal function carefully.   Discussed with Dr. Lorenso Courier yesterday in the morning given that the CT scans that she had last week showed multiple metastatic lesions and he recommended further workup and obtaining a CT-guided biopsy of the left iliac bone marrow biopsy.  She still is a little volume overloaded so we will give her another dose of IV Lasix  Assessment and Plan:  Fall with  multiple rib  fractures Spinous process fracture of T1 Subdural hematoma/chronic hygromas -Secondary to mechanical fall , reports She slipped while walking down stairs -Patient assessed by the trauma team who  recommended conservative management with pain control, pulmonary toileting, incentive spirometry use secondary to rib fractures to prevent worsening pneumonia and no further intervention at this time. --Films reviewed by neurosurgery who feel no intervention needed at this time. --PT/OT  -go to SNF when she is diuresed and volume status improves   Influenza A/lobar pneumonia (RML and RLL)/lactic acidosis on presentation  -Urine strep pneumo antigen positive, treated with 5 days of Rocephin and azithromycin -Initiated Tamiflu and completed 5 Days -still has intermittent cough, but no hypoxia, no sob, no fever -repeat cxr on 1/17 am showed "Low lung volumes with bronchovascular crowding" but no reports of infiltrates      Volume Overload bilateral pleural effusions , bilateral lower extremity edema /acute on Chronic Diastolic CHF, she also has hypoalbuminemia --BNP was 40.5 but not accurate as patient is obese -Echocardiogram done and showed "lvef 60-65%, mild left ventricular hypertrophy.  Grade I diastolic dysfunction (impaired relaxation). Elevated left ventricular end-diastolic pressure. " -- Venous Doppler no DVT -Remain volume overloaded, will try higher dose of  IV Lasix with albumin, TED hose placed  Hyponatremia -Likely due to volume overload, continue Lasix , monitor BMP  hypokalemia Replaced and improved, continue monitor while on Lasix  Abnormal LFTs/Transaminitis/ history of cirrhosis. -Right upper quadrant ultrasound consistent with cirrhosis with portal hypertension, low-density structure in the left lobe of the liver highlighted on  prior noncontrast CTs up oropharynx. -Could be from liver congestion volume overload/continue Lasix   AKI on CKD stage IIIb Metabolic Acidosis,  received bicarb -holding  ARB. -Renal ultrasound negative for hydronephrosis. -Urinalysis with concerns for UTI, urine culture no growth, received abx for PNA -possible component of cardiorenal ? Continue diuresis, monitor cr     Hyperlipidemia -Continue to hold home regimen for now    Hypothyroidism -Continue home regimen of Levothyroxine 112 mcg po Daily.   history of Breast Cancer Status postmastectomy/spinal lesions and now Left Iliac Wing Lytic Lesion -Imaging with T1 a right scapular lesion concerning for Metastasis. -CT Chest/Abd/Pelvs done and showed "Osseous metastasis, most apparent as a lytic lesion in the left iliac wing. Suspicion of right acromion process metastasis as well.  -Continue home regimen with Letrozole 2.5 mg po Daily.    -s/p  CT  guided biopsy of the lytic bone lesions + metastatic carcinoma, additional testing in process Oncology also ordered following test:  -multiple myeloma panel in process - kappa and lambda light chains elevated but with normal ratio (possible related to ckd) - LDH elevated at  256 -Appreciate oncology  and IR input, will follow recommendation       Pancytopenia -Patient with a pancytopenia being followed by hematology/oncology in the outpatient setting. -Patient with no S/Sx of bleeding, stool is brown ---oncology is checking multiple myeloma panel; - anemia panel showed  an iron level of 58, UIBC 146, TIBC of 204, saturation ratios of 28%, ferritin level 303, folate level 11.9, vitamin B12 2501, elevated retic count  -.Hgb/Hct is relatively stable and Plt Count and WBC count improving , follow heme-onc recommendation      Diabetes Mellitus Type 2, non-insulin-dependent -Hemoglobin A1c 5.0 (11/05/2020). -CBG's ranging from 80-138 -Hemoglobin A1c 4.9.   -discontinue home oral meds actos ( can also cause edema) -Discontinued SSI, CBGs.  -Outpatient follow-up.      Hypertension -BP stable and had been somewhat soft. -Hold  home medication losartan -Getting IV albumin with iv lasix   Pressure injury, stage I medial buttocks, POA Pressure Injury 03/14/22 Buttocks Medial;Mid Stage 1 -  Intact skin with non-blanchable redness of a localized area usually over a bony prominence. (Active)  03/14/22 2200  Location: Buttocks  Location Orientation: Medial;Mid  Staging: Stage 1 -  Intact skin with non-blanchable redness of a localized area usually over a bony prominence.  Wound Description (Comments):   Present on Admission: Yes       Obesity -Complicates overall prognosis and care -Estimated body mass index is 31.75 kg/m as calculated from the following:   Height as of this encounter: '5\' 3"'$  (1.6 m).   Weight as of this encounter: 81.3 kg.  -Weight Loss and Dietary Counseling given  DVT prophylaxis: Place TED hose Start: 03/26/22 1847 SCDs Start: 03/14/22 2212 Place and maintain sequential compression device Start: 03/14/22 1706    Code Status: DNR Family Communication: daughter over the phone on 1/18  Disposition Plan:  Level of care: Progressive Remains inpatient appropriate because: Needs iv diuresis, remains significantly volume overloaded, SNF once volume improves Needs outpatient follow up with oncology   Consultants:  Medical oncology General Surgery/Trauma Team Dr. Bobbye Morton Neurosurgery  Procedures:  CT head CT C-spine 03/14/2022 Chest x-ray 03/14/2022 Renal ultrasound 03/14/2022 Right upper quadrant ultrasound 03/15/2022 CT chest abdomen and pelvis 03/14/2022 Lytic iliac Wing Bone Marrow Bx 03/25/2022  Antimicrobials:  Anti-infectives (From admission, onward)    Start     Dose/Rate Route Frequency Ordered  Stop   03/15/22 1600  azithromycin (ZITHROMAX) 500 mg in sodium chloride 0.9 % 250 mL IVPB  Status:  Discontinued        500 mg 250 mL/hr over 60 Minutes Intravenous Every 24 hours 03/14/22 2211 03/17/22 1759   03/15/22 1000  cefTRIAXone (ROCEPHIN) 2 g in sodium chloride 0.9 % 100 mL IVPB         2 g 200 mL/hr over 30 Minutes Intravenous Every 24 hours 03/14/22 2211 03/20/22 1520   03/15/22 1000  oseltamivir (TAMIFLU) capsule 30 mg        30 mg Oral Daily 03/15/22 0910 03/19/22 1200   03/14/22 1530  cefTRIAXone (ROCEPHIN) 1 g in sodium chloride 0.9 % 100 mL IVPB        1 g 200 mL/hr over 30 Minutes Intravenous  Once 03/14/22 1515 03/14/22 1606   03/14/22 1530  azithromycin (ZITHROMAX) 500 mg in sodium chloride 0.9 % 250 mL IVPB        500 mg 250 mL/hr over 60 Minutes Intravenous  Once 03/14/22 1515 03/14/22 1717       Subjective: She appear weak but oriented x 3, currently denies pain Still have mild intermittent cough, no hypoxia Persistent  bilateral lower extremity pitting edema.    Objective: Vitals:   03/26/22 2011 03/27/22 0459 03/27/22 0522 03/27/22 1156  BP: 110/64 (!) 111/44  (!) 119/47  Pulse: 76 75  80  Resp: '18 20  16  '$ Temp: (!) 97.5 F (36.4 C) 98.1 F (36.7 C)  97.8 F (36.6 C)  TempSrc: Oral Oral  Oral  SpO2: 100% 97%  100%  Weight:   81.3 kg   Height:        Intake/Output Summary (Last 24 hours) at 03/27/2022 1610 Last data filed at 03/27/2022 1500 Gross per 24 hour  Intake 483.47 ml  Output 615 ml  Net -131.53 ml    Filed Weights   03/24/22 0454 03/26/22 0518 03/27/22 0522  Weight: 78.3 kg 78.9 kg 81.3 kg   Examination: Physical Exam:  Constitutional: WN/WD obese Caucasian female currently no acute distress Respiratory: Diminished to auscultation bilaterally, no wheezing, rales, rhonchi or crackles. Normal respiratory effort and patient is not tachypenic. No accessory muscle use.  Unlabored breathing Cardiovascular: RRR, no murmurs / rubs / gallops. S1 and S2 auscultated.  Has 1+ lower extremity bilateral pitting edema Abdomen: Soft, non-tender, distended secondary to body habitus bowel sounds positive.  GU: Deferred. Musculoskeletal: + pitting edema bilateral lower extremities. Skin: No rashes, lesions, ulcers on limited skin  evaluation. No induration; Warm and dry.  Neurologic: CN 2-12 grossly intact with no focal deficits. Romberg sign and cerebellar reflexes not assessed.  Psychiatric: Normal judgment and insight. Alert and oriented x 3. Normal mood and appropriate affect.   Data Reviewed: I have personally reviewed following labs and imaging studies  CBC: Recent Labs  Lab 03/22/22 0518 03/23/22 1028 03/24/22 0425 03/25/22 1019 03/26/22 0413  WBC 5.6 6.2 5.1 7.3 5.7  NEUTROABS 4.3 5.1 4.1 5.7 4.6  HGB 8.2* 8.7* 8.3* 9.0* 8.4*  HCT 25.2* 27.7* 25.9* 27.8* 26.1*  MCV 102.9* 107.4* 104.9* 104.5* 103.2*  PLT 109* 106* 95* 121* 696*   Basic Metabolic Panel: Recent Labs  Lab 03/22/22 0518 03/23/22 1028 03/24/22 0425 03/25/22 1019 03/26/22 0413 03/27/22 1002  NA 133* 131* 131* 129* 132* 134*  K 3.4* 4.5 3.8 3.8 3.6 3.5  CL 107 105 104 104 105 108  CO2 18* 19* 20* 18* 19* 18*  GLUCOSE 90 147* 86 85 87 103*  BUN 39* 46* 43* 44* 45* 45*  CREATININE 1.95* 2.13* 2.07* 2.06* 2.03* 2.07*  CALCIUM 8.4* 8.2* 8.1* 7.9* 8.0* 7.9*  MG 1.9 2.1 2.0 1.9 1.9 1.8  PHOS 3.6 3.4 3.7 4.1 4.0  --    GFR: Estimated Creatinine Clearance: 21.9 mL/min (A) (by C-G formula based on SCr of 2.07 mg/dL (H)). Liver Function Tests: Recent Labs  Lab 03/22/22 0518 03/23/22 1028 03/24/22 0425 03/25/22 1019 03/26/22 0413  AST 173* 162* 135* 147* 156*  ALT 38 41 37 41 39  ALKPHOS 171* 177* 162* 213* 187*  BILITOT 1.7* 1.8* 1.9* 1.9* 1.8*  PROT 5.2* 5.2* 4.9* 6.0* 5.1*  ALBUMIN 2.2* 2.2* 2.0* 2.3* 2.1*   No results for input(s): "LIPASE", "AMYLASE" in the last 168 hours. No results for input(s): "AMMONIA" in the last 168 hours. Coagulation Profile: Recent Labs  Lab 03/25/22 0409  INR 1.4*   Cardiac Enzymes: No results for input(s): "CKTOTAL", "CKMB", "CKMBINDEX", "TROPONINI" in the last 168 hours. BNP (last 3 results) No results for input(s): "PROBNP" in the last 8760 hours. HbA1C: No results for input(s):  "HGBA1C" in the last 72 hours. CBG: No results for input(s): "GLUCAP" in the last 168 hours. Lipid Profile: No results for input(s): "CHOL", "HDL", "LDLCALC", "TRIG", "CHOLHDL", "LDLDIRECT" in the last 72 hours. Thyroid Function Tests: No results for input(s): "TSH", "T4TOTAL", "FREET4", "T3FREE", "THYROIDAB" in the last 72 hours. Anemia Panel: Recent Labs    03/25/22 0409  VITAMINB12 2,501*  FOLATE 11.9  FERRITIN 303  TIBC 204*  IRON 58  RETICCTPCT 5.7*   Sepsis Labs: Recent Labs  Lab 03/27/22 0407  LATICACIDVEN 2.4*    No results found for this or any previous visit (from the past 240 hour(s)).   Radiology Studies: VAS [place] LOWER EXTREMITY VENOUS (DVT)  Result Date: 03/26/2022  Lower Venous DVT Study Patient Name:  LATANZA PFEFFERKORN  Date of Exam:   03/26/2022 Medical Rec #: [id number]        Accession #:    [id number] Date of Birth: [birth year]       Patient Gender: F Patient Age:   30 years Exam Location:  [facility] Procedure:      VAS [place] LOWER EXTREMITY VENOUS (DVT) Referring Phys: Annamaria Boots Piper [place] --------------------------------------------------------------------------------  Indications: Edema.  Limitations: Body habitus and poor ultrasound/tissue interface. Comparison Study: No previous exams Performing Technologist: Jody Hill RVT, RDMS  Examination Guidelines: A complete evaluation includes B-mode imaging, spectral Doppler, color Doppler, and power Doppler as needed of all accessible portions of each vessel. Bilateral testing is considered an integral part of a complete examination. Limited examinations for reoccurring indications may be performed as noted. The reflux portion of the exam is performed with the patient in reverse Trendelenburg.  +---------+---------------+---------+-----------+----------+-------------------+ RIGHT    CompressibilityPhasicitySpontaneityPropertiesThrombus Aging       +---------+---------------+---------+-----------+----------+-------------------+ CFV      Full           Yes      Yes                                      +---------+---------------+---------+-----------+----------+-------------------+ SFJ      Full                                                             +---------+---------------+---------+-----------+----------+-------------------+  FV Prox  Full           Yes      Yes                                      +---------+---------------+---------+-----------+----------+-------------------+ FV Mid   Full           Yes      Yes                                      +---------+---------------+---------+-----------+----------+-------------------+ FV DistalFull           Yes      Yes                                      +---------+---------------+---------+-----------+----------+-------------------+ PFV      Full                                                             +---------+---------------+---------+-----------+----------+-------------------+ POP      Full           Yes      Yes                                      +---------+---------------+---------+-----------+----------+-------------------+ PTV                                                   Not well visualized +---------+---------------+---------+-----------+----------+-------------------+ PERO                                                  Not well visualized +---------+---------------+---------+-----------+----------+-------------------+   +---------+---------------+---------+-----------+----------+--------------+ LEFT     CompressibilityPhasicitySpontaneityPropertiesThrombus Aging +---------+---------------+---------+-----------+----------+--------------+ CFV      Full           Yes      Yes                                 +---------+---------------+---------+-----------+----------+--------------+ SFJ      Full                                                         +---------+---------------+---------+-----------+----------+--------------+ FV Prox  Full           Yes      Yes                                 +---------+---------------+---------+-----------+----------+--------------+  FV Mid   Full           Yes      Yes                                 +---------+---------------+---------+-----------+----------+--------------+ FV DistalFull           Yes      Yes                                 +---------+---------------+---------+-----------+----------+--------------+ PFV      Full                                                        +---------+---------------+---------+-----------+----------+--------------+ POP      Full           Yes      Yes                                 +---------+---------------+---------+-----------+----------+--------------+ PTV      Full                                                        +---------+---------------+---------+-----------+----------+--------------+ PERO     Full                                                        +---------+---------------+---------+-----------+----------+--------------+     Summary: BILATERAL: - No evidence of deep vein thrombosis seen in the lower extremities, bilaterally. -No evidence of popliteal cyst, bilaterally. -Diffuse subcutaneous edema seen bilaterally.  *See table(s) above for measurements and observations. Electronically signed by Harold Barban MD on 03/26/2022 at 8:33:56 PM.    Final    DG CHEST PORT 1 VIEW  Result Date: 03/26/2022 CLINICAL DATA:  Shortness of breath EXAM: PORTABLE CHEST 1 VIEW COMPARISON:  Chest x-ray March 24, 2022 FINDINGS: The cardiomediastinal silhouette is unchanged in contour. Low lung volumes with bronchovascular crowding. The left costophrenic angle is excluded from the field of view. No focal pulmonary opacity. No pleural effusion or pneumothorax. The visualized upper  abdomen is unremarkable. Redemonstrated multiple displaced bilateral rib fractures. IMPRESSION: No acute cardiopulmonary abnormality. Electronically Signed   By: Beryle Flock M.D.   On: 03/26/2022 08:18    Scheduled Meds:  calcium carbonate  1,250 mg Oral Q breakfast   cholecalciferol  1,000 Units Oral Daily   cyanocobalamin  1,000 mcg Oral Daily   feeding supplement (GLUCERNA SHAKE)  237 mL Oral TID BM   fluticasone  2 spray Each Nare Daily   folic acid  1 mg Oral Daily   furosemide  40 mg Intravenous Daily   guaiFENesin  1,200 mg Oral BID   hydrocortisone   Rectal TID   letrozole  2.5 mg Oral QODAY   levothyroxine  112 mcg Oral Q0600  loratadine  10 mg Oral Daily   methocarbamol  1,000 mg Oral TID   pantoprazole  40 mg Oral Daily   senna-docusate  1 tablet Oral BID   Continuous Infusions:  albumin human 12.5 g (03/27/22 1013)     LOS: 13 days   Florencia Reasons, MD PhD FACP [facility] Available via Epic secure chat 7am-7pm After these hours, please refer to coverage provider listed on [email or web address] 03/27/2022, 4:10 PM

## 2022-03-27 NOTE — Care Management Important Message (Signed)
Important Message  Patient Details IM Letter given. Name: Catherine Decker MRN: [id number] Date of Birth: 10-01-1940   Medicare Important Message Given:  Yes     Kerin Salen 03/27/2022, 12:30 PM

## 2022-03-28 ENCOUNTER — Ambulatory Visit
Admit: 2022-03-28 | Discharge: 2022-03-28 | Disposition: A | Discharge status: Home / Self Care | Payer: Medicare HMO | Attending: Radiation Oncology | Admitting: Radiation Oncology

## 2022-03-28 DIAGNOSIS — C801 Malignant (primary) neoplasm, unspecified: Secondary | ICD-10-CM

## 2022-03-28 DIAGNOSIS — R627 Adult failure to thrive: Secondary | ICD-10-CM | POA: Diagnosis not present

## 2022-03-28 DIAGNOSIS — J181 Lobar pneumonia, unspecified organism: Secondary | ICD-10-CM | POA: Diagnosis not present

## 2022-03-28 DIAGNOSIS — C7951 Secondary malignant neoplasm of bone: Secondary | ICD-10-CM | POA: Diagnosis not present

## 2022-03-28 DIAGNOSIS — J09X1 Influenza due to identified novel influenza A virus with pneumonia: Secondary | ICD-10-CM | POA: Diagnosis not present

## 2022-03-28 DIAGNOSIS — C50411 Malignant neoplasm of upper-outer quadrant of right female breast: Secondary | ICD-10-CM

## 2022-03-28 DIAGNOSIS — I5033 Acute on chronic diastolic (congestive) heart failure: Secondary | ICD-10-CM | POA: Diagnosis not present

## 2022-03-28 LAB — COMPREHENSIVE METABOLIC PANEL
ALT: 37 U/L (ref 0–44)
AST: 144 U/L — ABNORMAL HIGH (ref 15–41)
Albumin: 2.3 g/dL — ABNORMAL LOW (ref 3.5–5.0)
Alkaline Phosphatase: 203 U/L — ABNORMAL HIGH (ref 38–126)
Anion gap: 12 (ref 5–15)
BUN: 43 mg/dL — ABNORMAL HIGH (ref 8–23)
CO2: 17 mmol/L — ABNORMAL LOW (ref 22–32)
Calcium: 8.1 mg/dL — ABNORMAL LOW (ref 8.9–10.3)
Chloride: 107 mmol/L (ref 98–111)
Creatinine, Ser: 1.86 mg/dL — ABNORMAL HIGH (ref 0.44–1.00)
GFR, Estimated: 27 mL/min — ABNORMAL LOW (ref >60)
Glucose, Bld: 91 mg/dL (ref 70–99)
Potassium: 3.8 mmol/L (ref 3.5–5.1)
Sodium: 136 mmol/L (ref 135–145)
Total Bilirubin: 1.6 mg/dL — ABNORMAL HIGH (ref 0.3–1.2)
Total Protein: 5.5 g/dL — ABNORMAL LOW (ref 6.5–8.1)

## 2022-03-28 LAB — CBC
HCT: 24.4 % — ABNORMAL LOW (ref 36.0–46.0)
Hemoglobin: 7.7 g/dL — ABNORMAL LOW (ref 12.0–15.0)
MCH: 33.8 pg (ref 26.0–34.0)
MCHC: 31.6 g/dL (ref 30.0–36.0)
MCV: 107 fL — ABNORMAL HIGH (ref 80.0–100.0)
Platelets: 82 10*3/uL — ABNORMAL LOW (ref 150–400)
RBC: 2.28 MIL/uL — ABNORMAL LOW (ref 3.87–5.11)
RDW: 21.3 % — ABNORMAL HIGH (ref 11.5–15.5)
WBC: 3.6 10*3/uL — ABNORMAL LOW (ref 4.0–10.5)
nRBC: 0 % (ref 0.0–0.2)

## 2022-03-28 NOTE — Progress Notes (Signed)
Hematology/Oncology Progress Note  Clinical Summary: [name] is an 82 year old female with medical history significant for cirrhosis and recurrent breast cancer who presents for a fall from standing with subsequent subdural hematoma.  Interval History: -- initial results of biopsy consistent with metastatic carcinoma, additional testing to narrow down the etiology. Suspect recurrent breast cancer.  --Discussed the results of the biopsy with the patient and her daughter today. -- Labs today show white blood cell count 3.6, hemoglobin 7.7, MCV 107, and platelets of 82. --Currently working with physical therapy and considering discharge to rehab facility. --Patient denies any fevers, chills, sweats, nausea, vomiting or diarrhea.  O:  Vitals:   03/28/22 0524 03/28/22 1329  BP: (!) 128/58 (!) 127/48  Pulse: 92 89  Resp: 20 18  Temp: 98.7 F (37.1 C) 98.4 F (36.9 C)  SpO2: 98% 99%      Latest Ref Rng & Units 03/28/2022    4:06 AM 03/27/2022   10:02 AM 03/26/2022    4:13 AM  CMP  Glucose 70 - 99 mg/dL 91  103  87   BUN 8 - 23 mg/dL 43  45  45   Creatinine 0.44 - 1.00 mg/dL 1.86  2.07  2.03   Sodium 135 - 145 mmol/L 136  134  132   Potassium 3.5 - 5.1 mmol/L 3.8  3.5  3.6   Chloride 98 - 111 mmol/L 107  108  105   CO2 22 - 32 mmol/L '17  18  19   '$ Calcium 8.9 - 10.3 mg/dL 8.1  7.9  8.0   Total Protein 6.5 - 8.1 g/dL 5.5   5.1   Total Bilirubin 0.3 - 1.2 mg/dL 1.6   1.8   Alkaline Phos 38 - 126 U/L 203   187   AST 15 - 41 U/L 144   156   ALT 0 - 44 U/L 37   39       Latest Ref Rng & Units 03/28/2022    4:06 AM 03/26/2022    4:13 AM 03/25/2022   10:19 AM  CBC  WBC 4.0 - 10.5 K/uL 3.6  5.7  7.3   Hemoglobin 12.0 - 15.0 g/dL 7.7  8.4  9.0   Hematocrit 36.0 - 46.0 % 24.4  26.1  27.8   Platelets 150 - 400 K/uL 82  101  121       GENERAL: well appearing elderly Caucasian female in NAD  SKIN: skin color, texture, turgor are normal, no rashes or significant lesions EYES:  conjunctiva are pink and non-injected, sclera clear LUNGS: clear to auscultation and percussion with normal breathing effort HEART: regular rate & rhythm and no murmurs and no lower extremity edema Musculoskeletal: no cyanosis of digits and no clubbing  PSYCH: alert & oriented x 3, fluent speech NEURO: no focal motor/sensory deficits  Assessment/Plan: [name] is an 82 year old female with medical history significant for cirrhosis and recurrent breast cancer who presents for a fall from standing with subsequent subdural hematoma.  At this time findings are concerning for recurrent breast cancer with metastatic disease.  This may potentially represent a new primary, however CT scan did not show any evidence of a new primary lesion.  Additionally this may represent multiple myeloma.  The best way to determine this would be to perform a biopsy of one of the bone lesions.  We will order serological studies as well in order to assess for other possible causes.  The patient voiced understanding of  the plan moving forward.  # Lytic Bone Lesions # Metastatic Carcinoma, Additional testing underway.  -- Due to history of breast cancer high concern for recurrence. -- s/p IR guided biopsy of lytic lesions of the bone, initial pathology is concerning for carcinoma.  Additional stains will help determine the tissue origin. --No barrier to discharge from an oncological standpoint.  We will plan for outpatient follow-up.  Will continue to discuss with patient the results of her biopsy as they become available. -- Awaiting results of multiple myeloma panel, kappa and lambda light chains, and LDH. -- Oncology service will continue to follow.  # Macrocytic Anemia #Thrombocytopenia -- No clear etiology based on initial nutritional workup --Findings are potentially secondary to chronic disease/metastatic carcinoma. --MM workup as above.   Ledell Peoples, MD Department of Hematology/Oncology [facility] Phone: [phone] Pager: [phone] Email: [email or web address]

## 2022-03-28 NOTE — Progress Notes (Signed)
Radiation Oncology         ([phone]) [phone] ________________________________  Name: Catherine Decker        MRN: [id number]  Date of Service: 03/28/2022 DOB: Sep 19, 1940  YB:[id number], Catherine Brow, MD  Catherine Slick, MD     REFERRING PHYSICIAN: Orson Slick, MD   DIAGNOSIS: The encounter diagnosis was Malignant neoplasm of upper-outer quadrant of right breast in female, estrogen receptor positive ([place]).   HISTORY OF PRESENT ILLNESS: Catherine Decker is a 82 y.o. female seen at the request of Dr. Lorenso Decker. She has a previous history of right breast cancer s/p lumpectomy and radiation therapy in 2000. She declined chemotherapy at that time and only received 6 months of tamoxifen therapy. Mammogram on 06/16/19 showed a new 1.4 cm mass in the right breast. Biopsy performed showed ER 90% positive, PR 20% positive, and HER2 negative. She underwent right mastectomy with sentinel lymph node mapping on 09/07/19. Pathology showed grade 2 invasive lobular carcinoma 1.4 cm with no evidence of lymph node involvement. Prognostics showed ER 100% positive with strong staining, PR negative, HER2 negative, Ki-67 10%. She started on letrozole therapy on 10/11/19 under the care of Dr. Lorenso Decker.   Since then, she presented to the ED on 03/14/22 after she fell down the stairs 2 days prior and hit her head. Patient's daughter noted the patient had more confusion, fatigue, and decreased appetite as well as a 1 week history of cough and congestion and brought her to the ED. Patient was worked up for concerning subdural hematoma, multiple rib fractures, pneumonia, influenza A, and AKI on CKD stage IIIB. CT CAP on 03/14/22 showed osseous metastasis, most apparent as a lytic lesion in the left iliac wing as well as a lesion on the right acromion suspicious for metastasis. It also showed bilateral rib fractures; thoracolumbar compression deformities of indeterminate acuity; right lower and right middle lobe consolidation favoring pneumonia;  concurrent peribronchovascular nodularity which is most likely infectious; somewhat atypical appearance of pulmonary metastasis or primary bronchogenic carcinoma for which a follow-up chest CT was recommended; cirrhosis and portal vein hypertension with new small volume abdominopelvic ascites, and hypoattenuation in the central left lower lobe which could be representative of an enlarged left portal vein but is indeterminate on this noncontrast exam. CT of the head on 03/14/22 showed bilateral low-attenuation sub dural fluid collections overlie the cerebral image spheres compatible with chronic subdural hygromas, a 4 mm intermediate density subpleural fluid collection which favored to represent a subacute subdural hematoma, acute mildly displaced fracture involving the posterior spinous process of T1, a lucent lesion is identified within the posterior aspect of the T1 vertebral body measuring 1.4 cm concerning for metastatic disease.  Patient was admitted and placed on empiric IV antibiotics and Tamiflu for suspected infection.   Dr. Lorenso Decker was consulted and recommended IR guided biopsy to the concerning bone lesion. Pathology from IR biopsy of the left iliac bone biopsy on 03/25/22 was concerning for carcinoma. Additional stains will help determine the tissue of origin. Results of multiple myeloma panel, kappa and lambda light chains, and LDH are currently pending. Patient's daughter stated patient had complained of some hip pain which prompted a radiation oncology consult.   Patient was seen at bedside today. She states she has some pain in her hip occasionally. She rates this pain a 2/10 and experiences it when she is walking up and down stairs. She does endorse some lower back pain that is constant and rates it an  8/10. She denies any shoulder pain or pain elsewhere in the body. She denies any numbness or tingling. She endorses longstanding issues with urinary incontinence. She is able to ambulate with a  walker.   PREVIOUS RADIATION THERAPY: No   PAST MEDICAL HISTORY:  Past Medical History:  Diagnosis Date   Anemia    Arthritis of back    Arthritis of hand    per patient right hand   Autoimmune deficiency syndrome ([place])    per patient   Breast cancer ([place])    Cancer ([place]) 08/2019   right breast ILC   Cirrhosis ([place])    Cirrhosis of liver ([place])    Complication of anesthesia    per patient, slow to awake   Diabetes mellitus    GERD (gastroesophageal reflux disease)    Also, Hiatal Hernia    H/O migraine    Hepatitis    per patient, had infectious hepatitis as a 82 year old   Hyperlipidemia    Hypertension    Hypothyroidism    Splenic artery aneurysm ([place])        PAST SURGICAL HISTORY: Past Surgical History:  Procedure Laterality Date   APPENDECTOMY     BREAST LUMPECTOMY     Right breast lumpectomy   CATARACT EXTRACTION W/ INTRAOCULAR LENS  IMPLANT, BILATERAL  2018   CHOLECYSTECTOMY  04/2010   MASTECTOMY W/ SENTINEL NODE BIOPSY Right 09/07/2019   Procedure: RIGHT SIMPLE MASTECTOMY WITH SENTINEL LYMPH NODE MAPPING;  Surgeon: Erroll Luna, MD;  Location: [facility];  Service: General;  Laterality: Right;  PEC BLOCK   SCAR REVISION Right 06/21/2020   Procedure: SCAR REVISION OF RIGHT MASTECTOMY;  Surgeon: Erroll Luna, MD;  Location: [facility];  Service: General;  Laterality: Right;   TONSILLECTOMY     TUBAL LIGATION  1975     FAMILY HISTORY:  Family History  Problem Relation Age of Onset   Diabetes Mother    Heart disease Mother    Hypertension Mother    Breast cancer Mother    Heart disease Father    Heart attack Father    Prostate cancer Father    Hypertension Sister    Varicose Veins Sister    Hyperlipidemia Sister    Colon cancer Sister    Heart disease Brother    Diabetes Brother    Hypertension Brother    Prostate cancer Brother    Cirrhosis Brother    Hypertension Brother    Hypertension Daughter    Hypertension Son    Liver  cancer Neg Hx    Osteoporosis Neg Hx      SOCIAL HISTORY:  reports that she has never smoked. She has never used smokeless tobacco. She reports that she does not drink alcohol and does not use drugs.   ALLERGIES: Demerol, Meperidine, and Meperidine hcl   MEDICATIONS:  No current facility-administered medications for this encounter.   No current outpatient medications on file.   Facility-Administered Medications Ordered in Other Encounters  Medication Dose Route Frequency Provider Last Rate Last Admin   acetaminophen (TYLENOL) tablet 1,000 mg  1,000 mg Oral Q6H PRN Florencia Reasons, MD       albumin human 25 % solution 12.5 g  12.5 g Intravenous Daily Florencia Reasons, MD 60 mL/hr at 03/28/22 1035 12.5 g at 03/28/22 1035   calcium carbonate (OS-CAL - dosed in mg of elemental calcium) tablet 1,250 mg  1,250 mg Oral Q breakfast Kyle, Tyrone A, DO   1,250 mg at  03/28/22 1034   cholecalciferol (VITAMIN D3) 25 MCG (1000 UNIT) tablet 1,000 Units  1,000 Units Oral Daily Kyle, Tyrone A, DO   1,000 Units at 03/28/22 1034   cyanocobalamin (VITAMIN B12) tablet 1,000 mcg  1,000 mcg Oral Daily Kyle, Tyrone A, DO   1,000 mcg at 03/28/22 1035   feeding supplement (GLUCERNA SHAKE) (GLUCERNA SHAKE) liquid 237 mL  237 mL Oral TID BM Florencia Reasons, MD   237 mL at 03/28/22 1646   fluticasone (FLONASE) 50 MCG/ACT nasal spray 2 spray  2 spray Each Nare Daily Eugenie Filler, MD   2 spray at 91/47/82 9562   folic acid (FOLVITE) tablet 1 mg  1 mg Oral Daily Kyle, Tyrone A, DO   1 mg at 03/28/22 1034   furosemide (LASIX) injection 80 mg  80 mg Intravenous Daily Florencia Reasons, MD   80 mg at 03/28/22 1033   guaiFENesin (MUCINEX) 12 hr tablet 1,200 mg  1,200 mg Oral BID Eugenie Filler, MD   1,200 mg at 03/28/22 1034   HYDROcodone bit-homatropine (HYCODAN) 5-1.5 MG/5ML syrup 5 mL  5 mL Oral Q6H PRN Eugenie Filler, MD   5 mL at 03/19/22 1856   hydrocortisone (ANUSOL-HC) 2.5 % rectal cream   Rectal TID Kerney Elbe, DO   1  Application at 13/08/65 1646   ipratropium-albuterol (DUONEB) 0.5-2.5 (3) MG/3ML nebulizer solution 3 mL  3 mL Nebulization Q4H PRN Marylyn Ishihara, Tyrone A, DO       letrozole [facility]) tablet 2.5 mg  2.5 mg Oral Thomes Lolling IV, MD   2.5 mg at 03/27/22 1008   levothyroxine (SYNTHROID) tablet 112 mcg  112 mcg Oral Q0600 Cherylann Ratel A, DO   112 mcg at 03/28/22 0643   loratadine (CLARITIN) tablet 10 mg  10 mg Oral Daily Eugenie Filler, MD   10 mg at 03/28/22 1034   methocarbamol (ROBAXIN) tablet 1,000 mg  1,000 mg Oral TID Cherylann Ratel A, DO   1,000 mg at 03/28/22 1646   morphine (PF) 2 MG/ML injection 1-2 mg  1-2 mg Intravenous Q4H PRN Marylyn Ishihara, Tyrone A, DO       oxyCODONE (Oxy IR/ROXICODONE) immediate release tablet 2.5-5 mg  2.5-5 mg Oral Q4H PRN Marylyn Ishihara, Tyrone A, DO   5 mg at 03/17/22 2120   pantoprazole (PROTONIX) EC tablet 40 mg  40 mg Oral Daily Kyle, Tyrone A, DO   40 mg at 03/28/22 1034   potassium chloride SA (KLOR-CON M) CR tablet 40 mEq  40 mEq Oral Daily Florencia Reasons, MD   40 mEq at 03/28/22 1033   prochlorperazine (COMPAZINE) injection 5 mg  5 mg Intravenous Q6H PRN Eugenie Filler, MD   5 mg at 03/15/22 1535   senna-docusate (Senokot-S) tablet 1 tablet  1 tablet Oral BID Florencia Reasons, MD   1 tablet at 03/28/22 1034   sodium bicarbonate tablet 650 mg  650 mg Oral BID Florencia Reasons, MD   650 mg at 03/28/22 1033     REVIEW OF SYSTEMS: Per HPI above.      PHYSICAL EXAM:  Wt Readings from Last 3 Encounters:  03/28/22 175 lb 14.8 oz (79.8 kg)  01/24/22 163 lb (73.9 kg)  10/04/21 165 lb (74.8 kg)   Temp Readings from Last 3 Encounters:  03/28/22 98.4 F (36.9 C) (Oral)  01/24/22 97.9 F (36.6 C) (Oral)  01/24/21 97.8 F (36.6 C) (Tympanic)   BP Readings from Last 3 Encounters:  03/28/22 Marland Kitchen)  127/48  01/24/22 (!) 156/60  10/04/21 (!) 139/57   Pulse Readings from Last 3 Encounters:  03/28/22 89  01/24/22 74  10/04/21 67    /10  In general this is a well appearing she in no acute  distress. She's alert and oriented x4 and appropriate throughout the examination. Cardiopulmonary assessment is negative for acute distress and she exhibits normal effort.     ECOG = 2  0 - Asymptomatic (Fully active, able to carry on all predisease activities without restriction)  1 - Symptomatic but completely ambulatory (Restricted in physically strenuous activity but ambulatory and able to carry out work of a light or sedentary nature. For example, light housework, office work)  2 - Symptomatic, <50% in bed during the day (Ambulatory and capable of all self care but unable to carry out any work activities. Up and about more than 50% of waking hours)  3 - Symptomatic, >50% in bed, but not bedbound (Capable of only limited self-care, confined to bed or chair 50% or more of waking hours)  4 - Bedbound (Completely disabled. Cannot carry on any self-care. Totally confined to bed or chair)  5 - Death   Eustace Pen MM, Creech RH, Tormey DC, et al. [phone]). "Toxicity and response criteria of the [facility] Group". [place] Oncol. 5 (6): 649-55    LABORATORY DATA:  Lab Results  Component Value Date   WBC 3.6 (L) 03/28/2022   HGB 7.7 (L) 03/28/2022   HCT 24.4 (L) 03/28/2022   MCV 107.0 (H) 03/28/2022   PLT 82 (L) 03/28/2022   Lab Results  Component Value Date   NA 136 03/28/2022   K 3.8 03/28/2022   CL 107 03/28/2022   CO2 17 (L) 03/28/2022   Lab Results  Component Value Date   ALT 37 03/28/2022   AST 144 (H) 03/28/2022   ALKPHOS 203 (H) 03/28/2022   BILITOT 1.6 (H) 03/28/2022      RADIOGRAPHY: VAS [place] LOWER EXTREMITY VENOUS (DVT)  Result Date: 03/26/2022  Lower Venous DVT Study Patient Name:  Catherine Decker  Date of Exam:   03/26/2022 Medical Rec #: [id number]        Accession #:    [id number] Date of Birth: [birth year]       Patient Gender: F Patient Age:   40 years Exam Location:  [facility] [address] Procedure:      VAS [place] LOWER EXTREMITY VENOUS (DVT)  Referring Phys: Annamaria Boots XU --------------------------------------------------------------------------------  Indications: Edema.  Limitations: Body habitus and poor ultrasound/tissue interface. Comparison Study: No previous exams Performing Technologist: Jody Hill RVT, RDMS  Examination Guidelines: A complete evaluation includes B-mode imaging, spectral Doppler, color Doppler, and power Doppler as needed of all accessible portions of each vessel. Bilateral testing is considered an integral part of a complete examination. Limited examinations for reoccurring indications may be performed as noted. The reflux portion of the exam is performed with the patient in reverse Trendelenburg.  +---------+---------------+---------+-----------+----------+-------------------+ RIGHT    CompressibilityPhasicitySpontaneityPropertiesThrombus Aging      +---------+---------------+---------+-----------+----------+-------------------+ CFV      Full           Yes      Yes                                      +---------+---------------+---------+-----------+----------+-------------------+ SFJ      Full                                                             +---------+---------------+---------+-----------+----------+-------------------+  FV Prox  Full           Yes      Yes                                      +---------+---------------+---------+-----------+----------+-------------------+ FV Mid   Full           Yes      Yes                                      +---------+---------------+---------+-----------+----------+-------------------+ FV DistalFull           Yes      Yes                                      +---------+---------------+---------+-----------+----------+-------------------+ PFV      Full                                                             +---------+---------------+---------+-----------+----------+-------------------+ POP      Full           Yes      Yes                                       +---------+---------------+---------+-----------+----------+-------------------+ PTV                                                   Not well visualized +---------+---------------+---------+-----------+----------+-------------------+ PERO                                                  Not well visualized +---------+---------------+---------+-----------+----------+-------------------+   +---------+---------------+---------+-----------+----------+--------------+ LEFT     CompressibilityPhasicitySpontaneityPropertiesThrombus Aging +---------+---------------+---------+-----------+----------+--------------+ CFV      Full           Yes      Yes                                 +---------+---------------+---------+-----------+----------+--------------+ SFJ      Full                                                        +---------+---------------+---------+-----------+----------+--------------+ FV Prox  Full           Yes      Yes                                 +---------+---------------+---------+-----------+----------+--------------+  FV Mid   Full           Yes      Yes                                 +---------+---------------+---------+-----------+----------+--------------+ FV DistalFull           Yes      Yes                                 +---------+---------------+---------+-----------+----------+--------------+ PFV      Full                                                        +---------+---------------+---------+-----------+----------+--------------+ POP      Full           Yes      Yes                                 +---------+---------------+---------+-----------+----------+--------------+ PTV      Full                                                        +---------+---------------+---------+-----------+----------+--------------+ PERO     Full                                                         +---------+---------------+---------+-----------+----------+--------------+     Summary: BILATERAL: - No evidence of deep vein thrombosis seen in the lower extremities, bilaterally. -No evidence of popliteal cyst, bilaterally. -Diffuse subcutaneous edema seen bilaterally.  *See table(s) above for measurements and observations. Electronically signed by Harold Barban MD on 03/26/2022 at 8:33:56 PM.    Final    DG CHEST PORT 1 VIEW  Result Date: 03/26/2022 CLINICAL DATA:  Shortness of breath EXAM: PORTABLE CHEST 1 VIEW COMPARISON:  Chest x-ray March 24, 2022 FINDINGS: The cardiomediastinal silhouette is unchanged in contour. Low lung volumes with bronchovascular crowding. The left costophrenic angle is excluded from the field of view. No focal pulmonary opacity. No pleural effusion or pneumothorax. The visualized upper abdomen is unremarkable. Redemonstrated multiple displaced bilateral rib fractures. IMPRESSION: No acute cardiopulmonary abnormality. Electronically Signed   By: Beryle Flock M.D.   On: 03/26/2022 08:18   CT BIOPSY  Result Date: 03/25/2022 INDICATION: 82 year old female with a history iliac bone lesion referred for biopsy EXAM: CT BIOPSY MEDICATIONS: None. ANESTHESIA/SEDATION: Moderate (conscious) sedation was employed during this procedure. A total of Versed 1.0 mg and Fentanyl 50 mcg was administered intravenously. Moderate Sedation Time: 11 minutes. The patient's level of consciousness and vital signs were monitored continuously by radiology nursing throughout the procedure under my direct supervision. FLUOROSCOPY TIME:  CT COMPLICATIONS: None PROCEDURE: Informed written consent was obtained from the patient after a thorough discussion of the procedural risks, benefits and alternatives.  All questions were addressed. Maximal Sterile Barrier Technique was utilized including caps, mask, sterile gowns, sterile gloves, sterile drape, hand hygiene and skin antiseptic. A timeout was  performed prior to the initiation of the procedure. Patient positioned supine on the CT gantry table. Scout CT acquired for planning purposes. The patient is prepped and draped in the usual sterile fashion. 1% lidocaine was used for local anesthesia. Using CT guidance, guide needle was advanced into the lytic lesion of the left iliac crest. Once we confirmed needle tip position, multiple 18 gauge core biopsy were acquired. Specimen placed into formalin. Patient tolerated the procedure well and remained hemodynamically stable throughout. No complications were encountered and no significant blood loss. IMPRESSION: Status post CT-guided biopsy of left iliac crest lytic lesion. Signed, Dulcy Fanny. Nadene Rubins, RPVI [facility] Radiology Electronically Signed   By: Corrie Mckusick D.O.   On: 03/25/2022 16:21   DG CHEST PORT 1 VIEW  Result Date: 03/24/2022 CLINICAL DATA:  Shortness of breath. EXAM: PORTABLE CHEST 1 VIEW COMPARISON:  03/22/2022 FINDINGS: Lungs are adequately inflated without focal airspace consolidation or effusion. Cardiomediastinal silhouette is normal. Surgical clips over the right axilla. Talus device overlies the right upper quadrant. There are multiple displaced left lateral rib fractures as well as displaced right lateral rib fractures unchanged. IMPRESSION: 1. No acute cardiopulmonary disease. 2. Multiple displaced bilateral rib fractures unchanged. Electronically Signed   By: Marin Olp M.D.   On: 03/24/2022 08:19   DG CHEST PORT 1 VIEW  Result Date: 03/22/2022 CLINICAL DATA:  Shortness of breath, cough. EXAM: PORTABLE CHEST 1 VIEW COMPARISON:  Chest x-rays dated 03/21/2022 and 03/20/2022 chest CT dated 03/14/2022. FINDINGS: Persistent airspace opacities at the bilateral lung bases, atelectasis and/or small pleural effusions, but improved aeration at the lung bases compared to the recent chest x-rays. No new lung findings. No pneumothorax  is seen. Heart size and mediastinal contours appear stable. IMPRESSION: Improved aeration at the bilateral lung bases. Persistent airspace opacities at the lung bases, likely decreased atelectasis and/or small pleural effusions. Electronically Signed   By: Franki Cabot M.D.   On: 03/22/2022 09:40   ECHOCARDIOGRAM COMPLETE  Result Date: 03/22/2022    ECHOCARDIOGRAM REPORT   Patient Name:   Catherine Decker Date of Exam: 03/22/2022 Medical Rec #:  [id number]       Height:       63.0 in Accession #:    [id number]      Weight:       171.3 lb Date of Birth:  05/03/1940      BSA:          1.810 m Patient Age:    25 years        BP:           135/58 mmHg Patient Gender: F               HR:           66 bpm. Exam Location:  Inpatient Procedure: 2D Echo, Cardiac Doppler and Color Doppler Indications:    Bacteremia  History:        Patient has no prior history of Echocardiogram examinations.                 Risk Factors:Diabetes, Hypertension and Dyslipidemia. FLU                 positive. Breast cancer 2021. SDH.  Sonographer:    Segundo  Referring Phys: [id number] [facility] LATIF [facility]  Sonographer Comments: Suboptimal parasternal window and suboptimal subcostal window. Could not turn due to multiple rib fractures. IMPRESSIONS  1. Left ventricular ejection fraction, by estimation, is 60 to 65%. The left ventricle has normal function. The left ventricle has no regional wall motion abnormalities. There is mild left ventricular hypertrophy. Left ventricular diastolic parameters are consistent with Grade I diastolic dysfunction (impaired relaxation). Elevated left ventricular end-diastolic pressure.  2. Right ventricular systolic function is normal. The right ventricular size is normal.  3. The mitral valve is abnormal. No evidence of mitral valve regurgitation. No evidence of mitral stenosis.  4. The aortic valve is tricuspid. Aortic valve regurgitation is not visualized. No aortic stenosis is present.  5. The inferior vena  cava is normal in size with greater than 50% respiratory variability, suggesting right atrial pressure of 3 mmHg. FINDINGS  Left Ventricle: Left ventricular ejection fraction, by estimation, is 60 to 65%. The left ventricle has normal function. The left ventricle has no regional wall motion abnormalities. The left ventricular internal cavity size was normal in size. There is  mild left ventricular hypertrophy. Left ventricular diastolic parameters are consistent with Grade I diastolic dysfunction (impaired relaxation). Elevated left ventricular end-diastolic pressure. Right Ventricle: The right ventricular size is normal. No increase in right ventricular wall thickness. Right ventricular systolic function is normal. Left Atrium: Left atrial size was normal in size. Right Atrium: Right atrial size was normal in size. Pericardium: There is no evidence of pericardial effusion. Mitral Valve: The mitral valve is abnormal. There is mild thickening of the mitral valve leaflet(s). Mild mitral annular calcification. No evidence of mitral valve regurgitation. No evidence of mitral valve stenosis. Tricuspid Valve: The tricuspid valve is normal in structure. Tricuspid valve regurgitation is mild . No evidence of tricuspid stenosis. Aortic Valve: The aortic valve is tricuspid. Aortic valve regurgitation is not visualized. No aortic stenosis is present. Pulmonic Valve: The pulmonic valve was normal in structure. Pulmonic valve regurgitation is mild. No evidence of pulmonic stenosis. Aorta: The aortic root is normal in size and structure. Venous: The inferior vena cava is normal in size with greater than 50% respiratory variability, suggesting right atrial pressure of 3 mmHg. IAS/Shunts: No atrial level shunt detected by color flow Doppler.  LEFT VENTRICLE PLAX 2D LVIDd:         4.30 cm   Diastology LVIDs:         2.25 cm   LV e' medial:    6.31 cm/s LV PW:         1.10 cm   LV E/e' medial:  14.5 LV IVS:        1.30 cm   LV e'  lateral:   6.31 cm/s LVOT diam:     2.30 cm   LV E/e' lateral: 14.5 LV SV:         125 LV SV Index:   69 LVOT Area:     4.15 cm  RIGHT VENTRICLE             IVC RV S prime:     12.20 cm/s  IVC diam: 1.50 cm TAPSE (M-mode): 2.2 cm LEFT ATRIUM             Index        RIGHT ATRIUM           Index LA diam:        3.40 cm 1.88 cm/m   RA Area:  11.10 cm LA Vol (A2C):   29.9 ml 16.52 ml/m  RA Volume:   19.60 ml  10.83 ml/m LA Vol (A4C):   24.2 ml 13.37 ml/m LA Biplane Vol: 26.2 ml 14.47 ml/m  AORTIC VALVE LVOT Vmax:   145.00 cm/s LVOT Vmean:  104.000 cm/s LVOT VTI:    0.302 m  AORTA Ao Root diam: 3.70 cm Ao Asc diam:  3.30 cm MITRAL VALVE MV Area (PHT): 3.42 cm    SHUNTS MV Decel Time: 222 msec    Systemic VTI:  0.30 m MV E velocity: 91.80 cm/s  Systemic Diam: 2.30 cm MV A velocity: 95.50 cm/s MV E/A ratio:  0.96 Jenkins Rouge MD Electronically signed by Jenkins Rouge MD Signature Date/Time: 03/22/2022/9:30:38 AM    Final    DG CHEST PORT 1 VIEW  Result Date: 03/21/2022 CLINICAL DATA:  Shortness of breath EXAM: PORTABLE CHEST 1 VIEW COMPARISON:  Chest radiograph dated 03/20/2022 FINDINGS: Low lung volumes with similar bibasilar and right lower linear opacities. Similar small bilateral pleural effusions. No pneumothorax. Similar cardiomediastinal silhouette. The visualized skeletal structures are unremarkable. IMPRESSION: 1. Similar small bilateral pleural effusions. 2. Low lung volumes with similar bibasilar and right lower linear opacities, likely atelectasis. Electronically Signed   By: Darrin Nipper M.D.   On: 03/21/2022 08:36   DG CHEST PORT 1 VIEW  Result Date: 03/20/2022 CLINICAL DATA:  [id number] with shortness of breath. EXAM: PORTABLE CHEST 1 VIEW COMPARISON:  Portable chest and chest CT without contrast both 03/14/2022 FINDINGS: 4:57 a.m. The heart has mildly enlarged in the interval. There is now mild perihilar vascular congestion and lower zonal interstitial edema consistent with CHF or fluid overload.  Small pleural effusions have also increased in size, with increasing overlying linear and hazy opacities consistent with atelectasis or pneumonia versus ground-glass edema. The mid and upper lung fields are clear of focal opacities. There are right axillary surgical clips. The mediastinum is normally outlined. There is calcification of the transverse aorta. Degenerative change thoracic spine. IMPRESSION: 1. Mild cardiomegaly with perihilar vascular congestion and lower zonal interstitial edema consistent with CHF or fluid overload. 2. Small pleural effusions have increased in size, with increasing overlying linear and hazy opacities consistent with atelectasis or pneumonia versus ground-glass edema. 3. Clinical correlation and radiographic follow-up recommended. Electronically Signed   By: Telford Nab M.D.   On: 03/20/2022 07:47   US Abdomen Limited RUQ (LIVER/GB)  Result Date: 03/15/2022 CLINICAL DATA:  Elevated liver function tests EXAM: ULTRASOUND ABDOMEN LIMITED RIGHT UPPER QUADRANT COMPARISON:  Abdominal CT from yesterday FINDINGS: Gallbladder: History of cholecystectomy Common bile duct: Diameter: 5 mm Liver: Heterogeneous parenchyma with lobulation from known cirrhosis. There is history of breast cancer but constellation of findings suggest long term cirrhosis rather than pseudocirrhosis. The low-density area highlighted on prior study is an aneurysmal vessel there is contiguous with the portal system on clips, up to 3.6 cm. No gross solid mass lesion. No portal venous occlusion or reversal noted. IMPRESSION: Cirrhosis with portal hypertension. The low-density structure in the left lobe liver highlighted on prior noncontrast CT is a portal varix. Electronically Signed   By: Jorje Guild M.D.   On: 03/15/2022 08:00   US RENAL  Result Date: 03/14/2022 CLINICAL DATA:  AKI EXAM: RENAL / URINARY TRACT ULTRASOUND COMPLETE COMPARISON:  CT chest abdomen and pelvis March 14, 2022 FINDINGS: Right Kidney:  Renal measurements: 9.3 x 4.8 x 4.3 cm = volume: 98.8 mL. Renal cortical thinning. Echogenicity within normal limits. No  mass or hydronephrosis visualized. Left Kidney: Renal measurements: 9.1 x 4.0 x 3.8 cm = volume: 73.1 mL. Renal cortical thinning. Echogenicity within normal limits. No mass or hydronephrosis visualized. Bladder: Appears normal for degree of bladder distention. Other: None. IMPRESSION: 1. Bilateral renal cortical thinning, which can be seen in the setting of medical renal disease. 2. No hydronephrosis. Electronically Signed   By: Beryle Flock M.D.   On: 03/14/2022 18:35   CT CHEST ABDOMEN PELVIS WO CONTRAST  Result Date: 03/14/2022 CLINICAL DATA:  Fall yesterday. Rib pain. Prior mastectomy for right breast cancer. Known cirrhosis. * Tracking Code: BO * EXAM: CT CHEST, ABDOMEN AND PELVIS WITHOUT CONTRAST TECHNIQUE: Multidetector CT imaging of the chest, abdomen and pelvis was performed following the standard protocol without IV contrast. RADIATION DOSE REDUCTION: This exam was performed according to the departmental dose-optimization program which includes automated exposure control, adjustment of the mA and/or kV according to patient size and/or use of iterative reconstruction technique. COMPARISON:  02/28/2020 FINDINGS: CT CHEST FINDINGS Cardiovascular: Mild degradation secondary to arm position, not raised above the head. Lack of IV contrast. Aortic atherosclerosis. Tortuous thoracic aorta. Normal heart size, without pericardial effusion. Lad coronary artery calcification. Mediastinum/Nodes: Right axillary node dissection. No axillary or subpectoral adenopathy. Calcified mediastinal and right hilar nodes are likely related to old granulomatous disease. No mediastinal or hilar adenopathy, given limitations of unenhanced CT. Lungs/Pleura: No pleural fluid.  Mild left base scarring. Inferior right middle lobe and dependent right lower lobe areas of consolidation. Peribronchovascular  nodularity within the more central right middle and right lower lobes. Example 9 mm in the right lower lobe on 81/5. These areas are all new since 02/28/2020. No pneumothorax. Musculoskeletal: Right mastectomy. Osteopenia. Minimally displaced fourth and fifth lateral left rib fractures including on 17 and 21 of series 3 are acute. The seventh lateral right rib fracture is new in the interval but partially healed. An eighth posterolateral right rib fracture is displaced, of indeterminate acuity but unhealed on 39/3. Mild superior endplate compression deformity at T4 is new in the interval. A moderate T7 compression deformity is without ventral canal encroachment and demonstrates heterogeneous increased density within. Heterogeneous increased density within the right acromion on 05/03. CT ABDOMEN PELVIS FINDINGS Hepatobiliary: Advanced cirrhosis. Left hepatic lobe hypoattenuation centrally could represent an enlarged left portal vein when correlated with prior contrast enhanced exam but is indeterminate on 55/3. Cholecystectomy, without biliary ductal dilatation. Pancreas: Normal, without mass or ductal dilatation. Spleen: Splenomegaly at 13.9 cm craniocaudal. Old granulomatous disease within. Adrenals/Urinary Tract: Normal adrenal glands. Mild renal cortical thinning bilaterally. No hydronephrosis. Normal urinary bladder. Stomach/Bowel: Proximal gastric underdistention. Extensive colonic diverticulosis. Sigmoid wall thickening is typical of muscular hypertrophy. Normal terminal ileum and appendix. Normal small bowel. Vascular/Lymphatic: Advanced aortic and branch vessel atherosclerosis. Splenic artery aneurysm of 1.2 cm is not significantly changed. Portal venous hypertension, with a recanalized paraumbilical vein and abdominal wall collaterals. No abdominopelvic adenopathy. Reproductive: Hysterectomy.  No adnexal mass. Other: Small volume abdominopelvic fluid, new. No free intraperitoneal air. Musculoskeletal:  Osteopenia. Left iliac lytic lesion of 2.6 cm on 88/3, new. A mild superior endplate compression deformity at L3 is of indeterminate acuity. IMPRESSION: 1. Low sensitivity exam secondary to lack of oral or IV contrast and patient arm position, not raised above the head. 2. Osseous metastasis, most apparent as a lytic lesion in the left iliac wing. Suspicion of right acromion process metastasis as well. 3. Bilateral rib fractures, acute on the left and of indeterminate acuity  on the right. 4. Thoracolumbar compression deformities, new since 02/27/2021,. Of indeterminate acuity. Cannot exclude pathologic fracture, including at T7. Consider nonemergent pre and postcontrast thoracic and possibly lumbar spine MRI. 5. Right lower and right middle lobe consolidation, favoring pneumonia. Concurrent peribronchovascular nodularity which is also most likely infectious. Somewhat atypical appearance of pulmonary metastasis or primary bronchogenic carcinoma felt less likely. Recommend follow-up on chest CT at 6-8 weeks. 6. Cirrhosis and portal venous hypertension with new small volume abdominopelvic ascites. Hypoattenuation in the central left lower lobe could represent an enlarged left portal vein but is indeterminate on this noncontrast exam. This could be further evaluated with nonemergent pre and post contrast abdominal MRI. Alternatively, if the patient is eventually scheduled for PET, this would likely be informative. 7. Similar 12 mm splenic artery aneurysm. 8. Incidental findings, including: Coronary artery atherosclerosis. Aortic Atherosclerosis (ICD10-I70.0). Electronically Signed   By: Abigail Miyamoto M.D.   On: 03/14/2022 14:49   CT Head Wo Contrast  Result Date: 03/14/2022 CLINICAL DATA:  Status post fall EXAM: CT HEAD WITHOUT CONTRAST CT CERVICAL SPINE WITHOUT CONTRAST TECHNIQUE: Multidetector CT imaging of the head and cervical spine was performed following the standard protocol without intravenous contrast.  Multiplanar CT image reconstructions of the cervical spine were also generated. RADIATION DOSE REDUCTION: This exam was performed according to the departmental dose-optimization program which includes automated exposure control, adjustment of the mA and/or kV according to patient size and/or use of iterative reconstruction technique. COMPARISON:  None Available. FINDINGS: CT HEAD FINDINGS Brain: Bilateral low-attenuation sub dural fluid collections overlie the cerebral image spheres compatible with chronic subdural hygromas. Overlying the right frontoparietal convexity there is a 4 mm intermediate density subpleural fluid subdural fluid collection which measures Hounsfield units between 50 and 60, image 28/6 and image 26/5. No definite hyperdense subdural fluid collections identified. There is no significant midline shift or mass effect. No signs of acute brain infarct. There is mild diffuse low-attenuation within the subcortical and periventricular white matter compatible with chronic microvascular disease. Vascular: No hyperdense vessel or unexpected calcification. Skull: Normal. Negative for fracture or focal lesion. Sinuses/Orbits: No acute finding. Other: None CT CERVICAL SPINE FINDINGS Alignment: Normal. Skull base and vertebrae: There is an acute mildly displaced fracture involving the posterior spinous process T1, image 71/5. Lucent lesion is identified within the posterior aspect of the T1 vertebral body measuring 1.4 cm, image 73/5. Soft tissues and spinal canal: No prevertebral fluid or swelling. No visible canal hematoma. Disc levels: Multilevel disc space narrowing and endplate spurring noted within the cervical spine. Upper chest: No pneumothorax identified within the lung apices. Permeative appearance of the right coracoid process of the right scapula, image 79/5. Other: None IMPRESSION: 1. Bilateral low-attenuation sub dural fluid collections overlie the cerebral image spheres compatible with chronic  subdural hygromas. 2. Overlying the right frontoparietal convexity there is a 4 mm intermediate density subpleural fluid collection which measures Hounsfield units between 50 and 60. This is favored to represent a subacute subdural hematoma. 3. Acute mildly displaced fracture involving the posterior spinous process of T1. 4. Lucent lesion is identified within the posterior aspect of the T1 vertebral body measuring 1.4 cm. Permeative appearance of the right coracoid process of the right scapula. Findings are concerning for metastatic disease. 5. Chronic small vessel ischemic disease and brain atrophy. Critical Value/emergent results were called by telephone at the time of interpretation on 03/14/2022 at 2:43 pm to provider [facility] , who verbally acknowledged these results. Electronically Signed  By: Kerby Moors M.D.   On: 03/14/2022 14:44   CT Cervical Spine Wo Contrast  Result Date: 03/14/2022 CLINICAL DATA:  Status post fall EXAM: CT HEAD WITHOUT CONTRAST CT CERVICAL SPINE WITHOUT CONTRAST TECHNIQUE: Multidetector CT imaging of the head and cervical spine was performed following the standard protocol without intravenous contrast. Multiplanar CT image reconstructions of the cervical spine were also generated. RADIATION DOSE REDUCTION: This exam was performed according to the departmental dose-optimization program which includes automated exposure control, adjustment of the mA and/or kV according to patient size and/or use of iterative reconstruction technique. COMPARISON:  None Available. FINDINGS: CT HEAD FINDINGS Brain: Bilateral low-attenuation sub dural fluid collections overlie the cerebral image spheres compatible with chronic subdural hygromas. Overlying the right frontoparietal convexity there is a 4 mm intermediate density subpleural fluid subdural fluid collection which measures Hounsfield units between 50 and 60, image 28/6 and image 26/5. No definite hyperdense subdural fluid collections  identified. There is no significant midline shift or mass effect. No signs of acute brain infarct. There is mild diffuse low-attenuation within the subcortical and periventricular white matter compatible with chronic microvascular disease. Vascular: No hyperdense vessel or unexpected calcification. Skull: Normal. Negative for fracture or focal lesion. Sinuses/Orbits: No acute finding. Other: None CT CERVICAL SPINE FINDINGS Alignment: Normal. Skull base and vertebrae: There is an acute mildly displaced fracture involving the posterior spinous process T1, image 71/5. Lucent lesion is identified within the posterior aspect of the T1 vertebral body measuring 1.4 cm, image 73/5. Soft tissues and spinal canal: No prevertebral fluid or swelling. No visible canal hematoma. Disc levels: Multilevel disc space narrowing and endplate spurring noted within the cervical spine. Upper chest: No pneumothorax identified within the lung apices. Permeative appearance of the right coracoid process of the right scapula, image 79/5. Other: None IMPRESSION: 1. Bilateral low-attenuation sub dural fluid collections overlie the cerebral image spheres compatible with chronic subdural hygromas. 2. Overlying the right frontoparietal convexity there is a 4 mm intermediate density subpleural fluid collection which measures Hounsfield units between 50 and 60. This is favored to represent a subacute subdural hematoma. 3. Acute mildly displaced fracture involving the posterior spinous process of T1. 4. Lucent lesion is identified within the posterior aspect of the T1 vertebral body measuring 1.4 cm. Permeative appearance of the right coracoid process of the right scapula. Findings are concerning for metastatic disease. 5. Chronic small vessel ischemic disease and brain atrophy. Critical Value/emergent results were called by telephone at the time of interpretation on 03/14/2022 at 2:43 pm to provider [facility] , who verbally acknowledged these  results. Electronically Signed   By: Kerby Moors M.D.   On: 03/14/2022 14:44   DG Chest 2 View  Result Date: 03/14/2022 CLINICAL DATA:  Fall, rib pain, cough, and shortness of breath. EXAM: CHEST - 2 VIEW COMPARISON:  Chest radiographs 08/07/2009 and CT 02/28/2020 FINDINGS: The cardiomediastinal silhouette is unchanged with normal heart size. The lungs are hyperinflated. New patchy opacity is present in the medial right lung base. The left lung is clear. There could be trace pleural effusions. No pneumothorax is identified. There is a mildly displaced posterolateral right eighth rib fracture, and a non or minimally displaced seventh rib fracture is also suspected. Surgical clips are noted in the right axilla. There is a midthoracic vertebral compression fracture with moderate vertebral body height loss which is new from 2021 and of indeterminate age. IMPRESSION: 1. Right seventh and eighth rib fractures. No pneumothorax. 2. New medial right  basilar airspace opacity suspicious for pneumonia. Followup PA and lateral chest radiographs are recommended in 3-4 weeks following trial of antibiotic therapy to ensure resolution and exclude underlying malignancy. 3. Possible trace pleural effusions. 4. Age-indeterminate midthoracic vertebral compression fracture. Electronically Signed   By: Logan Bores M.D.   On: 03/14/2022 12:02       IMPRESSION/PLAN: 1. Metastatic bone lesions of undetermined primary.   Catherine Decker is an 82 year old female with medical history significant for cirrhosis and recurrent breast cancer who presents for a fall from standing with subsequent subdural hematoma.   CT findings are concerning for metastatic lesions from recurrent breast cancer. They may represent a new primary, however imaging did not show evidence of a new primary lesion. Further staining from IR biopsy of left iliac bone will help determine the origin. Dr. Lorenso Decker ordered serological studies to assess for other  possible causes as well.   Patient is not experiencing significant pain explained by the left iliac lesion visualized on CT. The majority of her pain is in her lower back which is not typical with a left iliac bone lesion. MRI of thoracic and lumbar scan ordered to assess for other possible causes of lower back pain. Patient expressed understanding of the plan going forward.   In a visit lasting 60 minutes, greater than 50% of the time was spent face to face discussing the patient's condition, in preparation for the discussion, and coordinating the patient's care.   The above documentation reflects my direct findings during this shared patient visit. Please see the separate note by Dr. Lisbeth Renshaw on this date for the remainder of the patient's plan of care.     **Disclaimer: This note was dictated with voice recognition software. Similar sounding words can inadvertently be transcribed and this note may contain transcription errors which may not have been corrected upon publication of note.**

## 2022-03-28 NOTE — Progress Notes (Signed)
Mobility Specialist - Progress Note   03/28/22 0917  Mobility  Activity Ambulated with assistance in hallway  Level of Assistance Minimal assist, patient does 75% or more  Assistive Device Front wheel walker  Distance Ambulated (ft) 80 ft  Range of Motion/Exercises Active  Activity Response Tolerated well  Mobility Referral Yes  $Mobility charge 1 Mobility   Pt was found on recliner chair and agreeable to ambulate. Had x3 seated rest breaks during session at ~78f ea due to fatigue. At EOS returned to recliner chair with all necessities in reach.  BFerd HibbsMobility Specialist

## 2022-03-28 NOTE — TOC Progression Note (Signed)
Transition of Care [facility]) - Progression Note    Patient Details  Name: KORTNIE STOVALL MRN: [id number] Date of Birth: [birth year]  Transition of Care [facility]) CM/SW [place], LCSW Phone Number: 03/28/2022, 12:30 PM  Clinical Narrative:    CSW messaged MD to inquire if pt is close to being stable for d/c. Per MD, "not able to discharge, still very volume overloaded, please get new auth, thx " CSW will start insurance auth as pt's insurance is [employer], which takes a while. TOC to follow  auth started, ref# [id number]      Expected Discharge Plan: [place] Barriers to Discharge: Continued Medical Work up  Expected Discharge Plan and Services       Living arrangements for the past 2 months: Single Family Home                                       Social Determinants of Health (SDOH) Interventions SDOH Screenings   Food Insecurity: No Food Insecurity (03/14/2022)  Housing: Low Risk  (03/14/2022)  Transportation Needs: No Transportation Needs (03/14/2022)  Utilities: Not At Risk (03/14/2022)  Tobacco Use: Low Risk  (03/14/2022)    Readmission Risk Interventions     No data to display

## 2022-03-28 NOTE — Progress Notes (Signed)
[employer] auth pending as of 4:37pm 08/27/99 [employer] precert phone number # [phone]  Ref # for Josem Kaufmann is [id number] NPI #[id number]

## 2022-03-28 NOTE — Progress Notes (Signed)
Mobility Specialist - Progress Note   03/28/22 1320  Mobility  Activity Transferred to/from [facility]  Level of Assistance Minimal assist, patient does 75% or more  Assistive Device Front wheel walker  Activity Response Tolerated well  Mobility Referral Yes  $Mobility charge 1 Mobility   Pt was found in bed and requiring assistance to [facility]. No complaints and returned to bed with all necessities in reach.  Ferd Hibbs Mobility Specialist

## 2022-03-28 NOTE — Progress Notes (Signed)
PROGRESS NOTE    Catherine Decker  YWV:[id number] DOB: Jul 14, 1940 DOA: 03/14/2022 PCP: Antony Contras, MD   Brief Narrative:  The Patient is an obese 82 year old female history of hypothyroidism, hypertension, as well as other comorbidities who presented to the ED after she fell down the stairs 2 days prior to admission slipped while walking hit her head on the way down but did not suffer any LOC. Patient was able to get up with help from a friend however decided to stay home. Patient's daughter noted that patient had more confusion and fatigue over the past 2 days prior to admission with decreased appetite and a 1-1/2-week history of cough and congestion and subsequently brought to the ED as patient with no clinical improvement.    Patient was seen in the ED, workup done concerning for subdural hematoma, chronic hygromas, multiple rib fractures, pneumonia, influenza A, AKI on CKD stage IIIb. Neurosurgery reviewed films and felt no further workup needed intervention at this time. Trauma surgery also assessed the patient and recommended pain management, pulmonary toileting. Patient admitted placed on empiric IV antibiotics and Tamiflu.  She is subsequently improving but renal function continues to be elevated and now slowly improving.  Will need to ensure adequate pain control.  PT OT still recommending SNF and will discharge when she is little bit more improved from a lab perspective as she is agreeable to SNF.   Legs remain swollen she is going to get another dose of IV Lasix but will need to watch her renal function carefully.   Discussed with Dr. Lorenso Courier yesterday in the morning given that the CT scans that she had last week showed multiple metastatic lesions and he recommended further workup and obtaining a CT-guided biopsy of the left iliac bone marrow biopsy.  She still is a little volume overloaded so we will give her another dose of IV Lasix  Assessment and Plan:  Fall with  multiple rib  fractures Spinous process fracture of T1 Subdural hematoma/chronic hygromas -Secondary to mechanical fall , reports She slipped while walking down stairs -Patient assessed by the trauma team who  recommended conservative management with pain control, pulmonary toileting, incentive spirometry use secondary to rib fractures to prevent worsening pneumonia and no further intervention at this time. --Films reviewed by neurosurgery who feel no intervention needed at this time. --PT/OT  -go to SNF when she is diuresed and volume status improves   Influenza A/lobar pneumonia (RML and RLL)/lactic acidosis on presentation  -Urine strep pneumo antigen positive, treated with 5 days of Rocephin and azithromycin -Initiated Tamiflu and completed 5 Days -still has intermittent cough, but no hypoxia, no sob, no fever -repeat cxr on 1/17 am showed "Low lung volumes with bronchovascular crowding" but no reports of infiltrates      Volume Overload bilateral pleural effusions , bilateral lower extremity edema /acute on Chronic Diastolic CHF, she also has hypoalbuminemia --BNP was 40.5 but not accurate as patient is obese -Echocardiogram done and showed "lvef 60-65%, mild left ventricular hypertrophy.  Grade I diastolic dysfunction (impaired relaxation). Elevated left ventricular end-diastolic pressure. " -- Venous Doppler no DVT -Remain volume overloaded, continue higher dose of  IV Lasix '80mg'$   with albumin, TED hose placed  Hyponatremia -Likely due to volume overload, continue Lasix , monitor BMP  hypokalemia Replaced and improved, continue monitor while on Lasix  Abnormal LFTs/Transaminitis/ history of cirrhosis. -Right upper quadrant ultrasound consistent with cirrhosis with portal hypertension, low-density structure in the left lobe of the liver highlighted  on prior noncontrast CTs up oropharynx. -Could be from liver congestion volume overload/continue Lasix   AKI on CKD stage IIIb Metabolic  Acidosis, received bicarb -holding  ARB. -Renal ultrasound negative for hydronephrosis. -Urinalysis with concerns for UTI, urine culture no growth, received abx for PNA -possible component of cardiorenal ? Continue diuresis, monitor cr     Hyperlipidemia -Continue to hold home regimen for now    Hypothyroidism -Continue home regimen of Levothyroxine 112 mcg po Daily.   history of Breast Cancer Status postmastectomy/spinal lesions and now Left Iliac Wing Lytic Lesion -Imaging with T1 a right scapular lesion concerning for Metastasis. -CT Chest/Abd/Pelvs done and showed "Osseous metastasis, most apparent as a lytic lesion in the left iliac wing. Suspicion of right acromion process metastasis as well.  -Continue home regimen with Letrozole 2.5 mg po Daily.    -s/p  CT  guided biopsy of the lytic bone lesions + metastatic carcinoma, additional testing in process, oncology recommend rad onc consult  Oncology also ordered following test:  -multiple myeloma panel in process - kappa and lambda light chains elevated but with normal ratio (possible related to ckd) - LDH elevated at  256 -Appreciate oncology  and rad onc input, will follow recommendation       Pancytopenia -Patient with a pancytopenia being followed by hematology/oncology in the outpatient setting. -Patient with no S/Sx of bleeding, stool is brown ---oncology is checking multiple myeloma panel; - anemia panel showed  an iron level of 58, UIBC 146, TIBC of 204, saturation ratios of 28%, ferritin level 303, folate level 11.9, vitamin B12 2501, elevated retic count  -.Hgb/Hct is relatively stable and Plt Count and WBC count improving , follow heme-onc recommendation      Diabetes Mellitus Type 2, non-insulin-dependent -Hemoglobin A1c 5.0 (11/05/2020). -CBG's ranging from 80-138 -Hemoglobin A1c 4.9.   -discontinue home oral meds actos ( can also cause edema) -Discontinued SSI, CBGs.  -Outpatient follow-up.       Hypertension -BP stable and had been somewhat soft. -Hold home medication losartan -Getting IV albumin with iv lasix   Pressure injury, stage I medial buttocks, POA Pressure Injury 03/14/22 Buttocks Medial;Mid Stage 1 -  Intact skin with non-blanchable redness of a localized area usually over a bony prominence. (Active)  03/14/22 2200  Location: Buttocks  Location Orientation: Medial;Mid  Staging: Stage 1 -  Intact skin with non-blanchable redness of a localized area usually over a bony prominence.  Wound Description (Comments):   Present on Admission: Yes       Obesity -Complicates overall prognosis and care -Estimated body mass index is 31.16 kg/m as calculated from the following:   Height as of this encounter: '5\' 3"'$  (1.6 m).   Weight as of this encounter: 79.8 kg.  -Weight Loss and Dietary Counseling given  DVT prophylaxis: Place TED hose Start: 03/26/22 1847 SCDs Start: 03/14/22 2212 Place and maintain sequential compression device Start: 03/14/22 1706    Code Status: DNR Family Communication: daughter over the phone on 1/18  Disposition Plan:  Level of care: Progressive Remains inpatient appropriate because: Needs iv diuresis, remains significantly volume overloaded, SNF once volume improves Needs outpatient follow up with oncology   Consultants:  Medical oncology General Surgery/Trauma Team Dr. Bobbye Morton Neurosurgery  Procedures:  CT head CT C-spine 03/14/2022 Chest x-ray 03/14/2022 Renal ultrasound 03/14/2022 Right upper quadrant ultrasound 03/15/2022 CT chest abdomen and pelvis 03/14/2022 Lytic iliac Wing Bone Marrow Bx 03/25/2022  Antimicrobials:  Anti-infectives (From admission, onward)    Start  Dose/Rate Route Frequency Ordered Stop   03/15/22 1600  azithromycin (ZITHROMAX) 500 mg in sodium chloride 0.9 % 250 mL IVPB  Status:  Discontinued        500 mg 250 mL/hr over 60 Minutes Intravenous Every 24 hours 03/14/22 2211 03/17/22 1759   03/15/22 1000   cefTRIAXone (ROCEPHIN) 2 g in sodium chloride 0.9 % 100 mL IVPB        2 g 200 mL/hr over 30 Minutes Intravenous Every 24 hours 03/14/22 2211 03/20/22 1520   03/15/22 1000  oseltamivir (TAMIFLU) capsule 30 mg        30 mg Oral Daily 03/15/22 0910 03/19/22 1200   03/14/22 1530  cefTRIAXone (ROCEPHIN) 1 g in sodium chloride 0.9 % 100 mL IVPB        1 g 200 mL/hr over 30 Minutes Intravenous  Once 03/14/22 1515 03/14/22 1606   03/14/22 1530  azithromycin (ZITHROMAX) 500 mg in sodium chloride 0.9 % 250 mL IVPB        500 mg 250 mL/hr over 60 Minutes Intravenous  Once 03/14/22 1515 03/14/22 1717       Subjective: She appear weak but oriented x 3, currently denies pain I did not hear her cough today during conversation, there is no hypoxia, no fever Reports some improvement of   bilateral lower extremity pitting edema.    Objective: Vitals:   03/27/22 2018 03/28/22 0500 03/28/22 0524 03/28/22 1329  BP: (!) 123/53  (!) 128/58 (!) 127/48  Pulse: 88  92 89  Resp: '20  20 18  '$ Temp: 97.6 F (36.4 C)  98.7 F (37.1 C) 98.4 F (36.9 C)  TempSrc: Oral  Oral Oral  SpO2: 98%  98% 99%  Weight:  79.8 kg    Height:        Intake/Output Summary (Last 24 hours) at 03/28/2022 1609 Last data filed at 03/28/2022 1436 Gross per 24 hour  Intake 413.22 ml  Output 690 ml  Net -276.78 ml    Filed Weights   03/26/22 0518 03/27/22 0522 03/28/22 0500  Weight: 78.9 kg 81.3 kg 79.8 kg   Examination: Physical Exam:  Constitutional: WN/WD obese Caucasian female currently no acute distress Respiratory: Diminished to auscultation bilaterally, no wheezing, rales, rhonchi or crackles. Normal respiratory effort and patient is not tachypenic. No accessory muscle use.  Unlabored breathing Cardiovascular: RRR, no murmurs / rubs / gallops. S1 and S2 auscultated.  Has 1+ lower extremity bilateral pitting edema Abdomen: Soft, non-tender, distended secondary to body habitus bowel sounds positive.  GU:  Deferred. Musculoskeletal: + pitting edema bilateral lower extremities. Skin: No rashes, lesions, ulcers on limited skin evaluation. No induration; Warm and dry.  Neurologic: CN 2-12 grossly intact with no focal deficits. Romberg sign and cerebellar reflexes not assessed.  Psychiatric: Normal judgment and insight. Alert and oriented x 3. Normal mood and appropriate affect.   Data Reviewed: I have personally reviewed following labs and imaging studies  CBC: Recent Labs  Lab 03/22/22 0518 03/23/22 1028 03/24/22 0425 03/25/22 1019 03/26/22 0413 03/28/22 0406  WBC 5.6 6.2 5.1 7.3 5.7 3.6*  NEUTROABS 4.3 5.1 4.1 5.7 4.6  --   HGB 8.2* 8.7* 8.3* 9.0* 8.4* 7.7*  HCT 25.2* 27.7* 25.9* 27.8* 26.1* 24.4*  MCV 102.9* 107.4* 104.9* 104.5* 103.2* 107.0*  PLT 109* 106* 95* 121* 101* 82*   Basic Metabolic Panel: Recent Labs  Lab 03/22/22 0518 03/23/22 1028 03/24/22 0425 03/25/22 1019 03/26/22 0413 03/27/22 1002 03/28/22 0406  NA 133* 131*  131* 129* 132* 134* 136  K 3.4* 4.5 3.8 3.8 3.6 3.5 3.8  CL 107 105 104 104 105 108 107  CO2 18* 19* 20* 18* 19* 18* 17*  GLUCOSE 90 147* 86 85 87 103* 91  BUN 39* 46* 43* 44* 45* 45* 43*  CREATININE 1.95* 2.13* 2.07* 2.06* 2.03* 2.07* 1.86*  CALCIUM 8.4* 8.2* 8.1* 7.9* 8.0* 7.9* 8.1*  MG 1.9 2.1 2.0 1.9 1.9 1.8  --   PHOS 3.6 3.4 3.7 4.1 4.0  --   --    GFR: Estimated Creatinine Clearance: 24.1 mL/min (A) (by C-G formula based on SCr of 1.86 mg/dL (H)). Liver Function Tests: Recent Labs  Lab 03/23/22 1028 03/24/22 0425 03/25/22 1019 03/26/22 0413 03/28/22 0406  AST 162* 135* 147* 156* 144*  ALT 41 37 41 39 37  ALKPHOS 177* 162* 213* 187* 203*  BILITOT 1.8* 1.9* 1.9* 1.8* 1.6*  PROT 5.2* 4.9* 6.0* 5.1* 5.5*  ALBUMIN 2.2* 2.0* 2.3* 2.1* 2.3*   No results for input(s): "LIPASE", "AMYLASE" in the last 168 hours. No results for input(s): "AMMONIA" in the last 168 hours. Coagulation Profile: Recent Labs  Lab 03/25/22 0409  INR 1.4*    Cardiac Enzymes: No results for input(s): "CKTOTAL", "CKMB", "CKMBINDEX", "TROPONINI" in the last 168 hours. BNP (last 3 results) No results for input(s): "PROBNP" in the last 8760 hours. HbA1C: No results for input(s): "HGBA1C" in the last 72 hours. CBG: No results for input(s): "GLUCAP" in the last 168 hours. Lipid Profile: No results for input(s): "CHOL", "HDL", "LDLCALC", "TRIG", "CHOLHDL", "LDLDIRECT" in the last 72 hours. Thyroid Function Tests: No results for input(s): "TSH", "T4TOTAL", "FREET4", "T3FREE", "THYROIDAB" in the last 72 hours. Anemia Panel: No results for input(s): "VITAMINB12", "FOLATE", "FERRITIN", "TIBC", "IRON", "RETICCTPCT" in the last 72 hours.  Sepsis Labs: Recent Labs  Lab 03/27/22 0407  LATICACIDVEN 2.4*    No results found for this or any previous visit (from the past 240 hour(s)).   Radiology Studies: No results found.  Scheduled Meds:  calcium carbonate  1,250 mg Oral Q breakfast   cholecalciferol  1,000 Units Oral Daily   cyanocobalamin  1,000 mcg Oral Daily   feeding supplement (GLUCERNA SHAKE)  237 mL Oral TID BM   fluticasone  2 spray Each Nare Daily   folic acid  1 mg Oral Daily   furosemide  80 mg Intravenous Daily   guaiFENesin  1,200 mg Oral BID   hydrocortisone   Rectal TID   letrozole  2.5 mg Oral QODAY   levothyroxine  112 mcg Oral Q0600   loratadine  10 mg Oral Daily   methocarbamol  1,000 mg Oral TID   pantoprazole  40 mg Oral Daily   potassium chloride  40 mEq Oral Daily   senna-docusate  1 tablet Oral BID   sodium bicarbonate  650 mg Oral BID   Continuous Infusions:  albumin human 12.5 g (03/28/22 1035)     LOS: 14 days   Florencia Reasons, MD PhD FACP [facility] Available via Epic secure chat 7am-7pm After these hours, please refer to coverage provider listed on [email or web address] 03/28/2022, 4:09 PM

## 2022-03-29 DIAGNOSIS — R627 Adult failure to thrive: Secondary | ICD-10-CM | POA: Diagnosis not present

## 2022-03-29 DIAGNOSIS — J09X1 Influenza due to identified novel influenza A virus with pneumonia: Secondary | ICD-10-CM | POA: Diagnosis not present

## 2022-03-29 DIAGNOSIS — J181 Lobar pneumonia, unspecified organism: Secondary | ICD-10-CM | POA: Diagnosis not present

## 2022-03-29 DIAGNOSIS — I5033 Acute on chronic diastolic (congestive) heart failure: Secondary | ICD-10-CM | POA: Diagnosis not present

## 2022-03-29 LAB — CBC WITH DIFFERENTIAL/PLATELET
Abs Immature Granulocytes: 0.03 10*3/uL (ref 0.00–0.07)
Basophils Absolute: 0 10*3/uL (ref 0.0–0.1)
Basophils Relative: 1 %
Eosinophils Absolute: 0.1 10*3/uL (ref 0.0–0.5)
Eosinophils Relative: 2 %
HCT: 22.2 % — ABNORMAL LOW (ref 36.0–46.0)
Hemoglobin: 7.2 g/dL — ABNORMAL LOW (ref 12.0–15.0)
Immature Granulocytes: 1 %
Lymphocytes Relative: 15 %
Lymphs Abs: 0.4 10*3/uL — ABNORMAL LOW (ref 0.7–4.0)
MCH: 34.8 pg — ABNORMAL HIGH (ref 26.0–34.0)
MCHC: 32.4 g/dL (ref 30.0–36.0)
MCV: 107.2 fL — ABNORMAL HIGH (ref 80.0–100.0)
Monocytes Absolute: 0.3 10*3/uL (ref 0.1–1.0)
Monocytes Relative: 9 %
Neutro Abs: 2.2 10*3/uL (ref 1.7–7.7)
Neutrophils Relative %: 72 %
Platelets: 72 10*3/uL — ABNORMAL LOW (ref 150–400)
RBC: 2.07 MIL/uL — ABNORMAL LOW (ref 3.87–5.11)
RDW: 22 % — ABNORMAL HIGH (ref 11.5–15.5)
WBC: 3 10*3/uL — ABNORMAL LOW (ref 4.0–10.5)
nRBC: 0 % (ref 0.0–0.2)

## 2022-03-29 LAB — COMPREHENSIVE METABOLIC PANEL
ALT: 34 U/L (ref 0–44)
AST: 135 U/L — ABNORMAL HIGH (ref 15–41)
Albumin: 2.3 g/dL — ABNORMAL LOW (ref 3.5–5.0)
Alkaline Phosphatase: 172 U/L — ABNORMAL HIGH (ref 38–126)
Anion gap: 9 (ref 5–15)
BUN: 42 mg/dL — ABNORMAL HIGH (ref 8–23)
CO2: 20 mmol/L — ABNORMAL LOW (ref 22–32)
Calcium: 8.3 mg/dL — ABNORMAL LOW (ref 8.9–10.3)
Chloride: 108 mmol/L (ref 98–111)
Creatinine, Ser: 1.78 mg/dL — ABNORMAL HIGH (ref 0.44–1.00)
GFR, Estimated: 29 mL/min — ABNORMAL LOW (ref >60)
Glucose, Bld: 90 mg/dL (ref 70–99)
Potassium: 3.9 mmol/L (ref 3.5–5.1)
Sodium: 137 mmol/L (ref 135–145)
Total Bilirubin: 1.8 mg/dL — ABNORMAL HIGH (ref 0.3–1.2)
Total Protein: 5.2 g/dL — ABNORMAL LOW (ref 6.5–8.1)

## 2022-03-29 LAB — PHOSPHORUS: Phosphorus: 3.6 mg/dL (ref 2.5–4.6)

## 2022-03-29 LAB — MAGNESIUM: Magnesium: 2.3 mg/dL (ref 1.7–2.4)

## 2022-03-29 MED ORDER — ALBUMIN HUMAN 25 % IV SOLN
12.5000 g | Freq: Two times a day (BID) | INTRAVENOUS | Status: DC
  Administered 2022-03-29 – 2022-04-03 (×10): 12.5 g via INTRAVENOUS
  Filled 2022-03-29 (×10): qty 50

## 2022-03-29 MED ORDER — FUROSEMIDE 10 MG/ML IJ SOLN
80.0000 mg | Freq: Two times a day (BID) | INTRAMUSCULAR | Status: DC
  Administered 2022-03-29 – 2022-04-03 (×10): 80 mg via INTRAVENOUS
  Filled 2022-03-29 (×10): qty 8

## 2022-03-29 NOTE — Progress Notes (Signed)
PROGRESS NOTE    Catherine Decker  ZCH:[id number] DOB: May 24, 1940 DOA: 03/14/2022 PCP: Antony Contras, MD   Brief Narrative:  The Patient is an obese 82 year old female history of hypothyroidism, hypertension, as well as other comorbidities who presented to the ED after she fell down the stairs 2 days prior to admission slipped while walking hit her head on the way down but did not suffer any LOC. Patient was able to get up with help from a friend however decided to stay home. Patient's daughter noted that patient had more confusion and fatigue over the past 2 days prior to admission with decreased appetite and a 1-1/2-week history of cough and congestion and subsequently brought to the ED as patient with no clinical improvement.    Patient was seen in the ED, workup done concerning for subdural hematoma, chronic hygromas, multiple rib fractures, pneumonia, influenza A, AKI on CKD stage IIIb. Neurosurgery reviewed films and felt no further workup needed intervention at this time. Trauma surgery also assessed the patient and recommended pain management, pulmonary toileting. Patient admitted placed on empiric IV antibiotics and Tamiflu.  She is subsequently improving but renal function continues to be elevated and now slowly improving.  Will need to ensure adequate pain control.  PT OT still recommending SNF and will discharge when she is little bit more improved from a lab perspective as she is agreeable to SNF.   Legs remain swollen she is going to get another dose of IV Lasix but will need to watch her renal function carefully.   Discussed with Dr. Lorenso Courier yesterday in the morning given that the CT scans that she had last week showed multiple metastatic lesions and he recommended further workup and obtaining a CT-guided biopsy of the left iliac bone marrow biopsy.  She still is a little volume overloaded so we will give her another dose of IV Lasix  Assessment and Plan:  Fall with  multiple rib  fractures Spinous process fracture of T1 Subdural hematoma/chronic hygromas -reports She slipped while walking down stairs -Patient assessed by the trauma team who  recommended conservative management. --Films reviewed by neurosurgery who feel no intervention needed at this time. --seen by PT/OT , SNF placement when she is diuresed and volume status improves   Influenza A/lobar pneumonia (RML and RLL)/lactic acidosis on presentation  -Urine strep pneumo antigen positive, treated with 5 days of Rocephin and azithromycin -Initiated Tamiflu and completed 5 Days -repeat cxr on 1/17 am showed "Low lung volumes with bronchovascular crowding" but no reports of infiltrates -cough appears has resolved  -Recommend follow-up on chest CT chest at 6-8 weeks.       Volume Overload bilateral pleural effusions , bilateral lower extremity edema /acute on Chronic Diastolic CHF, she also has hypoalbuminemia --BNP was 40.5 but not accurate as patient is obese -Echocardiogram done and showed "lvef 60-65%, mild left ventricular hypertrophy.  Grade I diastolic dysfunction (impaired relaxation). Elevated left ventricular end-diastolic pressure. " -- Venous Doppler no DVT -Remain volume overloaded, increase IV Lasix '80mg'$   with albumin to BID as long as bp and cr able to tolerate  - insert foley for accurate urine output measuring, continue daily weight -placed , TED hose placed  Hyponatremia -Likely due to volume overload, improving ,continue Lasix , monitor BMP  hypokalemia Replaced and improved, continue monitor while on Lasix  Abnormal LFTs/Transaminitis/ history of cirrhosis with portal hypertension -Right upper quadrant ultrasound consistent with cirrhosis with portal hypertension, low-density structure in the left lobe of the  liver highlighted on prior noncontrast CTs up oropharynx. -Could be from liver congestion volume overload/continue Lasix   AKI on CKD stage IIIb Metabolic Acidosis, received  bicarb -holding  ARB. -Renal ultrasound negative for hydronephrosis. -Urinalysis with concerns for UTI, urine culture no growth, received abx for PNA -possible component of cardiorenal ? Continue diuresis, monitor cr, appear slowly improving      Hyperlipidemia -Continue to hold home regimen for now    Hypothyroidism -Continue home regimen of Levothyroxine 112 mcg po Daily.   history of Breast Cancer S/p right mastectomy CT imaging  on presentation showed bone lesions concerning for metastasis: "-Lucent lesion is identified within the posterior aspect of the T1 vertebral body measuring 1.4 cm.  - Left Iliac Wing Lytic Lesion -right acromion process lesion"  --Continue home regimen with Letrozole 2.5 mg po Daily.    -s/p  CT  guided biopsy of the lytic bone lesions + metastatic carcinoma, additional testing in process, oncology recommend rad onc consult   Oncology also ordered following tests:  -multiple myeloma panel in process - kappa and lambda light chains elevated but with normal ratio (possible related to ckd) - LDH elevated at  256  -Appreciate oncology  and rad onc input, will follow recommendation       Pancytopenia, likely multifactorial, h/o cirrhosis and currently concerning for metastatic cancer -Patient with a pancytopenia being followed by hematology/oncology in the outpatient setting. -Patient with no S/Sx of bleeding, stool is brown ---oncology is checking multiple myeloma panel; - anemia panel showed  an iron level of 58, UIBC 146, TIBC of 204, saturation ratios of 28%, ferritin level 303, folate level 11.9, vitamin B12 2501, elevated retic count  -.Hgb/Hct is relatively stable and Plt Count and WBC count improving , follow heme-onc recommendation      Diabetes Mellitus Type 2, non-insulin-dependent -Hemoglobin A1c 5.0 (11/05/2020). -CBG's ranging from 80-138 -Hemoglobin A1c 4.9.   -discontinue home oral meds actos ( can also cause edema) -Discontinued  SSI, CBGs.  -Outpatient follow-up.      Hypertension -BP stable and had been somewhat soft. -Hold home medication losartan -Getting IV albumin with iv lasix   Pressure injury, stage I medial buttocks, POA Pressure Injury 03/14/22 Buttocks Medial;Mid Stage 1 -  Intact skin with non-blanchable redness of a localized area usually over a bony prominence. (Active)  03/14/22 2200  Location: Buttocks  Location Orientation: Medial;Mid  Staging: Stage 1 -  Intact skin with non-blanchable redness of a localized area usually over a bony prominence.  Wound Description (Comments):   Present on Admission: Yes      Obesity -Complicates overall prognosis and care -Estimated body mass index is 30.5 kg/m as calculated from the following:   Height as of this encounter: '5\' 3"'$  (1.6 m).   Weight as of this encounter: 78.1 kg.  -Weight Loss and Dietary Counseling given  DVT prophylaxis: Place TED hose Start: 03/26/22 1847 SCDs Start: 03/14/22 2212 Place and maintain sequential compression device Start: 03/14/22 1706    Code Status: DNR Family Communication: daughter over the phone on 1/18  Disposition Plan:  Level of care: Progressive Remains inpatient appropriate because: Needs iv diuresis, remains significantly volume overloaded, SNF once volume improves Needs outpatient follow up with oncology/rad onc   Consultants:  Medical oncology and radiation oncology General Surgery/Trauma Team Dr. Bobbye Morton Neurosurgery IR  Procedures:  Lytic iliac Wing Bone Marrow Bx 03/25/2022  Antimicrobials:  Anti-infectives (From admission, onward)    Start     Dose/Rate  Route Frequency Ordered Stop   03/15/22 1600  azithromycin (ZITHROMAX) 500 mg in sodium chloride 0.9 % 250 mL IVPB  Status:  Discontinued        500 mg 250 mL/hr over 60 Minutes Intravenous Every 24 hours 03/14/22 2211 03/17/22 1759   03/15/22 1000  cefTRIAXone (ROCEPHIN) 2 g in sodium chloride 0.9 % 100 mL IVPB        2 g 200 mL/hr over  30 Minutes Intravenous Every 24 hours 03/14/22 2211 03/20/22 1520   03/15/22 1000  oseltamivir (TAMIFLU) capsule 30 mg        30 mg Oral Daily 03/15/22 0910 03/19/22 1200   03/14/22 1530  cefTRIAXone (ROCEPHIN) 1 g in sodium chloride 0.9 % 100 mL IVPB        1 g 200 mL/hr over 30 Minutes Intravenous  Once 03/14/22 1515 03/14/22 1606   03/14/22 1530  azithromycin (ZITHROMAX) 500 mg in sodium chloride 0.9 % 250 mL IVPB        500 mg 250 mL/hr over 60 Minutes Intravenous  Once 03/14/22 1515 03/14/22 1717       Subjective:  No acute interval changes She appear weak but oriented x 3, currently denies pain I did not hear her cough today during conversation, there is no hypoxia, no fever Reports some improvement of   bilateral lower extremity pitting edema.    Objective: Vitals:   03/28/22 1900 03/29/22 0547 03/29/22 1100 03/29/22 1159  BP: (!) 124/56 (!) 105/49  119/62  Pulse: 87 86  89  Resp: '19 19  19  '$ Temp: 98.9 F (37.2 C) 98.3 F (36.8 C)  98.2 F (36.8 C)  TempSrc: Oral Oral  Oral  SpO2: 98% 96%  99%  Weight:   78.1 kg   Height:   '5\' 3"'$  (1.6 m)     Intake/Output Summary (Last 24 hours) at 03/29/2022 1425 Last data filed at 03/29/2022 1100 Gross per 24 hour  Intake 120 ml  Output 999 ml  Net -879 ml    Filed Weights   03/27/22 0522 03/28/22 0500 03/29/22 1100  Weight: 81.3 kg 79.8 kg 78.1 kg   Examination: Physical Exam:  Constitutional Caucasian female appear weak but currently no acute distress, aaox3 Respiratory: Diminished to auscultation bilaterally, no wheezing, rales, rhonchi or crackles. Normal respiratory effort  Cardiovascular: RRR Abdomen: Soft, non-tender, distended secondary to body habitus bowel sounds positive.  Musculoskeletal: + pitting edema bilateral lower extremities. Skin: No rashes, lesions, ulcers on limited skin evaluation. No induration; Warm and dry. S/o right mastectomy Neurologic: CN 2-12 grossly intact with no focal deficits. Romberg  sign and cerebellar reflexes not assessed.  Psychiatric: Normal judgment and insight. Alert and oriented x 3. Normal mood and appropriate affect.   Data Reviewed: I have personally reviewed following labs and imaging studies  CBC: Recent Labs  Lab 03/23/22 1028 03/24/22 0425 03/25/22 1019 03/26/22 0413 03/28/22 0406 03/29/22 0450  WBC 6.2 5.1 7.3 5.7 3.6* 3.0*  NEUTROABS 5.1 4.1 5.7 4.6  --  2.2  HGB 8.7* 8.3* 9.0* 8.4* 7.7* 7.2*  HCT 27.7* 25.9* 27.8* 26.1* 24.4* 22.2*  MCV 107.4* 104.9* 104.5* 103.2* 107.0* 107.2*  PLT 106* 95* 121* 101* 82* 72*   Basic Metabolic Panel: Recent Labs  Lab 03/23/22 1028 03/24/22 0425 03/25/22 1019 03/26/22 0413 03/27/22 1002 03/28/22 0406 03/29/22 0450  NA 131* 131* 129* 132* 134* 136 137  K 4.5 3.8 3.8 3.6 3.5 3.8 3.9  CL 105 104 104 105 108  107 108  CO2 19* 20* 18* 19* 18* 17* 20*  GLUCOSE 147* 86 85 87 103* 91 90  BUN 46* 43* 44* 45* 45* 43* 42*  CREATININE 2.13* 2.07* 2.06* 2.03* 2.07* 1.86* 1.78*  CALCIUM 8.2* 8.1* 7.9* 8.0* 7.9* 8.1* 8.3*  MG 2.1 2.0 1.9 1.9 1.8  --  2.3  PHOS 3.4 3.7 4.1 4.0  --   --  3.6   GFR: Estimated Creatinine Clearance: 25 mL/min (A) (by C-G formula based on SCr of 1.78 mg/dL (H)). Liver Function Tests: Recent Labs  Lab 03/24/22 0425 03/25/22 1019 03/26/22 0413 03/28/22 0406 03/29/22 0450  AST 135* 147* 156* 144* 135*  ALT 37 41 39 37 34  ALKPHOS 162* 213* 187* 203* 172*  BILITOT 1.9* 1.9* 1.8* 1.6* 1.8*  PROT 4.9* 6.0* 5.1* 5.5* 5.2*  ALBUMIN 2.0* 2.3* 2.1* 2.3* 2.3*   No results for input(s): "LIPASE", "AMYLASE" in the last 168 hours. No results for input(s): "AMMONIA" in the last 168 hours. Coagulation Profile: Recent Labs  Lab 03/25/22 0409  INR 1.4*   Cardiac Enzymes: No results for input(s): "CKTOTAL", "CKMB", "CKMBINDEX", "TROPONINI" in the last 168 hours. BNP (last 3 results) No results for input(s): "PROBNP" in the last 8760 hours. HbA1C: No results for input(s): "HGBA1C"  in the last 72 hours. CBG: No results for input(s): "GLUCAP" in the last 168 hours. Lipid Profile: No results for input(s): "CHOL", "HDL", "LDLCALC", "TRIG", "CHOLHDL", "LDLDIRECT" in the last 72 hours. Thyroid Function Tests: No results for input(s): "TSH", "T4TOTAL", "FREET4", "T3FREE", "THYROIDAB" in the last 72 hours. Anemia Panel: No results for input(s): "VITAMINB12", "FOLATE", "FERRITIN", "TIBC", "IRON", "RETICCTPCT" in the last 72 hours.  Sepsis Labs: Recent Labs  Lab 03/27/22 0407  LATICACIDVEN 2.4*    No results found for this or any previous visit (from the past 240 hour(s)).   Radiology Studies: No results found.  Scheduled Meds:  calcium carbonate  1,250 mg Oral Q breakfast   cholecalciferol  1,000 Units Oral Daily   cyanocobalamin  1,000 mcg Oral Daily   feeding supplement (GLUCERNA SHAKE)  237 mL Oral TID BM   fluticasone  2 spray Each Nare Daily   folic acid  1 mg Oral Daily   furosemide  80 mg Intravenous BID   guaiFENesin  1,200 mg Oral BID   hydrocortisone   Rectal TID   letrozole  2.5 mg Oral QODAY   levothyroxine  112 mcg Oral Q0600   loratadine  10 mg Oral Daily   methocarbamol  1,000 mg Oral TID   pantoprazole  40 mg Oral Daily   potassium chloride  40 mEq Oral Daily   senna-docusate  1 tablet Oral BID   sodium bicarbonate  650 mg Oral BID   Continuous Infusions:  albumin human       LOS: 15 days   Florencia Reasons, MD PhD FACP [facility] Available via Epic secure chat 7am-7pm After these hours, please refer to coverage provider listed on [email or web address] 03/29/2022, 2:25 PM

## 2022-03-29 NOTE — Progress Notes (Signed)
Mobility Specialist - Progress Note   03/29/22 1434  Mobility  Activity Ambulated with assistance to bathroom  Level of Assistance Contact guard assist, steadying assist  Assistive Device Front wheel walker  Distance Ambulated (ft) 20 ft  Range of Motion/Exercises Active  Activity Response Tolerated well  Mobility Referral Yes  $Mobility charge 1 Mobility   Pt was found on recliner chair asking for assistance to bathroom. Had no complaints and returned to bed with all necessities in reach. Daughter in room.  Ferd Hibbs Mobility Specialist

## 2022-03-30 DIAGNOSIS — I5033 Acute on chronic diastolic (congestive) heart failure: Secondary | ICD-10-CM | POA: Diagnosis not present

## 2022-03-30 DIAGNOSIS — J09X1 Influenza due to identified novel influenza A virus with pneumonia: Secondary | ICD-10-CM | POA: Diagnosis not present

## 2022-03-30 DIAGNOSIS — J181 Lobar pneumonia, unspecified organism: Secondary | ICD-10-CM | POA: Diagnosis not present

## 2022-03-30 DIAGNOSIS — R627 Adult failure to thrive: Secondary | ICD-10-CM | POA: Diagnosis not present

## 2022-03-30 LAB — CBC
HCT: 21.9 % — ABNORMAL LOW (ref 36.0–46.0)
Hemoglobin: 7.1 g/dL — ABNORMAL LOW (ref 12.0–15.0)
MCH: 35.1 pg — ABNORMAL HIGH (ref 26.0–34.0)
MCHC: 32.4 g/dL (ref 30.0–36.0)
MCV: 108.4 fL — ABNORMAL HIGH (ref 80.0–100.0)
Platelets: 69 10*3/uL — ABNORMAL LOW (ref 150–400)
RBC: 2.02 MIL/uL — ABNORMAL LOW (ref 3.87–5.11)
RDW: 22.6 % — ABNORMAL HIGH (ref 11.5–15.5)
WBC: 2.4 10*3/uL — ABNORMAL LOW (ref 4.0–10.5)
nRBC: 0 % (ref 0.0–0.2)

## 2022-03-30 LAB — COMPREHENSIVE METABOLIC PANEL WITH GFR
ALT: 31 U/L (ref 0–44)
AST: 125 U/L — ABNORMAL HIGH (ref 15–41)
Albumin: 2.6 g/dL — ABNORMAL LOW (ref 3.5–5.0)
Alkaline Phosphatase: 185 U/L — ABNORMAL HIGH (ref 38–126)
Anion gap: 7 (ref 5–15)
BUN: 43 mg/dL — ABNORMAL HIGH (ref 8–23)
CO2: 24 mmol/L (ref 22–32)
Calcium: 8.3 mg/dL — ABNORMAL LOW (ref 8.9–10.3)
Chloride: 109 mmol/L (ref 98–111)
Creatinine, Ser: 1.66 mg/dL — ABNORMAL HIGH (ref 0.44–1.00)
GFR, Estimated: 31 mL/min — ABNORMAL LOW
Glucose, Bld: 95 mg/dL (ref 70–99)
Potassium: 3.7 mmol/L (ref 3.5–5.1)
Sodium: 140 mmol/L (ref 135–145)
Total Bilirubin: 1.7 mg/dL — ABNORMAL HIGH (ref 0.3–1.2)
Total Protein: 5.5 g/dL — ABNORMAL LOW (ref 6.5–8.1)

## 2022-03-30 MED ORDER — ORAL CARE MOUTH RINSE: 15.0000 mL | OROMUCOSAL | Status: DC | PRN

## 2022-03-30 NOTE — Progress Notes (Signed)
Occupational Therapy Treatment Patient Details Name: Catherine Decker MRN: [id number] DOB: 25-Dec-1940 Today's Date: 03/30/2022   History of present illness Patient is a 82 year old female who presented after a fall down some stairs. Patient was found to have left rib fractures 4-5, right rib fractures 7-8, T7 compression fx, T1 spinous process fx, SDH.  Pt also found to have influenza A. HQI:ONGEX breast cancer, arthritis, hyperlipidemia, splenic artery aneurysm   OT comments  Patient was noted to be quick to fatigue during session. Patient was noted to have thigh high compression stockings in place scrunched down to ankles. Patient was educated on keeping stockings without wrinkles to prevent pitting of edema. Patient was max A to adjust stockings. Patient verbalized understanding. Patient was educated on BUE and BLE movement to participate in daily to maintain ROM within what ribs can tolerate. Patient verbalized/demonstrated understanding. Patient's discharge plan remains appropriate at this time. OT will continue to follow acutely.     Recommendations for follow up therapy are one component of a multi-disciplinary discharge planning process, led by the attending physician.  Recommendations may be updated based on patient status, additional functional criteria and insurance authorization.    Follow Up Recommendations  Skilled nursing-short term rehab (<3 hours/day)     Assistance Recommended at Discharge Frequent or constant Supervision/Assistance  Patient can return home with the following  A little help with walking and/or transfers;A lot of help with bathing/dressing/bathroom;Assistance with cooking/housework;Help with stairs or ramp for entrance   Equipment Recommendations  None recommended by OT    Recommendations for Other Services      Precautions / Restrictions Precautions Precautions: Fall Precaution Comments: rib fractures, back precauitons Restrictions Weight Bearing  Restrictions: No              ADL either performed or assessed with clinical judgement   ADL Overall ADL's : Needs assistance/impaired         Toilet Transfer: Min guard;Ambulation;Rolling walker (2 wheels) Toilet Transfer Details (indicate cue type and reason): to transfer from [facility] to bathroom and back. patient was educated on importance of walking to bathroom especially during teh day to increase functional activity tolerance. patient verbalized understanding. Toileting- Clothing Manipulation and Hygiene: Supervision/safety;Sit to/from stand Toileting - Clothing Manipulation Details (indicate cue type and reason): with RW. noted to have bright red discharge. patient reported hemrroids.     Functional mobility during ADLs: Min guard;Rolling walker (2 wheels) General ADL Comments: in room with patient quick to fatigue. patient participated in ROM of BUE and BLE at bed level with education on movements to participate in during the day. patient verbalized understanding and demonstrated understanding.      Cognition Arousal/Alertness: Awake/alert Behavior During Therapy: WFL for tasks assessed/performed Overall Cognitive Status: Within Functional Limits for tasks assessed                           Pertinent Vitals/ Pain       Pain Assessment Pain Assessment: Faces Faces Pain Scale: Hurts a little bit Pain Location: R ribs with activity Pain Descriptors / Indicators: Discomfort, Grimacing Pain Intervention(s): Limited activity within patient's tolerance, Monitored during session         Frequency  Min 2X/week        Progress Toward Goals  OT Goals(current goals can now be found in the care plan section)  Progress towards OT goals: Progressing toward goals     Plan Discharge plan remains appropriate  AM-PAC OT "6 Clicks" Daily Activity     Outcome Measure   Help from another person eating meals?: None Help from another person taking care of  personal grooming?: A Little Help from another person toileting, which includes using toliet, bedpan, or urinal?: A Little Help from another person bathing (including washing, rinsing, drying)?: A Little Help from another person to put on and taking off regular upper body clothing?: A Little Help from another person to put on and taking off regular lower body clothing?: A Lot 6 Click Score: 18    End of Session Equipment Utilized During Treatment: Rolling walker (2 wheels)  OT Visit Diagnosis: Unsteadiness on feet (R26.81);Other abnormalities of gait and mobility (R26.89);Muscle weakness (generalized) (M62.81)   Activity Tolerance Patient tolerated treatment well   Patient Left in bed;with call bell/phone within reach;with bed alarm set   Nurse Communication Mobility status        Time: 0712-1975 OT Time Calculation (min): 13 min  Charges: OT General Charges $OT Visit: 1 Visit OT Treatments $Self Care/Home Management : 8-22 mins  Rennie Plowman, MS Acute Rehabilitation Department Office# [phone]   Willa Rough 03/30/2022, 11:50 AM

## 2022-03-30 NOTE — Progress Notes (Signed)
Physical Therapy Treatment Patient Details Name: Catherine Decker MRN: [id number] DOB: 1940/07/02 Today's Date: 03/30/2022   History of Present Illness Patient is a 82 year old female who presented after a fall down some stairs. Patient was found to have left rib fractures 4-5, right rib fractures 7-8, T7 compression fx, T1 spinous process fx, SDH.  Pt also found to have influenza A. BPZ:WCHEN breast cancer, arthritis, hyperlipidemia, splenic artery aneurysm    PT Comments    Pt on BSC on arrival, reports fatigue from being in chair most of day, agreeable to bed level exercises which were tolerated well; pt able to demonstrate exercises she has been doing on her own. Plan continues to be for SNF, continue efforts to progress pt in acute setting.  Recommendations for follow up therapy are one component of a multi-disciplinary discharge planning process, led by the attending physician.  Recommendations may be updated based on patient status, additional functional criteria and insurance authorization.  Follow Up Recommendations  Skilled nursing-short term rehab (<3 hours/day) Can patient physically be transported by private vehicle: Yes   Assistance Recommended at Discharge Intermittent Supervision/Assistance  Patient can return home with the following A little help with walking and/or transfers;A little help with bathing/dressing/bathroom;Assistance with cooking/housework;Assist for transportation;Help with stairs or ramp for entrance   Equipment Recommendations  None recommended by PT    Recommendations for Other Services       Precautions / Restrictions Precautions Precautions: Fall Precaution Comments: rib fractures, back precautions Restrictions Weight Bearing Restrictions: No     Mobility  Bed Mobility Overal bed mobility: Needs Assistance Bed Mobility: Sit to Supine       Sit to supine: HOB elevated, Supervision   General bed mobility comments: Supv for safety.  Increased time, HOB at 30 degrees    Transfers Overall transfer level: Needs assistance Equipment used: Rolling walker (2 wheels) Transfers: Sit to/from Stand, Bed to chair/wheelchair/BSC Sit to Stand: Min guard   Step pivot transfers: Min guard       General transfer comment: Min guard for safety. Cues for safety, hand placement. Increased time.    Ambulation/Gait               General Gait Details:  (pt declined amb d/t fatigue)   Stairs             Wheelchair Mobility    Modified Rankin (Stroke Patients Only)       Balance                                            Cognition Arousal/Alertness: Awake/alert Behavior During Therapy: WFL for tasks assessed/performed Overall Cognitive Status: Within Functional Limits for tasks assessed                                 General Comments: plesant and cooperative, fatigued from being OOB today        Exercises General Exercises - Lower Extremity Ankle Circles/Pumps: AROM, 10 reps, Both Quad Sets: AROM, Both, 10 reps Gluteal Sets: AROM, Both, 10 reps Heel Slides: AROM, Both, 10 reps Hip ABduction/ADduction: AROM, Both, 10 reps    General Comments        Pertinent Vitals/Pain Pain Assessment Pain Assessment: No/denies pain Pain Intervention(s): Monitored during session    Home Living  Prior Function            PT Goals (current goals can now be found in the care plan section) Acute Rehab PT Goals PT Goal Formulation: With patient Time For Goal Achievement: 03/30/22 Potential to Achieve Goals: Good Progress towards PT goals: Progressing toward goals    Frequency    Min 3X/week      PT Plan Current plan remains appropriate    Co-evaluation              AM-PAC PT "6 Clicks" Mobility   Outcome Measure  Help needed turning from your back to your side while in a flat bed without using bedrails?: None Help  needed moving from lying on your back to sitting on the side of a flat bed without using bedrails?: None Help needed moving to and from a bed to a chair (including a wheelchair)?: A Little Help needed standing up from a chair using your arms (e.g., wheelchair or bedside chair)?: A Little Help needed to walk in hospital room?: A Little Help needed climbing 3-5 steps with a railing? : A Little 6 Click Score: 20    End of Session   Activity Tolerance: Patient tolerated treatment well;Patient limited by fatigue Patient left: in bed;with call bell/phone within reach;with bed alarm set   PT Visit Diagnosis: Difficulty in walking, not elsewhere classified (R26.2);Unsteadiness on feet (R26.81);Muscle weakness (generalized) (M62.81)     Time: 9390-3009 PT Time Calculation (min) (ACUTE ONLY): 21 min  Charges:  $Therapeutic Exercise: 8-22 mins                     Baxter Flattery, PT  Acute Rehab Dept [facility]) [phone]  [facility] Weekend Pager [facility] only)  [phone]  03/30/2022    [facility] 03/30/2022, 3:47 PM

## 2022-03-30 NOTE — Progress Notes (Signed)
PROGRESS NOTE    Catherine Decker  ZOX:[id number] DOB: 06/02/1940 DOA: 03/14/2022 PCP: Antony Contras, MD   Brief Narrative:   [place] down from stairs at home with multiple rib fractures,SDH and vertebral fractures. Case discussed with  Trauma surgery and  Neurosurgery  No acute interventions needed. Finished treatment for PNA and flu.  S/p left iliac wing bone biopsy on 1/16 + metastatic carcinoma, Dr. Lorenso Courier recommended radon consult   Also has significant volume overload, getting iv lasix with albumin, foley inserted on 1/20 for accurate I/o's, voiding trial before discharge  go to SNF after cleared by oncology /rad on  and  her edema improves with diuresis   Assessment and Plan:  Fall with  multiple rib fractures Spinous process fracture of T1 Subdural hematoma/chronic hygromas -reports She slipped while walking down stairs - conservative management per trauma and neurosurgery recommendation  --seen by PT/OT , SNF placement when she is diuresed and volume status improves   Influenza A/lobar pneumonia (RML and RLL)/lactic acidosis on presentation  -Urine strep pneumo antigen positive, treated with 5 days of Rocephin and azithromycin -Initiated Tamiflu and completed 5 Days -repeat cxr on 1/17 am showed "Low lung volumes with bronchovascular crowding" but no reports of infiltrates -cough appears has resolved  -Recommend follow-up on chest CT chest at 6-8 weeks.       Volume Overload bilateral pleural effusions , bilateral lower extremity edema /acute on Chronic Diastolic CHF, she also has hypoalbuminemia -Echocardiogram done and showed "lvef 60-65%, mild left ventricular hypertrophy.  Grade I diastolic dysfunction (impaired relaxation). Elevated left ventricular end-diastolic pressure. " -- Venous Doppler no DVT -urine output picked up, edema start to come down with IV Lasix '80mg'$   with albumin to BID , continue as long as bp and cr able to tolerate  - foley inserted on 1/20 for  accurate urine output measuring, continue daily weight -TED hose placed  Hyponatremia -Likely due to volume overload, normalized -continue Lasix , monitor BMP  hypokalemia Replaced and improved, continue monitor while on Lasix  Abnormal LFTs/Transaminitis/ history of cirrhosis with portal hypertension -Right upper quadrant ultrasound consistent with cirrhosis with portal hypertension, low-density structure in the left lobe of the liver highlighted on prior noncontrast CTs up oropharynx. -hepatitis panel negative  -Could be from liver congestion volume overload as well -Will check ck -continue Lasix , monitor lft  AKI on CKD stage IIIb Metabolic Acidosis, on bicarb -Renal ultrasound negative for hydronephrosis. -Urinalysis with concerns for UTI, urine culture no growth, received abx for PNA -possible component of cardiorenal ? Continue diuresis, monitor cr, appear slowly improving ,holding  ARB.    history of Breast Cancer S/p right mastectomy Continue home regimen with Letrozole 2.5 mg po Daily.   New bone lytic lesions -CT imaging  on presentation showed bone lesions concerning for metastasis: "-Lucent lesion is identified within the posterior aspect of the T1 vertebral body measuring 1.4 cm.  - Left Iliac Wing Lytic Lesion -right acromion process lesion"  --  -s/p  CT  guided biopsy of the lytic bone lesions + metastatic carcinoma, additional testing in process, oncology recommend rad onc consult   Oncology also ordered following tests:  -multiple myeloma panel in process - kappa and lambda light chains elevated but with normal ratio (possible related to ckd) - LDH elevated at  256  -Appreciate oncology  and rad onc input,  - per Rad onc note MRI of thoracic and lumbar scan ordered, I do not see order, plan to  verify with rad onc on Monday      Pancytopenia, likely multifactorial, h/o cirrhosis and currently concerning for metastatic cancer -Patient with a pancytopenia  being followed by hematology/oncology in the outpatient setting.- ---oncology is checking multiple myeloma panel; - anemia panel showed not consistent with iron deficiency, elevated retic count  -Patient with no S/Sx of bleeding, stool is brown -follow heme-onc recommendation   Hypertension -BP stable and had been somewhat soft. -Hold home medication losartan -Getting IV albumin with iv lasix  Hyperlipidemia -Continue to hold statin and fenofibrate  for now due to elevated lft     Diabetes Mellitus Type 2, non-insulin-dependent, currently diet controlled -Hemoglobin A1c 5.0 (11/05/2020).-Hemoglobin A1c 4.9 (03/16/22).   --discontinue home oral meds actos ( can also cause edema) -Discontinued SSI, CBGs.  -continue diet control -Outpatient follow-up.       Hypothyroidism -Continue home regimen of Levothyroxine 112 mcg po Daily.   Body mass index is 30.19 kg/m. Meet obesity criteria     Pressure injury, stage I medial buttocks, POA Pressure Injury 03/14/22 Buttocks Medial;Mid Stage 1 -  Intact skin with non-blanchable redness of a localized area usually over a bony prominence. (Active)  03/14/22 2200  Location: Buttocks  Location Orientation: Medial;Mid  Staging: Stage 1 -  Intact skin with non-blanchable redness of a localized area usually over a bony prominence.  Wound Description (Comments):   Present on Admission: Yes      DVT prophylaxis: Place TED hose Start: 03/26/22 1847 SCDs Start: 03/14/22 2212 Place and maintain sequential compression device Start: 03/14/22 1706    Code Status: DNR Family Communication: daughter over the phone on 1/18  Disposition Plan:  Level of care: Med-Surg Remains inpatient appropriate because: Needs iv diuresis, remains significantly volume overloaded, SNF once volume improves Needs outpatient follow up with oncology/rad onc   Consultants:  Medical oncology and radiation oncology General Surgery/Trauma Team Dr.  Bobbye Morton Neurosurgery IR  Procedures:  Lytic iliac Wing Bone Marrow Bx 03/25/2022  Antimicrobials:  Anti-infectives (From admission, onward)    Start     Dose/Rate Route Frequency Ordered Stop   03/15/22 1600  azithromycin (ZITHROMAX) 500 mg in sodium chloride 0.9 % 250 mL IVPB  Status:  Discontinued        500 mg 250 mL/hr over 60 Minutes Intravenous Every 24 hours 03/14/22 2211 03/17/22 1759   03/15/22 1000  cefTRIAXone (ROCEPHIN) 2 g in sodium chloride 0.9 % 100 mL IVPB        2 g 200 mL/hr over 30 Minutes Intravenous Every 24 hours 03/14/22 2211 03/20/22 1520   03/15/22 1000  oseltamivir (TAMIFLU) capsule 30 mg        30 mg Oral Daily 03/15/22 0910 03/19/22 1200   03/14/22 1530  cefTRIAXone (ROCEPHIN) 1 g in sodium chloride 0.9 % 100 mL IVPB        1 g 200 mL/hr over 30 Minutes Intravenous  Once 03/14/22 1515 03/14/22 1606   03/14/22 1530  azithromycin (ZITHROMAX) 500 mg in sodium chloride 0.9 % 250 mL IVPB        500 mg 250 mL/hr over 60 Minutes Intravenous  Once 03/14/22 1515 03/14/22 1717       Subjective:  No acute interval changes, no fever, no hypoxia, edema start to improve some, 1.6 liter urine output documented, urine is clear  She appear weak but oriented x 3, currently denies pain   Objective: Vitals:   03/29/22 1159 03/29/22 2031 03/30/22 0440 03/30/22 0443  BP: 119/62  117/63 (!) 102/36   Pulse: 89 88 90   Resp: '19 20 16   '$ Temp: 98.2 F (36.8 C) 98.1 F (36.7 C) 98.1 F (36.7 C)   TempSrc: Oral Oral Oral   SpO2: 99% 97% 97%   Weight:    77.3 kg  Height:        Intake/Output Summary (Last 24 hours) at 03/30/2022 1104 Last data filed at 03/30/2022 0830 Gross per 24 hour  Intake 530 ml  Output 1600 ml  Net -1070 ml    Filed Weights   03/28/22 0500 03/29/22 1100 03/30/22 0443  Weight: 79.8 kg 78.1 kg 77.3 kg   Examination: Physical Exam:  Constitutional Caucasian female appear weak but currently no acute distress, aaox3 Respiratory: Diminished  to auscultation bilaterally, no wheezing, rales, rhonchi or crackles. Normal respiratory effort  Cardiovascular: RRR Abdomen: Soft, non-tender, distended secondary to body habitus bowel sounds positive.  Musculoskeletal: + pitting edema bilateral lower extremities, lateral thighs,  Skin: No rashes, lesions, ulcers . S/p right mastectomy Neurologic: CN 2-12 grossly intact with no focal deficits.  Psychiatric: Normal judgment and insight. Alert and oriented x 3. Normal mood and appropriate affect.   Data Reviewed: I have personally reviewed following labs and imaging studies  CBC: Recent Labs  Lab 03/24/22 0425 03/25/22 1019 03/26/22 0413 03/28/22 0406 03/29/22 0450 03/30/22 0543  WBC 5.1 7.3 5.7 3.6* 3.0* 2.4*  NEUTROABS 4.1 5.7 4.6  --  2.2  --   HGB 8.3* 9.0* 8.4* 7.7* 7.2* 7.1*  HCT 25.9* 27.8* 26.1* 24.4* 22.2* 21.9*  MCV 104.9* 104.5* 103.2* 107.0* 107.2* 108.4*  PLT 95* 121* 101* 82* 72* 69*   Basic Metabolic Panel: Recent Labs  Lab 03/24/22 0425 03/25/22 1019 03/26/22 0413 03/27/22 1002 03/28/22 0406 03/29/22 0450 03/30/22 0543  NA 131* 129* 132* 134* 136 137 140  K 3.8 3.8 3.6 3.5 3.8 3.9 3.7  CL 104 104 105 108 107 108 109  CO2 20* 18* 19* 18* 17* 20* 24  GLUCOSE 86 85 87 103* 91 90 95  BUN 43* 44* 45* 45* 43* 42* 43*  CREATININE 2.07* 2.06* 2.03* 2.07* 1.86* 1.78* 1.66*  CALCIUM 8.1* 7.9* 8.0* 7.9* 8.1* 8.3* 8.3*  MG 2.0 1.9 1.9 1.8  --  2.3  --   PHOS 3.7 4.1 4.0  --   --  3.6  --    GFR: Estimated Creatinine Clearance: 26.6 mL/min (A) (by C-G formula based on SCr of 1.66 mg/dL (H)). Liver Function Tests: Recent Labs  Lab 03/25/22 1019 03/26/22 0413 03/28/22 0406 03/29/22 0450 03/30/22 0543  AST 147* 156* 144* 135* 125*  ALT 41 39 37 34 31  ALKPHOS 213* 187* 203* 172* 185*  BILITOT 1.9* 1.8* 1.6* 1.8* 1.7*  PROT 6.0* 5.1* 5.5* 5.2* 5.5*  ALBUMIN 2.3* 2.1* 2.3* 2.3* 2.6*   No results for input(s): "LIPASE", "AMYLASE" in the last 168 hours. No  results for input(s): "AMMONIA" in the last 168 hours. Coagulation Profile: Recent Labs  Lab 03/25/22 0409  INR 1.4*   Cardiac Enzymes: No results for input(s): "CKTOTAL", "CKMB", "CKMBINDEX", "TROPONINI" in the last 168 hours. BNP (last 3 results) No results for input(s): "PROBNP" in the last 8760 hours. HbA1C: No results for input(s): "HGBA1C" in the last 72 hours. CBG: No results for input(s): "GLUCAP" in the last 168 hours. Lipid Profile: No results for input(s): "CHOL", "HDL", "LDLCALC", "TRIG", "CHOLHDL", "LDLDIRECT" in the last 72 hours. Thyroid Function Tests: No results for input(s): "TSH", "T4TOTAL", "FREET4", "  T3FREE", "THYROIDAB" in the last 72 hours. Anemia Panel: No results for input(s): "VITAMINB12", "FOLATE", "FERRITIN", "TIBC", "IRON", "RETICCTPCT" in the last 72 hours.  Sepsis Labs: Recent Labs  Lab 03/27/22 0407  LATICACIDVEN 2.4*    No results found for this or any previous visit (from the past 240 hour(s)).   Radiology Studies: No results found.  Scheduled Meds:  calcium carbonate  1,250 mg Oral Q breakfast   cholecalciferol  1,000 Units Oral Daily   cyanocobalamin  1,000 mcg Oral Daily   feeding supplement (GLUCERNA SHAKE)  237 mL Oral TID BM   fluticasone  2 spray Each Nare Daily   folic acid  1 mg Oral Daily   furosemide  80 mg Intravenous BID   guaiFENesin  1,200 mg Oral BID   hydrocortisone   Rectal TID   letrozole  2.5 mg Oral QODAY   levothyroxine  112 mcg Oral Q0600   loratadine  10 mg Oral Daily   methocarbamol  1,000 mg Oral TID   pantoprazole  40 mg Oral Daily   potassium chloride  40 mEq Oral Daily   senna-docusate  1 tablet Oral BID   sodium bicarbonate  650 mg Oral BID   Continuous Infusions:  albumin human 12.5 g (03/30/22 0827)     LOS: 16 days   Florencia Reasons, MD PhD FACP [facility] Available via Epic secure chat 7am-7pm After these hours, please refer to coverage provider listed on [email or web address] 03/30/2022, 11:04 AM

## 2022-03-31 ENCOUNTER — Ambulatory Visit: Payer: Medicare HMO | Admitting: Radiation Oncology

## 2022-03-31 ENCOUNTER — Inpatient Hospital Stay (HOSPITAL_COMMUNITY): Payer: Medicare HMO

## 2022-03-31 DIAGNOSIS — D696 Thrombocytopenia, unspecified: Secondary | ICD-10-CM

## 2022-03-31 DIAGNOSIS — J189 Pneumonia, unspecified organism: Secondary | ICD-10-CM | POA: Diagnosis not present

## 2022-03-31 DIAGNOSIS — N179 Acute kidney failure, unspecified: Secondary | ICD-10-CM | POA: Diagnosis not present

## 2022-03-31 DIAGNOSIS — W19XXXD Unspecified fall, subsequent encounter: Secondary | ICD-10-CM | POA: Diagnosis not present

## 2022-03-31 DIAGNOSIS — C7951 Secondary malignant neoplasm of bone: Secondary | ICD-10-CM | POA: Diagnosis not present

## 2022-03-31 LAB — MULTIPLE MYELOMA PANEL, SERUM
Albumin SerPl Elph-Mcnc: 2.3 g/dL — ABNORMAL LOW (ref 2.9–4.4)
Albumin/Glob SerPl: 0.8 (ref 0.7–1.7)
Alpha 1: 0.3 g/dL (ref 0.0–0.4)
Alpha2 Glob SerPl Elph-Mcnc: 0.4 g/dL (ref 0.4–1.0)
B-Globulin SerPl Elph-Mcnc: 0.8 g/dL (ref 0.7–1.3)
Gamma Glob SerPl Elph-Mcnc: 1.4 g/dL (ref 0.4–1.8)
Globulin, Total: 2.9 g/dL (ref 2.2–3.9)
IgA: 546 mg/dL — ABNORMAL HIGH (ref 64–422)
IgG (Immunoglobin G), Serum: 1363 mg/dL (ref 586–1602)
IgM (Immunoglobulin M), Srm: 130 mg/dL (ref 26–217)
Total Protein ELP: 5.2 g/dL — ABNORMAL LOW (ref 6.0–8.5)

## 2022-03-31 LAB — COMPREHENSIVE METABOLIC PANEL
ALT: 34 U/L (ref 0–44)
AST: 134 U/L — ABNORMAL HIGH (ref 15–41)
Albumin: 2.9 g/dL — ABNORMAL LOW (ref 3.5–5.0)
Alkaline Phosphatase: 182 U/L — ABNORMAL HIGH (ref 38–126)
Anion gap: 9 (ref 5–15)
BUN: 41 mg/dL — ABNORMAL HIGH (ref 8–23)
CO2: 22 mmol/L (ref 22–32)
Calcium: 8.7 mg/dL — ABNORMAL LOW (ref 8.9–10.3)
Chloride: 110 mmol/L (ref 98–111)
Creatinine, Ser: 1.76 mg/dL — ABNORMAL HIGH (ref 0.44–1.00)
GFR, Estimated: 29 mL/min — ABNORMAL LOW (ref >60)
Glucose, Bld: 89 mg/dL (ref 70–99)
Potassium: 3.7 mmol/L (ref 3.5–5.1)
Sodium: 141 mmol/L (ref 135–145)
Total Bilirubin: 2 mg/dL — ABNORMAL HIGH (ref 0.3–1.2)
Total Protein: 6 g/dL — ABNORMAL LOW (ref 6.5–8.1)

## 2022-03-31 LAB — CBC
HCT: 22.3 % — ABNORMAL LOW (ref 36.0–46.0)
Hemoglobin: 7 g/dL — ABNORMAL LOW (ref 12.0–15.0)
MCH: 34 pg (ref 26.0–34.0)
MCHC: 31.4 g/dL (ref 30.0–36.0)
MCV: 108.3 fL — ABNORMAL HIGH (ref 80.0–100.0)
Platelets: 66 10*3/uL — ABNORMAL LOW (ref 150–400)
RBC: 2.06 MIL/uL — ABNORMAL LOW (ref 3.87–5.11)
RDW: 22.6 % — ABNORMAL HIGH (ref 11.5–15.5)
WBC: 2.7 10*3/uL — ABNORMAL LOW (ref 4.0–10.5)
nRBC: 0 % (ref 0.0–0.2)

## 2022-03-31 LAB — LEGIONELLA PNEUMOPHILA SEROGP 1 UR AG: L. pneumophila Serogp 1 Ur Ag: NEGATIVE

## 2022-03-31 LAB — MAGNESIUM: Magnesium: 2 mg/dL (ref 1.7–2.4)

## 2022-03-31 LAB — CK: Total CK: 26 U/L — ABNORMAL LOW (ref 38–234)

## 2022-03-31 MED ORDER — GADOBUTROL 1 MMOL/ML IV SOLN
7.5000 mL | Freq: Once | INTRAVENOUS | Status: AC | PRN
  Administered 2022-03-31: 7.5 mL via INTRAVENOUS

## 2022-03-31 MED ORDER — CHLORHEXIDINE GLUCONATE CLOTH 2 % EX PADS
6.0000 | MEDICATED_PAD | Freq: Every day | CUTANEOUS | Status: DC
  Administered 2022-03-31 – 2022-04-02 (×3): 6 via TOPICAL

## 2022-03-31 NOTE — Care Management Important Message (Signed)
Important Message  Patient Details IM Letter given. Name: Catherine Decker MRN: [id number] Date of Birth: October 04, 1940   Medicare Important Message Given:  Yes     Kerin Salen 03/31/2022, 11:25 AM

## 2022-03-31 NOTE — TOC Progression Note (Signed)
Transition of Care [facility]) - Progression Note    Patient Details  Name: Catherine Decker MRN: [id number] Date of Birth: [birth year]  Transition of Care [facility]) CM/SW [place], LCSW Phone Number: 03/31/2022, 3:46 PM  Clinical Narrative:     Pt's insurance Jose[employer] Kauf[employer] is still pending.TOC to follow  Expected Discharge Plan: [place] Barriers to Discharge: Continued Medical Work up  Expected Discharge Plan and Services       Living arrangements for the past 2 months: Single Family Home                                       Social Determinants of Health (SDOH) Interventions SDOH Screenings   Food Insecurity: No Food Insecurity (03/14/2022)  Housing: Low Risk  (03/14/2022)  Transportation Needs: No Transportation Needs (03/14/2022)  Utilities: Not At Risk (03/14/2022)  Tobacco Use: Low Risk  (03/14/2022)    Readmission Risk Interventions     No data to display

## 2022-03-31 NOTE — Progress Notes (Signed)
PROGRESS NOTE    Catherine Decker  BOF:[id number] DOB: 29-Nov-1940 DOA: 03/14/2022 PCP: Antony Contras, MD   Brief Narrative:   Patient is a 82 years old female with past medical history of anemia, cirrhosis of liver, diabetes mellitus, GERD, hepatitis, hyperlipidemia, hypertension, hypothyroidism presented to hospital after sustaining a fall from her stairs.  Patient had multiple rib fractures subdural hematoma vertebral fractures.  Case was discussed with neurosurgery and trauma surgery and patient underwent conservative treatment during hospitalization.  She also tested positive for streptococcal pneumonia and influenza and has completed Rocephin and Zithromax and Tamiflu course.  Incidentally patient was noted to have multiple lytic bone lesions in underwent left iliac wing bone biopsy on 1/16 + with findings of metastatic carcinoma, moderately C was consulted and due to significant volume overload patient was started on Lasix and albumin.  At this time patient is awaiting for skilled nursing facility placement once her edema improved with diuresis and cleared by oncology and radiation oncology.  Assessment and Plan:  Fall with  multiple rib fractures Spinous process fracture of T1 Subdural hematoma/chronic hygromas Status post mechanical fall.  Patient was seen by trauma neurosurgery and conservative management underway.  Physical therapy has recommended skilled nursing facility placement at this time.   Influenza A/lobar pneumonia (RML and RLL) -Had elevated lactate.  Patient tested positive for urinary pneumococcal antigen.  Has received 5-day course of Rocephin and Zithromax.  Was on Tamiflu which the patient has completed for 5 days.  Repeat chest x-ray 117 with low lung volumes.  Plan is to follow-up with chest CT in 6 to 8 weeks   Volume Overload bilateral pleural effusions , bilateral lower extremity edema /acute on Chronic Diastolic CHF, hypoalbuminemia 2D echocardiogram showed LV  ejection fraction of 60 to 65% with grade 1 diastolic dysfunction.  Venous duplex of the lower extremity without any DVT.  Continue IV Lasix and albumin.  On a Foley catheter.  Continue intake and output charting.  Hyponatremia Likely secondary to volume overload.  Has improved at this time at 141.  Continue to monitor BMP.  hypokalemia Replaced and improved.  Latest potassium of 3.7.  Abnormal LFTs/Transaminitis/ history of cirrhosis with portal hypertension -Right upper quadrant ultrasound consistent with cirrhosis with portal hypertension, low-density structure in the left lobe of the liver highlighted on prior noncontrast CTs up oropharynx.  Hepatitis panel was negative.  Could be secondary to liver congestion from volume overload.  On IV Lasix and albumin.  AKI on CKD stage IIIb Metabolic Acidosis, on bicarb Renal ultrasound was negative for hydronephrosis.  Urinalysis showed some possibility of UTI but no positive cultures.  Received antibiotic for pneumonia recently.  history of Breast Cancer S/p right mastectomy continue letrozole   New bone lytic lesions -CT imaging  on presentation showed bone lesions concerning for metastasis, lytic lesion in T1 vertebral body, left iliac wing lytic lesion and right acromion.  Patient underwent CT-guided biopsy with findings of metastatic carcinoma.  Follow MRI of the thoracic and lumbar scan as per radiation oncology.    Pancytopenia, likely multifactorial, h/o cirrhosis and currently concerning for metastatic cancer Being followed by heme oncology as outpatient setting.     Hypertension Losartan on hold.  Patient has been receiving IV albumin and Lasix during hospitalization.  Sinew to monitor.  Hyperlipidemia Statin and fenofibrate on hold due to elevated LFT.    Diabetes Mellitus Type 2, non-insulin-dependent, currently diet controlled Hemoglobin A1c of 4.9 on 03/16/2022.  Actos has been discontinued  due to edema.  Continue diet  control.  Hypothyroidism Continue Synthroid  Grade 1 obesity.Body mass index is 30.15 kg/m.  Would benefit from weight loss as outpatient.   Pressure injury, stage I medial buttocks, POA Continue pressure ulcer prevention protocol. Pressure Injury 03/14/22 Buttocks Medial;Mid Stage 1 -  Intact skin with non-blanchable redness of a localized area usually over a bony prominence. (Active)  03/14/22 2200  Location: Buttocks  Location Orientation: Medial;Mid  Staging: Stage 1 -  Intact skin with non-blanchable redness of a localized area usually over a bony prominence.  Wound Description (Comments):   Present on Admission: Yes     DVT prophylaxis: Place TED hose Start: 03/26/22 1847 SCDs Start: 03/14/22 2212 Place and maintain sequential compression device Start: 03/14/22 1706    Code Status: DNR  Family Communication:  None at bedside.    Disposition Plan:   Level of care: Med-Surg  Remains inpatient appropriate because: Volume overload, IV diuresis, pending skilled nursing facility placement   Consultants:  Medical oncology and radiation oncology General Surgery/Trauma Team Dr. Bobbye Morton Neurosurgery IR  Procedures:  Lytic iliac Wing Bone Marrow biopsy 03/25/2022  Antimicrobials:  Anti-infectives (From admission, onward)    Start     Dose/Rate Route Frequency Ordered Stop   03/15/22 1600  azithromycin (ZITHROMAX) 500 mg in sodium chloride 0.9 % 250 mL IVPB  Status:  Discontinued        500 mg 250 mL/hr over 60 Minutes Intravenous Every 24 hours 03/14/22 2211 03/17/22 1759   03/15/22 1000  cefTRIAXone (ROCEPHIN) 2 g in sodium chloride 0.9 % 100 mL IVPB        2 g 200 mL/hr over 30 Minutes Intravenous Every 24 hours 03/14/22 2211 03/20/22 1520   03/15/22 1000  oseltamivir (TAMIFLU) capsule 30 mg        30 mg Oral Daily 03/15/22 0910 03/19/22 1200   03/14/22 1530  cefTRIAXone (ROCEPHIN) 1 g in sodium chloride 0.9 % 100 mL IVPB        1 g 200 mL/hr over 30 Minutes  Intravenous  Once 03/14/22 1515 03/14/22 1606   03/14/22 1530  azithromycin (ZITHROMAX) 500 mg in sodium chloride 0.9 % 250 mL IVPB        500 mg 250 mL/hr over 60 Minutes Intravenous  Once 03/14/22 1515 03/14/22 1717       Subjective: Today, patient was seen and examined at bedside.  Patient feels constipated but had a bowel movement.  Feels nauseated.  Appetite okay as per the patient.  Denies any  vomiting fever chills or rigor.  No shortness of breath but has mild cough.  Objective: Vitals:   03/30/22 2040 03/31/22 0500 03/31/22 0601 03/31/22 1223  BP: (!) 114/46  (!) 121/41 (!) 119/46  Pulse: 90  97 79  Resp: (!) 22  (!) 23 17  Temp: 98.4 F (36.9 C)  98.9 F (37.2 C) 98 F (36.7 C)  TempSrc: Oral  Oral Oral  SpO2: 96%  95% 99%  Weight:  77.2 kg    Height:        Intake/Output Summary (Last 24 hours) at 03/31/2022 1451 Last data filed at 03/31/2022 1224 Gross per 24 hour  Intake 342.37 ml  Output 1800 ml  Net -1457.63 ml     Filed Weights   03/29/22 1100 03/30/22 0443 03/31/22 0500  Weight: 78.1 kg 77.3 kg 77.2 kg   Physical examination: General:  Average built, not in obvious distress, appears weak and deconditioned, HENT:  No scleral pallor or icterus noted. Oral mucosa is moist.  Chest:  Clear breath sounds.  Diminished breath sounds bilaterally. No crackles or wheezes.  Status post right mastectomy. CVS: S1 &S2 heard. No murmur.  Regular rate and rhythm. Abdomen: Soft, nontender, mildly distended abdomen.  Bowel sounds are heard.  Foley catheter in place Extremities: No cyanosis, clubbing with bilateral lower extremity pitting edema, peripheral pulses are palpable. Psych: Alert, awake and oriented, normal mood CNS:  No cranial nerve deficits.  Power equal in all extremities.   Skin: Warm and dry.  No rashes noted.   Data Reviewed: I have personally reviewed the following labs and imaging studies.  CBC: Recent Labs  Lab 03/25/22 1019 03/26/22 0413  03/28/22 0406 03/29/22 0450 03/30/22 0543 03/31/22 0420  WBC 7.3 5.7 3.6* 3.0* 2.4* 2.7*  NEUTROABS 5.7 4.6  --  2.2  --   --   HGB 9.0* 8.4* 7.7* 7.2* 7.1* 7.0*  HCT 27.8* 26.1* 24.4* 22.2* 21.9* 22.3*  MCV 104.5* 103.2* 107.0* 107.2* 108.4* 108.3*  PLT 121* 101* 82* 72* 69* 66*    Basic Metabolic Panel: Recent Labs  Lab 03/25/22 1019 03/26/22 0413 03/27/22 1002 03/28/22 0406 03/29/22 0450 03/30/22 0543 03/31/22 0420  NA 129* 132* 134* 136 137 140 141  K 3.8 3.6 3.5 3.8 3.9 3.7 3.7  CL 104 105 108 107 108 109 110  CO2 18* 19* 18* 17* 20* 24 22  GLUCOSE 85 87 103* 91 90 95 89  BUN 44* 45* 45* 43* 42* 43* 41*  CREATININE 2.06* 2.03* 2.07* 1.86* 1.78* 1.66* 1.76*  CALCIUM 7.9* 8.0* 7.9* 8.1* 8.3* 8.3* 8.7*  MG 1.9 1.9 1.8  --  2.3  --  2.0  PHOS 4.1 4.0  --   --  3.6  --   --     GFR: Estimated Creatinine Clearance: 25.1 mL/min (A) (by C-G formula based on SCr of 1.76 mg/dL (H)). Liver Function Tests: Recent Labs  Lab 03/26/22 0413 03/28/22 0406 03/29/22 0450 03/30/22 0543 03/31/22 0420  AST 156* 144* 135* 125* 134*  ALT 39 37 34 31 34  ALKPHOS 187* 203* 172* 185* 182*  BILITOT 1.8* 1.6* 1.8* 1.7* 2.0*  PROT 5.1* 5.5* 5.2* 5.5* 6.0*  ALBUMIN 2.1* 2.3* 2.3* 2.6* 2.9*    No results for input(s): "LIPASE", "AMYLASE" in the last 168 hours. No results for input(s): "AMMONIA" in the last 168 hours. Coagulation Profile: Recent Labs  Lab 03/25/22 0409  INR 1.4*    Cardiac Enzymes: Recent Labs  Lab 03/31/22 0420  CKTOTAL 26*   BNP (last 3 results) No results for input(s): "PROBNP" in the last 8760 hours. HbA1C: No results for input(s): "HGBA1C" in the last 72 hours. CBG: No results for input(s): "GLUCAP" in the last 168 hours. Lipid Profile: No results for input(s): "CHOL", "HDL", "LDLCALC", "TRIG", "CHOLHDL", "LDLDIRECT" in the last 72 hours. Thyroid Function Tests: No results for input(s): "TSH", "T4TOTAL", "FREET4", "T3FREE", "THYROIDAB" in the  last 72 hours. Anemia Panel: No results for input(s): "VITAMINB12", "FOLATE", "FERRITIN", "TIBC", "IRON", "RETICCTPCT" in the last 72 hours.  Sepsis Labs: Recent Labs  Lab 03/27/22 0407  LATICACIDVEN 2.4*     No results found for this or any previous visit (from the past 240 hour(s)).   Radiology Studies: MR TOTAL SPINE METS SCREENING  Result Date: 03/31/2022 CLINICAL DATA:  Provided history: Back pain. EXAM: MRI TOTAL SPINE WITHOUT AND WITH CONTRAST TECHNIQUE: Multisequence MR imaging of the spine from the cervical spine  to the sacrum was performed prior to and following IV contrast administration for evaluation of spinal metastatic disease. CONTRAST:  7.74m GADAVIST GADOBUTROL 1 MMOL/ML IV SOLN COMPARISON:  CT chest/abdomen/pelvis 03/14/2022. CT chest/abdomen/pelvis 02/28/2020. FINDINGS: MRI CERVICAL SPINE FINDINGS Alignment: Straightening of the expected cervical lordosis. No significant spondylolisthesis. Vertebrae: Multifocal marrow signal abnormality within the cervical spine compatible with widespread osseous metastatic disease (this is best appreciated on the sagittal T1-weighted sequence). Corresponding pathologic enhancement at multiple sites. No pathologic fracture is identified. No evidence of epidural tumorwithin the spinal canal. Cord: No appreciable signal abnormality or pathologic enhancement within the cervical spinal cord. Minimal spinal cord flattening at C4-C5, as described below. Posterior Fossa, vertebral arteries, paraspinal tissues: No acute finding within included portions of the posterior fossa. Vertebral arteries poorly assessed in the absence of axial T2 weighted imaging. No paraspinal mass or collection. Disc levels: Cervical spondylosis with multilevel disc degeneration, disc bulges/central disc protrusions, posterior disc osteophytes, uncovertebral hypertrophy and facet arthrosis. At C4-C5, a posterior disc osteophyte complex contributes to moderate spinal canal  stenosis. The disc osteophyte complex contacts and minimally flattens the ventral aspect of the spinal cord. No more than mild spinal canal narrowing at the remaining cervical levels. Multilevel foraminal stenosis, incompletely assessed on sagittal imaging. MRI THORACIC SPINE FINDINGS Alignment: No significant spondylolisthesis. Minimal bony retropulsion at the level of the T7 inferior endplate Vertebrae: Multifocal marrow signal abnormality within the thoracic spine compatible with widespread osseous metastatic disease (this is best appreciated on the sagittal T1 weighted sequences). Corresponding pathologic enhancement at multiple sites. T4 superior endplate compression fracture, with mild height loss and without bony retropulsion. Although chronic, this may reflect a pathologic fracture. T7 vertebral compression fracture (30-40% height loss). Minimal bony retropulsion at the level of the T7 inferior endplate. This likely reflects a pathologic compression fracture. There is mild edema within the T7 vertebral body, and this fracture may be subacute. Please refer to the recent prior CT chest/abdomen/pelvis for description of bilateral rib fractures. No enhancing epidural tumor is identified within the thoracic spinal canal. Cord: No signal abnormality or pathologic enhancement is appreciated within the spinal cord at the thoracic levels. There is mild prominence of the central canal of the mid to lower thoracic levels. Paraspinal and other soft tissues: Please refer to the recent prior CT chest/abdomen/pelvis for a description of thoracic and abdominopelvic soft tissue findings. No paraspinal mass or collection. Disc levels: Thoracic spondylosis with multilevel disc degeneration, disc bulges and facet arthrosis. At T9-T10, a small central disc protrusion mildly effaces the ventral thecal sac and contacts the ventral aspect of the spinal cord. No significant spinal canal stenosis at the remaining thoracic levels. No  compressive foraminal stenosis is identified. MRI LUMBAR SPINE FINDINGS Segmentation: 5 lumbar vertebrae. The caudal most well-formed intervertebral disc space is designated L5-S1. Alignment:  Significant spondylolisthesis. Vertebrae: Multifocal signal abnormality within the lumbar spine and visualized sacrum compatible with widespread osseous metastatic disease. Corresponding pathologic enhancement at multiple sites. L3 compression fracture (25% height loss). This fracture may be pathologic. There is mild edema and enhancement along the L3 superior endplate at this site, this fracture may be subacute. No epidural tumor is identified within the lumbar spinal canal. Conus medullaris: Extends to the L1 level and appears normal. Paraspinal and other soft tissues: Please refer to the recent prior CT chest/abdomen/pelvis for description of abdominopelvic soft tissue findings. No paraspinal mass or collection. Disc levels: Lumbar spondylosis with multilevel disc degeneration, disc bulges, facet arthrosis and ligamentum flavum  hypertrophy. Findings are most notably as follows. Disc degeneration is greatest to the left at L3-L4 (advanced at this site). L2-L3: Disc bulge. Facet arthrosis ligamentum flavum hypertrophy. Moderate spinal canal stenosis. Mild left neural foraminal narrowing. L3-L4: Disc bulge. Facet arthrosis and ligamentum flavum hypertrophy. Severe bilateral subarticular and central canal stenosis. Bilateral neural foraminal narrowing (mild right, mild/moderate left). L4-L5: Disc bulge. Facet arthrosis and ligamentum flavum hypertrophy. Right subarticular narrowing with medialization of the descending right L5 nerve root. Left subarticular narrowing with crowding of the descending left L5 nerve root. Mild right subarticular narrowing. Mild to moderate central canal stenosis. Moderate right neural foraminal narrowing. L5-S1: Disc bulge. Superimposed central disc protrusion. Facet arthrosis. The disc protrusion  contributes to mild bilateral subarticular narrowing (without frank nerve root impingement). No significant central canal or foraminal stenosis. IMPRESSION: Cervical spine: 1. Findings compatible with widespread osseous metastatic disease within the cervical spine. 2. No pathologic compression fracture. 3. No epidural tumor identified within the cervical spinal canal. 4. Cervical spondylosis, as described. Thoracic spine: 1. Findings compatible with widespread osseous metastatic disease within the thoracic spine. 2. T7 vertebral compression fracture (30-40% height loss). Minimal bony retropulsion at the level of the T7 inferior endplate, not resulting in significant spinal canal stenosis. This likely reflects a pathologic compression fracture. There is mild edema within the T7 vertebral body, and this fracture may be subacute. 3. Mild T4 superior endplate vertebral compression fracture. Although chronic, this fracture may be pathologic. 4. No epidural tumor identified within the thoracic spinal canal. 5. Thoracic spondylosis, as described. 6. Please refer to the recent prior CT chest/abdomen/pelvis for description of bilateral rib fractures. Lumbar spine: 1. Findings compatible with widespread osseous metastatic disease within the lumbar spine and sacrum. 2. L3 superior endplate vertebral compression fracture, with 25% height loss and without bony retropulsion. This fracture may be pathologic. Additionally, there is mild edema and enhancement along the L3 superior endplate at this site, this fracture may be subacute. 3. No epidural tumor identified within the lumbar spinal canal. 4. Lumbar spondylosis, as described. Notably, there is multifactorial severe bilateral subarticular and central canal stenosis at L3-L4. Electronically Signed   By: Kellie Simmering D.O.   On: 03/31/2022 13:43    Scheduled Meds:  calcium carbonate  1,250 mg Oral Q breakfast   Chlorhexidine Gluconate Cloth  6 each Topical Daily    cholecalciferol  1,000 Units Oral Daily   cyanocobalamin  1,000 mcg Oral Daily   feeding supplement (GLUCERNA SHAKE)  237 mL Oral TID BM   fluticasone  2 spray Each Nare Daily   folic acid  1 mg Oral Daily   furosemide  80 mg Intravenous BID   guaiFENesin  1,200 mg Oral BID   hydrocortisone   Rectal TID   letrozole  2.5 mg Oral QODAY   levothyroxine  112 mcg Oral Q0600   loratadine  10 mg Oral Daily   methocarbamol  1,000 mg Oral TID   pantoprazole  40 mg Oral Daily   potassium chloride  40 mEq Oral Daily   senna-docusate  1 tablet Oral BID   sodium bicarbonate  650 mg Oral BID   Continuous Infusions:  albumin human 12.5 g (03/31/22 0909)     LOS: 17 days   Oscar La, MD [facility] Available via Epic secure chat 7am-7pm After these hours, please refer to coverage provider listed on [email or web address] 03/31/2022, 2:51 PM

## 2022-03-31 NOTE — Progress Notes (Signed)
Mobility Specialist - Progress Note   03/31/22 1037  Mobility  Activity Ambulated with assistance in hallway  Level of Assistance Minimal assist, patient does 75% or more  Assistive Device Front wheel walker  Distance Ambulated (ft) 100 ft  Range of Motion/Exercises Active  Activity Response Tolerated well  Mobility Referral Yes  $Mobility charge 1 Mobility   Pt was found on recliner chair and agreeable to ambulate. Stated being fatigued prior to ambulation and throughout session. Had x2 seated rest breaks during ambulation for a couple seconds each, one at ~43f and another at ~755f At EOS returned to recliner chair with all necessities in reach.  BrFerd Hibbsobility Specialist

## 2022-04-01 ENCOUNTER — Ambulatory Visit
Admission: RE | Admit: 2022-04-01 | Discharge: 2022-04-01 | Disposition: A | Discharge status: Home / Self Care | Payer: Medicare HMO | Source: Ambulatory Visit | Attending: Radiation Oncology | Admitting: Radiation Oncology

## 2022-04-01 DIAGNOSIS — N179 Acute kidney failure, unspecified: Secondary | ICD-10-CM | POA: Diagnosis not present

## 2022-04-01 DIAGNOSIS — J189 Pneumonia, unspecified organism: Secondary | ICD-10-CM | POA: Diagnosis not present

## 2022-04-01 DIAGNOSIS — W19XXXD Unspecified fall, subsequent encounter: Secondary | ICD-10-CM | POA: Diagnosis not present

## 2022-04-01 DIAGNOSIS — C7951 Secondary malignant neoplasm of bone: Secondary | ICD-10-CM | POA: Diagnosis not present

## 2022-04-01 LAB — COMPREHENSIVE METABOLIC PANEL
ALT: 34 U/L (ref 0–44)
AST: 133 U/L — ABNORMAL HIGH (ref 15–41)
Albumin: 3 g/dL — ABNORMAL LOW (ref 3.5–5.0)
Alkaline Phosphatase: 174 U/L — ABNORMAL HIGH (ref 38–126)
Anion gap: 9 (ref 5–15)
BUN: 48 mg/dL — ABNORMAL HIGH (ref 8–23)
CO2: 23 mmol/L (ref 22–32)
Calcium: 8.6 mg/dL — ABNORMAL LOW (ref 8.9–10.3)
Chloride: 106 mmol/L (ref 98–111)
Creatinine, Ser: 1.75 mg/dL — ABNORMAL HIGH (ref 0.44–1.00)
GFR, Estimated: 29 mL/min — ABNORMAL LOW (ref >60)
Glucose, Bld: 98 mg/dL (ref 70–99)
Potassium: 3.8 mmol/L (ref 3.5–5.1)
Sodium: 138 mmol/L (ref 135–145)
Total Bilirubin: 2.3 mg/dL — ABNORMAL HIGH (ref 0.3–1.2)
Total Protein: 6.1 g/dL — ABNORMAL LOW (ref 6.5–8.1)

## 2022-04-01 LAB — CBC
HCT: 23.4 % — ABNORMAL LOW (ref 36.0–46.0)
Hemoglobin: 7.4 g/dL — ABNORMAL LOW (ref 12.0–15.0)
MCH: 34.6 pg — ABNORMAL HIGH (ref 26.0–34.0)
MCHC: 31.6 g/dL (ref 30.0–36.0)
MCV: 109.3 fL — ABNORMAL HIGH (ref 80.0–100.0)
Platelets: 65 10*3/uL — ABNORMAL LOW (ref 150–400)
RBC: 2.14 MIL/uL — ABNORMAL LOW (ref 3.87–5.11)
RDW: 22.5 % — ABNORMAL HIGH (ref 11.5–15.5)
WBC: 2.8 10*3/uL — ABNORMAL LOW (ref 4.0–10.5)
nRBC: 0 % (ref 0.0–0.2)

## 2022-04-01 LAB — MAGNESIUM: Magnesium: 1.9 mg/dL (ref 1.7–2.4)

## 2022-04-01 NOTE — TOC Progression Note (Addendum)
Transition of Care [facility]) - Progression Note    Patient Details  Name: Catherine Decker MRN: [id number] Date of Birth: [birth year]  Transition of Care [facility]) CM/SW [place], [place] Phone Number: 04/01/2022, 10:30 AM  Clinical Narrative:    Pt's auth was approved  3/76 - 2/83 certification # [id number] .TOC to follow.    Expected Discharge Plan: [place] Barriers to Discharge: Continued Medical Work up  Expected Discharge Plan and Services       Living arrangements for the past 2 months: Single Family Home                                       Social Determinants of Health (SDOH) Interventions SDOH Screenings   Food Insecurity: No Food Insecurity (03/14/2022)  Housing: Low Risk  (03/14/2022)  Transportation Needs: No Transportation Needs (03/14/2022)  Utilities: Not At Risk (03/14/2022)  Tobacco Use: Low Risk  (03/14/2022)    Readmission Risk Interventions     No data to display

## 2022-04-01 NOTE — Progress Notes (Signed)
Physical Therapy Treatment Patient Details Name: Catherine Decker MRN: [id number] DOB: 1940-11-01 Today's Date: 04/01/2022   History of Present Illness Patient is a 82 year old female who presented after a fall down some stairs. Patient was found to have left rib fractures 4-5, right rib fractures 7-8, T7 compression fx, T1 spinous process fx, SDH.  Pt also found to have influenza A. WNI:OEVOJ breast cancer, arthritis, hyperlipidemia, splenic artery aneurysm    PT Comments    Pt up in recliner upon arrival, reports being more tired today. Pt requests to stay in room, agreeable to exercises. Cued pt on seated exercises, good muscle activation, pt denies pain. Pt declines ambulation, visitors arrived to room.   Recommendations for follow up therapy are one component of a multi-disciplinary discharge planning process, led by the attending physician.  Recommendations may be updated based on patient status, additional functional criteria and insurance authorization.  Follow Up Recommendations  Skilled nursing-short term rehab (<3 hours/day) Can patient physically be transported by private vehicle: Yes   Assistance Recommended at Discharge Intermittent Supervision/Assistance  Patient can return home with the following A little help with walking and/or transfers;A little help with bathing/dressing/bathroom;Assistance with cooking/housework;Assist for transportation;Help with stairs or ramp for entrance   Equipment Recommendations  None recommended by PT    Recommendations for Other Services       Precautions / Restrictions Precautions Precautions: Fall Precaution Comments: rib fractures, back precautions Restrictions Weight Bearing Restrictions: No     Mobility  Bed Mobility  General bed mobility comments: in recliner upon arrival    Transfers   Ambulation/Gait    Stairs             Wheelchair Mobility    Modified Rankin (Stroke Patients Only)       Balance      Cognition Arousal/Alertness: Awake/alert Behavior During Therapy: WFL for tasks assessed/performed Overall Cognitive Status: Within Functional Limits for tasks assessed  General Comments: pt pleasant, reports being more tired today        Exercises General Exercises - Lower Extremity Ankle Circles/Pumps: Seated, AROM, Both, 10 reps Quad Sets: Seated, AROM, Strengthening, Both, 10 reps Gluteal Sets: Seated, AROM, Strengthening, Both, 10 reps Long Arc Quad: Seated, AROM, Strengthening, Both    General Comments        Pertinent Vitals/Pain Pain Assessment Pain Assessment: No/denies pain    Home Living                          Prior Function            PT Goals (current goals can now be found in the care plan section) Acute Rehab PT Goals PT Goal Formulation: With patient Time For Goal Achievement: 04/13/22 Potential to Achieve Goals: Good Progress towards PT goals: Progressing toward goals    Frequency    Min 2X/week      PT Plan Current plan remains appropriate    Co-evaluation              AM-PAC PT "6 Clicks" Mobility   Outcome Measure  Help needed turning from your back to your side while in a flat bed without using bedrails?: None Help needed moving from lying on your back to sitting on the side of a flat bed without using bedrails?: None Help needed moving to and from a bed to a chair (including a wheelchair)?: A Little Help needed standing up from a chair using your arms (  e.g., wheelchair or bedside chair)?: A Little Help needed to walk in hospital room?: A Little Help needed climbing 3-5 steps with a railing? : A Little 6 Click Score: 20    End of Session   Activity Tolerance: Patient tolerated treatment well;Patient limited by fatigue Patient left: in chair;with call bell/phone within reach;with family/visitor present Nurse Communication: Mobility status PT Visit Diagnosis: Difficulty in walking, not elsewhere classified  (R26.2);Unsteadiness on feet (R26.81);Muscle weakness (generalized) (M62.81)     Time: 8916-9450 PT Time Calculation (min) (ACUTE ONLY): 16 min  Charges:  $Therapeutic Exercise: 8-22 mins                      Tori Valena Ivanov PT, DPT 04/01/22, 11:48 AM

## 2022-04-01 NOTE — Consult Note (Cosign Needed)
Chief Complaint: Patient was seen in consultation today for thoracic 7/lumbar 3 biopsy/kyphoplasty/osteocool ablation.    Chief Complaint  Patient presents with   Fall   Diarrhea   Weakness   Cough    Referring Physician(s): Walford  Supervising Physician: Mir, Sharen Heck  Patient Status: [facility] - In-pt  History of Present Illness: ELARA COCKE is an 82 y.o. female with past medical history significant for anemia, arthritis, autoimmune deficiency syndrome, breast cancer with prior right mastectomy, cirrhosis, diabetes, GERD, CHF, chronic kidney disease, hyperlipidemia, hypertension, hypothyroidism, splenic artery aneurysm.  On 03/25/2022 she underwent left iliac crest lytic lesion biopsy which revealed metastatic carcinoma-immunohistochemical studies pending.  She presented to [facility] on 1/5 after sustaining a fall from her stairs.  Patient sustained multiple rib fractures, subdural hematoma as well as vertebral fractures.  Case was discussed with neurosurgery and trauma surgery and patient underwent conservative treatment during hospitalization.  She also tested positive for streptococcal pneumonia and influenza and has completed treatment.  She was also volume overloaded and has been on diuretic tx.  At this time patient is awaiting skilled nursing facility placement. She does have mild mid to lower back pain along with mild cough but no significant dyspnea, fever, chills, nausea, vomiting. She also has some rt sided chest discomfort from rib fxs. On oxycodone, robaxin, morphine prn. Occ rectal bleeding from hemorrhoids.  Patient being followed by radiation oncology for potential palliative radiation to spine.  Latest MRI total spine on 1/22 revealed : Cervical spine:   1. Findings compatible with widespread osseous metastatic disease within the cervical spine. 2. No pathologic compression fracture. 3. No epidural tumor identified within the cervical spinal canal. 4.  Cervical spondylosis, as described.   Thoracic spine:   1. Findings compatible with widespread osseous metastatic disease within the thoracic spine. 2. T7 vertebral compression fracture (30-40% height loss). Minimal bony retropulsion at the level of the T7 inferior endplate, not resulting in significant spinal canal stenosis. This likely reflects a pathologic compression fracture. There is mild edema within the T7 vertebral body, and this fracture may be subacute. 3. Mild T4 superior endplate vertebral compression fracture. Although chronic, this fracture may be pathologic. 4. No epidural tumor identified within the thoracic spinal canal. 5. Thoracic spondylosis, as described. 6. Please refer to the recent prior CT chest/abdomen/pelvis for description of bilateral rib fractures.   Lumbar spine:   1. Findings compatible with widespread osseous metastatic disease within the lumbar spine and sacrum. 2. L3 superior endplate vertebral compression fracture, with 25% height loss and without bony retropulsion. This fracture may be pathologic. Additionally, there is mild edema and enhancement along the L3 superior endplate at this site, this fracture may be subacute. 3. No epidural tumor identified within the lumbar spinal canal. 4. Lumbar spondylosis, as described. Notably, there is multifactorial severe bilateral subarticular and central canal stenosis at L3-L4.    Request now received for consideration of lumbar bx/kyphoplasty/osteocool ablation. She is currently afebrile, urine culture negative, blood culture negative.  WBC 2.8, hemoglobin 7.4, platelets 65k, creat 1.75, PT 17.2/INR 1.4.   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   Hyperlipidemia    Hypertension    Hypothyroidism    Splenic artery aneurysm ([employer])     Past Surgical History:  Procedure Laterality Date   APPENDECTOMY     BREAST LUMPECTOMY     Right breast lumpectomy   CATARACT EXTRACTION W/ INTRAOCULAR LENS  IMPLANT, BILATERAL  2018   CHOLECYSTECTOMY  04/2010   MASTECTOMY W/ SENTINEL NODE BIOPSY Right 09/07/2019   Procedure: RIGHT SIMPLE MASTECTOMY WITH SENTINEL LYMPH NODE MAPPING;  Surgeon: Erroll Luna, MD;  Location: [place];  Service: General;  Laterality: Right;  PEC BLOCK   SCAR REVISION Right 06/21/2020   Procedure: SCAR REVISION OF RIGHT MASTECTOMY;  Surgeon: Erroll Luna, MD;  Location: [place];  Service: General;  Laterality: Right;   TONSILLECTOMY     TUBAL LIGATION  1975    Allergies: Demerol, Meperidine, and Meperidine hcl  Medications: Prior to Admission medications   Medication Sig Start Date End Date Taking? Authorizing Provider  calcium carbonate (OS-CAL) 600 MG TABS tablet Take 600 mg by mouth daily with breakfast.   Yes [provider]  celecoxib (CELEBREX) 200 MG capsule Take 200 mg by mouth 2 (two) times daily.   Yes [provider]  cholecalciferol (VITAMIN D3) 25 MCG (1000 UNIT) tablet Take 1,000 Units by mouth daily.   Yes [provider]  Coenzyme Q10 (COQ10) 100 MG CAPS Take 1 capsule by mouth daily.   Yes [provider]  fenofibrate 160 MG tablet Take 160 mg by mouth daily.   Yes [provider]  folic acid (FOLVITE) 1 MG tablet Take 1 tablet (1 mg total) by mouth daily. 12/12/19  Yes Orson Slick, MD  Ginkgo Biloba 60 MG CAPS Take 60 mg by mouth daily.   Yes [provider]  letrozole (FEMARA) 2.5 MG tablet  Take 1 tablet (2.5 mg total) by mouth daily. 01/24/22  Yes Orson Slick, MD  levothyroxine (SYNTHROID) 112 MCG tablet Take 112 mcg by mouth daily before breakfast.   Yes [provider]  losartan (COZAAR) 25 MG tablet Take 12.5 mg by mouth daily.  04/25/19  Yes [provider]  Omega-3 Fatty Acids (FISH OIL) 600 MG CAPS Take 600 mg by mouth daily.   Yes [provider]  omeprazole (PRILOSEC) 20 MG capsule Take 20 mg by mouth daily as needed (acid reflux).    Yes [provider]  pioglitazone (ACTOS) 15 MG tablet Take 15 mg by mouth daily. 04/12/19  Yes [provider]  simvastatin (ZOCOR) 20 MG tablet Take 20 mg by mouth every evening.   Yes [provider]  vitamin B-12 (CYANOCOBALAMIN) 1000 MCG tablet Take 1,000 mcg by mouth daily.   Yes [provider]  Glucose Blood (ONETOUCH ULTRA BLUE VI) daily.    [provider]     Family History  Problem Relation Age of Onset   Diabetes Mother    Heart disease Mother    Hypertension Mother    Breast cancer Mother    Heart disease Father    Heart attack Father    Prostate cancer Father    Hypertension Sister    Varicose Veins Sister  Hyperlipidemia Sister    Colon cancer Sister    Heart disease Brother    Diabetes Brother    Hypertension Brother    Prostate cancer Brother    Cirrhosis Brother    Hypertension Brother    Hypertension Daughter    Hypertension Son    Liver cancer Neg Hx    Osteoporosis Neg Hx     Social History   Socioeconomic History   Marital status: Widowed    Spouse name: Not on file   Number of children: 3   Years of education: Not on file   Highest education level: Not on file  Occupational History   Occupation: retired   Occupation: retired    Comment: CNA  Tobacco Use   Smoking status: Never   Smokeless tobacco: Never  Vaping Use   Vaping Use: Never used  Substance and Sexual Activity   Alcohol use: No   Drug use: No    Sexual activity: Not on file  Other Topics Concern   Not on file  Social History Narrative   Lives with a friend.    Right handed    Lives in a two story home       Social Determinants of Health   Financial Resource Strain: Not on file  Food Insecurity: No Food Insecurity (03/14/2022)   Hunger Vital Sign    Worried About Running Out of Food in the Last Year: Never true    Ran Out of Food in the Last Year: Never true  Transportation Needs: No Transportation Needs (03/14/2022)   PRAPARE - Hydrologist (Medical): No    Lack of Transportation (Non-Medical): No  Physical Activity: Not on file  Stress: Not on file  Social Connections: Not on file      Review of Systems see above  Vital Signs: BP (!) 132/52 (BP Location: Left Arm)   Pulse 91   Temp 98.1 F (36.7 C) (Oral)   Resp 18   Ht '5\' 3"'$  (1.6 m)   Wt 164 lb 14.5 oz (74.8 kg)   SpO2 97%   BMI 29.21 kg/m      Physical Exam awake/alert; chest- CTA bilat; heart- RRR; abd- soft,+BS,NT; bilat LE edema noted; mild paravertebral tenderness L3/T7 regions  Imaging: MR TOTAL SPINE METS SCREENING  Result Date: 03/31/2022 CLINICAL DATA:  Provided history: Back pain. EXAM: MRI TOTAL SPINE WITHOUT AND WITH CONTRAST TECHNIQUE: Multisequence MR imaging of the spine from the cervical spine to the sacrum was performed prior to and following IV contrast administration for evaluation of spinal metastatic disease. CONTRAST:  7.9m GADAVIST GADOBUTROL 1 MMOL/ML IV SOLN COMPARISON:  CT chest/abdomen/pelvis 03/14/2022. CT chest/abdomen/pelvis 02/28/2020. FINDINGS: MRI CERVICAL SPINE FINDINGS Alignment: Straightening of the expected cervical lordosis. No significant spondylolisthesis. Vertebrae: Multifocal marrow signal abnormality within the cervical spine compatible with widespread osseous metastatic disease (this is best appreciated on the sagittal T1-weighted sequence). Corresponding pathologic enhancement at multiple  sites. No pathologic fracture is identified. No evidence of epidural tumorwithin the spinal canal. Cord: No appreciable signal abnormality or pathologic enhancement within the cervical spinal cord. Minimal spinal cord flattening at C4-C5, as described below. Posterior Fossa, vertebral arteries, paraspinal tissues: No acute finding within included portions of the posterior fossa. Vertebral arteries poorly assessed in the absence of axial T2 weighted imaging. No paraspinal mass or collection. Disc levels: Cervical spondylosis with multilevel disc degeneration, disc bulges/central disc protrusions, posterior disc osteophytes, uncovertebral hypertrophy and facet arthrosis. At C4-C5, a posterior  disc osteophyte complex contributes to moderate spinal canal stenosis. The disc osteophyte complex contacts and minimally flattens the ventral aspect of the spinal cord. No more than mild spinal canal narrowing at the remaining cervical levels. Multilevel foraminal stenosis, incompletely assessed on sagittal imaging. MRI THORACIC SPINE FINDINGS Alignment: No significant spondylolisthesis. Minimal bony retropulsion at the level of the T7 inferior endplate Vertebrae: Multifocal marrow signal abnormality within the thoracic spine compatible with widespread osseous metastatic disease (this is best appreciated on the sagittal T1 weighted sequences). Corresponding pathologic enhancement at multiple sites. T4 superior endplate compression fracture, with mild height loss and without bony retropulsion. Although chronic, this may reflect a pathologic fracture. T7 vertebral compression fracture (30-40% height loss). Minimal bony retropulsion at the level of the T7 inferior endplate. This likely reflects a pathologic compression fracture. There is mild edema within the T7 vertebral body, and this fracture may be subacute. Please refer to the recent prior CT chest/abdomen/pelvis for description of bilateral rib fractures. No enhancing  epidural tumor is identified within the thoracic spinal canal. Cord: No signal abnormality or pathologic enhancement is appreciated within the spinal cord at the thoracic levels. There is mild prominence of the central canal of the mid to lower thoracic levels. Paraspinal and other soft tissues: Please refer to the recent prior CT chest/abdomen/pelvis for a description of thoracic and abdominopelvic soft tissue findings. No paraspinal mass or collection. Disc levels: Thoracic spondylosis with multilevel disc degeneration, disc bulges and facet arthrosis. At T9-T10, a small central disc protrusion mildly effaces the ventral thecal sac and contacts the ventral aspect of the spinal cord. No significant spinal canal stenosis at the remaining thoracic levels. No compressive foraminal stenosis is identified. MRI LUMBAR SPINE FINDINGS Segmentation: 5 lumbar vertebrae. The caudal most well-formed intervertebral disc space is designated L5-S1. Alignment:  Significant spondylolisthesis. Vertebrae: Multifocal signal abnormality within the lumbar spine and visualized sacrum compatible with widespread osseous metastatic disease. Corresponding pathologic enhancement at multiple sites. L3 compression fracture (25% height loss). This fracture may be pathologic. There is mild edema and enhancement along the L3 superior endplate at this site, this fracture may be subacute. No epidural tumor is identified within the lumbar spinal canal. Conus medullaris: Extends to the L1 level and appears normal. Paraspinal and other soft tissues: Please refer to the recent prior CT chest/abdomen/pelvis for description of abdominopelvic soft tissue findings. No paraspinal mass or collection. Disc levels: Lumbar spondylosis with multilevel disc degeneration, disc bulges, facet arthrosis and ligamentum flavum hypertrophy. Findings are most notably as follows. Disc degeneration is greatest to the left at L3-L4 (advanced at this site). L2-L3: Disc bulge.  Facet arthrosis ligamentum flavum hypertrophy. Moderate spinal canal stenosis. Mild left neural foraminal narrowing. L3-L4: Disc bulge. Facet arthrosis and ligamentum flavum hypertrophy. Severe bilateral subarticular and central canal stenosis. Bilateral neural foraminal narrowing (mild right, mild/moderate left). L4-L5: Disc bulge. Facet arthrosis and ligamentum flavum hypertrophy. Right subarticular narrowing with medialization of the descending right L5 nerve root. Left subarticular narrowing with crowding of the descending left L5 nerve root. Mild right subarticular narrowing. Mild to moderate central canal stenosis. Moderate right neural foraminal narrowing. L5-S1: Disc bulge. Superimposed central disc protrusion. Facet arthrosis. The disc protrusion contributes to mild bilateral subarticular narrowing (without frank nerve root impingement). No significant central canal or foraminal stenosis. IMPRESSION: Cervical spine: 1. Findings compatible with widespread osseous metastatic disease within the cervical spine. 2. No pathologic compression fracture. 3. No epidural tumor identified within the cervical spinal canal. 4. Cervical spondylosis,  as described. Thoracic spine: 1. Findings compatible with widespread osseous metastatic disease within the thoracic spine. 2. T7 vertebral compression fracture (30-40% height loss). Minimal bony retropulsion at the level of the T7 inferior endplate, not resulting in significant spinal canal stenosis. This likely reflects a pathologic compression fracture. There is mild edema within the T7 vertebral body, and this fracture may be subacute. 3. Mild T4 superior endplate vertebral compression fracture. Although chronic, this fracture may be pathologic. 4. No epidural tumor identified within the thoracic spinal canal. 5. Thoracic spondylosis, as described. 6. Please refer to the recent prior CT chest/abdomen/pelvis for description of bilateral rib fractures. Lumbar spine: 1.  Findings compatible with widespread osseous metastatic disease within the lumbar spine and sacrum. 2. L3 superior endplate vertebral compression fracture, with 25% height loss and without bony retropulsion. This fracture may be pathologic. Additionally, there is mild edema and enhancement along the L3 superior endplate at this site, this fracture may be subacute. 3. No epidural tumor identified within the lumbar spinal canal. 4. Lumbar spondylosis, as described. Notably, there is multifactorial severe bilateral subarticular and central canal stenosis at L3-L4. Electronically Signed   By: Kellie Simmering D.O.   On: 03/31/2022 13:43   VAS [place] LOWER EXTREMITY VENOUS (DVT)  Result Date: 03/26/2022  Lower Venous DVT Study Patient Name:  MAILEE KLAAS  Date of Exam:   03/26/2022 Medical Rec #: [id number]        Accession #:    [id number] Date of Birth: Jul 29, 1940       Patient Gender: F Patient Age:   48 years Exam Location:  [facility] Procedure:      VAS [place] LOWER EXTREMITY VENOUS (DVT) Referring Phys: Annamaria Boots XU --------------------------------------------------------------------------------  Indications: Edema.  Limitations: Body habitus and poor ultrasound/tissue interface. Comparison Study: No previous exams Performing Technologist: Jody Hill RVT, RDMS  Examination Guidelines: A complete evaluation includes B-mode imaging, spectral Doppler, color Doppler, and power Doppler as needed of all accessible portions of each vessel. Bilateral testing is considered an integral part of a complete examination. Limited examinations for reoccurring indications may be performed as noted. The reflux portion of the exam is performed with the patient in reverse Trendelenburg.  +---------+---------------+---------+-----------+----------+-------------------+ RIGHT    CompressibilityPhasicitySpontaneityPropertiesThrombus Aging      +---------+---------------+---------+-----------+----------+-------------------+ CFV       Full           Yes      Yes                                      +---------+---------------+---------+-----------+----------+-------------------+ SFJ      Full                                                             +---------+---------------+---------+-----------+----------+-------------------+ FV Prox  Full           Yes      Yes                                      +---------+---------------+---------+-----------+----------+-------------------+ FV Mid   Full           Yes  Yes                                      +---------+---------------+---------+-----------+----------+-------------------+ FV DistalFull           Yes      Yes                                      +---------+---------------+---------+-----------+----------+-------------------+ PFV      Full                                                             +---------+---------------+---------+-----------+----------+-------------------+ POP      Full           Yes      Yes                                      +---------+---------------+---------+-----------+----------+-------------------+ PTV                                                   Not well visualized +---------+---------------+---------+-----------+----------+-------------------+ PERO                                                  Not well visualized +---------+---------------+---------+-----------+----------+-------------------+   +---------+---------------+---------+-----------+----------+--------------+ LEFT     CompressibilityPhasicitySpontaneityPropertiesThrombus Aging +---------+---------------+---------+-----------+----------+--------------+ CFV      Full           Yes      Yes                                 +---------+---------------+---------+-----------+----------+--------------+ SFJ      Full                                                         +---------+---------------+---------+-----------+----------+--------------+ FV Prox  Full           Yes      Yes                                 +---------+---------------+---------+-----------+----------+--------------+ FV Mid   Full           Yes      Yes                                 +---------+---------------+---------+-----------+----------+--------------+ FV DistalFull           Yes      Yes                                 +---------+---------------+---------+-----------+----------+--------------+  PFV      Full                                                        +---------+---------------+---------+-----------+----------+--------------+ POP      Full           Yes      Yes                                 +---------+---------------+---------+-----------+----------+--------------+ PTV      Full                                                        +---------+---------------+---------+-----------+----------+--------------+ PERO     Full                                                        +---------+---------------+---------+-----------+----------+--------------+     Summary: BILATERAL: - No evidence of deep vein thrombosis seen in the lower extremities, bilaterally. -No evidence of popliteal cyst, bilaterally. -Diffuse subcutaneous edema seen bilaterally.  *See table(s) above for measurements and observations. Electronically signed by Harold Barban MD on 03/26/2022 at 8:33:56 PM.    Final    DG CHEST PORT 1 VIEW  Result Date: 03/26/2022 CLINICAL DATA:  Shortness of breath EXAM: PORTABLE CHEST 1 VIEW COMPARISON:  Chest x-ray March 24, 2022 FINDINGS: The cardiomediastinal silhouette is unchanged in contour. Low lung volumes with bronchovascular crowding. The left costophrenic angle is excluded from the field of view. No focal pulmonary opacity. No pleural effusion or pneumothorax. The visualized upper abdomen is unremarkable. Redemonstrated multiple  displaced bilateral rib fractures. IMPRESSION: No acute cardiopulmonary abnormality. Electronically Signed   By: Beryle Flock M.D.   On: 03/26/2022 08:18   CT BIOPSY  Result Date: 03/25/2022 INDICATION: 82 year old female with a history iliac bone lesion referred for biopsy EXAM: CT BIOPSY MEDICATIONS: None. ANESTHESIA/SEDATION: Moderate (conscious) sedation was employed during this procedure. A total of Versed 1.0 mg and Fentanyl 50 mcg was administered intravenously. Moderate Sedation Time: 11 minutes. The patient's level of consciousness and vital signs were monitored continuously by radiology nursing throughout the procedure under my direct supervision. FLUOROSCOPY TIME:  CT COMPLICATIONS: None PROCEDURE: Informed written consent was obtained from the patient after a thorough discussion of the procedural risks, benefits and alternatives. All questions were addressed. Maximal Sterile Barrier Technique was utilized including caps, mask, sterile gowns, sterile gloves, sterile drape, hand hygiene and skin antiseptic. A timeout was performed prior to the initiation of the procedure. Patient positioned supine on the CT gantry table. Scout CT acquired for planning purposes. The patient is prepped and draped in the usual sterile fashion. 1% lidocaine was used for local anesthesia. Using CT guidance, guide needle was advanced into the lytic lesion of the left iliac crest. Once we confirmed needle tip position, multiple 18 gauge core biopsy were acquired. Specimen placed into formalin. Patient tolerated the procedure well and remained  hemodynamically stable throughout. No complications were encountered and no significant blood loss. IMPRESSION: Status post CT-guided biopsy of left iliac crest lytic lesion. Signed, Dulcy Fanny. Nadene Rubins, RPVI [facility] Radiology Electronically Signed   By: Corrie Mckusick D.O.   On: 03/25/2022 16:21   DG CHEST PORT 1  VIEW  Result Date: 03/24/2022 CLINICAL DATA:  Shortness of breath. EXAM: PORTABLE CHEST 1 VIEW COMPARISON:  03/22/2022 FINDINGS: Lungs are adequately inflated without focal airspace consolidation or effusion. Cardiomediastinal silhouette is normal. Surgical clips over the right axilla. Talus device overlies the right upper quadrant. There are multiple displaced left lateral rib fractures as well as displaced right lateral rib fractures unchanged. IMPRESSION: 1. No acute cardiopulmonary disease. 2. Multiple displaced bilateral rib fractures unchanged. Electronically Signed   By: Marin Olp M.D.   On: 03/24/2022 08:19   DG CHEST PORT 1 VIEW  Result Date: 03/22/2022 CLINICAL DATA:  Shortness of breath, cough. EXAM: PORTABLE CHEST 1 VIEW COMPARISON:  Chest x-rays dated 03/21/2022 and 03/20/2022 chest CT dated 03/14/2022. FINDINGS: Persistent airspace opacities at the bilateral lung bases, atelectasis and/or small pleural effusions, but improved aeration at the lung bases compared to the recent chest x-rays. No new lung findings. No pneumothorax is seen. Heart size and mediastinal contours appear stable. IMPRESSION: Improved aeration at the bilateral lung bases. Persistent airspace opacities at the lung bases, likely decreased atelectasis and/or small pleural effusions. Electronically Signed   By: Franki Cabot M.D.   On: 03/22/2022 09:40   ECHOCARDIOGRAM COMPLETE  Result Date: 03/22/2022    ECHOCARDIOGRAM REPORT   Patient Name:   RENE GONSOULIN Date of Exam: 03/22/2022 Medical Rec #:  [id number]       Height:       63.0 in Accession #:    [id number]      Weight:       171.3 lb Date of Birth:  1941/01/06      BSA:          1.810 m Patient Age:    5 years        BP:           135/58 mmHg Patient Gender: F               HR:           66 bpm. Exam Location:  Inpatient Procedure: 2D Echo, Cardiac Doppler and Color Doppler Indications:    Bacteremia  History:        Patient has no prior history of Echocardiogram  examinations.                 Risk Factors:Diabetes, Hypertension and Dyslipidemia. FLU                 positive. Breast cancer 2021. SDH.  Sonographer:    Roseanna Rainbow RDCS Referring Phys: [id number] [facility] LAT[facility]  Sonographer Comments: Suboptimal parasternal window and suboptimal subcostal window. Could not turn due to multiple rib fractures. IMPRESSIONS  1. Left ventricular ejection fraction, by estimation, is 60 to 65%. The left ventricle has normal function. The left ventricle has no regional wall motion abnormalities. There is mild left ventricular hypertrophy. Left ventricular diastolic parameters are consistent with Grade I diastolic dysfunction (impaired relaxation). Elevated left ventricular end-diastolic pressure.  2. Right ventricular systolic function is normal. The right ventricular size is normal.  3. The mitral valve is abnormal. No evidence of mitral valve regurgitation. No evidence of mitral  stenosis.  4. The aortic valve is tricuspid. Aortic valve regurgitation is not visualized. No aortic stenosis is present.  5. The inferior vena cava is normal in size with greater than 50% respiratory variability, suggesting right atrial pressure of 3 mmHg. FINDINGS  Left Ventricle: Left ventricular ejection fraction, by estimation, is 60 to 65%. The left ventricle has normal function. The left ventricle has no regional wall motion abnormalities. The left ventricular internal cavity size was normal in size. There is  mild left ventricular hypertrophy. Left ventricular diastolic parameters are consistent with Grade I diastolic dysfunction (impaired relaxation). Elevated left ventricular end-diastolic pressure. Right Ventricle: The right ventricular size is normal. No increase in right ventricular wall thickness. Right ventricular systolic function is normal. Left Atrium: Left atrial size was normal in size. Right Atrium: Right atrial size was normal in size. Pericardium: There is no evidence of pericardial  effusion. Mitral Valve: The mitral valve is abnormal. There is mild thickening of the mitral valve leaflet(s). Mild mitral annular calcification. No evidence of mitral valve regurgitation. No evidence of mitral valve stenosis. Tricuspid Valve: The tricuspid valve is normal in structure. Tricuspid valve regurgitation is mild . No evidence of tricuspid stenosis. Aortic Valve: The aortic valve is tricuspid. Aortic valve regurgitation is not visualized. No aortic stenosis is present. Pulmonic Valve: The pulmonic valve was normal in structure. Pulmonic valve regurgitation is mild. No evidence of pulmonic stenosis. Aorta: The aortic root is normal in size and structure. Venous: The inferior vena cava is normal in size with greater than 50% respiratory variability, suggesting right atrial pressure of 3 mmHg. IAS/Shunts: No atrial level shunt detected by color flow Doppler.  LEFT VENTRICLE PLAX 2D LVIDd:         4.30 cm   Diastology LVIDs:         2.25 cm   LV e' medial:    6.31 cm/s LV PW:         1.10 cm   LV E/e' medial:  14.5 LV IVS:        1.30 cm   LV e' lateral:   6.31 cm/s LVOT diam:     2.30 cm   LV E/e' lateral: 14.5 LV SV:         125 LV SV Index:   69 LVOT Area:     4.15 cm  RIGHT VENTRICLE             IVC RV S prime:     12.20 cm/s  IVC diam: 1.50 cm TAPSE (M-mode): 2.2 cm LEFT ATRIUM             Index        RIGHT ATRIUM           Index LA diam:        3.40 cm 1.88 cm/m   RA Area:     11.10 cm LA Vol (A2C):   29.9 ml 16.52 ml/m  RA Volume:   19.60 ml  10.83 ml/m LA Vol (A4C):   24.2 ml 13.37 ml/m LA Biplane Vol: 26.2 ml 14.47 ml/m  AORTIC VALVE LVOT Vmax:   145.00 cm/s LVOT Vmean:  104.000 cm/s LVOT VTI:    0.302 m  AORTA Ao Root diam: 3.70 cm Ao Asc diam:  3.30 cm MITRAL VALVE MV Area (PHT): 3.42 cm    SHUNTS MV Decel Time: 222 msec    Systemic VTI:  0.30 m MV E velocity: 91.80 cm/s  Systemic Diam: 2.30 cm MV A  velocity: 95.50 cm/s MV E/A ratio:  0.96 Jenkins Rouge MD Electronically signed by Jenkins Rouge MD Signature Date/Time: 03/22/2022/9:30:38 AM    Final    DG CHEST PORT 1 VIEW  Result Date: 03/21/2022 CLINICAL DATA:  Shortness of breath EXAM: PORTABLE CHEST 1 VIEW COMPARISON:  Chest radiograph dated 03/20/2022 FINDINGS: Low lung volumes with similar bibasilar and right lower linear opacities. Similar small bilateral pleural effusions. No pneumothorax. Similar cardiomediastinal silhouette. The visualized skeletal structures are unremarkable. IMPRESSION: 1. Similar small bilateral pleural effusions. 2. Low lung volumes with similar bibasilar and right lower linear opacities, likely atelectasis. Electronically Signed   By: Darrin Nipper M.D.   On: 03/21/2022 08:36   DG CHEST PORT 1 VIEW  Result Date: 03/20/2022 CLINICAL DATA:  [id number] with shortness of breath. EXAM: PORTABLE CHEST 1 VIEW COMPARISON:  Portable chest and chest CT without contrast both 03/14/2022 FINDINGS: 4:57 a.m. The heart has mildly enlarged in the interval. There is now mild perihilar vascular congestion and lower zonal interstitial edema consistent with CHF or fluid overload. Small pleural effusions have also increased in size, with increasing overlying linear and hazy opacities consistent with atelectasis or pneumonia versus ground-glass edema. The mid and upper lung fields are clear of focal opacities. There are right axillary surgical clips. The mediastinum is normally outlined. There is calcification of the transverse aorta. Degenerative change thoracic spine. IMPRESSION: 1. Mild cardiomegaly with perihilar vascular congestion and lower zonal interstitial edema consistent with CHF or fluid overload. 2. Small pleural effusions have increased in size, with increasing overlying linear and hazy opacities consistent with atelectasis or pneumonia versus ground-glass edema. 3. Clinical correlation and radiographic follow-up recommended. Electronically Signed   By: Telford Nab M.D.   On: 03/20/2022 07:47   US Abdomen Limited RUQ  (LIVER/GB)  Result Date: 03/15/2022 CLINICAL DATA:  Elevated liver function tests EXAM: ULTRASOUND ABDOMEN LIMITED RIGHT UPPER QUADRANT COMPARISON:  Abdominal CT from yesterday FINDINGS: Gallbladder: History of cholecystectomy Common bile duct: Diameter: 5 mm Liver: Heterogeneous parenchyma with lobulation from known cirrhosis. There is history of breast cancer but constellation of findings suggest long term cirrhosis rather than pseudocirrhosis. The low-density area highlighted on prior study is an aneurysmal vessel there is contiguous with the portal system on clips, up to 3.6 cm. No gross solid mass lesion. No portal venous occlusion or reversal noted. IMPRESSION: Cirrhosis with portal hypertension. The low-density structure in the left lobe liver highlighted on prior noncontrast CT is a portal varix. Electronically Signed   By: Jorje Guild M.D.   On: 03/15/2022 08:00   US RENAL  Result Date: 03/14/2022 CLINICAL DATA:  AKI EXAM: RENAL / URINARY TRACT ULTRASOUND COMPLETE COMPARISON:  CT chest abdomen and pelvis March 14, 2022 FINDINGS: Right Kidney: Renal measurements: 9.3 x 4.8 x 4.3 cm = volume: 98.8 mL. Renal cortical thinning. Echogenicity within normal limits. No mass or hydronephrosis visualized. Left Kidney: Renal measurements: 9.1 x 4.0 x 3.8 cm = volume: 73.1 mL. Renal cortical thinning. Echogenicity within normal limits. No mass or hydronephrosis visualized. Bladder: Appears normal for degree of bladder distention. Other: None. IMPRESSION: 1. Bilateral renal cortical thinning, which can be seen in the setting of medical renal disease. 2. No hydronephrosis. Electronically Signed   By: Beryle Flock M.D.   On: 03/14/2022 18:35   CT CHEST ABDOMEN PELVIS WO CONTRAST  Result Date: 03/14/2022 CLINICAL DATA:  Fall yesterday. Rib pain. Prior mastectomy for right breast cancer. Known cirrhosis. * Tracking Code: BO * EXAM:  CT CHEST, ABDOMEN AND PELVIS WITHOUT CONTRAST TECHNIQUE: Multidetector CT  imaging of the chest, abdomen and pelvis was performed following the standard protocol without IV contrast. RADIATION DOSE REDUCTION: This exam was performed according to the departmental dose-optimization program which includes automated exposure control, adjustment of the mA and/or kV according to patient size and/or use of iterative reconstruction technique. COMPARISON:  02/28/2020 FINDINGS: CT CHEST FINDINGS Cardiovascular: Mild degradation secondary to arm position, not raised above the head. Lack of IV contrast. Aortic atherosclerosis. Tortuous thoracic aorta. Normal heart size, without pericardial effusion. Lad coronary artery calcification. Mediastinum/Nodes: Right axillary node dissection. No axillary or subpectoral adenopathy. Calcified mediastinal and right hilar nodes are likely related to old granulomatous disease. No mediastinal or hilar adenopathy, given limitations of unenhanced CT. Lungs/Pleura: No pleural fluid.  Mild left base scarring. Inferior right middle lobe and dependent right lower lobe areas of consolidation. Peribronchovascular nodularity within the more central right middle and right lower lobes. Example 9 mm in the right lower lobe on 81/5. These areas are all new since 02/28/2020. No pneumothorax. Musculoskeletal: Right mastectomy. Osteopenia. Minimally displaced fourth and fifth lateral left rib fractures including on 17 and 21 of series 3 are acute. The seventh lateral right rib fracture is new in the interval but partially healed. An eighth posterolateral right rib fracture is displaced, of indeterminate acuity but unhealed on 39/3. Mild superior endplate compression deformity at T4 is new in the interval. A moderate T7 compression deformity is without ventral canal encroachment and demonstrates heterogeneous increased density within. Heterogeneous increased density within the right acromion on 05/03. CT ABDOMEN PELVIS FINDINGS Hepatobiliary: Advanced cirrhosis. Left hepatic lobe  hypoattenuation centrally could represent an enlarged left portal vein when correlated with prior contrast enhanced exam but is indeterminate on 55/3. Cholecystectomy, without biliary ductal dilatation. Pancreas: Normal, without mass or ductal dilatation. Spleen: Splenomegaly at 13.9 cm craniocaudal. Old granulomatous disease within. Adrenals/Urinary Tract: Normal adrenal glands. Mild renal cortical thinning bilaterally. No hydronephrosis. Normal urinary bladder. Stomach/Bowel: Proximal gastric underdistention. Extensive colonic diverticulosis. Sigmoid wall thickening is typical of muscular hypertrophy. Normal terminal ileum and appendix. Normal small bowel. Vascular/Lymphatic: Advanced aortic and branch vessel atherosclerosis. Splenic artery aneurysm of 1.2 cm is not significantly changed. Portal venous hypertension, with a recanalized paraumbilical vein and abdominal wall collaterals. No abdominopelvic adenopathy. Reproductive: Hysterectomy.  No adnexal mass. Other: Small volume abdominopelvic fluid, new. No free intraperitoneal air. Musculoskeletal: Osteopenia. Left iliac lytic lesion of 2.6 cm on 88/3, new. A mild superior endplate compression deformity at L3 is of indeterminate acuity. IMPRESSION: 1. Low sensitivity exam secondary to lack of oral or IV contrast and patient arm position, not raised above the head. 2. Osseous metastasis, most apparent as a lytic lesion in the left iliac wing. Suspicion of right acromion process metastasis as well. 3. Bilateral rib fractures, acute on the left and of indeterminate acuity on the right. 4. Thoracolumbar compression deformities, new since 02/27/2021,. Of indeterminate acuity. Cannot exclude pathologic fracture, including at T7. Consider nonemergent pre and postcontrast thoracic and possibly lumbar spine MRI. 5. Right lower and right middle lobe consolidation, favoring pneumonia. Concurrent peribronchovascular nodularity which is also most likely infectious. Somewhat  atypical appearance of pulmonary metastasis or primary bronchogenic carcinoma felt less likely. Recommend follow-up on chest CT at 6-8 weeks. 6. Cirrhosis and portal venous hypertension with new small volume abdominopelvic ascites. Hypoattenuation in the central left lower lobe could represent an enlarged left portal vein but is indeterminate on this noncontrast exam. This could  be further evaluated with nonemergent pre and post contrast abdominal MRI. Alternatively, if the patient is eventually scheduled for PET, this would likely be informative. 7. Similar 12 mm splenic artery aneurysm. 8. Incidental findings, including: Coronary artery atherosclerosis. Aortic Atherosclerosis (ICD10-I70.0). Electronically Signed   By: Abigail Miyamoto M.D.   On: 03/14/2022 14:49   CT Head Wo Contrast  Result Date: 03/14/2022 CLINICAL DATA:  Status post fall EXAM: CT HEAD WITHOUT CONTRAST CT CERVICAL SPINE WITHOUT CONTRAST TECHNIQUE: Multidetector CT imaging of the head and cervical spine was performed following the standard protocol without intravenous contrast. Multiplanar CT image reconstructions of the cervical spine were also generated. RADIATION DOSE REDUCTION: This exam was performed according to the departmental dose-optimization program which includes automated exposure control, adjustment of the mA and/or kV according to patient size and/or use of iterative reconstruction technique. COMPARISON:  None Available. FINDINGS: CT HEAD FINDINGS Brain: Bilateral low-attenuation sub dural fluid collections overlie the cerebral image spheres compatible with chronic subdural hygromas. Overlying the right frontoparietal convexity there is a 4 mm intermediate density subpleural fluid subdural fluid collection which measures Hounsfield units between 50 and 60, image 28/6 and image 26/5. No definite hyperdense subdural fluid collections identified. There is no significant midline shift or mass effect. No signs of acute brain infarct.  There is mild diffuse low-attenuation within the subcortical and periventricular white matter compatible with chronic microvascular disease. Vascular: No hyperdense vessel or unexpected calcification. Skull: Normal. Negative for fracture or focal lesion. Sinuses/Orbits: No acute finding. Other: None CT CERVICAL SPINE FINDINGS Alignment: Normal. Skull base and vertebrae: There is an acute mildly displaced fracture involving the posterior spinous process T1, image 71/5. Lucent lesion is identified within the posterior aspect of the T1 vertebral body measuring 1.4 cm, image 73/5. Soft tissues and spinal canal: No prevertebral fluid or swelling. No visible canal hematoma. Disc levels: Multilevel disc space narrowing and endplate spurring noted within the cervical spine. Upper chest: No pneumothorax identified within the lung apices. Permeative appearance of the right coracoid process of the right scapula, image 79/5. Other: None IMPRESSION: 1. Bilateral low-attenuation sub dural fluid collections overlie the cerebral image spheres compatible with chronic subdural hygromas. 2. Overlying the right frontoparietal convexity there is a 4 mm intermediate density subpleural fluid collection which measures Hounsfield units between 50 and 60. This is favored to represent a subacute subdural hematoma. 3. Acute mildly displaced fracture involving the posterior spinous process of T1. 4. Lucent lesion is identified within the posterior aspect of the T1 vertebral body measuring 1.4 cm. Permeative appearance of the right coracoid process of the right scapula. Findings are concerning for metastatic disease. 5. Chronic small vessel ischemic disease and brain atrophy. Critical Value/emergent results were called by telephone at the time of interpretation on 03/14/2022 at 2:43 pm to provider [facility] , who verbally acknowledged these results. Electronically Signed   By: Kerby Moors M.D.   On: 03/14/2022 14:44   CT Cervical  Spine Wo Contrast  Result Date: 03/14/2022 CLINICAL DATA:  Status post fall EXAM: CT HEAD WITHOUT CONTRAST CT CERVICAL SPINE WITHOUT CONTRAST TECHNIQUE: Multidetector CT imaging of the head and cervical spine was performed following the standard protocol without intravenous contrast. Multiplanar CT image reconstructions of the cervical spine were also generated. RADIATION DOSE REDUCTION: This exam was performed according to the departmental dose-optimization program which includes automated exposure control, adjustment of the mA and/or kV according to patient size and/or use of iterative reconstruction technique. COMPARISON:  None Available.  FINDINGS: CT HEAD FINDINGS Brain: Bilateral low-attenuation sub dural fluid collections overlie the cerebral image spheres compatible with chronic subdural hygromas. Overlying the right frontoparietal convexity there is a 4 mm intermediate density subpleural fluid subdural fluid collection which measures Hounsfield units between 50 and 60, image 28/6 and image 26/5. No definite hyperdense subdural fluid collections identified. There is no significant midline shift or mass effect. No signs of acute brain infarct. There is mild diffuse low-attenuation within the subcortical and periventricular white matter compatible with chronic microvascular disease. Vascular: No hyperdense vessel or unexpected calcification. Skull: Normal. Negative for fracture or focal lesion. Sinuses/Orbits: No acute finding. Other: None CT CERVICAL SPINE FINDINGS Alignment: Normal. Skull base and vertebrae: There is an acute mildly displaced fracture involving the posterior spinous process T1, image 71/5. Lucent lesion is identified within the posterior aspect of the T1 vertebral body measuring 1.4 cm, image 73/5. Soft tissues and spinal canal: No prevertebral fluid or swelling. No visible canal hematoma. Disc levels: Multilevel disc space narrowing and endplate spurring noted within the cervical spine.  Upper chest: No pneumothorax identified within the lung apices. Permeative appearance of the right coracoid process of the right scapula, image 79/5. Other: None IMPRESSION: 1. Bilateral low-attenuation sub dural fluid collections overlie the cerebral image spheres compatible with chronic subdural hygromas. 2. Overlying the right frontoparietal convexity there is a 4 mm intermediate density subpleural fluid collection which measures Hounsfield units between 50 and 60. This is favored to represent a subacute subdural hematoma. 3. Acute mildly displaced fracture involving the posterior spinous process of T1. 4. Lucent lesion is identified within the posterior aspect of the T1 vertebral body measuring 1.4 cm. Permeative appearance of the right coracoid process of the right scapula. Findings are concerning for metastatic disease. 5. Chronic small vessel ischemic disease and brain atrophy. Critical Value/emergent results were called by telephone at the time of interpretation on 03/14/2022 at 2:43 pm to provider [facility] , who verbally acknowledged these results. Electronically Signed   By: Kerby Moors M.D.   On: 03/14/2022 14:44   DG Chest 2 View  Result Date: 03/14/2022 CLINICAL DATA:  Fall, rib pain, cough, and shortness of breath. EXAM: CHEST - 2 VIEW COMPARISON:  Chest radiographs 08/07/2009 and CT 02/28/2020 FINDINGS: The cardiomediastinal silhouette is unchanged with normal heart size. The lungs are hyperinflated. New patchy opacity is present in the medial right lung base. The left lung is clear. There could be trace pleural effusions. No pneumothorax is identified. There is a mildly displaced posterolateral right eighth rib fracture, and a non or minimally displaced seventh rib fracture is also suspected. Surgical clips are noted in the right axilla. There is a midthoracic vertebral compression fracture with moderate vertebral body height loss which is new from 2021 and of indeterminate age.  IMPRESSION: 1. Right seventh and eighth rib fractures. No pneumothorax. 2. New medial right basilar airspace opacity suspicious for pneumonia. Followup PA and lateral chest radiographs are recommended in 3-4 weeks following trial of antibiotic therapy to ensure resolution and exclude underlying malignancy. 3. Possible trace pleural effusions. 4. Age-indeterminate midthoracic vertebral compression fracture. Electronically Signed   By: Logan Bores M.D.   On: 03/14/2022 12:02    Labs:  CBC: Recent Labs    03/29/22 0450 03/30/22 0543 03/31/22 0420 04/01/22 0410  WBC 3.0* 2.4* 2.7* 2.8*  HGB 7.2* 7.1* 7.0* 7.4*  HCT 22.2* 21.9* 22.3* 23.4*  PLT 72* 69* 66* 65*    COAGS: Recent Labs  03/25/22 0409  INR 1.4*    BMP: Recent Labs    03/29/22 0450 03/30/22 0543 03/31/22 0420 04/01/22 0410  NA 137 140 141 138  K 3.9 3.7 3.7 3.8  CL 108 109 110 106  CO2 20* '24 22 23  '$ GLUCOSE 90 95 89 98  BUN 42* 43* 41* 48*  CALCIUM 8.3* 8.3* 8.7* 8.6*  CREATININE 1.78* 1.66* 1.76* 1.75*  GFRNONAA 29* 31* 29* 29*    LIVER FUNCTION TESTS: Recent Labs    03/29/22 0450 03/30/22 0543 03/31/22 0420 04/01/22 0410  BILITOT 1.8* 1.7* 2.0* 2.3*  AST 135* 125* 134* 133*  ALT 34 31 34 34  ALKPHOS 172* 185* 182* 174*  PROT 5.2* 5.5* 6.0* 6.1*  ALBUMIN 2.3* 2.6* 2.9* 3.0*    TUMOR MARKERS: No results for input(s): "AFPTM", "CEA", "CA199", "CHROMGRNA" in the last 8760 hours.  Assessment and Plan: 82 y.o. female with past medical history significant for anemia, arthritis, autoimmune deficiency syndrome, breast cancer with prior right mastectomy, cirrhosis, diabetes, GERD, CHF, chronic kidney disease, hyperlipidemia, hypertension, hypothyroidism, splenic artery aneurysm.  On 03/25/2022 she underwent left iliac crest lytic lesion biopsy which revealed metastatic carcinoma-immunohistochemical studies pending.  She [facility] 1/[facility] after sustaining a fall from her stairs.   Patient sustained multiple rib fractures, subdural hematoma as well as vertebral fractures.  Case was discussed with neurosurgery and trauma surgery and patient underwent conservative treatment during hospitalization.  She also tested positive for streptococcal pneumonia and influenza and has completed treatment.  She was also volume overloaded and has been on diuretic tx.  At this time patient is awaiting skilled nursing facility placement. She does have mild mid to lower back pain along with mild cough but no significant dyspnea, fever, chills, nausea, vomiting. She also has some rt sided chest discomfort from rib fxs. On oxycodone, robaxin, morphine prn. Occ rectal bleeding from hemorrhoids.  Patient being followed by radiation oncology for potential palliative radiation to spine.  Latest MRI total spine on 1/22 revealed : Cervical spine:   1. Findings compatible with widespread osseous metastatic disease within the cervical spine. 2. No pathologic compression fracture. 3. No epidural tumor identified within the cervical spinal canal. 4. Cervical spondylosis, as described.   Thoracic spine:   1. Findings compatible with widespread osseous metastatic disease within the thoracic spine. 2. T7 vertebral compression fracture (30-40% height loss). Minimal bony retropulsion at the level of the T7 inferior endplate, not resulting in significant spinal canal stenosis. This likely reflects a pathologic compression fracture. There is mild edema within the T7 vertebral body, and this fracture may be subacute. 3. Mild T4 superior endplate vertebral compression fracture. Although chronic, this fracture may be pathologic. 4. No epidural tumor identified within the thoracic spinal canal. 5. Thoracic spondylosis, as described. 6. Please refer to the recent prior CT chest/abdomen/pelvis for description of bilateral rib fractures.   Lumbar spine:   1. Findings compatible with widespread osseous metastatic  disease within the lumbar spine and sacrum. 2. L3 superior endplate vertebral compression fracture, with 25% height loss and without bony retropulsion. This fracture may be pathologic. Additionally, there is mild edema and enhancement along the L3 superior endplate at this site, this fracture may be subacute. 3. No epidural tumor identified within the lumbar spinal canal. 4. Lumbar spondylosis, as described. Notably, there is multifactorial severe bilateral subarticular and central canal stenosis at L3-L4.    Request now received for consideration of lumbar bx/kyphoplasty/osteocool ablation. She is  currently afebrile, urine culture negative, blood culture negative.  WBC 2.8, hemoglobin 7.4, platelets 65k, creat 1.75, PT 17.2/INR 1.4.  Case and imaging studies have been reviewed by Dr. Dwaine Gale.  Per his review patient would likely be a candidate for thoracic 7/lumbar 3 biopsy/kyphoplasty/osteocool ablation.  Details/risks of procedures discussed with patient including but not limited to, internal bleeding, infection, injury to adjacent structures.  At this time there is no available time slot for procedure to be done at [facility] within the next week.  Can consider performing as an outpatient following discharge.  In the interim we will conduct a check for insurance preauthorization. Please contact IR team at [phone] once pt is discharged so we can expedite scheduling of case as an OP.   Thank you for this interesting consult.  I greatly enjoyed meeting ALVERDA NAZZARO and look forward to participating in their care.  A copy of this report was sent to the requesting provider on this date.  Electronically Signed: D. Rowe Robert, PA-C 04/01/2022, 4:37 PM   I spent a total of  25 minutes   in face to face in clinical consultation, greater than 50% of which was counseling/coordinating care for thoracic 7/ lumbar 3 biopsy/kyphoplasty/osteocool ablation

## 2022-04-01 NOTE — Progress Notes (Signed)
PROGRESS NOTE    LAVRA IMLER  HQI:[id number] DOB: 12/05/1940 DOA: 03/14/2022 PCP: Antony Contras, MD   Brief Narrative:   Patient is a 82 years old female with past medical history of anemia, cirrhosis of liver, diabetes mellitus, GERD, hepatitis, hyperlipidemia, hypertension, hypothyroidism presented to hospital after sustaining a fall from her stairs.  Patient had multiple rib fractures subdural hematoma vertebral fractures.  Case was discussed with neurosurgery and trauma surgery and patient underwent conservative treatment during hospitalization.  She also tested positive for streptococcal pneumonia and influenza and has completed Rocephin and Zithromax and Tamiflu course.  Incidentally patient was noted to have multiple lytic bone lesions in underwent left iliac wing bone biopsy on 1/16 + with findings of metastatic carcinoma. Patient also had significant volume overload patient was started on Lasix and albumin.  At this time, patient is awaiting for skilled nursing facility placement once her edema improved with diuresis and cleared by oncology and radiation oncology.  Assessment and Plan:  Fall with  multiple rib fractures Spinous process fracture of T1 Subdural hematoma/chronic hygromas Status post mechanical fall.  Patient was seen by trauma, neurosurgery and conservative management underway.  Physical therapy has recommended skilled nursing facility placement at this time.   Influenza A/lobar pneumonia (RML and RLL) Had elevated lactate.  Patient tested positive for urinary pneumococcal antigen.  Has received 5-day course of Rocephin and Zithromax.  Was on Tamiflu which the patient has completed for 5 days.  Repeat chest x-ray 1/17 with low lung volumes.  Plan is to follow-up with chest CT in 6 to 8 weeks   Volume Overload bilateral pleural effusions , bilateral lower extremity edema /acute on Chronic Diastolic CHF, hypoalbuminemia 2D echocardiogram showed LV ejection fraction of 60  to 65% with grade 1 diastolic dysfunction.  Venous duplex of the lower extremity without any DVT.  On IV Lasix and albumin.  On a Foley catheter.  Continue intake and output charting.  Accurate charting has not been done.  Patient is positive balance for 5689.  Hyponatremia Likely secondary to volume overload.  Has improved at this time at 138.  Continue to monitor BMP.  hypokalemia Replaced and improved.  Latest potassium of 3.8.  Abnormal LFTs/Transaminitis/ history of cirrhosis with portal hypertension -Right upper quadrant ultrasound consistent with cirrhosis with portal hypertension, low-density structure in the left lobe of the liver highlighted on prior noncontrast CTs.  Hepatitis panel was negative.  Could be secondary to liver congestion from volume overload.  On IV Lasix and albumin.  AKI on CKD stage IIIb Metabolic Acidosis, on bicarb Renal ultrasound was negative for hydronephrosis.  Urinalysis showed some possibility of UTI but no positive cultures.  Received antibiotic for pneumonia recently.  history of Breast Cancer S/p right mastectomy continue letrozole   New multiple bone lytic lesions, extensive metastatic disease. -CT imaging  on presentation showed bone lesions concerning for metastasis, lytic lesion in T1 vertebral body, left iliac wing lytic lesion and right acromion.  Patient underwent CT-guided biopsy with findings of metastatic carcinoma. MRI of the cervical thoracic and lumbar shows multiple metastatic lesions.  Spoke with the patient about it.  She clearly expressed no wish for chemotherapy.  Patient has gone down for radiation oncology department.  Will see the plan from radiation oncology.  Communicated with Dr. Lorenso Courier heme at oncology who is waiting on additional testing on her tumor.  Pancytopenia, likely multifactorial, h/o cirrhosis and currently concerning for metastatic cancer Being followed by heme oncology as outpatient  setting.     Hypertension Losartan  on hold.  Patient has been receiving IV albumin and Lasix during hospitalization.    Hyperlipidemia Statin and fenofibrate on hold due to elevated LFT.    Diabetes Mellitus Type 2, non-insulin-dependent, currently diet controlled Hemoglobin A1c of 4.9 on 03/16/2022.  Actos has been discontinued due to edema.  Continue diet control.  Hypothyroidism Continue Synthroid  Grade 1 obesity.Body mass index is 29.21 kg/m.  Would benefit from weight loss as outpatient.   Pressure injury, stage I medial buttocks, POA Continue pressure ulcer prevention protocol. Pressure Injury 03/14/22 Buttocks Medial;Mid Stage 1 -  Intact skin with non-blanchable redness of a localized area usually over a bony prominence. (Active)  03/14/22 2200  Location: Buttocks  Location Orientation: Medial;Mid  Staging: Stage 1 -  Intact skin with non-blanchable redness of a localized area usually over a bony prominence.  Wound Description (Comments):   Present on Admission: Yes     DVT prophylaxis: Place TED hose Start: 03/26/22 1847 SCDs Start: 03/14/22 2212 Place and maintain sequential compression device Start: 03/14/22 1706    Code Status: DNR  Family Communication:  I spoke with the patient's daughter on the phone and updated her about the clinical condition of the patient.  Did spoke did speak with her regarding the MRI findings and the extensive lesions.  She is going to talk to her mom about it.  Disposition Plan: Skilled nursing facility when okay with radiation oncology/volume improved.    Level of care: Med-Surg  Remains inpatient appropriate because: Volume overload, IV diuresis, pending skilled nursing facility placement, multiple metastatic lesions.   Consultants:  Medical oncology and radiation oncology General Surgery/Trauma Team Dr. Bobbye Morton Neurosurgery IR  Procedures:  Lytic iliac Wing Bone Marrow biopsy 03/25/2022  Antimicrobials:  Anti-infectives (From admission, onward)    Start      Dose/Rate Route Frequency Ordered Stop   03/15/22 1600  azithromycin (ZITHROMAX) 500 mg in sodium chloride 0.9 % 250 mL IVPB  Status:  Discontinued        500 mg 250 mL/hr over 60 Minutes Intravenous Every 24 hours 03/14/22 2211 03/17/22 1759   03/15/22 1000  cefTRIAXone (ROCEPHIN) 2 g in sodium chloride 0.9 % 100 mL IVPB        2 g 200 mL/hr over 30 Minutes Intravenous Every 24 hours 03/14/22 2211 03/20/22 1520   03/15/22 1000  oseltamivir (TAMIFLU) capsule 30 mg        30 mg Oral Daily 03/15/22 0910 03/19/22 1200   03/14/22 1530  cefTRIAXone (ROCEPHIN) 1 g in sodium chloride 0.9 % 100 mL IVPB        1 g 200 mL/hr over 30 Minutes Intravenous  Once 03/14/22 1515 03/14/22 1606   03/14/22 1530  azithromycin (ZITHROMAX) 500 mg in sodium chloride 0.9 % 250 mL IVPB        500 mg 250 mL/hr over 60 Minutes Intravenous  Once 03/14/22 1515 03/14/22 1717       Subjective: Today, patient was seen and examined at bedside.  Complains of mild pain in the back.  Denies overt shortness of breath chest pain fever or chills.  Has been diuresing.  Appetite okay.  Has mild cough.  Objective: Vitals:   03/31/22 1223 03/31/22 2041 04/01/22 0500 04/01/22 1245  BP: (!) 119/46 (!) 120/49 120/60 (!) 132/52  Pulse: 79 86  91  Resp: '17 18 19 18  '$ Temp: 98 F (36.7 C) 98.3 F (36.8 C) 98.1 F (36.7  C) 98.1 F (36.7 C)  TempSrc: Oral Oral Oral Oral  SpO2: 99% 98% 98% 97%  Weight:   74.8 kg   Height:        Intake/Output Summary (Last 24 hours) at 04/01/2022 1326 Last data filed at 04/01/2022 1036 Gross per 24 hour  Intake 785.19 ml  Output 2350 ml  Net -1564.81 ml     Filed Weights   03/30/22 0443 03/31/22 0500 04/01/22 0500  Weight: 77.3 kg 77.2 kg 74.8 kg   Physical examination:  General: Alert awake and Communicative, appears weak and deconditioned, elderly female, HENT:   No scleral pallor or icterus noted. Oral mucosa is moist.  Chest:   Diminished breath sounds bilaterally. No crackles or  wheezes.  Right-sided mastectomy. CVS: S1 &S2 heard. No murmur.  Regular rate and rhythm. Abdomen: Soft, nontender, mildly distended abdomen.  Bowel sounds are heard.  Foley catheter in place.   Extremities: No cyanosis, clubbing bilateral lower extremity pitting edema.  Peripheral pulses are palpable. Psych: Alert, awake and oriented, normal mood CNS:  No cranial nerve deficits.  Power equal in all extremities.   Skin: Warm and dry.  No rashes noted.   Data Reviewed: I have personally reviewed the following labs and imaging studies.  CBC: Recent Labs  Lab 03/26/22 0413 03/28/22 0406 03/29/22 0450 03/30/22 0543 03/31/22 0420 04/01/22 0410  WBC 5.7 3.6* 3.0* 2.4* 2.7* 2.8*  NEUTROABS 4.6  --  2.2  --   --   --   HGB 8.4* 7.7* 7.2* 7.1* 7.0* 7.4*  HCT 26.1* 24.4* 22.2* 21.9* 22.3* 23.4*  MCV 103.2* 107.0* 107.2* 108.4* 108.3* 109.3*  PLT 101* 82* 72* 69* 66* 65*    Basic Metabolic Panel: Recent Labs  Lab 03/26/22 0413 03/27/22 1002 03/28/22 0406 03/29/22 0450 03/30/22 0543 03/31/22 0420 04/01/22 0410  NA 132* 134* 136 137 140 141 138  K 3.6 3.5 3.8 3.9 3.7 3.7 3.8  CL 105 108 107 108 109 110 106  CO2 19* 18* 17* 20* '24 22 23  '$ GLUCOSE 87 103* 91 90 95 89 98  BUN 45* 45* 43* 42* 43* 41* 48*  CREATININE 2.03* 2.07* 1.86* 1.78* 1.66* 1.76* 1.75*  CALCIUM 8.0* 7.9* 8.1* 8.3* 8.3* 8.7* 8.6*  MG 1.9 1.8  --  2.3  --  2.0 1.9  PHOS 4.0  --   --  3.6  --   --   --     GFR: Estimated Creatinine Clearance: 24.9 mL/min (A) (by C-G formula based on SCr of 1.75 mg/dL (H)). Liver Function Tests: Recent Labs  Lab 03/28/22 0406 03/29/22 0450 03/30/22 0543 03/31/22 0420 04/01/22 0410  AST 144* 135* 125* 134* 133*  ALT 37 34 31 34 34  ALKPHOS 203* 172* 185* 182* 174*  BILITOT 1.6* 1.8* 1.7* 2.0* 2.3*  PROT 5.5* 5.2* 5.5* 6.0* 6.1*  ALBUMIN 2.3* 2.3* 2.6* 2.9* 3.0*    No results for input(s): "LIPASE", "AMYLASE" in the last 168 hours. No results for input(s): "AMMONIA"  in the last 168 hours. Coagulation Profile: No results for input(s): "INR", "PROTIME" in the last 168 hours.  Cardiac Enzymes: Recent Labs  Lab 03/31/22 0420  CKTOTAL 26*    BNP (last 3 results) No results for input(s): "PROBNP" in the last 8760 hours. HbA1C: No results for input(s): "HGBA1C" in the last 72 hours. CBG: No results for input(s): "GLUCAP" in the last 168 hours. Lipid Profile: No results for input(s): "CHOL", "HDL", "LDLCALC", "TRIG", "CHOLHDL", "LDLDIRECT" in the  last 72 hours. Thyroid Function Tests: No results for input(s): "TSH", "T4TOTAL", "FREET4", "T3FREE", "THYROIDAB" in the last 72 hours. Anemia Panel: No results for input(s): "VITAMINB12", "FOLATE", "FERRITIN", "TIBC", "IRON", "RETICCTPCT" in the last 72 hours.  Sepsis Labs: Recent Labs  Lab 03/27/22 0407  LATICACIDVEN 2.4*     No results found for this or any previous visit (from the past 240 hour(s)).   Radiology Studies: MR TOTAL SPINE METS SCREENING  Result Date: 03/31/2022 CLINICAL DATA:  Provided history: Back pain. EXAM: MRI TOTAL SPINE WITHOUT AND WITH CONTRAST TECHNIQUE: Multisequence MR imaging of the spine from the cervical spine to the sacrum was performed prior to and following IV contrast administration for evaluation of spinal metastatic disease. CONTRAST:  7.32m GADAVIST GADOBUTROL 1 MMOL/ML IV SOLN COMPARISON:  CT chest/abdomen/pelvis 03/14/2022. CT chest/abdomen/pelvis 02/28/2020. FINDINGS: MRI CERVICAL SPINE FINDINGS Alignment: Straightening of the expected cervical lordosis. No significant spondylolisthesis. Vertebrae: Multifocal marrow signal abnormality within the cervical spine compatible with widespread osseous metastatic disease (this is best appreciated on the sagittal T1-weighted sequence). Corresponding pathologic enhancement at multiple sites. No pathologic fracture is identified. No evidence of epidural tumorwithin the spinal canal. Cord: No appreciable signal abnormality or  pathologic enhancement within the cervical spinal cord. Minimal spinal cord flattening at C4-C5, as described below. Posterior Fossa, vertebral arteries, paraspinal tissues: No acute finding within included portions of the posterior fossa. Vertebral arteries poorly assessed in the absence of axial T2 weighted imaging. No paraspinal mass or collection. Disc levels: Cervical spondylosis with multilevel disc degeneration, disc bulges/central disc protrusions, posterior disc osteophytes, uncovertebral hypertrophy and facet arthrosis. At C4-C5, a posterior disc osteophyte complex contributes to moderate spinal canal stenosis. The disc osteophyte complex contacts and minimally flattens the ventral aspect of the spinal cord. No more than mild spinal canal narrowing at the remaining cervical levels. Multilevel foraminal stenosis, incompletely assessed on sagittal imaging. MRI THORACIC SPINE FINDINGS Alignment: No significant spondylolisthesis. Minimal bony retropulsion at the level of the T7 inferior endplate Vertebrae: Multifocal marrow signal abnormality within the thoracic spine compatible with widespread osseous metastatic disease (this is best appreciated on the sagittal T1 weighted sequences). Corresponding pathologic enhancement at multiple sites. T4 superior endplate compression fracture, with mild height loss and without bony retropulsion. Although chronic, this may reflect a pathologic fracture. T7 vertebral compression fracture (30-40% height loss). Minimal bony retropulsion at the level of the T7 inferior endplate. This likely reflects a pathologic compression fracture. There is mild edema within the T7 vertebral body, and this fracture may be subacute. Please refer to the recent prior CT chest/abdomen/pelvis for description of bilateral rib fractures. No enhancing epidural tumor is identified within the thoracic spinal canal. Cord: No signal abnormality or pathologic enhancement is appreciated within the spinal  cord at the thoracic levels. There is mild prominence of the central canal of the mid to lower thoracic levels. Paraspinal and other soft tissues: Please refer to the recent prior CT chest/abdomen/pelvis for a description of thoracic and abdominopelvic soft tissue findings. No paraspinal mass or collection. Disc levels: Thoracic spondylosis with multilevel disc degeneration, disc bulges and facet arthrosis. At T9-T10, a small central disc protrusion mildly effaces the ventral thecal sac and contacts the ventral aspect of the spinal cord. No significant spinal canal stenosis at the remaining thoracic levels. No compressive foraminal stenosis is identified. MRI LUMBAR SPINE FINDINGS Segmentation: 5 lumbar vertebrae. The caudal most well-formed intervertebral disc space is designated L5-S1. Alignment:  Significant spondylolisthesis. Vertebrae: Multifocal signal abnormality within the lumbar  spine and visualized sacrum compatible with widespread osseous metastatic disease. Corresponding pathologic enhancement at multiple sites. L3 compression fracture (25% height loss). This fracture may be pathologic. There is mild edema and enhancement along the L3 superior endplate at this site, this fracture may be subacute. No epidural tumor is identified within the lumbar spinal canal. Conus medullaris: Extends to the L1 level and appears normal. Paraspinal and other soft tissues: Please refer to the recent prior CT chest/abdomen/pelvis for description of abdominopelvic soft tissue findings. No paraspinal mass or collection. Disc levels: Lumbar spondylosis with multilevel disc degeneration, disc bulges, facet arthrosis and ligamentum flavum hypertrophy. Findings are most notably as follows. Disc degeneration is greatest to the left at L3-L4 (advanced at this site). L2-L3: Disc bulge. Facet arthrosis ligamentum flavum hypertrophy. Moderate spinal canal stenosis. Mild left neural foraminal narrowing. L3-L4: Disc bulge. Facet  arthrosis and ligamentum flavum hypertrophy. Severe bilateral subarticular and central canal stenosis. Bilateral neural foraminal narrowing (mild right, mild/moderate left). L4-L5: Disc bulge. Facet arthrosis and ligamentum flavum hypertrophy. Right subarticular narrowing with medialization of the descending right L5 nerve root. Left subarticular narrowing with crowding of the descending left L5 nerve root. Mild right subarticular narrowing. Mild to moderate central canal stenosis. Moderate right neural foraminal narrowing. L5-S1: Disc bulge. Superimposed central disc protrusion. Facet arthrosis. The disc protrusion contributes to mild bilateral subarticular narrowing (without frank nerve root impingement). No significant central canal or foraminal stenosis. IMPRESSION: Cervical spine: 1. Findings compatible with widespread osseous metastatic disease within the cervical spine. 2. No pathologic compression fracture. 3. No epidural tumor identified within the cervical spinal canal. 4. Cervical spondylosis, as described. Thoracic spine: 1. Findings compatible with widespread osseous metastatic disease within the thoracic spine. 2. T7 vertebral compression fracture (30-40% height loss). Minimal bony retropulsion at the level of the T7 inferior endplate, not resulting in significant spinal canal stenosis. This likely reflects a pathologic compression fracture. There is mild edema within the T7 vertebral body, and this fracture may be subacute. 3. Mild T4 superior endplate vertebral compression fracture. Although chronic, this fracture may be pathologic. 4. No epidural tumor identified within the thoracic spinal canal. 5. Thoracic spondylosis, as described. 6. Please refer to the recent prior CT chest/abdomen/pelvis for description of bilateral rib fractures. Lumbar spine: 1. Findings compatible with widespread osseous metastatic disease within the lumbar spine and sacrum. 2. L3 superior endplate vertebral compression  fracture, with 25% height loss and without bony retropulsion. This fracture may be pathologic. Additionally, there is mild edema and enhancement along the L3 superior endplate at this site, this fracture may be subacute. 3. No epidural tumor identified within the lumbar spinal canal. 4. Lumbar spondylosis, as described. Notably, there is multifactorial severe bilateral subarticular and central canal stenosis at L3-L4. Electronically Signed   By: Kellie Simmering D.O.   On: 03/31/2022 13:43    Scheduled Meds:  calcium carbonate  1,250 mg Oral Q breakfast   Chlorhexidine Gluconate Cloth  6 each Topical Daily   cholecalciferol  1,000 Units Oral Daily   cyanocobalamin  1,000 mcg Oral Daily   feeding supplement (GLUCERNA SHAKE)  237 mL Oral TID BM   fluticasone  2 spray Each Nare Daily   folic acid  1 mg Oral Daily   furosemide  80 mg Intravenous BID   guaiFENesin  1,200 mg Oral BID   hydrocortisone   Rectal TID   letrozole  2.5 mg Oral QODAY   levothyroxine  112 mcg Oral Q0600   loratadine  10 mg Oral Daily   methocarbamol  1,000 mg Oral TID   pantoprazole  40 mg Oral Daily   potassium chloride  40 mEq Oral Daily   senna-docusate  1 tablet Oral BID   sodium bicarbonate  650 mg Oral BID   Continuous Infusions:  albumin human 12.5 g (04/01/22 1030)     LOS: 18 days   Oscar La, MD [facility] Available via Epic secure chat 7am-7pm After these hours, please refer to coverage provider listed on [email or web address] 04/01/2022, 1:26 PM

## 2022-04-01 NOTE — Progress Notes (Signed)
I spoke with Dr. Dwaine Gale in IR after reviewing the patient's MRI with Dr. Lisbeth Renshaw. There are compression fractures at T7 and L3, and widespread disease in the spine. Dr. Dwaine Gale is in agreement to consider biopsy/osteocool/vertebral augmentation to both sites. If she has malignancy in the spine, we would consider additional palliative radiation to these sites. IR will see her for inpt consult and plan for outpatient procedure.   I spoke with the patient and she is in agreement with this plan. I'll also reach out to pathology to see if IHC is back on her biopsy from 03/25/22.     Carola Rhine, PAC

## 2022-04-01 NOTE — Progress Notes (Signed)
Mobility Specialist - Progress Note   04/01/22 1051  Mobility  Activity Ambulated with assistance in room  Level of Assistance Contact guard assist, steadying assist  Assistive Device Front wheel walker  Distance Ambulated (ft) 15 ft  Range of Motion/Exercises Active  Activity Response Tolerated well  Mobility Referral Yes  $Mobility charge 1 Mobility   Pt was found in bed and agreeable to ambulate in room to chair. Had no complaints during session and at EOS was left on recliner chair with necessities in reach.   Ferd Hibbs Mobility Specialist

## 2022-04-02 DIAGNOSIS — J189 Pneumonia, unspecified organism: Secondary | ICD-10-CM | POA: Diagnosis not present

## 2022-04-02 DIAGNOSIS — W19XXXD Unspecified fall, subsequent encounter: Secondary | ICD-10-CM | POA: Diagnosis not present

## 2022-04-02 DIAGNOSIS — N179 Acute kidney failure, unspecified: Secondary | ICD-10-CM | POA: Diagnosis not present

## 2022-04-02 DIAGNOSIS — C7951 Secondary malignant neoplasm of bone: Secondary | ICD-10-CM | POA: Diagnosis not present

## 2022-04-02 LAB — CBC
HCT: 20.7 % — ABNORMAL LOW (ref 36.0–46.0)
Hemoglobin: 6.4 g/dL — CL (ref 12.0–15.0)
MCH: 34.6 pg — ABNORMAL HIGH (ref 26.0–34.0)
MCHC: 30.9 g/dL (ref 30.0–36.0)
MCV: 111.9 fL — ABNORMAL HIGH (ref 80.0–100.0)
Platelets: 54 10*3/uL — ABNORMAL LOW (ref 150–400)
RBC: 1.85 MIL/uL — ABNORMAL LOW (ref 3.87–5.11)
RDW: 22 % — ABNORMAL HIGH (ref 11.5–15.5)
WBC: 2.3 10*3/uL — ABNORMAL LOW (ref 4.0–10.5)
nRBC: 0 % (ref 0.0–0.2)

## 2022-04-02 LAB — BASIC METABOLIC PANEL
Anion gap: 13 (ref 5–15)
BUN: 44 mg/dL — ABNORMAL HIGH (ref 8–23)
CO2: 22 mmol/L (ref 22–32)
Calcium: 8.7 mg/dL — ABNORMAL LOW (ref 8.9–10.3)
Chloride: 107 mmol/L (ref 98–111)
Creatinine, Ser: 1.89 mg/dL — ABNORMAL HIGH (ref 0.44–1.00)
GFR, Estimated: 27 mL/min — ABNORMAL LOW (ref >60)
Glucose, Bld: 98 mg/dL (ref 70–99)
Potassium: 3.6 mmol/L (ref 3.5–5.1)
Sodium: 142 mmol/L (ref 135–145)

## 2022-04-02 LAB — HEMOGLOBIN AND HEMATOCRIT, BLOOD
HCT: 25.5 % — ABNORMAL LOW (ref 36.0–46.0)
Hemoglobin: 8 g/dL — ABNORMAL LOW (ref 12.0–15.0)

## 2022-04-02 LAB — PREPARE RBC (CROSSMATCH)

## 2022-04-02 LAB — ABO/RH
ABO/RH(D): AB POS
ABO/RH(D): AB POS

## 2022-04-02 LAB — MAGNESIUM: Magnesium: 1.7 mg/dL (ref 1.7–2.4)

## 2022-04-02 LAB — SURGICAL PATHOLOGY

## 2022-04-02 MED ORDER — SODIUM CHLORIDE 0.9% IV SOLUTION: Freq: Once | INTRAVENOUS | Status: AC

## 2022-04-02 NOTE — Progress Notes (Signed)
OT Cancellation Note  Patient Details Name: GICELA SCHWARTING MRN: [id number] DOB: 09-Dec-1940   Cancelled Treatment:    Reason Eval/Treat Not Completed: Other (comment) Patient pending unit of PRBCs at this time with poor participation with mobility tech this AM. OT to continue to follow and check back as schedule will allow.  Rennie Plowman, MS Acute Rehabilitation Department Office# [phone]   04/02/2022, 2:45 PM

## 2022-04-02 NOTE — Hospital Course (Addendum)
Patient is a 82 years old female with past medical history of anemia, cirrhosis of liver, diabetes mellitus, GERD, hepatitis, hyperlipidemia, hypertension, hypothyroidism presented to the hospital after sustaining a fall from her stairs.  Patient had multiple rib fractures subdural hematoma vertebral fractures.  Case was discussed with neurosurgery and trauma surgery and patient underwent conservative treatment during hospitalization.  She also tested positive for streptococcal pneumonia and influenza and has completed Rocephin and Zithromax and Tamiflu course.  Incidentally patient was noted to have multiple lytic bone lesions in underwent left iliac wing bone biopsy on 1/16 + with findings of metastatic carcinoma. Patient also had significant volume overload patient was started on Lasix and albumin.  At this time, patient is awaiting for skilled nursing facility placement once her edema improved with diuresis and cleared by oncology and radiation oncology.   Assessment and Plan:   Fall with  multiple rib fractures Spinous process fracture of T1 Subdural hematoma/chronic hygromas Status post mechanical fall.  Patient was seen by trauma, neurosurgery and conservative management underway.  Physical therapy has recommended skilled nursing facility placement at this time.   Influenza A/lobar pneumonia (RML and RLL) Had elevated lactate.  Patient tested positive for urinary pneumococcal antigen.  Has received 5-day course of Rocephin and Zithromax.  Was on Tamiflu which the patient has completed for 5 days.  Repeat chest x-ray 1/17 with low lung volumes.  Plan is to follow-up with chest CT in 6 to 8 weeks.  Currently patient is stable.  On room air.    Volume Overload bilateral pleural effusions , bilateral lower extremity edema /acute on Chronic Diastolic CHF, hypoalbuminemia 2D echocardiogram showed LV ejection fraction of 60 to 65% with grade 1 diastolic dysfunction.  Venous duplex of the lower extremity  without any DVT.  Patient has received IV Lasix and albumin during hospitalization.  Will discontinue Foley catheter.  Will transition to torsemide 40 mg daily on discharge.  Need closer monitoring of BMP as outpatient.   Hyponatremia Likely secondary to volume overload.  Has improved at this time with diuresis.  hypokalemia Replaced and improved.     Abnormal LFTs/Transaminitis/ history of cirrhosis with portal hypertension -Right upper quadrant ultrasound consistent with cirrhosis with portal hypertension, low-density structure in the left lobe of the liver highlighted on prior noncontrast CTs.  Hepatitis panel was negative.  Could be secondary to liver congestion from volume overload.  On IV Lasix and albumin.   AKI on CKD stage IIIb Metabolic Acidosis, on bicarb Renal ultrasound was negative for hydronephrosis.  Urinalysis showed some possibility of UTI but no positive cultures.  Received antibiotic for pneumonia recently.   history of Breast Cancer S/p right mastectomy continue letrozole    New multiple bone lytic lesions, extensive metastatic disease. -CT imaging  on presentation showed bone lesions concerning for metastasis, lytic lesion in T1 vertebral body, left iliac wing lytic lesion and right acromion.  Patient underwent CT-guided biopsy with findings of metastatic carcinoma. MRI of the cervical thoracic and lumbar shows multiple metastatic lesions.  Spoke with the patient about it.  She clearly expressed no wish for chemotherapy.  Status post simulation from radiation oncology.  Follow-up with Dr. Lorenso Courier heme at oncology who is waiting on additional testing on her tumor.   Pancytopenia, likely multifactorial, h/o cirrhosis and currently concerning for metastatic cancer Being followed by heme oncology as outpatient setting.     Anemia due to metastatic cancer with history of cirrhosis.  No active blood loss.  Received  [facility] 04/02/2022.  Has baseline anemia to start with.     Hypertension Losartan on hold.  Patient received IV albumin and Lasix during hospitalization.   Hyperlipidemia Statin and fenofibrate on hold due to elevated LFT.    Diabetes Mellitus Type 2, non-insulin-dependent, currently diet controlled Hemoglobin A1c of 4.9 on 03/16/2022.  Actos has been discontinued due to edema.  Continue diet control.   Hypothyroidism Continue Synthroid   Grade 1 obesity.Body mass index is 29.21 kg/m.  Would benefit from weight loss as outpatient.   Pressure injury, stage I medial buttocks, POA Continue pressure ulcer prevention protocol.

## 2022-04-02 NOTE — Progress Notes (Signed)
Mobility Specialist - Progress Note   04/02/22 0955  Mobility  Activity Ambulated with assistance to bathroom  Level of Assistance Contact guard assist, steadying assist  Assistive Device Front wheel walker  Distance Ambulated (ft) 18 ft  Range of Motion/Exercises Active  Activity Response Tolerated well  Mobility Referral Yes  $Mobility charge 1 Mobility   Pt was found in bed and wanting to ambulate to the bathroom. Was fatigued throughout session and at EOS returned to bed with all necessities in reach.  Ferd Hibbs Mobility Specialist

## 2022-04-02 NOTE — TOC Progression Note (Signed)
Transition of Care [facility]) - Progression Note    Patient Details  Name: Catherine Decker MRN: [id number] Date of Birth: 07/25/1940  Transition of Care [facility]) CM/SW [place], LCSW Phone Number: 04/02/2022, 10:26 AM  Clinical Narrative:    CSW spoke with [facility] to confirm bed availability. She reported the facility does have a bed for the pt today if stable for D/C. MD and RN made aware. TOC to follow.    Expected Discharge Plan: [place] Barriers to Discharge: Continued Medical Work up  Expected Discharge Plan and Services       Living arrangements for the past 2 months: Single Family Home                                       Social Determinants of Health (SDOH) Interventions SDOH Screenings   Food Insecurity: No Food Insecurity (03/14/2022)  Housing: Low Risk  (03/14/2022)  Transportation Needs: No Transportation Needs (03/14/2022)  Utilities: Not At Risk (03/14/2022)  Tobacco Use: Low Risk  (03/14/2022)    Readmission Risk Interventions     No data to display

## 2022-04-02 NOTE — Plan of Care (Signed)
  Problem: Education: Goal: Knowledge of General Education information will improve Description: Including pain rating scale, medication(s)/side effects and non-pharmacologic comfort measures Outcome: Progressing   Problem: Health Behavior/Discharge Planning: Goal: Ability to manage health-related needs will improve Outcome: Progressing

## 2022-04-02 NOTE — Progress Notes (Signed)
PROGRESS NOTE    Catherine Decker  YQM:[id number] DOB: Dec 26, 1940 DOA: 03/14/2022 PCP: Antony Contras, MD   Brief Narrative:   Patient is a 82 years old female with past medical history of anemia, cirrhosis of liver, diabetes mellitus, GERD, hepatitis, hyperlipidemia, hypertension, hypothyroidism presented to hospital after sustaining a fall from her stairs.  Patient had multiple rib fractures subdural hematoma vertebral fractures.  Case was discussed with neurosurgery and trauma surgery and patient underwent conservative treatment during hospitalization.  She also tested positive for streptococcal pneumonia and influenza and has completed Rocephin and Zithromax and Tamiflu course.  Incidentally patient was noted to have multiple lytic bone lesions in underwent left iliac wing bone biopsy on 1/16 + with findings of metastatic carcinoma. Patient also had significant volume overload patient was started on Lasix and albumin.  At this time, patient is awaiting for skilled nursing facility placement once her edema improved with diuresis and cleared by oncology and radiation oncology.  Assessment and Plan:  Fall with  multiple rib fractures Spinous process fracture of T1 Subdural hematoma/chronic hygromas Status post mechanical fall.  Patient was seen by trauma, neurosurgery and conservative management underway.  Physical therapy has recommended skilled nursing facility placement at this time.   Influenza A/lobar pneumonia (RML and RLL) Had elevated lactate.  Patient tested positive for urinary pneumococcal antigen.  Has received 5-day course of Rocephin and Zithromax.  Was on Tamiflu which the patient has completed for 5 days.  Repeat chest x-ray 1/17 with low lung volumes.  Plan is to follow-up with chest CT in 6 to 8 weeks   Volume Overload bilateral pleural effusions , bilateral lower extremity edema /acute on Chronic Diastolic CHF, hypoalbuminemia 2D echocardiogram showed LV ejection fraction of 60  to 65% with grade 1 diastolic dysfunction.  Venous duplex of the lower extremity without any DVT.  Patient has received IV Lasix and albumin during hospitalization.  On a Foley catheter.  Continue intake and output charting.   Patient is positive balance for 4482 mL.  Hyponatremia Likely secondary to volume overload.  Has improved at this time at 142.  Continue to monitor BMP.  hypokalemia Replaced and improved.  Latest potassium of 3.6.  Abnormal LFTs/Transaminitis/ history of cirrhosis with portal hypertension -Right upper quadrant ultrasound consistent with cirrhosis with portal hypertension, low-density structure in the left lobe of the liver highlighted on prior noncontrast CTs.  Hepatitis panel was negative.  Could be secondary to liver congestion from volume overload.  On IV Lasix and albumin.  AKI on CKD stage IIIb Metabolic Acidosis, on bicarb Renal ultrasound was negative for hydronephrosis.  Urinalysis showed some possibility of UTI but no positive cultures.  Received antibiotic for pneumonia recently.  history of Breast Cancer S/p right mastectomy continue letrozole   New multiple bone lytic lesions, extensive metastatic disease. -CT imaging  on presentation showed bone lesions concerning for metastasis, lytic lesion in T1 vertebral body, left iliac wing lytic lesion and right acromion.  Patient underwent CT-guided biopsy with findings of metastatic carcinoma. MRI of the cervical thoracic and lumbar shows multiple metastatic lesions.  Spoke with the patient about it.  She clearly expressed no wish for chemotherapy.  Patient has gone down for radiation oncology department.  Will see the plan from radiation oncology.  Communicated with Dr. Lorenso Courier heme at oncology who is waiting on additional testing on her tumor.  Pancytopenia, likely multifactorial, h/o cirrhosis and currently concerning for metastatic cancer Being followed by heme oncology as outpatient setting.  Anemia  hemoglobin of 6.4 today.  Likely secondary to ongoing blood draws on the background of baseline anemia.  Will transfuse 1 unit of packed RBC.   Hypertension Losartan on hold.  Patient has been receiving IV albumin and Lasix during hospitalization.    Hyperlipidemia Statin and fenofibrate on hold due to elevated LFT.    Diabetes Mellitus Type 2, non-insulin-dependent, currently diet controlled Hemoglobin A1c of 4.9 on 03/16/2022.  Actos has been discontinued due to edema.  Continue diet control.  Hypothyroidism Continue Synthroid  Grade 1 obesity.Body mass index is 29.21 kg/m.  Would benefit from weight loss as outpatient.   Pressure injury, stage I medial buttocks, POA Continue pressure ulcer prevention protocol. Pressure Injury 03/14/22 Buttocks Medial;Mid Stage 1 -  Intact skin with non-blanchable redness of a localized area usually over a bony prominence. (Active)  03/14/22 2200  Location: Buttocks  Location Orientation: Medial;Mid  Staging: Stage 1 -  Intact skin with non-blanchable redness of a localized area usually over a bony prominence.  Wound Description (Comments):   Present on Admission: Yes     DVT prophylaxis: Place TED hose Start: 03/26/22 1847 SCDs Start: 03/14/22 2212 Place and maintain sequential compression device Start: 03/14/22 1706    Code Status: DNR  Family Communication:  Spoke with the patient's daughter on the phone 04/01/2022  Disposition Plan: Skilled nursing facility likely 04/03/2022.  Level of care: Med-Surg  Remains inpatient appropriate because: Volume overload, IV diuresis, pending skilled nursing facility placement, multiple metastatic lesions.   Consultants:  Medical oncology and radiation oncology General Surgery/Trauma Team Dr. Bobbye Morton Neurosurgery IR  Procedures:  Lytic iliac Wing Bone Marrow biopsy 03/25/2022 PRBC transfusion  Antimicrobials:  Anti-infectives (From admission, onward)    Start     Dose/Rate Route Frequency  Ordered Stop   03/15/22 1600  azithromycin (ZITHROMAX) 500 mg in sodium chloride 0.9 % 250 mL IVPB  Status:  Discontinued        500 mg 250 mL/hr over 60 Minutes Intravenous Every 24 hours 03/14/22 2211 03/17/22 1759   03/15/22 1000  cefTRIAXone (ROCEPHIN) 2 g in sodium chloride 0.9 % 100 mL IVPB        2 g 200 mL/hr over 30 Minutes Intravenous Every 24 hours 03/14/22 2211 03/20/22 1520   03/15/22 1000  oseltamivir (TAMIFLU) capsule 30 mg        30 mg Oral Daily 03/15/22 0910 03/19/22 1200   03/14/22 1530  cefTRIAXone (ROCEPHIN) 1 g in sodium chloride 0.9 % 100 mL IVPB        1 g 200 mL/hr over 30 Minutes Intravenous  Once 03/14/22 1515 03/14/22 1606   03/14/22 1530  azithromycin (ZITHROMAX) 500 mg in sodium chloride 0.9 % 250 mL IVPB        500 mg 250 mL/hr over 60 Minutes Intravenous  Once 03/14/22 1515 03/14/22 1717       Subjective: Today, patient was seen and examined at bedside.  Denies interval complaints.  Denies any nausea, vomiting fever chills or rigors.  Denies any abdominal pain.  Noted to be anemic with hemoglobin of 6.4.  No active GI bleed.  Objective: Vitals:   04/01/22 1245 04/01/22 2020 04/01/22 2300 04/02/22 0500  BP: (!) 132/52 (!) 132/49  (!) 128/48  Pulse: 91 91  89  Resp: 18 (!) '22 17 17  '$ Temp: 98.1 F (36.7 C) 98.2 F (36.8 C)  98.3 F (36.8 C)  TempSrc: Oral Oral  Oral  SpO2: 97% 98%  98%  Weight:      Height:        Intake/Output Summary (Last 24 hours) at 04/02/2022 1112 Last data filed at 04/02/2022 1100 Gross per 24 hour  Intake 492.48 ml  Output 1700 ml  Net -1207.52 ml     Filed Weights   03/30/22 0443 03/31/22 0500 04/01/22 0500  Weight: 77.3 kg 77.2 kg 74.8 kg   Physical examination:  General: Alert awake and Communicative, elderly female, not in obvious distress.   HENT:   Pallor noted.. Oral mucosa is moist.  Chest:   Diminished breath sounds bilaterally.  Status post right-sided mastectomy. CVS: S1 &S2 heard. No murmur.   Regular rate and rhythm. Abdomen: Soft, mildly distended abdomen but nontender.  Bowel sounds are heard.  Foley catheter in place.   Extremities: No cyanosis, clubbing bilateral lower extremity pitting edema.  Peripheral pulses are palpable. Psych: Alert, awake and oriented, normal mood CNS:  No cranial nerve deficits.  Power equal in all extremities.   Skin: Warm and dry.  No rashes noted.   Data Reviewed: I have personally reviewed the following labs and imaging studies.  CBC: Recent Labs  Lab 03/29/22 0450 03/30/22 0543 03/31/22 0420 04/01/22 0410 04/02/22 0402  WBC 3.0* 2.4* 2.7* 2.8* 2.3*  NEUTROABS 2.2  --   --   --   --   HGB 7.2* 7.1* 7.0* 7.4* 6.4*  HCT 22.2* 21.9* 22.3* 23.4* 20.7*  MCV 107.2* 108.4* 108.3* 109.3* 111.9*  PLT 72* 69* 66* 65* 54*    Basic Metabolic Panel: Recent Labs  Lab 03/27/22 1002 03/28/22 0406 03/29/22 0450 03/30/22 0543 03/31/22 0420 04/01/22 0410 04/02/22 0402  NA 134*   < > 137 140 141 138 142  K 3.5   < > 3.9 3.7 3.7 3.8 3.6  CL 108   < > 108 109 110 106 107  CO2 18*   < > 20* '24 22 23 22  '$ GLUCOSE 103*   < > 90 95 89 98 98  BUN 45*   < > 42* 43* 41* 48* 44*  CREATININE 2.07*   < > 1.78* 1.66* 1.76* 1.75* 1.89*  CALCIUM 7.9*   < > 8.3* 8.3* 8.7* 8.6* 8.7*  MG 1.8  --  2.3  --  2.0 1.9 1.7  PHOS  --   --  3.6  --   --   --   --    < > = values in this interval not displayed.    GFR: Estimated Creatinine Clearance: 23 mL/min (A) (by C-G formula based on SCr of 1.89 mg/dL (H)). Liver Function Tests: Recent Labs  Lab 03/28/22 0406 03/29/22 0450 03/30/22 0543 03/31/22 0420 04/01/22 0410  AST 144* 135* 125* 134* 133*  ALT 37 34 31 34 34  ALKPHOS 203* 172* 185* 182* 174*  BILITOT 1.6* 1.8* 1.7* 2.0* 2.3*  PROT 5.5* 5.2* 5.5* 6.0* 6.1*  ALBUMIN 2.3* 2.3* 2.6* 2.9* 3.0*    No results for input(s): "LIPASE", "AMYLASE" in the last 168 hours. No results for input(s): "AMMONIA" in the last 168 hours. Coagulation Profile: No  results for input(s): "INR", "PROTIME" in the last 168 hours.  Cardiac Enzymes: Recent Labs  Lab 03/31/22 0420  CKTOTAL 26*    BNP (last 3 results) No results for input(s): "PROBNP" in the last 8760 hours. HbA1C: No results for input(s): "HGBA1C" in the last 72 hours. CBG: No results for input(s): "GLUCAP" in the last 168 hours. Lipid Profile: No results for input(s): "CHOL", "  HDL", "LDLCALC", "TRIG", "CHOLHDL", "LDLDIRECT" in the last 72 hours. Thyroid Function Tests: No results for input(s): "TSH", "T4TOTAL", "FREET4", "T3FREE", "THYROIDAB" in the last 72 hours. Anemia Panel: No results for input(s): "VITAMINB12", "FOLATE", "FERRITIN", "TIBC", "IRON", "RETICCTPCT" in the last 72 hours.  Sepsis Labs: Recent Labs  Lab 03/27/22 0407  LATICACIDVEN 2.4*     No results found for this or any previous visit (from the past 240 hour(s)).   Radiology Studies: MR TOTAL SPINE METS SCREENING  Result Date: 03/31/2022 CLINICAL DATA:  Provided history: Back pain. EXAM: MRI TOTAL SPINE WITHOUT AND WITH CONTRAST TECHNIQUE: Multisequence MR imaging of the spine from the cervical spine to the sacrum was performed prior to and following IV contrast administration for evaluation of spinal metastatic disease. CONTRAST:  7.105m GADAVIST GADOBUTROL 1 MMOL/ML IV SOLN COMPARISON:  CT chest/abdomen/pelvis 03/14/2022. CT chest/abdomen/pelvis 02/28/2020. FINDINGS: MRI CERVICAL SPINE FINDINGS Alignment: Straightening of the expected cervical lordosis. No significant spondylolisthesis. Vertebrae: Multifocal marrow signal abnormality within the cervical spine compatible with widespread osseous metastatic disease (this is best appreciated on the sagittal T1-weighted sequence). Corresponding pathologic enhancement at multiple sites. No pathologic fracture is identified. No evidence of epidural tumorwithin the spinal canal. Cord: No appreciable signal abnormality or pathologic enhancement within the cervical spinal  cord. Minimal spinal cord flattening at C4-C5, as described below. Posterior Fossa, vertebral arteries, paraspinal tissues: No acute finding within included portions of the posterior fossa. Vertebral arteries poorly assessed in the absence of axial T2 weighted imaging. No paraspinal mass or collection. Disc levels: Cervical spondylosis with multilevel disc degeneration, disc bulges/central disc protrusions, posterior disc osteophytes, uncovertebral hypertrophy and facet arthrosis. At C4-C5, a posterior disc osteophyte complex contributes to moderate spinal canal stenosis. The disc osteophyte complex contacts and minimally flattens the ventral aspect of the spinal cord. No more than mild spinal canal narrowing at the remaining cervical levels. Multilevel foraminal stenosis, incompletely assessed on sagittal imaging. MRI THORACIC SPINE FINDINGS Alignment: No significant spondylolisthesis. Minimal bony retropulsion at the level of the T7 inferior endplate Vertebrae: Multifocal marrow signal abnormality within the thoracic spine compatible with widespread osseous metastatic disease (this is best appreciated on the sagittal T1 weighted sequences). Corresponding pathologic enhancement at multiple sites. T4 superior endplate compression fracture, with mild height loss and without bony retropulsion. Although chronic, this may reflect a pathologic fracture. T7 vertebral compression fracture (30-40% height loss). Minimal bony retropulsion at the level of the T7 inferior endplate. This likely reflects a pathologic compression fracture. There is mild edema within the T7 vertebral body, and this fracture may be subacute. Please refer to the recent prior CT chest/abdomen/pelvis for description of bilateral rib fractures. No enhancing epidural tumor is identified within the thoracic spinal canal. Cord: No signal abnormality or pathologic enhancement is appreciated within the spinal cord at the thoracic levels. There is mild  prominence of the central canal of the mid to lower thoracic levels. Paraspinal and other soft tissues: Please refer to the recent prior CT chest/abdomen/pelvis for a description of thoracic and abdominopelvic soft tissue findings. No paraspinal mass or collection. Disc levels: Thoracic spondylosis with multilevel disc degeneration, disc bulges and facet arthrosis. At T9-T10, a small central disc protrusion mildly effaces the ventral thecal sac and contacts the ventral aspect of the spinal cord. No significant spinal canal stenosis at the remaining thoracic levels. No compressive foraminal stenosis is identified. MRI LUMBAR SPINE FINDINGS Segmentation: 5 lumbar vertebrae. The caudal most well-formed intervertebral disc space is designated L5-S1. Alignment:  Significant spondylolisthesis.  Vertebrae: Multifocal signal abnormality within the lumbar spine and visualized sacrum compatible with widespread osseous metastatic disease. Corresponding pathologic enhancement at multiple sites. L3 compression fracture (25% height loss). This fracture may be pathologic. There is mild edema and enhancement along the L3 superior endplate at this site, this fracture may be subacute. No epidural tumor is identified within the lumbar spinal canal. Conus medullaris: Extends to the L1 level and appears normal. Paraspinal and other soft tissues: Please refer to the recent prior CT chest/abdomen/pelvis for description of abdominopelvic soft tissue findings. No paraspinal mass or collection. Disc levels: Lumbar spondylosis with multilevel disc degeneration, disc bulges, facet arthrosis and ligamentum flavum hypertrophy. Findings are most notably as follows. Disc degeneration is greatest to the left at L3-L4 (advanced at this site). L2-L3: Disc bulge. Facet arthrosis ligamentum flavum hypertrophy. Moderate spinal canal stenosis. Mild left neural foraminal narrowing. L3-L4: Disc bulge. Facet arthrosis and ligamentum flavum hypertrophy.  Severe bilateral subarticular and central canal stenosis. Bilateral neural foraminal narrowing (mild right, mild/moderate left). L4-L5: Disc bulge. Facet arthrosis and ligamentum flavum hypertrophy. Right subarticular narrowing with medialization of the descending right L5 nerve root. Left subarticular narrowing with crowding of the descending left L5 nerve root. Mild right subarticular narrowing. Mild to moderate central canal stenosis. Moderate right neural foraminal narrowing. L5-S1: Disc bulge. Superimposed central disc protrusion. Facet arthrosis. The disc protrusion contributes to mild bilateral subarticular narrowing (without frank nerve root impingement). No significant central canal or foraminal stenosis. IMPRESSION: Cervical spine: 1. Findings compatible with widespread osseous metastatic disease within the cervical spine. 2. No pathologic compression fracture. 3. No epidural tumor identified within the cervical spinal canal. 4. Cervical spondylosis, as described. Thoracic spine: 1. Findings compatible with widespread osseous metastatic disease within the thoracic spine. 2. T7 vertebral compression fracture (30-40% height loss). Minimal bony retropulsion at the level of the T7 inferior endplate, not resulting in significant spinal canal stenosis. This likely reflects a pathologic compression fracture. There is mild edema within the T7 vertebral body, and this fracture may be subacute. 3. Mild T4 superior endplate vertebral compression fracture. Although chronic, this fracture may be pathologic. 4. No epidural tumor identified within the thoracic spinal canal. 5. Thoracic spondylosis, as described. 6. Please refer to the recent prior CT chest/abdomen/pelvis for description of bilateral rib fractures. Lumbar spine: 1. Findings compatible with widespread osseous metastatic disease within the lumbar spine and sacrum. 2. L3 superior endplate vertebral compression fracture, with 25% height loss and without bony  retropulsion. This fracture may be pathologic. Additionally, there is mild edema and enhancement along the L3 superior endplate at this site, this fracture may be subacute. 3. No epidural tumor identified within the lumbar spinal canal. 4. Lumbar spondylosis, as described. Notably, there is multifactorial severe bilateral subarticular and central canal stenosis at L3-L4. Electronically Signed   By: Kellie Simmering D.O.   On: 03/31/2022 13:43    Scheduled Meds:  sodium chloride   Intravenous Once   calcium carbonate  1,250 mg Oral Q breakfast   Chlorhexidine Gluconate Cloth  6 each Topical Daily   cholecalciferol  1,000 Units Oral Daily   cyanocobalamin  1,000 mcg Oral Daily   feeding supplement (GLUCERNA SHAKE)  237 mL Oral TID BM   fluticasone  2 spray Each Nare Daily   folic acid  1 mg Oral Daily   furosemide  80 mg Intravenous BID   guaiFENesin  1,200 mg Oral BID   hydrocortisone   Rectal TID   letrozole  2.5  mg Oral QODAY   levothyroxine  112 mcg Oral Q0600   loratadine  10 mg Oral Daily   methocarbamol  1,000 mg Oral TID   pantoprazole  40 mg Oral Daily   potassium chloride  40 mEq Oral Daily   senna-docusate  1 tablet Oral BID   sodium bicarbonate  650 mg Oral BID   Continuous Infusions:  albumin human 12.5 g (04/02/22 1003)     LOS: 19 days   Oscar La, MD [facility] Available via Epic secure chat 7am-7pm After these hours, please refer to coverage provider listed on [email or web address] 04/02/2022, 11:12 AM

## 2022-04-03 ENCOUNTER — Telehealth: Payer: Self-pay | Admitting: *Deleted

## 2022-04-03 ENCOUNTER — Other Ambulatory Visit: Payer: Self-pay

## 2022-04-03 ENCOUNTER — Telehealth: Payer: Self-pay | Admitting: Radiation Oncology

## 2022-04-03 ENCOUNTER — Ambulatory Visit
Admit: 2022-04-03 | Discharge: 2022-04-03 | Disposition: A | Discharge status: Home / Self Care | Payer: Medicare HMO | Attending: Radiation Oncology | Admitting: Radiation Oncology

## 2022-04-03 DIAGNOSIS — N179 Acute kidney failure, unspecified: Secondary | ICD-10-CM | POA: Diagnosis not present

## 2022-04-03 DIAGNOSIS — W19XXXD Unspecified fall, subsequent encounter: Secondary | ICD-10-CM | POA: Diagnosis not present

## 2022-04-03 DIAGNOSIS — J189 Pneumonia, unspecified organism: Secondary | ICD-10-CM | POA: Diagnosis not present

## 2022-04-03 DIAGNOSIS — C7951 Secondary malignant neoplasm of bone: Secondary | ICD-10-CM | POA: Diagnosis not present

## 2022-04-03 LAB — RAD ONC ARIA SESSION SUMMARY
Course Elapsed Days: 0
Plan Fractions Treated to Date: 1
Plan Prescribed Dose Per Fraction: 3 Gy
Plan Total Fractions Prescribed: 10
Plan Total Prescribed Dose: 30 Gy
Reference Point Dosage Given to Date: 3 Gy
Reference Point Session Dosage Given: 3 Gy
Session Number: 1

## 2022-04-03 LAB — BPAM RBC
Blood Product Expiration Date: 202402172359
ISSUE DATE / TIME: 202401241526
Unit Type and Rh: 6200

## 2022-04-03 LAB — TYPE AND SCREEN
ABO/RH(D): AB POS
Antibody Screen: NEGATIVE
Unit division: 0

## 2022-04-03 MED ORDER — GLUCERNA SHAKE PO LIQD: 237.0000 mL | Freq: Three times a day (TID) | ORAL | 0 refills | Status: DC

## 2022-04-03 MED ORDER — GUAIFENESIN ER 600 MG PO TB12: 1200.0000 mg | ORAL_TABLET | Freq: Two times a day (BID) | ORAL | 0 refills | Status: AC

## 2022-04-03 MED ORDER — OXYCODONE HCL 5 MG PO TABS: 2.5000 mg | ORAL_TABLET | Freq: Four times a day (QID) | ORAL | 0 refills | Status: DC | PRN

## 2022-04-03 MED ORDER — IPRATROPIUM-ALBUTEROL 0.5-2.5 (3) MG/3ML IN SOLN: 3.0000 mL | Freq: Four times a day (QID) | RESPIRATORY_TRACT | Status: DC | PRN

## 2022-04-03 MED ORDER — LOSARTAN POTASSIUM 25 MG PO TABS: 12.5000 mg | ORAL_TABLET | Freq: Every day | ORAL | Status: DC

## 2022-04-03 MED ORDER — FLUTICASONE PROPIONATE 50 MCG/ACT NA SUSP: 2.0000 | Freq: Every day | NASAL | 2 refills | Status: DC

## 2022-04-03 MED ORDER — LETROZOLE 2.5 MG PO TABS: 2.5000 mg | ORAL_TABLET | ORAL | 1 refills | Status: DC

## 2022-04-03 MED ORDER — TORSEMIDE 40 MG PO TABS: 40.0000 mg | ORAL_TABLET | Freq: Every day | ORAL | Status: DC

## 2022-04-03 MED ORDER — LORATADINE 10 MG PO TABS: 10.0000 mg | ORAL_TABLET | Freq: Every day | ORAL | Status: DC

## 2022-04-03 MED ORDER — POTASSIUM CHLORIDE CRYS ER 20 MEQ PO TBCR: 20.0000 meq | EXTENDED_RELEASE_TABLET | Freq: Every day | ORAL | 0 refills | Status: DC

## 2022-04-03 MED ORDER — HYDROCORTISONE (PERIANAL) 2.5 % EX CREA: TOPICAL_CREAM | Freq: Three times a day (TID) | CUTANEOUS | 0 refills | Status: AC

## 2022-04-03 NOTE — Discharge Summary (Signed)
Physician Discharge Summary  Catherine Decker EHM:[id number] DOB: 09/17/1940 DOA: 03/14/2022  PCP: Antony Contras, MD  Admit date: 03/14/2022 Discharge date: 04/03/2022  Admitted From: Home  Discharge disposition: SNF   Recommendations for Outpatient Follow-Up:   Follow up with your primary care provider at the skilled nursing facility in 3 to 5 days Check CBC, BMP, magnesium in the next visit Patient will be on diuretic as outpatient and will need closer monitoring of electrolytes. Follow-up with medical oncology and radiation oncology as outpatient. Follow-up with chest CT in 6 to 8 weeks for follow-up of pneumonia.  Discharge Diagnosis:   Principal Problem:   Falls Active Problems:   Malignant neoplasm of upper-outer quadrant of right breast in female, estrogen receptor positive ([place])   DM2 (diabetes mellitus, type 2) ([place])   Multiple rib fractures   Closed fracture of spinous process of thoracic vertebra (HCC)   Hygroma   Subdural hematoma (HCC)   CAP (community acquired pneumonia)   Influenza A   AKI (acute kidney injury) ([place])   Stage 3b chronic kidney disease (CKD) (HCC)   HTN (hypertension)   HLD (hyperlipidemia)   Hypothyroidism   Lactic acidosis   Macrocytic anemia   Thrombocytopenia (HCC)   Pancytopenia (HCC)   SDH (subdural hematoma) (HCC)   Pressure injury of skin   Other pancytopenia ([place])   Carcinoma metastatic to bone with unknown primary site [facility])  Discharge Condition: Improved.  Diet recommendation: Low sodium, heart healthy.  Carbohydrate-modified.  Wound care: None.  Code status: DNR   History of Present Illness:   Patient is a 82 years old female with past medical history of anemia, cirrhosis of liver, diabetes mellitus, GERD, hepatitis, hyperlipidemia, hypertension, hypothyroidism presented to hospital after sustaining a fall from her stairs. Patient had multiple rib fractures subdural hematoma vertebral fractures. Case was discussed with  neurosurgery and trauma surgery and patient underwent conservative treatment during hospitalization. She also tested positive for streptococcal pneumonia and influenza and has completed Rocephin and Zithromax and Tamiflu course. Incidentally patient was noted to have multiple lytic bone lesions in underwent left iliac wing bone biopsy on 1/16 + with findings of metastatic carcinoma. Patient also had significant volume overload patient was started on Lasix and albumin. At this time, patient is awaiting for skilled nursing facility placement once her edema improved with diuresis and cleared by oncology and radiation oncology.   Hospital Course:   Following conditions were addressed during hospitalization as listed below,  Fall with  multiple rib fractures Spinous process fracture of T1 Subdural hematoma/chronic hygromas Status post mechanical fall.  Patient was seen by trauma, neurosurgery and and patient was treated conservatively.  Physical therapy has recommended skilled nursing facility placement at this time.   Influenza A/lobar pneumonia (RML and RLL) Had elevated lactate.  Patient tested positive for urinary pneumococcal antigen.  Has received 5-day course of Rocephin and Zithromax.  Was on Tamiflu which the patient has completed for 5 days.  Repeat chest x-ray 1/17 with low lung volumes.  Plan is to follow-up with chest CT in 6 to 8 weeks    Volume Overload bilateral pleural effusions , bilateral lower extremity edema /acute on Chronic Diastolic CHF, hypoalbuminemia 2D echocardiogram showed LV ejection fraction of 60 to 65% with grade 1 diastolic dysfunction.  Venous duplex of the lower extremity without any DVT.  Patient has received IV Lasix and albumin during hospitalization.  Low salt diet and fluid restrictions on discharge.  Patient will be prescribed diuretic.  Will be on torsemide 40 daily.  Will need to monitor BMP as outpatient.   Hyponatremia Likely secondary to volume overload.   Has improved at this time at 142.    hypokalemia Replaced and improved.  Latest potassium of 3.6.   Abnormal LFTs/Transaminitis/ history of cirrhosis with portal hypertension -Right upper quadrant ultrasound consistent with cirrhosis with portal hypertension, low-density structure in the left lobe of the liver highlighted on prior noncontrast CTs.  Hepatitis panel was negative.  Could be secondary to liver congestion from volume overload.  Will continue diuretic on discharge.Started on torsemide 40 daily.  AKI on CKD stage IIIb Metabolic Acidosis,  Renal ultrasound was negative for hydronephrosis.  Urinalysis showed some possibility of UTI but no positive cultures.  Received antibiotic for pneumonia recently.  Creatinine prior to discharge was 1.8.  Will continue diuretic on discharge   history of Breast Cancer S/p right mastectomy continue letrozole    New multiple bone lytic lesions, extensive metastatic disease. CT imaging  on presentation showed bone lesions concerning for metastasis, lytic lesion in T1 vertebral body, left iliac wing lytic lesion and right acromion.  Patient underwent CT-guided biopsy with findings of metastatic carcinoma. MRI of the cervical thoracic and lumbar shows multiple metastatic lesions.  Patient has expressed no wish for chemotherapy.  Has been seen by radiation oncology for radiation treatment.  Communicated with Dr. Lorenso Courier heme at oncology who is waiting on additional testing on her tumor.  Will need outpatient follow-up with radiation oncology and medical oncology.   Pancytopenia, likely multifactorial, h/o cirrhosis and currently concerning for metastatic cancer Being followed by heme oncology as outpatient setting.     Anemia status post 1 unit of packed RBC for hemoglobin of 6.4 yesterday.  Hemoglobin today at 8.0.  Essential hypertension Resume losartan on discharge in a few days..   Hyperlipidemia Statin and fenofibrate discontinued due to elevated  LFT.    Diabetes Mellitus Type 2, non-insulin-dependent, currently diet controlled Hemoglobin A1c of 4.9 on 03/16/2022.  Actos has been discontinued due to edema.  Continue diet control.   Hypothyroidism Continue Synthroid   Grade 1 obesity.Body mass index is 29.21 kg/m.  Would benefit from weight loss as outpatient.  Pressure injury, stage I medial buttocks, POA Continue pressure ulcer prevention protocol.  Disposition.  At this time, patient is stable for disposition to skilled nursing facility with outpatient medical oncology, radiation oncology follow-up.  Medical Consultants:   Medical oncology Radiation oncology General surgery/trauma Neurosurgery Interventional radiology  Procedures:    Lytic iliac Wing Bone Marrow biopsy 03/25/2022 PRBC transfusion   Subjective:   Today, patient was seen and examined at bedside.  Denies overt pain, nausea, vomiting, fever, chills or shortness of breath.  Has had good diuresis.  Received PRBC transfusion yesterday.  Discharge Exam:   Vitals:   04/02/22 2112 04/03/22 0405  BP: (!) 117/46 (!) 125/44  Pulse: 78 87  Resp: 16 20  Temp: 98.2 F (36.8 C) 98.1 F (36.7 C)  SpO2: 99% 95%   Vitals:   04/02/22 1836 04/02/22 2112 04/03/22 0405 04/03/22 0413  BP: (!) 123/38 (!) 117/46 (!) 125/44   Pulse: 85 78 87   Resp: '16 16 20   '$ Temp: 98 F (36.7 C) 98.2 F (36.8 C) 98.1 F (36.7 C)   TempSrc: Oral Oral Oral   SpO2: 99% 99% 95%   Weight:    73.9 kg  Height:       General: Alert awake, not in obvious distress,  Communicative, HENT: pupils equally reacting to light, mild pallor noted. Oral mucosa is moist.  Chest:   Diminished breath sounds bilaterally. No crackles or wheezes.  CVS: S1 &S2 heard. No murmur.  Regular rate and rhythm. Abdomen: Soft, nontender, mildly distended abdomen, bowel sounds are heard.   Extremities: No cyanosis, clubbing with peripheral edema.  Peripheral pulses are palpable. Psych: Alert, awake and  oriented, normal mood CNS:  No cranial nerve deficits.  Power equal in all extremities.   Skin: Warm and dry.  No rashes noted.  The results of significant diagnostics from this hospitalization (including imaging, microbiology, ancillary and laboratory) are listed below for reference.     Diagnostic Studies:   No results found.   Labs:   Basic Metabolic Panel: Recent Labs  Lab 03/29/22 0450 03/30/22 0543 03/31/22 0420 04/01/22 0410 04/02/22 0402  NA 137 140 141 138 142  K 3.9 3.7 3.7 3.8 3.6  CL 108 109 110 106 107  CO2 20* '24 22 23 22  '$ GLUCOSE 90 95 89 98 98  BUN 42* 43* 41* 48* 44*  CREATININE 1.78* 1.66* 1.76* 1.75* 1.89*  CALCIUM 8.3* 8.3* 8.7* 8.6* 8.7*  MG 2.3  --  2.0 1.9 1.7  PHOS 3.6  --   --   --   --    GFR Estimated Creatinine Clearance: 22.9 mL/min (A) (by C-G formula based on SCr of 1.89 mg/dL (H)). Liver Function Tests: Recent Labs  Lab 03/28/22 0406 03/29/22 0450 03/30/22 0543 03/31/22 0420 04/01/22 0410  AST 144* 135* 125* 134* 133*  ALT 37 34 31 34 34  ALKPHOS 203* 172* 185* 182* 174*  BILITOT 1.6* 1.8* 1.7* 2.0* 2.3*  PROT 5.5* 5.2* 5.5* 6.0* 6.1*  ALBUMIN 2.3* 2.3* 2.6* 2.9* 3.0*   No results for input(s): "LIPASE", "AMYLASE" in the last 168 hours. No results for input(s): "AMMONIA" in the last 168 hours. Coagulation profile No results for input(s): "INR", "PROTIME" in the last 168 hours.  CBC: Recent Labs  Lab 03/29/22 0450 03/30/22 0543 03/31/22 0420 04/01/22 0410 04/02/22 0402 04/02/22 2033  WBC 3.0* 2.4* 2.7* 2.8* 2.3*  --   NEUTROABS 2.2  --   --   --   --   --   HGB 7.2* 7.1* 7.0* 7.4* 6.4* 8.0*  HCT 22.2* 21.9* 22.3* 23.4* 20.7* 25.5*  MCV 107.2* 108.4* 108.3* 109.3* 111.9*  --   PLT 72* 69* 66* 65* 54*  --    Cardiac Enzymes: Recent Labs  Lab 03/31/22 0420  CKTOTAL 26*   BNP: Invalid input(s): "POCBNP" CBG: No results for input(s): "GLUCAP" in the last 168 hours. D-Dimer No results for input(s): "DDIMER" in  the last 72 hours. Hgb A1c No results for input(s): "HGBA1C" in the last 72 hours. Lipid Profile No results for input(s): "CHOL", "HDL", "LDLCALC", "TRIG", "CHOLHDL", "LDLDIRECT" in the last 72 hours. Thyroid function studies No results for input(s): "TSH", "T4TOTAL", "T3FREE", "THYROIDAB" in the last 72 hours.  Invalid input(s): "FREET3" Anemia work up No results for input(s): "VITAMINB12", "FOLATE", "FERRITIN", "TIBC", "IRON", "RETICCTPCT" in the last 72 hours. Microbiology No results found for this or any previous visit (from the past 240 hour(s)).   Discharge Instructions:   Discharge Instructions     Call MD for:  persistant nausea and vomiting   Complete by: As directed    Call MD for:  severe uncontrolled pain   Complete by: As directed    Diet Carb Modified   Complete by: As directed  Discharge instructions   Complete by: As directed    Follow-up with your primary care provider at the skilled nursing facility in 3 to 5 days.  Check blood work at that time.  Follow-up with radiation oncology and medical oncology as outpatient   Increase activity slowly   Complete by: As directed    No wound care   Complete by: As directed       Allergies as of 04/03/2022       Reactions   Demerol Anaphylaxis   Meperidine Anaphylaxis   Meperidine Hcl Other (See Comments)        Medication List     STOP taking these medications    celecoxib 200 MG capsule Commonly known as: CELEBREX   fenofibrate 160 MG tablet   pioglitazone 15 MG tablet Commonly known as: ACTOS   simvastatin 20 MG tablet Commonly known as: ZOCOR       TAKE these medications    calcium carbonate 600 MG Tabs tablet Commonly known as: OS-CAL Take 600 mg by mouth daily with breakfast.   cholecalciferol 25 MCG (1000 UNIT) tablet Commonly known as: VITAMIN D3 Take 1,000 Units by mouth daily.   CoQ10 100 MG Caps Take 1 capsule by mouth daily.   cyanocobalamin 1000 MCG tablet Commonly known  as: VITAMIN B12 Take 1,000 mcg by mouth daily.   feeding supplement (GLUCERNA SHAKE) Liqd Take 237 mLs by mouth 3 (three) times daily between meals.   Fish Oil 600 MG Caps Take 600 mg by mouth daily.   fluticasone 50 MCG/ACT nasal spray Commonly known as: FLONASE Place 2 sprays into both nostrils daily.   folic acid 1 MG tablet Commonly known as: FOLVITE Take 1 tablet (1 mg total) by mouth daily.   Ginkgo Biloba 60 MG Caps Take 60 mg by mouth daily.   guaiFENesin 600 MG 12 hr tablet Commonly known as: MUCINEX Take 2 tablets (1,200 mg total) by mouth 2 (two) times daily for 5 days.   hydrocortisone 2.5 % rectal cream Commonly known as: ANUSOL-HC Place rectally 3 (three) times daily for 5 days.   ipratropium-albuterol 0.5-2.5 (3) MG/3ML Soln Commonly known as: DUONEB Take 3 mLs by nebulization every 6 (six) hours as needed (shotrness of breath or wheezing).   letrozole 2.5 MG tablet Commonly known as: FEMARA Take 1 tablet (2.5 mg total) by mouth every other day. What changed: when to take this   levothyroxine 112 MCG tablet Commonly known as: SYNTHROID Take 112 mcg by mouth daily before breakfast.   loratadine 10 MG tablet Commonly known as: CLARITIN Take 1 tablet (10 mg total) by mouth daily.   losartan 25 MG tablet Commonly known as: COZAAR Take 0.5 tablets (12.5 mg total) by mouth daily. Start taking on: April 07, 2022 What changed: These instructions start on April 07, 2022. If you are unsure what to do until then, ask your doctor or other care provider.   omeprazole 20 MG capsule Commonly known as: PRILOSEC Take 20 mg by mouth daily as needed (acid reflux).   ONETOUCH ULTRA BLUE VI daily.   oxyCODONE 5 MG immediate release tablet Commonly known as: Oxy IR/ROXICODONE Take 0.5-1 tablets (2.5-5 mg total) by mouth every 6 (six) hours as needed for moderate pain or severe pain.   potassium chloride SA 20 MEQ tablet Commonly known as: KLOR-CON M Take 1  tablet (20 mEq total) by mouth daily for 15 days.   Torsemide 40 MG Tabs Take 40 mg by mouth daily  after breakfast.        Contact information for after-discharge care     Destination     [facility] Preferred SNF .   Service: Skilled Nursing Contact information: [place] [phone]                      Time coordinating discharge: 39 minutes  Signed:  Elecia Serafin  [facility] 04/03/2022, 10:46 AM

## 2022-04-03 NOTE — Telephone Encounter (Signed)
I spoke with the patient. She is getting set up for discharge today after her radiation. She will resume tomorrow. I reviewed that the pathology IHC confirmed a breast malignancy in her bone biopsy from last week. She will continue letrozole and follow up with Dr. Lorenso Courier for any further discussion about systemic therapy. She is in agreement with this plan.

## 2022-04-03 NOTE — TOC Transition Note (Signed)
Transition of Care [facility]) - CM/SW Discharge Note   Patient Details  Name: Catherine Decker MRN: [id number] Date of Birth: 27-Aug-1940  [facility] CM/SW Contact:  Illene Regulus, LCSW Phone Number: 04/03/2022, 11:46 AM   Clinical Narrative:    Pt to d/c to [facility]. CSW spoke with daughter to inform her of transport. Call report [phone]. PTAR called , TOC sign off.   Final next level of care: Skilled Nursing Facility Barriers to Discharge: No Barriers Identified   Patient Goals and CMS Choice CMS Medicare.gov Compare Post Acute Care list provided to:: Patient Represenative (must comment) Choice offered to / list presented to : Adult Children  Discharge Placement                    Name of family member notified: Blackley,[name] (Daughter) Patient and family notified of of transfer: 04/03/22  Discharge Plan and Services Additional resources added to the After Visit Summary for                                       Social Determinants of Health (SDOH) Interventions SDOH Screenings   Food Insecurity: No Food Insecurity (03/14/2022)  Housing: Low Risk  (03/14/2022)  Transportation Needs: No Transportation Needs (03/14/2022)  Utilities: Not At Risk (03/14/2022)  Tobacco Use: Low Risk  (03/14/2022)     Readmission Risk Interventions     No data to display

## 2022-04-03 NOTE — Telephone Encounter (Signed)
Pt is being discharged today from hospital to [facility]. Is going to be receiving RT daily. Message to scheduler to get an appt next week if possible with Dr Lorenso Courier while she is here. Daughter will come with her so they can discuss options. MD at hospital recommended chemo, but they decided not to put her through chemo. Would like to discuss Hospice if Dr Lorenso Courier feels that is appropriate.

## 2022-04-03 NOTE — Progress Notes (Signed)
Report called and given to [place] at [phone].  Catherine Decker

## 2022-04-03 NOTE — Plan of Care (Signed)
  Problem: Education: Goal: Knowledge of General Education information will improve Description: Including pain rating scale, medication(s)/side effects and non-pharmacologic comfort measures Outcome: Progressing   Problem: Health Behavior/Discharge Planning: Goal: Ability to manage health-related needs will improve Outcome: Progressing

## 2022-04-04 ENCOUNTER — Other Ambulatory Visit: Payer: Self-pay

## 2022-04-04 ENCOUNTER — Telehealth: Payer: Self-pay | Admitting: Hematology and Oncology

## 2022-04-04 ENCOUNTER — Ambulatory Visit
Admission: RE | Admit: 2022-04-04 | Discharge: 2022-04-04 | Disposition: A | Discharge status: Home / Self Care | Payer: Medicare HMO | Source: Ambulatory Visit | Attending: Radiation Oncology | Admitting: Radiation Oncology

## 2022-04-04 DIAGNOSIS — C7951 Secondary malignant neoplasm of bone: Secondary | ICD-10-CM | POA: Diagnosis present

## 2022-04-04 DIAGNOSIS — Z510 Encounter for antineoplastic radiation therapy: Secondary | ICD-10-CM | POA: Diagnosis present

## 2022-04-04 DIAGNOSIS — Z853 Personal history of malignant neoplasm of breast: Secondary | ICD-10-CM | POA: Insufficient documentation

## 2022-04-04 LAB — RAD ONC ARIA SESSION SUMMARY
Course Elapsed Days: 1
Plan Fractions Treated to Date: 2
Plan Prescribed Dose Per Fraction: 3 Gy
Plan Total Fractions Prescribed: 10
Plan Total Prescribed Dose: 30 Gy
Reference Point Dosage Given to Date: 6 Gy
Reference Point Session Dosage Given: 3 Gy
Session Number: 2

## 2022-04-04 NOTE — Telephone Encounter (Signed)
Scheduled 02/02 per inbasket message and per providers nurse request. Called and spoke with patient's daughter, patient will be notified.

## 2022-04-07 ENCOUNTER — Other Ambulatory Visit: Payer: Self-pay

## 2022-04-07 ENCOUNTER — Other Ambulatory Visit (HOSPITAL_COMMUNITY): Payer: Self-pay | Admitting: Interventional Radiology

## 2022-04-07 ENCOUNTER — Ambulatory Visit
Admission: RE | Admit: 2022-04-07 | Discharge: 2022-04-07 | Disposition: A | Discharge status: Home / Self Care | Payer: Medicare HMO | Source: Ambulatory Visit | Attending: Radiation Oncology | Admitting: Radiation Oncology

## 2022-04-07 DIAGNOSIS — Z515 Encounter for palliative care: Secondary | ICD-10-CM | POA: Diagnosis not present

## 2022-04-07 DIAGNOSIS — C7951 Secondary malignant neoplasm of bone: Secondary | ICD-10-CM

## 2022-04-07 DIAGNOSIS — M545 Low back pain, unspecified: Secondary | ICD-10-CM

## 2022-04-07 DIAGNOSIS — K7682 Hepatic encephalopathy: Secondary | ICD-10-CM | POA: Diagnosis not present

## 2022-04-07 LAB — RAD ONC ARIA SESSION SUMMARY
Course Elapsed Days: 4
Plan Fractions Treated to Date: 3
Plan Prescribed Dose Per Fraction: 3 Gy
Plan Total Fractions Prescribed: 10
Plan Total Prescribed Dose: 30 Gy
Reference Point Dosage Given to Date: 9 Gy
Reference Point Session Dosage Given: 3 Gy
Session Number: 3

## 2022-04-08 ENCOUNTER — Ambulatory Visit
Admission: RE | Admit: 2022-04-08 | Discharge: 2022-04-08 | Disposition: A | Discharge status: Home / Self Care | Payer: Medicare HMO | Source: Ambulatory Visit | Attending: Radiation Oncology | Admitting: Radiation Oncology

## 2022-04-08 ENCOUNTER — Other Ambulatory Visit: Payer: Self-pay

## 2022-04-08 ENCOUNTER — Other Ambulatory Visit (HOSPITAL_COMMUNITY): Payer: Self-pay | Admitting: Interventional Radiology

## 2022-04-08 DIAGNOSIS — M545 Low back pain, unspecified: Secondary | ICD-10-CM

## 2022-04-08 DIAGNOSIS — C7951 Secondary malignant neoplasm of bone: Secondary | ICD-10-CM

## 2022-04-08 LAB — RAD ONC ARIA SESSION SUMMARY
Course Elapsed Days: 5
Plan Fractions Treated to Date: 4
Plan Prescribed Dose Per Fraction: 3 Gy
Plan Total Fractions Prescribed: 10
Plan Total Prescribed Dose: 30 Gy
Reference Point Dosage Given to Date: 12 Gy
Reference Point Session Dosage Given: 3 Gy
Session Number: 4

## 2022-04-09 ENCOUNTER — Inpatient Hospital Stay (HOSPITAL_COMMUNITY)
Admission: EM | Admit: 2022-04-09 | Discharge: 2022-04-10 | DRG: 951 | Disposition: A | Discharge status: Hospice | Payer: Medicare HMO | Source: Skilled Nursing Facility | Attending: Internal Medicine | Admitting: Internal Medicine

## 2022-04-09 ENCOUNTER — Emergency Department (HOSPITAL_COMMUNITY): Payer: Medicare HMO

## 2022-04-09 ENCOUNTER — Other Ambulatory Visit: Payer: Self-pay

## 2022-04-09 ENCOUNTER — Encounter (HOSPITAL_COMMUNITY): Payer: Self-pay

## 2022-04-09 ENCOUNTER — Ambulatory Visit: Payer: Medicare HMO

## 2022-04-09 DIAGNOSIS — Z961 Presence of intraocular lens: Secondary | ICD-10-CM | POA: Diagnosis present

## 2022-04-09 DIAGNOSIS — S2243XD Multiple fractures of ribs, bilateral, subsequent encounter for fracture with routine healing: Secondary | ICD-10-CM

## 2022-04-09 DIAGNOSIS — R451 Restlessness and agitation: Secondary | ICD-10-CM | POA: Diagnosis not present

## 2022-04-09 DIAGNOSIS — E039 Hypothyroidism, unspecified: Secondary | ICD-10-CM | POA: Diagnosis not present

## 2022-04-09 DIAGNOSIS — Z853 Personal history of malignant neoplasm of breast: Secondary | ICD-10-CM

## 2022-04-09 DIAGNOSIS — Z515 Encounter for palliative care: Secondary | ICD-10-CM

## 2022-04-09 DIAGNOSIS — E785 Hyperlipidemia, unspecified: Secondary | ICD-10-CM | POA: Diagnosis present

## 2022-04-09 DIAGNOSIS — D539 Nutritional anemia, unspecified: Secondary | ICD-10-CM | POA: Diagnosis present

## 2022-04-09 DIAGNOSIS — K759 Inflammatory liver disease, unspecified: Secondary | ICD-10-CM | POA: Diagnosis present

## 2022-04-09 DIAGNOSIS — G9341 Metabolic encephalopathy: Secondary | ICD-10-CM | POA: Diagnosis present

## 2022-04-09 DIAGNOSIS — W19XXXD Unspecified fall, subsequent encounter: Secondary | ICD-10-CM | POA: Diagnosis present

## 2022-04-09 DIAGNOSIS — Z8249 Family history of ischemic heart disease and other diseases of the circulatory system: Secondary | ICD-10-CM

## 2022-04-09 DIAGNOSIS — I130 Hypertensive heart and chronic kidney disease with heart failure and stage 1 through stage 4 chronic kidney disease, or unspecified chronic kidney disease: Secondary | ICD-10-CM | POA: Diagnosis present

## 2022-04-09 DIAGNOSIS — Z923 Personal history of irradiation: Secondary | ICD-10-CM

## 2022-04-09 DIAGNOSIS — Z9011 Acquired absence of right breast and nipple: Secondary | ICD-10-CM

## 2022-04-09 DIAGNOSIS — N1832 Chronic kidney disease, stage 3b: Secondary | ICD-10-CM | POA: Diagnosis not present

## 2022-04-09 DIAGNOSIS — Z79899 Other long term (current) drug therapy: Secondary | ICD-10-CM | POA: Diagnosis not present

## 2022-04-09 DIAGNOSIS — D509 Iron deficiency anemia, unspecified: Secondary | ICD-10-CM | POA: Diagnosis present

## 2022-04-09 DIAGNOSIS — Z9049 Acquired absence of other specified parts of digestive tract: Secondary | ICD-10-CM

## 2022-04-09 DIAGNOSIS — K7460 Unspecified cirrhosis of liver: Secondary | ICD-10-CM | POA: Diagnosis present

## 2022-04-09 DIAGNOSIS — E119 Type 2 diabetes mellitus without complications: Secondary | ICD-10-CM | POA: Diagnosis not present

## 2022-04-09 DIAGNOSIS — Z7401 Bed confinement status: Secondary | ICD-10-CM

## 2022-04-09 DIAGNOSIS — F419 Anxiety disorder, unspecified: Secondary | ICD-10-CM | POA: Diagnosis present

## 2022-04-09 DIAGNOSIS — Z1152 Encounter for screening for COVID-19: Secondary | ICD-10-CM | POA: Diagnosis not present

## 2022-04-09 DIAGNOSIS — Z9841 Cataract extraction status, right eye: Secondary | ICD-10-CM

## 2022-04-09 DIAGNOSIS — K219 Gastro-esophageal reflux disease without esophagitis: Secondary | ICD-10-CM | POA: Diagnosis present

## 2022-04-09 DIAGNOSIS — Z833 Family history of diabetes mellitus: Secondary | ICD-10-CM

## 2022-04-09 DIAGNOSIS — Z66 Do not resuscitate: Secondary | ICD-10-CM | POA: Diagnosis present

## 2022-04-09 DIAGNOSIS — E1122 Type 2 diabetes mellitus with diabetic chronic kidney disease: Secondary | ICD-10-CM | POA: Diagnosis present

## 2022-04-09 DIAGNOSIS — Z803 Family history of malignant neoplasm of breast: Secondary | ICD-10-CM

## 2022-04-09 DIAGNOSIS — I10 Essential (primary) hypertension: Secondary | ICD-10-CM | POA: Diagnosis present

## 2022-04-09 DIAGNOSIS — Z885 Allergy status to narcotic agent status: Secondary | ICD-10-CM | POA: Diagnosis not present

## 2022-04-09 DIAGNOSIS — I5032 Chronic diastolic (congestive) heart failure: Secondary | ICD-10-CM | POA: Diagnosis present

## 2022-04-09 DIAGNOSIS — E860 Dehydration: Secondary | ICD-10-CM

## 2022-04-09 DIAGNOSIS — D630 Anemia in neoplastic disease: Secondary | ICD-10-CM | POA: Diagnosis present

## 2022-04-09 DIAGNOSIS — R52 Pain, unspecified: Secondary | ICD-10-CM

## 2022-04-09 DIAGNOSIS — D696 Thrombocytopenia, unspecified: Secondary | ICD-10-CM | POA: Diagnosis not present

## 2022-04-09 DIAGNOSIS — K7682 Hepatic encephalopathy: Principal | ICD-10-CM | POA: Diagnosis present

## 2022-04-09 DIAGNOSIS — Z83438 Family history of other disorder of lipoprotein metabolism and other lipidemia: Secondary | ICD-10-CM

## 2022-04-09 DIAGNOSIS — Z7189 Other specified counseling: Secondary | ICD-10-CM

## 2022-04-09 DIAGNOSIS — Z8042 Family history of malignant neoplasm of prostate: Secondary | ICD-10-CM

## 2022-04-09 DIAGNOSIS — C50411 Malignant neoplasm of upper-outer quadrant of right female breast: Secondary | ICD-10-CM | POA: Diagnosis not present

## 2022-04-09 DIAGNOSIS — C799 Secondary malignant neoplasm of unspecified site: Secondary | ICD-10-CM | POA: Diagnosis present

## 2022-04-09 DIAGNOSIS — Z9842 Cataract extraction status, left eye: Secondary | ICD-10-CM

## 2022-04-09 DIAGNOSIS — Z170 Estrogen receptor positive status [ER+]: Secondary | ICD-10-CM

## 2022-04-09 DIAGNOSIS — R4589 Other symptoms and signs involving emotional state: Secondary | ICD-10-CM | POA: Diagnosis not present

## 2022-04-09 DIAGNOSIS — Z87820 Personal history of traumatic brain injury: Secondary | ICD-10-CM

## 2022-04-09 DIAGNOSIS — N179 Acute kidney failure, unspecified: Secondary | ICD-10-CM | POA: Diagnosis not present

## 2022-04-09 DIAGNOSIS — Z800 Family history of malignant neoplasm of digestive organs: Secondary | ICD-10-CM

## 2022-04-09 LAB — URINALYSIS, W/ REFLEX TO CULTURE (INFECTION SUSPECTED)
Bilirubin Urine: NEGATIVE
Glucose, UA: NEGATIVE mg/dL
Hgb urine dipstick: NEGATIVE
Ketones, ur: NEGATIVE mg/dL
Leukocytes,Ua: NEGATIVE
Nitrite: NEGATIVE
Protein, ur: NEGATIVE mg/dL
Specific Gravity, Urine: 1.019 (ref 1.005–1.030)
pH: 5 (ref 5.0–8.0)

## 2022-04-09 LAB — CBC WITH DIFFERENTIAL/PLATELET
Abs Immature Granulocytes: 0.04 10*3/uL (ref 0.00–0.07)
Basophils Absolute: 0 10*3/uL (ref 0.0–0.1)
Basophils Relative: 1 %
Eosinophils Absolute: 0.1 10*3/uL (ref 0.0–0.5)
Eosinophils Relative: 2 %
HCT: 28 % — ABNORMAL LOW (ref 36.0–46.0)
Hemoglobin: 8.6 g/dL — ABNORMAL LOW (ref 12.0–15.0)
Immature Granulocytes: 1 %
Lymphocytes Relative: 8 %
Lymphs Abs: 0.3 10*3/uL — ABNORMAL LOW (ref 0.7–4.0)
MCH: 33.7 pg (ref 26.0–34.0)
MCHC: 30.7 g/dL (ref 30.0–36.0)
MCV: 109.8 fL — ABNORMAL HIGH (ref 80.0–100.0)
Monocytes Absolute: 0.4 10*3/uL (ref 0.1–1.0)
Monocytes Relative: 9 %
Neutro Abs: 3.2 10*3/uL (ref 1.7–7.7)
Neutrophils Relative %: 79 %
Platelets: 99 10*3/uL — ABNORMAL LOW (ref 150–400)
RBC: 2.55 MIL/uL — ABNORMAL LOW (ref 3.87–5.11)
RDW: 21.9 % — ABNORMAL HIGH (ref 11.5–15.5)
WBC: 4 10*3/uL (ref 4.0–10.5)
nRBC: 0 % (ref 0.0–0.2)

## 2022-04-09 LAB — AMMONIA: Ammonia: 111 umol/L — ABNORMAL HIGH (ref 9–35)

## 2022-04-09 LAB — COMPREHENSIVE METABOLIC PANEL
ALT: 46 U/L — ABNORMAL HIGH (ref 0–44)
AST: 147 U/L — ABNORMAL HIGH (ref 15–41)
Albumin: 3.4 g/dL — ABNORMAL LOW (ref 3.5–5.0)
Alkaline Phosphatase: 191 U/L — ABNORMAL HIGH (ref 38–126)
Anion gap: 14 (ref 5–15)
BUN: 47 mg/dL — ABNORMAL HIGH (ref 8–23)
CO2: 20 mmol/L — ABNORMAL LOW (ref 22–32)
Calcium: 8.6 mg/dL — ABNORMAL LOW (ref 8.9–10.3)
Chloride: 106 mmol/L (ref 98–111)
Creatinine, Ser: 2.39 mg/dL — ABNORMAL HIGH (ref 0.44–1.00)
GFR, Estimated: 20 mL/min — ABNORMAL LOW (ref >60)
Glucose, Bld: 141 mg/dL — ABNORMAL HIGH (ref 70–99)
Potassium: 3.7 mmol/L (ref 3.5–5.1)
Sodium: 140 mmol/L (ref 135–145)
Total Bilirubin: 3.4 mg/dL — ABNORMAL HIGH (ref 0.3–1.2)
Total Protein: 6.7 g/dL (ref 6.5–8.1)

## 2022-04-09 LAB — PROTIME-INR
INR: 1.6 — ABNORMAL HIGH (ref 0.8–1.2)
Prothrombin Time: 19.2 seconds — ABNORMAL HIGH (ref 11.4–15.2)

## 2022-04-09 LAB — RESP PANEL BY RT-PCR (RSV, FLU A&B, COVID)  RVPGX2
Influenza A by PCR: NEGATIVE
Influenza B by PCR: NEGATIVE
Resp Syncytial Virus by PCR: NEGATIVE
SARS Coronavirus 2 by RT PCR: NEGATIVE

## 2022-04-09 LAB — BRAIN NATRIURETIC PEPTIDE: B Natriuretic Peptide: 49.2 pg/mL (ref 0.0–100.0)

## 2022-04-09 LAB — TROPONIN I (HIGH SENSITIVITY)
Troponin I (High Sensitivity): 16 ng/L (ref <18)
Troponin I (High Sensitivity): 16 ng/L (ref <18)

## 2022-04-09 LAB — CBG MONITORING, ED: Glucose-Capillary: 193 mg/dL — ABNORMAL HIGH (ref 70–99)

## 2022-04-09 LAB — MAGNESIUM: Magnesium: 2.1 mg/dL (ref 1.7–2.4)

## 2022-04-09 MED ORDER — ACETAMINOPHEN 650 MG RE SUPP: 650.0000 mg | Freq: Four times a day (QID) | RECTAL | Status: DC | PRN

## 2022-04-09 MED ORDER — HYDROMORPHONE HCL 1 MG/ML IJ SOLN: 0.3000 mg | INTRAMUSCULAR | Status: DC | PRN

## 2022-04-09 MED ORDER — LACTULOSE ENEMA
300.0000 mL | Freq: Once | ORAL | Status: DC
  Filled 2022-04-09: qty 300

## 2022-04-09 MED ORDER — BIOTENE DRY MOUTH MT LIQD: 15.0000 mL | OROMUCOSAL | Status: DC | PRN

## 2022-04-09 MED ORDER — POLYVINYL ALCOHOL 1.4 % OP SOLN: 1.0000 [drp] | Freq: Four times a day (QID) | OPHTHALMIC | Status: DC | PRN

## 2022-04-09 MED ORDER — SODIUM CHLORIDE 0.9 % IV BOLUS
500.0000 mL | Freq: Once | INTRAVENOUS | Status: AC
  Administered 2022-04-09: 500 mL via INTRAVENOUS

## 2022-04-09 MED ORDER — HALOPERIDOL LACTATE 5 MG/ML IJ SOLN
1.0000 mg | INTRAMUSCULAR | Status: DC | PRN
  Administered 2022-04-09: 1 mg via INTRAVENOUS
  Filled 2022-04-09: qty 1

## 2022-04-09 MED ORDER — LORAZEPAM 2 MG/ML IJ SOLN
1.0000 mg | INTRAMUSCULAR | Status: DC | PRN
  Administered 2022-04-09 – 2022-04-10 (×2): 1 mg via INTRAVENOUS
  Filled 2022-04-09 (×2): qty 1

## 2022-04-09 MED ORDER — GLYCOPYRROLATE 0.2 MG/ML IJ SOLN: 0.2000 mg | INTRAMUSCULAR | Status: DC | PRN

## 2022-04-09 MED ORDER — HYDROMORPHONE HCL 1 MG/ML IJ SOLN
0.3000 mg | INTRAMUSCULAR | Status: DC | PRN
  Administered 2022-04-10 (×2): 0.3 mg via INTRAVENOUS
  Filled 2022-04-09 (×2): qty 0.5

## 2022-04-09 MED ORDER — HALOPERIDOL LACTATE 5 MG/ML IJ SOLN
2.0000 mg | Freq: Once | INTRAMUSCULAR | Status: AC
  Administered 2022-04-09: 2 mg via INTRAVENOUS
  Filled 2022-04-09: qty 1

## 2022-04-09 NOTE — ED Provider Notes (Signed)
[place] AT [facility] Provider Note   CSN: [id number] Arrival date & time: 04/09/22  0805     History  Chief Complaint  Patient presents with   Weakness    Catherine Decker is a 82 y.o. female.  HPI 82 year old female presents from her facility with change in mental status.  Reportedly last known well was 9 PM according to the facility.  Patient is normally ambulatory and talking though is not speaking currently and seems confused compared to her baseline. Facility also mentioned to EMS that her abdomen seems distended.  She was recently admitted to the hospital.  She has a known history of diabetes, cirrhosis, hypothyroidism, subdural hematoma, CKD.  She is also currently being treated for breast cancer.  I talked to the daughter, she feels like that the patient was abnormal last night around 9 PM after talking to the facility.  She normally walks and talks normally.  Home Medications Prior to Admission medications   Medication Sig Start Date End Date Taking? Authorizing Provider  calcium carbonate (OS-CAL) 600 MG TABS tablet Take 600 mg by mouth daily with breakfast.   Yes [provider]  cholecalciferol (VITAMIN D3) 25 MCG (1000 UNIT) tablet Take 1,000 Units by mouth daily.   Yes [provider]  Coenzyme Q10 (COQ-10 PO) Take 1 capsule by mouth daily.   Yes [provider]  levothyroxine (SYNTHROID) 112 MCG tablet Take 112 mcg by mouth daily before breakfast.   Yes [provider]  loratadine (CLARITIN) 10 MG tablet Take 1 tablet (10 mg total) by mouth daily. 04/03/22  Yes Pokhrel, Laxman, MD  losartan (COZAAR) 25 MG tablet Take 0.5 tablets (12.5 mg total) by mouth daily. 04/07/22  Yes Pokhrel, Laxman, MD  omeprazole (PRILOSEC) 20 MG capsule Take 20 mg by mouth daily as needed (acid reflux).    Yes [provider]  oxyCODONE (OXY IR/ROXICODONE) 5 MG immediate release tablet Take 0.5-1 tablets (2.5-5 mg  total) by mouth every 6 (six) hours as needed for moderate pain or severe pain. Patient taking differently: Take 5 mg by mouth every 6 (six) hours as needed for moderate pain or severe pain. 04/03/22  Yes Pokhrel, Laxman, MD  Potassium Chloride ER 20 MEQ TBCR Take 20 mEq by mouth daily. 04/04/22 04/19/22 Yes [provider]  torsemide (DEMADEX) 20 MG tablet Take 40 mg by mouth daily.   Yes [provider]  vitamin B-12 (CYANOCOBALAMIN) 1000 MCG tablet Take 1,000 mcg by mouth daily.   Yes [provider]  feeding supplement, GLUCERNA SHAKE, (GLUCERNA SHAKE) LIQD Take 237 mLs by mouth 3 (three) times daily between meals. 04/03/22   Pokhrel, Corrie Mckusick, MD  fluticasone (FLONASE) 50 MCG/ACT nasal spray Place 2 sprays into both nostrils daily. 04/03/22   Pokhrel, Corrie Mckusick, MD  folic acid (FOLVITE) 1 MG tablet Take 1 tablet (1 mg total) by mouth daily. 12/12/19   Orson Slick, MD  Ginkgo Biloba 60 MG CAPS Take 60 mg by mouth daily.    [provider]  Glucose Blood (ONETOUCH ULTRA BLUE VI) daily.    [provider]  ipratropium-albuterol (DUONEB) 0.5-2.5 (3) MG/3ML SOLN Take 3 mLs by nebulization every 6 (six) hours as needed (shotrness of breath or wheezing). 04/03/22   Pokhrel, Corrie Mckusick, MD  letrozole (FEMARA) 2.5 MG tablet Take 1 tablet (2.5 mg total) by mouth every other day. 04/03/22   Pokhrel, Corrie Mckusick, MD  potassium chloride SA (KLOR-CON M) 20 MEQ tablet  Take 1 tablet (20 mEq total) by mouth daily for 15 days. Patient not taking: Reported on 04/09/2022 04/03/22 04/18/22  Flora Lipps, MD      Allergies    Demerol and Meperidine    Review of Systems   Review of Systems  Unable to perform ROS: Mental status change    Physical Exam Updated Vital Signs BP 133/73   Pulse (!) 113   Temp 97.9 F (36.6 C)   Resp (!) 27   Ht '5\' 3"'$  (1.6 m)   Wt 74 kg   SpO2 98%   BMI 28.90 kg/m  Physical Exam Vitals and nursing note reviewed.  Constitutional:       Appearance: She is well-developed.  HENT:     Head: Normocephalic and atraumatic.  Eyes:     Pupils: Pupils are equal, round, and reactive to light.  Cardiovascular:     Rate and Rhythm: Normal rate and regular rhythm.     Heart sounds: Normal heart sounds.  Pulmonary:     Effort: Pulmonary effort is normal.     Breath sounds: Normal breath sounds.  Abdominal:     Palpations: Abdomen is soft.     Tenderness: There is no abdominal tenderness.  Musculoskeletal:     Cervical back: No rigidity.     Right lower leg: Edema present.     Left lower leg: Edema present.     Comments: There is pitting edema to BLE  Skin:    General: Skin is warm and dry.  Neurological:     Mental Status: She is alert.     Comments: Patient wakes up to light touch. However whenever she opens her eyes she seems disoriented/confused.  Does not really follow commands.  When trying to do strength testing it seems like all 4 extremities are equal though it is difficult to get her to cooperate.     ED Results / Procedures / Treatments   Labs (all labs ordered are listed, but only abnormal results are displayed) Labs Reviewed  COMPREHENSIVE METABOLIC PANEL - Abnormal; Notable for the following components:      Result Value   CO2 20 (*)    Glucose, Bld 141 (*)    BUN 47 (*)    Creatinine, Ser 2.39 (*)    Calcium 8.6 (*)    Albumin 3.4 (*)    AST 147 (*)    ALT 46 (*)    Alkaline Phosphatase 191 (*)    Total Bilirubin 3.4 (*)    GFR, Estimated 20 (*)    All other components within normal limits  CBC WITH DIFFERENTIAL/PLATELET - Abnormal; Notable for the following components:   RBC 2.55 (*)    Hemoglobin 8.6 (*)    HCT 28.0 (*)    MCV 109.8 (*)    RDW 21.9 (*)    Platelets 99 (*)    Lymphs Abs 0.3 (*)    All other components within normal limits  URINALYSIS, W/ REFLEX TO CULTURE (INFECTION SUSPECTED) - Abnormal; Notable for the following components:   Color, Urine AMBER (*)    Bacteria, UA RARE (*)     All other components within normal limits  AMMONIA - Abnormal; Notable for the following components:   Ammonia 111 (*)    All other components within normal limits  PROTIME-INR - Abnormal; Notable for the following components:   Prothrombin Time 19.2 (*)    INR 1.6 (*)    All other components within normal limits  CBG  MONITORING, ED - Abnormal; Notable for the following components:   Glucose-Capillary 193 (*)    All other components within normal limits  RESP PANEL BY RT-PCR (RSV, FLU A&B, COVID)  RVPGX2  BRAIN NATRIURETIC PEPTIDE  MAGNESIUM  TROPONIN I (HIGH SENSITIVITY)  TROPONIN I (HIGH SENSITIVITY)    EKG EKG Interpretation  Date/Time:  Wednesday April 09 2022 11:12:21 EST Ventricular Rate:  96 PR Interval:  154 QRS Duration: 88 QT Interval:  406 QTC Calculation: 514 R Axis:   42 Text Interpretation: Sinus rhythm Low voltage, precordial leads Borderline T abnormalities, anterior leads Prolonged QT interval Confirmed by Sherwood Gambler [phone]) on 04/09/2022 1:05:02 PM  Radiology CT Head Wo Contrast  Result Date: 04/09/2022 CLINICAL DATA:  Altered mental status EXAM: CT HEAD WITHOUT CONTRAST TECHNIQUE: Contiguous axial images were obtained from the base of the skull through the vertex without intravenous contrast. RADIATION DOSE REDUCTION: This exam was performed according to the departmental dose-optimization program which includes automated exposure control, adjustment of the mA and/or kV according to patient size and/or use of iterative reconstruction technique. COMPARISON:  03/14/2022 FINDINGS: Brain: No evidence of acute infarction, hemorrhage, hydrocephalus, extra-axial collection or mass lesion/mass effect. Incidental note of cavum vergae variant of the lateral ventricles. Vascular: No hyperdense vessel or unexpected calcification. Skull: Normal. Negative for fracture or focal lesion. Sinuses/Orbits: No acute finding. Other: None. IMPRESSION: No acute intracranial  pathology. Electronically Signed   By: Delanna Ahmadi M.D.   On: 04/09/2022 13:52   DG Chest Portable 1 View  Result Date: 04/09/2022 CLINICAL DATA:  Provided history: Altered mental status. EXAM: PORTABLE CHEST 1 VIEW COMPARISON:  Prior chest radiographs 03/26/2022 and earlier. FINDINGS: The cardiomediastinal silhouette is unchanged. Ill-defined opacity within the medial right lung base, new from the prior examination. No appreciable airspace consolidation on the left. A trace left pleural effusion may be present. No evidence of pneumothorax. Redemonstration of multiple displaced bilateral rib fractures. Degenerative changes of the spine. Surgical clips project in the region of the right chest wall/axilla. IMPRESSION: 1. Ill-defined opacity within the medial right lung base, which may reflect atelectasis or pneumonia. 2. Possible trace left pleural effusion. 3. Redemonstration of multiple displaced bilateral rib fractures. Electronically Signed   By: Kellie Simmering D.O.   On: 04/09/2022 08:54    Procedures Procedures    Medications Ordered in ED Medications  lactulose (CHRONULAC) enema 200 gm (has no administration in time range)  haloperidol lactate (HALDOL) injection 2 mg (2 mg Intravenous Given 04/09/22 1118)  sodium chloride 0.9 % bolus 500 mL (0 mLs Intravenous Stopped 04/09/22 1307)    ED Course/ Medical Decision Making/ A&P                             Medical Decision Making Amount and/or Complexity of Data Reviewed Independent Historian:     Details: daughter Labs: ordered.    Details: Ammonia 111. UA unremarkable. Abnormal LFTs a little worse than baseline. Radiology: ordered and independent interpretation performed.    Details: No head bleed on CT No obvious lobar pneumonia on CXR ECG/medicine tests: ordered and independent interpretation performed.    Details: No acute ischemia  Risk Prescription drug management. Decision regarding hospitalization.   Patient presents with  altered mental status.  Overall, probably this is hepatic encephalopathy.  Will give lactulose.  Workup otherwise unrevealing except for a mild worsening of her creatinine.  Staff had been concerned about her abdominal distention to  the facility but she has a soft abdomen and no tenderness.  My suspicion that she has SBP is pretty low.  Otherwise, upon talking to daughter, she is concerned about the patient's overall medical health and wonders if this might be better to not pursue treatment/cure and consider palliative care.  I have consulted palliative care who will see in the hospital but is not sure if she is quite ready for hospice and so they will help managing her in the hospital.  Otherwise discussed with Dr. Marylyn Ishihara for admission.        Final Clinical Impression(s) / ED Diagnoses Final diagnoses:  Hepatic encephalopathy [facility])    Rx / DC Orders ED Discharge Orders     None         Sherwood Gambler, MD 04/09/22 1502

## 2022-04-09 NOTE — H&P (Signed)
History and Physical    Patient: Catherine Decker FGH:[id number] DOB: 12-Apr-1940 DOA: 04/09/2022 DOS: the patient was seen and examined on 04/09/2022 PCP: Antony Contras, MD  Patient coming from: SNF  Chief Complaint:  Chief Complaint  Patient presents with   Weakness   HPI: Catherine Decker is a 82 y.o. female with medical history significant of hypothyroidism, HTN, DM2, CKD 3b, HLD, metastatic breast carcinoma. Presenting with altered mental status. History is from daughter at bedside. She reports that the patient has been increasingly anxious over the past couple of days. The patient has been in rehab since her recent discharge from the hospital. At baseline she is able to converse and has ambulatory ability. Her daughter reports that she has been stressed since a recent family interaction. The daughter reports that she was notified by the facility that the patient was unresponsive this morning and was sent to the ED. She reports that the facility believes that the patient may have been confused last night, but they were not sure. There were no reports of fever, N/V or poor PO intake. When her symptoms did not improve this morning, they decided to send her to the ED for evaluation.   Review of Systems: unable to review all systems due to the inability of the patient to answer questions. Past Medical History:  Diagnosis Date   Anemia    Arthritis of back    Arthritis of hand    per patient right hand   Autoimmune deficiency syndrome ([place])    per patient   Breast cancer ([place])    Cancer ([place]) 08/2019   right breast ILC   Cirrhosis ([place])    Cirrhosis of liver ([facility])    Complication of anesthesia    per patient, slow to awake   Diabetes mellitus    GERD (gastroesophageal reflux disease)    Also, Hiatal Hernia    H/O migraine    Hepatitis    per patient, had infectious hepatitis as a 82 year old   Hyperlipidemia    Hypertension    Hypothyroidism    Splenic artery aneurysm ([place])     Past Surgical History:  Procedure Laterality Date   APPENDECTOMY     BREAST LUMPECTOMY     Right breast lumpectomy   CATARACT EXTRACTION W/ INTRAOCULAR LENS  IMPLANT, BILATERAL  2018   CHOLECYSTECTOMY  04/2010   MASTECTOMY W/ SENTINEL NODE BIOPSY Right 09/07/2019   Procedure: RIGHT SIMPLE MASTECTOMY WITH SENTINEL LYMPH NODE MAPPING;  Surgeon: Erroll Luna, MD;  Location: [facility];  Service: General;  Laterality: Right;  PEC BLOCK   SCAR REVISION Right 06/21/2020   Procedure: SCAR REVISION OF RIGHT MASTECTOMY;  Surgeon: Erroll Luna, MD;  Location: [facility];  Service: General;  Laterality: Right;   [place]   Social History:  reports that she has never smoked. She has never used smokeless tobacco. She reports that she does not drink alcohol and does not use drugs.  Allergies  Allergen Reactions   Demerol Anaphylaxis   Meperidine Anaphylaxis    Family History  Problem Relation Age of Onset   Diabetes Mother    Heart disease Mother    Hypertension Mother    Breast cancer Mother    Heart disease Father    Heart attack Father    Prostate cancer Father    Hypertension Sister    Varicose Veins Sister    Hyperlipidemia Sister    Colon  cancer Sister    Heart disease Brother    Diabetes Brother    Hypertension Brother    Prostate cancer Brother    Cirrhosis Brother    Hypertension Brother    Hypertension Daughter    Hypertension Son    Liver cancer Neg Hx    Osteoporosis Neg Hx     Prior to Admission medications   Medication Sig Start Date End Date Taking? Authorizing Provider  calcium carbonate (OS-CAL) 600 MG TABS tablet Take 600 mg by mouth daily with breakfast.   Yes [provider]  cholecalciferol (VITAMIN D3) 25 MCG (1000 UNIT) tablet Take 1,000 Units by mouth daily.   Yes [provider]  Coenzyme Q10 (COQ-10 PO) Take 1 capsule by mouth daily.   Yes [provider]  fluticasone (FLONASE) 50  MCG/ACT nasal spray Place 2 sprays into both nostrils daily. 04/03/22  Yes Pokhrel, Laxman, MD  folic acid (FOLVITE) 1 MG tablet Take 1 tablet (1 mg total) by mouth daily. 12/12/19  Yes Orson Slick, MD  ipratropium-albuterol (DUONEB) 0.5-2.5 (3) MG/3ML SOLN Take 3 mLs by nebulization every 6 (six) hours as needed (shotrness of breath or wheezing). Patient taking differently: Take 3 mLs by nebulization every 6 (six) hours as needed (for shortness of breath or wheezing). 04/03/22  Yes Pokhrel, Laxman, MD  letrozole (FEMARA) 2.5 MG tablet Take 1 tablet (2.5 mg total) by mouth every other day. 04/03/22  Yes Pokhrel, Laxman, MD  levothyroxine (SYNTHROID) 112 MCG tablet Take 112 mcg by mouth daily before breakfast.   Yes [provider]  loratadine (CLARITIN) 10 MG tablet Take 1 tablet (10 mg total) by mouth daily. 04/03/22  Yes Pokhrel, Laxman, MD  losartan (COZAAR) 25 MG tablet Take 0.5 tablets (12.5 mg total) by mouth daily. 04/07/22  Yes Pokhrel, Corrie Mckusick, MD  NON FORMULARY See admin instructions. "House supplement by mouth three times a day between meals"   Yes [provider]  Omega-3 Fatty Acids (FISH OIL OMEGA-3) 1000 MG CAPS Take 1,000 mg by mouth daily.   Yes [provider]  omeprazole (PRILOSEC) 20 MG capsule Take 20 mg by mouth daily as needed (acid reflux).    Yes [provider]  oxyCODONE (OXY IR/ROXICODONE) 5 MG immediate release tablet Take 0.5-1 tablets (2.5-5 mg total) by mouth every 6 (six) hours as needed for moderate pain or severe pain. Patient taking differently: Take 5 mg by mouth every 6 (six) hours as needed for moderate pain or severe pain. 04/03/22  Yes Pokhrel, Laxman, MD  Potassium Chloride ER 20 MEQ TBCR Take 20 mEq by mouth daily. 04/04/22 04/19/22 Yes [provider]  torsemide (DEMADEX) 20 MG tablet Take 40 mg by mouth daily.   Yes [provider]  vitamin B-12 (CYANOCOBALAMIN) 1000 MCG tablet Take 1,000 mcg by mouth daily.    Yes [provider]  feeding supplement, GLUCERNA SHAKE, (GLUCERNA SHAKE) LIQD Take 237 mLs by mouth 3 (three) times daily between meals. Patient not taking: Reported on 04/09/2022 04/03/22   Flora Lipps, MD  Glucose Blood (ONETOUCH ULTRA BLUE VI) daily.    [provider]  potassium chloride SA (KLOR-CON M) 20 MEQ tablet Take 1 tablet (20 mEq total) by mouth daily for 15 days. Patient not taking: Reported on 04/09/2022 04/03/22 04/18/22  Flora Lipps, MD    Physical Exam: Vitals:   04/09/22 1236 04/09/22 1330 04/09/22 1430 04/09/22 1545  BP: 112/61 136/71 133/73 (!) 121/108  Pulse: (!) 107 Marland Kitchen)  108 (!) 113 99  Resp: (!) 23 20 (!) 27 (!) 22  Temp:      TempSrc:      SpO2: 99% 98% 98% 99%  Weight:      Height:       General: 82 y.o. female resting in bed in NAD Neck: Supple, trachea midline Cardiovascular: RRR, +S1, S2, no m/g/r, equal pulses throughout Respiratory: CTABL, no w/r/r, normal WOB GI: BS+, NDNT, no masses noted, no organomegaly noted MSK: No c/c; B/l pedal edema Neuro: somnolent, not participating in exam  Data Reviewed:  Results for orders placed or performed during the hospital encounter of 04/09/22 (from the past 24 hour(s))  Comprehensive metabolic panel     Status: Abnormal   Collection Time: 04/09/22  8:36 AM  Result Value Ref Range   Sodium 140 135 - 145 mmol/L   Potassium 3.7 3.5 - 5.1 mmol/L   Chloride 106 98 - 111 mmol/L   CO2 20 (L) 22 - 32 mmol/L   Glucose, Bld 141 (H) 70 - 99 mg/dL   BUN 47 (H) 8 - 23 mg/dL   Creatinine, Ser 2.39 (H) 0.44 - 1.00 mg/dL   Calcium 8.6 (L) 8.9 - 10.3 mg/dL   Total Protein 6.7 6.5 - 8.1 g/dL   Albumin 3.4 (L) 3.5 - 5.0 g/dL   AST 147 (H) 15 - 41 U/L   ALT 46 (H) 0 - 44 U/L   Alkaline Phosphatase 191 (H) 38 - 126 U/L   Total Bilirubin 3.4 (H) 0.3 - 1.2 mg/dL   GFR, Estimated 20 (L) >60 mL/min   Anion gap 14 5 - 15  CBC with Differential     Status: Abnormal   Collection Time: 04/09/22  8:36 AM   Result Value Ref Range   WBC 4.0 4.0 - 10.5 K/uL   RBC 2.55 (L) 3.87 - 5.11 MIL/uL   Hemoglobin 8.6 (L) 12.0 - 15.0 g/dL   HCT 28.0 (L) 36.0 - 46.0 %   MCV 109.8 (H) 80.0 - 100.0 fL   MCH 33.7 26.0 - 34.0 pg   MCHC 30.7 30.0 - 36.0 g/dL   RDW 21.9 (H) 11.5 - 15.5 %   Platelets 99 (L) 150 - 400 K/uL   nRBC 0.0 0.0 - 0.2 %   Neutrophils Relative % 79 %   Neutro Abs 3.2 1.7 - 7.7 K/uL   Lymphocytes Relative 8 %   Lymphs Abs 0.3 (L) 0.7 - 4.0 K/uL   Monocytes Relative 9 %   Monocytes Absolute 0.4 0.1 - 1.0 K/uL   Eosinophils Relative 2 %   Eosinophils Absolute 0.1 0.0 - 0.5 K/uL   Basophils Relative 1 %   Basophils Absolute 0.0 0.0 - 0.1 K/uL   Immature Granulocytes 1 %   Abs Immature Granulocytes 0.04 0.00 - 0.07 K/uL   Polychromasia PRESENT   Troponin I (High Sensitivity)     Status: None   Collection Time: 04/09/22  8:36 AM  Result Value Ref Range   Troponin I (High Sensitivity) 16 <18 ng/L  Brain natriuretic peptide     Status: None   Collection Time: 04/09/22  8:36 AM  Result Value Ref Range   B Natriuretic Peptide 49.2 0.0 - 100.0 pg/mL  Protime-INR     Status: Abnormal   Collection Time: 04/09/22  8:36 AM  Result Value Ref Range   Prothrombin Time 19.2 (H) 11.4 - 15.2 seconds   INR 1.6 (H) 0.8 - 1.2  Magnesium  Status: None   Collection Time: 04/09/22  8:36 AM  Result Value Ref Range   Magnesium 2.1 1.7 - 2.4 mg/dL  Resp panel by RT-PCR (RSV, Flu A&B, Covid) Anterior Nasal Swab     Status: None   Collection Time: 04/09/22  8:37 AM   Specimen: Anterior Nasal Swab  Result Value Ref Range   SARS Coronavirus 2 by RT PCR NEGATIVE NEGATIVE   Influenza A by PCR NEGATIVE NEGATIVE   Influenza B by PCR NEGATIVE NEGATIVE   Resp Syncytial Virus by PCR NEGATIVE NEGATIVE  CBG monitoring, ED     Status: Abnormal   Collection Time: 04/09/22  9:17 AM  Result Value Ref Range   Glucose-Capillary 193 (H) 70 - 99 mg/dL  Urinalysis, w/ Reflex to Culture (Infection Suspected)  -Urine, Catheterized     Status: Abnormal   Collection Time: 04/09/22 10:07 AM  Result Value Ref Range   Specimen Source URINE, CATHETERIZED    Color, Urine AMBER (A) YELLOW   APPearance CLEAR CLEAR   Specific Gravity, Urine 1.019 1.005 - 1.030   pH 5.0 5.0 - 8.0   Glucose, UA NEGATIVE NEGATIVE mg/dL   Hgb urine dipstick NEGATIVE NEGATIVE   Bilirubin Urine NEGATIVE NEGATIVE   Ketones, ur NEGATIVE NEGATIVE mg/dL   Protein, ur NEGATIVE NEGATIVE mg/dL   Nitrite NEGATIVE NEGATIVE   Leukocytes,Ua NEGATIVE NEGATIVE   RBC / HPF 0-5 0 - 5 RBC/hpf   WBC, UA 0-5 0 - 5 WBC/hpf   Bacteria, UA RARE (A) NONE SEEN   Squamous Epithelial / HPF 0-5 0 - 5 /HPF   Mucus PRESENT    Hyaline Casts, UA PRESENT   Troponin I (High Sensitivity)     Status: None   Collection Time: 04/09/22 10:36 AM  Result Value Ref Range   Troponin I (High Sensitivity) 16 <18 ng/L  Ammonia     Status: Abnormal   Collection Time: 04/09/22 10:51 AM  Result Value Ref Range   Ammonia 111 (H) 9 - 35 umol/L   CXR: 1. Ill-defined opacity within the medial right lung base, which may reflect atelectasis or pneumonia. 2. Possible trace left pleural effusion. 3. Redemonstration of multiple displaced bilateral rib fractures.  CTH: No acute intracranial pathology.   Assessment and Plan: Hepatic encephalopathy     - admit to inpt, med-surg     - family has elected comfort care measures     - palliative care is onboard; appreciate assistance, orders placed     - working on discharge to hospice in [employer]  AKI on CKD3b Macrocytic anemia Thrombocytopenia Metastatic breast CA Hypothyroidism HTN HLD DM2     - family has elected comfort care measures; will discontinue any further treatments other than comfort measures  Advance Care Planning:   Code Status: DNR  Consults: Palliative Care  Family Communication: w/ dtr at bedside  Severity of Illness: The appropriate patient status for this patient is INPATIENT.  Inpatient status is judged to be reasonable and necessary in order to provide the required intensity of service to ensure the patient's safety. The patient's presenting symptoms, physical exam findings, and initial radiographic and laboratory data in the context of their chronic comorbidities is felt to place them at high risk for further clinical deterioration. Furthermore, it is not anticipated that the patient will be medically stable for discharge from the hospital within 2 midnights of admission.   * I certify that at the point of admission it is my clinical judgment that the  patient will require inpatient hospital care spanning beyond 2 midnights from the point of admission due to high intensity of service, high risk for further deterioration and high frequency of surveillance required.*  Author: Jonnie Finner, DO 04/09/2022 4:43 PM  For on call review [email or web address].

## 2022-04-09 NOTE — Progress Notes (Signed)
CSW has called [facility]osp[facility] for this patient, Hospice house at this time is full however they have took the patient's information. This CSW has reached out to the daughter to make her aware that hospice house will review. TOC will continue to follow any needs.

## 2022-04-09 NOTE — Progress Notes (Signed)
  Daily Progress Note   Patient Name: Catherine Decker       Date: 04/09/2022 DOB: November 07, 1940  Age: 82 y.o. MRN#: [id number] Attending Physician: Sherwood Gambler, MD Primary Care Physician: Antony Contras, MD Admit Date: 04/09/2022 Length of Stay: 0 days  Will place full consult note as soon as able. Discussed patient's care with daughter who is only NOK in EMR and only family at bedside. Transitioning to comfort focused care. Seeking inpatient hospice referral for [facility].    Chelsea Aus, DO Palliative Care Provider PMT # [phone]

## 2022-04-09 NOTE — ED Triage Notes (Signed)
Patient BIB [facility] from [facility] facility. Has dementia and CHF. Facility said patient has been weaker, not talking as much as she usually does. Has bone cancer and receiving treatment. Wound to her left foot.   EMS 150/90 95% room air HR 102 CBG 147

## 2022-04-09 NOTE — ED Notes (Signed)
Patient transported to CT 

## 2022-04-09 NOTE — ED Notes (Signed)
Called to [facility] by family for pt coughing. Episodic. Repositioned, VSS, SPO2 100%.

## 2022-04-09 NOTE — Consult Note (Signed)
Consultation Note Date: 04/09/2022   Patient Name: Catherine Decker  DOB: 02-01-1941  MRN: [id number]  Age / Sex: 82 y.o., female   PCP: Antony Contras, MD Referring Physician: Sherwood Gambler, MD  Reason for Consultation: Establishing goals of care     Chief Complaint/History of Present Illness:   Patient is an 82 year old female with a past medical history of anemia, cirrhosis of the liver, diabetes mellitus, GERD, hepatitis, hyperlipidemia, hypertension, hypothyroidism, and recent diagnosis of metastatic carcinoma who was admitted on 04/09/2022 for management of encephalopathy from rehab.  Patient had recently been discharged from the hospital (04/03/22) after management of a fall with multiple rib fractures, subdural hematomas, influenza a, volume overload in setting of acute on chronic diastolic CHF, and AKI and was discharged to rehab.  Upon admission to the ER CT imaging of head did not show any acute processes.  Lab work has shown and worsening renal failure and liver decompensation.  Palliative medicine team consulted to assist with complex medical decision making.  Extensive review of EMR prior to going to ER to see patient.  When presenting to bedside, no family present initially.  Patient seen laying in bed in mittens.  Patient appears very agitated.  Patient is confused and not able to communicate.  Did discuss care with bedside RN regarding medical care.  ------------------------------------------------------------------------------------------------------------- Advance Care Planning Conversation  Pertinent diagnosis: cirrhosis of the liver with decompensation, diabetes mellitus, GERD, hepatitis, hyperlipidemia, hypertension, hypothyroidism, recent diagnosis of metastatic carcinoma, acute renal failure   The patient and/or family consented to a voluntary [place]. Individuals present for the conversation: Patient unable to participate in complex medical  decision-making due to her current medical status.  Discussed care with patient's daughter, Catherine Decker, bedside and patient's son, Catherine Decker, over the phone, which is the majority of patient's children (2/3). Did attempt to contact patient's other son, Rodman Key, though he did not answer when called.   Summary of the conversation:  Then able to reach out to patient's daughter listed in contacts, [place].  She noted she was back downstairs with her mother so represented to the ER to patient's bedside to meet with her.  Introduced myself and the role of the palliative medicine team.  We were able to step out into a private area so that 2 of her friends could join as she wanted them there for support.  With permission, discussed patient's multiple medical comorbidities.  Discussed patient's worsening liver function which daughter knew could occur.  Daughter described patient's continued decompensation events since discharge.  Patient has not been eating or drinking.  Patient has become more confused.  Patient not actively able to participate well in rehab. Explored family support available for patient.  Patient currently lives with daughter stepfather, though patient and him are not married.  Patient has 3 children including Catherine Decker plus her 2 brothers.  She noted 2 brothers live out of state though she has been updating them on patient's medical care.  Noted would discussed care with daughter though based on review of ACP documentation on file, would likely need to reach out to her brother as well.  In the state of [place] if patient is not married the majority of adult children/parents can make medical decisions for the patient when patient is unable to.  Discussed what patient would want for her own medical care moving forward.  Daughter does not believe patient would want to go through all these invasive interventions.  Feels that radiation was  not helping the patient feel better, only worse.  Patient had  already stated she did not want chemotherapy.  Daughter voices that patient would want to be allowed to focus on comfort at the end of her life.  We discussed the idea of transitioning to comfort focused care at this time and referral to hospice for potential evaluation for inpatient versus returning home with hospice.  At this time we will need to provide patient with IV medications due to severe agitation for symptom management.  Daughter agreeing with this plan.  Noted would involve providers including hospice liaison for [facility] inpatient hospice as this is where daughter would prefer patient go.  Also visited with daughter and able to call her brother, Catherine Decker, on the phone.  Introduced myself and the role of the palliative medicine team.  We discussed all that I had talked to [place] about.  Also discussed medical care moving forward.  Catherine Decker agrees that patient would want to be allowed to focus on comfort and dignity at the end of her life.  He agrees with his sister and transitioning patient to comfort focused care only.  He agrees with seeking hospice referral.  All questions answered at that time.  Provided emotional support as able via active listening.  Outcome of the conversations and/or documents completed: Transition to comfort focused care at this time.  Will place referral for inpatient hospice evaluation in [facility].  I spent 47 minutes providing separately identifiable ACP services with the patient and/or surrogate decision maker in a voluntary, in-person conversation discussing the patient's wishes and goals as detailed in the above note.  Chelsea Aus, DO Palliative Care Provider  -------------------------------------------------------------------------------------------------------------  Updated multiple care team members including ER physician, bedside RN, TOC, hospice liaison, and hospitalist regarding conversation and patient's care to assist with coordination of care and  inpatient hospice referral.  Primary Diagnoses  Present on Admission: **None**   Palliative Review of Systems: Appears very agitated  Past Medical History:  Diagnosis Date   Anemia    Arthritis of back    Arthritis of hand    per patient right hand   Autoimmune deficiency syndrome ([place])    per patient   Breast cancer ([place])    Cancer ([place]) 08/2019   right breast ILC   Cirrhosis ([place])    Cirrhosis of liver ([place])    Complication of anesthesia    per patient, slow to awake   Diabetes mellitus    GERD (gastroesophageal reflux disease)    Also, Hiatal Hernia    H/O migraine    Hepatitis    per patient, had infectious hepatitis as a 82 year old   Hyperlipidemia    Hypertension    Hypothyroidism    Splenic artery aneurysm ([place])    Social History   Socioeconomic History   Marital status: Widowed    Spouse name: Not on file   Number of children: 3   Years of education: Not on file   Highest education level: Not on file  Occupational History   Occupation: retired   Occupation: retired    Comment: CNA  Tobacco Use   Smoking status: Never   Smokeless tobacco: Never  Vaping Use   Vaping Use: Never used  Substance and Sexual Activity   Alcohol use: No   Drug use: No   Sexual activity: Not on file  Other Topics Concern   Not on file  Social History Narrative   Lives with a friend.  Right handed    Lives in a two story home       Social Determinants of Health   Financial Resource Strain: Not on file  Food Insecurity: No Food Insecurity (03/14/2022)   Hunger Vital Sign    Worried About Running Out of Food in the Last Year: Never true    Ran Out of Food in the Last Year: Never true  Transportation Needs: No Transportation Needs (03/14/2022)   PRAPARE - Hydrologist (Medical): No    Lack of Transportation (Non-Medical): No  Physical Activity: Not on file  Stress: Not on file  Social Connections: Not on file   Family History   Problem Relation Age of Onset   Diabetes Mother    Heart disease Mother    Hypertension Mother    Breast cancer Mother    Heart disease Father    Heart attack Father    Prostate cancer Father    Hypertension Sister    Varicose Veins Sister    Hyperlipidemia Sister    Colon cancer Sister    Heart disease Brother    Diabetes Brother    Hypertension Brother    Prostate cancer Brother    Cirrhosis Brother    Hypertension Brother    Hypertension Daughter    Hypertension Son    Liver cancer Neg Hx    Osteoporosis Neg Hx    Scheduled Meds:  lactulose  300 mL Rectal Once   Continuous Infusions: PRN Meds:. Allergies  Allergen Reactions   Demerol Anaphylaxis   Meperidine Anaphylaxis   CBC:    Component Value Date/Time   WBC 4.0 04/09/2022 0836   HGB 8.6 (L) 04/09/2022 0836   HGB 10.1 (L) 01/24/2022 1116   HCT 28.0 (L) 04/09/2022 0836   PLT 99 (L) 04/09/2022 0836   PLT 73 (L) 01/24/2022 1116   MCV 109.8 (H) 04/09/2022 0836   NEUTROABS 3.2 04/09/2022 0836   LYMPHSABS 0.3 (L) 04/09/2022 0836   MONOABS 0.4 04/09/2022 0836   EOSABS 0.1 04/09/2022 0836   BASOSABS 0.0 04/09/2022 0836   Comprehensive Metabolic Panel:    Component Value Date/Time   NA 140 04/09/2022 0836   K 3.7 04/09/2022 0836   CL 106 04/09/2022 0836   CO2 20 (L) 04/09/2022 0836   BUN 47 (H) 04/09/2022 0836   CREATININE 2.39 (H) 04/09/2022 0836   CREATININE 1.66 (H) 01/24/2022 1116   CREATININE 0.85 06/01/2013 1648   GLUCOSE 141 (H) 04/09/2022 0836   CALCIUM 8.6 (L) 04/09/2022 0836   AST 147 (H) 04/09/2022 0836   AST 66 (H) 01/24/2022 1116   ALT 46 (H) 04/09/2022 0836   ALT 21 01/24/2022 1116   ALKPHOS 191 (H) 04/09/2022 0836   BILITOT 3.4 (H) 04/09/2022 0836   BILITOT 1.0 01/24/2022 1116   PROT 6.7 04/09/2022 0836   ALBUMIN 3.4 (L) 04/09/2022 0836    Physical Exam: Vital Signs: BP 133/73   Pulse (!) 113   Temp 97.9 F (36.6 C)   Resp (!) 27   Ht '5\' 3"'$  (1.6 m)   Wt 74 kg   SpO2 98%    BMI 28.90 kg/m  SpO2: SpO2: 98 % O2 Device: O2 Device: Room Air O2 Flow Rate:   Intake/output summary:  Intake/Output Summary (Last 24 hours) at 04/09/2022 1501 Last data filed at 04/09/2022 1307 Gross per 24 hour  Intake 500 ml  Output --  Net 500 ml   LBM:   Baseline Weight: Weight:  74 kg Most recent weight: Weight: 74 kg  General: awake, moaning, grimacing, in mittens Eyes: no discharge noted HENT: dry mucous membranes Cardiovascular:  tachycardia noted, edema in LE b/l Respiratory: increased work of breathing noted, Abdomen: distended Extremities: decreased strength though moving all spontaneously, not to command  Skin: Ecchymoses on upper extremities Neuro: Confused, mumbling, groaning, not following commands Psych: Agitated          Palliative Performance Scale: 10%               Additional Data Reviewed: Recent Labs    04/09/22 0836  WBC 4.0  HGB 8.6*  PLT 99*  NA 140  BUN 47*  CREATININE 2.39*    Imaging: CT Head Wo Contrast CLINICAL DATA:  Altered mental status  EXAM: CT HEAD WITHOUT CONTRAST  TECHNIQUE: Contiguous axial images were obtained from the base of the skull through the vertex without intravenous contrast.  RADIATION DOSE REDUCTION: This exam was performed according to the departmental dose-optimization program which includes automated exposure control, adjustment of the mA and/or kV according to patient size and/or use of iterative reconstruction technique.  COMPARISON:  03/14/2022  FINDINGS: Brain: No evidence of acute infarction, hemorrhage, hydrocephalus, extra-axial collection or mass lesion/mass effect. Incidental note of cavum vergae variant of the lateral ventricles.  Vascular: No hyperdense vessel or unexpected calcification.  Skull: Normal. Negative for fracture or focal lesion.  Sinuses/Orbits: No acute finding.  Other: None.  IMPRESSION: No acute intracranial pathology.  Electronically Signed   By: Delanna Ahmadi M.D.   On: 04/09/2022 13:52 DG Chest Portable 1 View CLINICAL DATA:  Provided history: Altered mental status.  EXAM: PORTABLE CHEST 1 VIEW  COMPARISON:  Prior chest radiographs 03/26/2022 and earlier.  FINDINGS: The cardiomediastinal silhouette is unchanged. Ill-defined opacity within the medial right lung base, new from the prior examination. No appreciable airspace consolidation on the left. A trace left pleural effusion may be present. No evidence of pneumothorax. Redemonstration of multiple displaced bilateral rib fractures. Degenerative changes of the spine. Surgical clips project in the region of the right chest wall/axilla.  IMPRESSION: 1. Ill-defined opacity within the medial right lung base, which may reflect atelectasis or pneumonia. 2. Possible trace left pleural effusion. 3. Redemonstration of multiple displaced bilateral rib fractures.  Electronically Signed   By: Kellie Simmering D.O.   On: 04/09/2022 08:54    I personally reviewed recent imaging.   Palliative Care Assessment and Plan Summary of Established Goals of Care and Medical Treatment Preferences   Patient is an 82 year old female with a past medical history of anemia, cirrhosis of the liver, diabetes mellitus, GERD, hepatitis, hyperlipidemia, hypertension, hypothyroidism, and recent diagnosis of metastatic carcinoma who was admitted on 04/09/2022 for management of encephalopathy from rehab.  Patient had recently been discharged from the hospital (04/03/22) after management of a fall with multiple rib fractures, subdural hematomas, influenza a, volume overload in setting of acute on chronic diastolic CHF, and AKI and was discharged to rehab.  Upon admission to the ER CT imaging of head did not show any acute processes.  Lab work has shown and worsening renal failure and liver decompensation.  Palliative medicine team consulted to assist with complex medical decision making.  # Complex medical decision  making/goals of care  -Patient unable to participate in medical decision making secondary to his mental status.  -Spoke with patient's NOK/daughter, [employer], at bedside as well as one of patient's sons, [place], via telephone as described in detail above  in HPI.  Children agreed that patient would want to have comfort focused care at this time and focused on symptom management and dignity at the end of her life.  -At this time we will discontinue interventions that are no longer focused on comfort such as IV fluids, imaging, or lab work.  Will instead focus on symptom management of pain, dyspnea, and agitation in the setting of end-of-life care.  - Code Status: DNR   - Prognosis: days to short weeks (no oral intake, bed bound status, worsening renal function, severe agitation, liver failure, known metastatic cancer)  # Symptom management    -Pain/Dyspnea, acute in the setting of end-of-life care                Patient was not on medications for pain previously.                               -Started IV Dilaudid 0.3 mg IV q 30 mins hour as needed.  Continue to adjust based on patient's symptom burden.  If patient needing frequent dosing, may need to consider continuous infusion.                  -Anxiety/agitation, in the setting of end-of-life care                               -Started IV Ativan 1 mg every 4 hours as needed. Continue to adjust based on patient's symptom burden.                                 -And also has IV Haldol 1 mg every 4 hours as needed. Continue to adjust based on patient's symptom burden.                   -Secretions, in the setting of end-of-life care                               -Started IV glycopyrrolate 0.2 mg every 4 hours as needed.  # Psycho-social/Spiritual Support:  - Support System: Daughter, two sons - Desire for further Chaplain support: Daughter noted patient and her are very faithful. Placed consult to chaplain for support.  # Discharge  Planning: Transitioning to comfort focused care at this time.  Placing referral for inpatient hospice evaluation in [facility].  Thank you for allowing the palliative care team to participate in the care Nicoletta Dress.  Chelsea Aus, DO Palliative Care Provider PMT # [phone]  If patient remains symptomatic despite maximum doses, please call PMT at [phone] between 0700 and 1900. Outside of these hours, please call attending, as PMT does not have night coverage.

## 2022-04-10 ENCOUNTER — Encounter: Payer: Self-pay | Admitting: Radiation Oncology

## 2022-04-10 ENCOUNTER — Ambulatory Visit: Payer: Medicare HMO

## 2022-04-10 DIAGNOSIS — K7682 Hepatic encephalopathy: Secondary | ICD-10-CM | POA: Diagnosis not present

## 2022-04-10 DIAGNOSIS — D696 Thrombocytopenia, unspecified: Secondary | ICD-10-CM

## 2022-04-10 DIAGNOSIS — E039 Hypothyroidism, unspecified: Secondary | ICD-10-CM

## 2022-04-10 DIAGNOSIS — N179 Acute kidney failure, unspecified: Secondary | ICD-10-CM | POA: Diagnosis not present

## 2022-04-10 DIAGNOSIS — E119 Type 2 diabetes mellitus without complications: Secondary | ICD-10-CM

## 2022-04-10 DIAGNOSIS — N1832 Chronic kidney disease, stage 3b: Secondary | ICD-10-CM

## 2022-04-10 DIAGNOSIS — G9341 Metabolic encephalopathy: Secondary | ICD-10-CM | POA: Diagnosis not present

## 2022-04-10 DIAGNOSIS — E785 Hyperlipidemia, unspecified: Secondary | ICD-10-CM

## 2022-04-10 DIAGNOSIS — I10 Essential (primary) hypertension: Secondary | ICD-10-CM

## 2022-04-10 DIAGNOSIS — Z170 Estrogen receptor positive status [ER+]: Secondary | ICD-10-CM

## 2022-04-10 DIAGNOSIS — C50411 Malignant neoplasm of upper-outer quadrant of right female breast: Secondary | ICD-10-CM

## 2022-04-10 MED ORDER — LORAZEPAM 2 MG/ML IJ SOLN
1.0000 mg | INTRAMUSCULAR | Status: DC
  Administered 2022-04-10 (×3): 1 mg via INTRAVENOUS
  Filled 2022-04-10 (×3): qty 1

## 2022-04-10 MED ORDER — LORAZEPAM 2 MG/ML IJ SOLN: 1.0000 mg | INTRAMUSCULAR | Status: DC | PRN

## 2022-04-10 NOTE — Discharge Summary (Signed)
Physician Discharge Summary  KENORA SPAYD DJS:[id number] DOB: 04-23-1940 DOA: 04/09/2022  PCP: Antony Contras, MD  Admit date: 04/09/2022 Discharge date: 04/10/2022  Time spent: 55 minutes  Recommendations for Outpatient Follow-up:  Patient will be discharged to residential hospice home of [facility].  Follow-up with MD at residential hospice home.   Discharge Diagnoses:  Principal Problem:   Acute metabolic encephalopathy Active Problems:   Hepatic encephalopathy (HCC)   Malignant neoplasm of upper-outer quadrant of right breast in female, estrogen receptor positive ([place])   DM2 (diabetes mellitus, type 2) ([place])   AKI (acute kidney injury) ([place])   Stage 3b chronic kidney disease (CKD) ([place])   HTN (hypertension)   HLD (hyperlipidemia)   Hypothyroidism   Macrocytic anemia   Thrombocytopenia (HCC)   Palliative care encounter   Goals of care, counseling/discussion   DNR (do not resuscitate)   High risk medication use   Pain   Agitation   Counseling and coordination of care   Dehydration   Need for emotional support   Metastatic carcinoma ([place])   Discharge Condition: Stable  Diet recommendation: Comfort feeds  Filed Weights   04/09/22 0822  Weight: 74 kg    History of present illness:  HPI per Dr. Azzie Glatter is a 82 y.o. female with medical history significant of hypothyroidism, HTN, DM2, CKD 3b, HLD, metastatic breast carcinoma. Presenting with altered mental status. History is from daughter at bedside. She reports that the patient has been increasingly anxious over the past couple of days. The patient has been in rehab since her recent discharge from the hospital. At baseline she is able to converse and has ambulatory ability. Her daughter reports that she has been stressed since a recent family interaction. The daughter reports that she was notified by the facility that the patient was unresponsive this morning and was sent to the ED. She reports that the  facility believes that the patient may have been confused last night, but they were not sure. There were no reports of fever, N/V or poor PO intake. When her symptoms did not improve this morning, they decided to send her to the ED for evaluation.    Hospital Course:  #1 acute hepatic encephalopathy -Patient noted to have presented with altered mental status, noted to have been at facility and noted to be confused at night prior to admission and noted to be unresponsive on the day of admission.  No fever, no nausea, no poor oral intake noted on admission. -CT head done without any acute abnormalities.  Urinalysis was nitrite negative, leukocytes negative. -Chest x-ray done with ill-defined opacity within the medial right lung base may reflect atelectasis or pneumonia.  Possible trace left pleural effusion.  Redemonstration of multiple displaced bilateral rib fractures noted. -Comprehensive metabolic profile done noted to have elevated BUN and creatinine up to 2.39.  Patient noted to also have a transaminitis.  Ammonia level was 111.  BNP noted at 49.2.  Troponins were negative x 2. -Family elected for comfort measures and palliative care consulted. -Patient seen in consultation by palliative care discussed with family and decision was comfort measures and patient will be transferred to a hospice facility.  Patient was placed on scheduled Ativan as well as IV Dilaudid and was comfortable during the hospitalization. -Patient will be discharged to residential hospice home.  2.  Acute kidney injury on CKD stage IIIb -Felt likely secondary to prerenal azotemia in the setting of ARB, diuretics. -Family decided on full  comfort measures. -Patient seen by palliative care and patient will be transition to residential hospice home.  3.  Microcytic anemia/thrombocytopenia/metastatic breast cancer -On admission family decided to transition to full comfort measures. -Patient will be transferred to residential  hospice home.  4.  Hypothyroidism/hypertension/hyperlipidemia/diabetes type 2 -Patient's home regimen was discontinued during the hospitalization as family decided to elect for full comfort measures. -Patient seen by palliative care and patient transition to full comfort measures. -Patient will be discharged to residential hospice home.  Procedures: CT head 04/09/2022 Chest x-ray 04/09/2022   Consultations: Palliative care: Dr. Vinetta Bergamo 04/09/2022  Discharge Exam: Vitals:   04/09/22 1545 04/09/22 2057  BP: (!) 121/108 (!) 109/41  Pulse: 99 77  Resp: (!) 22 17  Temp:  98 F (36.7 C)  SpO2: 99% 98%    General: NAD.  Minimally responsive. Cardiovascular: RRR no murmurs rubs or gallops.  No JVD.  No lower extremity edema. Respiratory: Clear to auscultation anterior lung fields.  Discharge Instructions   Discharge Instructions     Diet general   Complete by: As directed    Increase activity slowly   Complete by: As directed    No wound care   Complete by: As directed       Allergies as of 04/10/2022       Reactions   Demerol Anaphylaxis   Meperidine Anaphylaxis        Medication List     STOP taking these medications    calcium carbonate 600 MG Tabs tablet Commonly known as: OS-CAL   cholecalciferol 25 MCG (1000 UNIT) tablet Commonly known as: VITAMIN D3   COQ-10 PO   cyanocobalamin 1000 MCG tablet Commonly known as: VITAMIN B12   feeding supplement (GLUCERNA SHAKE) Liqd   Fish Oil Omega-3 1000 MG Caps   fluticasone 50 MCG/ACT nasal spray Commonly known as: FLONASE   folic acid 1 MG tablet Commonly known as: FOLVITE   ipratropium-albuterol 0.5-2.5 (3) MG/3ML Soln Commonly known as: DUONEB   letrozole 2.5 MG tablet Commonly known as: FEMARA   levothyroxine 112 MCG tablet Commonly known as: SYNTHROID   loratadine 10 MG tablet Commonly known as: CLARITIN   losartan 25 MG tablet Commonly known as: COZAAR   NON FORMULARY   omeprazole 20 MG  capsule Commonly known as: PRILOSEC   ONETOUCH ULTRA BLUE VI   oxyCODONE 5 MG immediate release tablet Commonly known as: Oxy IR/ROXICODONE   Potassium Chloride ER 20 MEQ Tbcr   potassium chloride SA 20 MEQ tablet Commonly known as: KLOR-CON M   torsemide 20 MG tablet Commonly known as: DEMADEX       Allergies  Allergen Reactions   Demerol Anaphylaxis   Meperidine Anaphylaxis    Follow-up Information     MD AT hOSPICE FACILITY Follow up.                   The results of significant diagnostics from this hospitalization (including imaging, microbiology, ancillary and laboratory) are listed below for reference.    Significant Diagnostic Studies: CT Head Wo Contrast  Result Date: 04/09/2022 CLINICAL DATA:  Altered mental status EXAM: CT HEAD WITHOUT CONTRAST TECHNIQUE: Contiguous axial images were obtained from the base of the skull through the vertex without intravenous contrast. RADIATION DOSE REDUCTION: This exam was performed according to the departmental dose-optimization program which includes automated exposure control, adjustment of the mA and/or kV according to patient size and/or use of iterative reconstruction technique. COMPARISON:  03/14/2022 FINDINGS: Brain: No evidence  of acute infarction, hemorrhage, hydrocephalus, extra-axial collection or mass lesion/mass effect. Incidental note of cavum vergae variant of the lateral ventricles. Vascular: No hyperdense vessel or unexpected calcification. Skull: Normal. Negative for fracture or focal lesion. Sinuses/Orbits: No acute finding. Other: None. IMPRESSION: No acute intracranial pathology. Electronically Signed   By: Delanna Ahmadi M.D.   On: 04/09/2022 13:52   DG Chest Portable 1 View  Result Date: 04/09/2022 CLINICAL DATA:  Provided history: Altered mental status. EXAM: PORTABLE CHEST 1 VIEW COMPARISON:  Prior chest radiographs 03/26/2022 and earlier. FINDINGS: The cardiomediastinal silhouette is unchanged.  Ill-defined opacity within the medial right lung base, new from the prior examination. No appreciable airspace consolidation on the left. A trace left pleural effusion may be present. No evidence of pneumothorax. Redemonstration of multiple displaced bilateral rib fractures. Degenerative changes of the spine. Surgical clips project in the region of the right chest wall/axilla. IMPRESSION: 1. Ill-defined opacity within the medial right lung base, which may reflect atelectasis or pneumonia. 2. Possible trace left pleural effusion. 3. Redemonstration of multiple displaced bilateral rib fractures. Electronically Signed   By: Kellie Simmering D.O.   On: 04/09/2022 08:54   MR TOTAL SPINE METS SCREENING  Result Date: 03/31/2022 CLINICAL DATA:  Provided history: Back pain. EXAM: MRI TOTAL SPINE WITHOUT AND WITH CONTRAST TECHNIQUE: Multisequence MR imaging of the spine from the cervical spine to the sacrum was performed prior to and following IV contrast administration for evaluation of spinal metastatic disease. CONTRAST:  7.5m GADAVIST GADOBUTROL 1 MMOL/ML IV SOLN COMPARISON:  CT chest/abdomen/pelvis 03/14/2022. CT chest/abdomen/pelvis 02/28/2020. FINDINGS: MRI CERVICAL SPINE FINDINGS Alignment: Straightening of the expected cervical lordosis. No significant spondylolisthesis. Vertebrae: Multifocal marrow signal abnormality within the cervical spine compatible with widespread osseous metastatic disease (this is best appreciated on the sagittal T1-weighted sequence). Corresponding pathologic enhancement at multiple sites. No pathologic fracture is identified. No evidence of epidural tumorwithin the spinal canal. Cord: No appreciable signal abnormality or pathologic enhancement within the cervical spinal cord. Minimal spinal cord flattening at C4-C5, as described below. Posterior Fossa, vertebral arteries, paraspinal tissues: No acute finding within included portions of the posterior fossa. Vertebral arteries poorly  assessed in the absence of axial T2 weighted imaging. No paraspinal mass or collection. Disc levels: Cervical spondylosis with multilevel disc degeneration, disc bulges/central disc protrusions, posterior disc osteophytes, uncovertebral hypertrophy and facet arthrosis. At C4-C5, a posterior disc osteophyte complex contributes to moderate spinal canal stenosis. The disc osteophyte complex contacts and minimally flattens the ventral aspect of the spinal cord. No more than mild spinal canal narrowing at the remaining cervical levels. Multilevel foraminal stenosis, incompletely assessed on sagittal imaging. MRI THORACIC SPINE FINDINGS Alignment: No significant spondylolisthesis. Minimal bony retropulsion at the level of the T7 inferior endplate Vertebrae: Multifocal marrow signal abnormality within the thoracic spine compatible with widespread osseous metastatic disease (this is best appreciated on the sagittal T1 weighted sequences). Corresponding pathologic enhancement at multiple sites. T4 superior endplate compression fracture, with mild height loss and without bony retropulsion. Although chronic, this may reflect a pathologic fracture. T7 vertebral compression fracture (30-40% height loss). Minimal bony retropulsion at the level of the T7 inferior endplate. This likely reflects a pathologic compression fracture. There is mild edema within the T7 vertebral body, and this fracture may be subacute. Please refer to the recent prior CT chest/abdomen/pelvis for description of bilateral rib fractures. No enhancing epidural tumor is identified within the thoracic spinal canal. Cord: No signal abnormality or pathologic enhancement is appreciated within the  spinal cord at the thoracic levels. There is mild prominence of the central canal of the mid to lower thoracic levels. Paraspinal and other soft tissues: Please refer to the recent prior CT chest/abdomen/pelvis for a description of thoracic and abdominopelvic soft tissue  findings. No paraspinal mass or collection. Disc levels: Thoracic spondylosis with multilevel disc degeneration, disc bulges and facet arthrosis. At T9-T10, a small central disc protrusion mildly effaces the ventral thecal sac and contacts the ventral aspect of the spinal cord. No significant spinal canal stenosis at the remaining thoracic levels. No compressive foraminal stenosis is identified. MRI LUMBAR SPINE FINDINGS Segmentation: 5 lumbar vertebrae. The caudal most well-formed intervertebral disc space is designated L5-S1. Alignment:  Significant spondylolisthesis. Vertebrae: Multifocal signal abnormality within the lumbar spine and visualized sacrum compatible with widespread osseous metastatic disease. Corresponding pathologic enhancement at multiple sites. L3 compression fracture (25% height loss). This fracture may be pathologic. There is mild edema and enhancement along the L3 superior endplate at this site, this fracture may be subacute. No epidural tumor is identified within the lumbar spinal canal. Conus medullaris: Extends to the L1 level and appears normal. Paraspinal and other soft tissues: Please refer to the recent prior CT chest/abdomen/pelvis for description of abdominopelvic soft tissue findings. No paraspinal mass or collection. Disc levels: Lumbar spondylosis with multilevel disc degeneration, disc bulges, facet arthrosis and ligamentum flavum hypertrophy. Findings are most notably as follows. Disc degeneration is greatest to the left at L3-L4 (advanced at this site). L2-L3: Disc bulge. Facet arthrosis ligamentum flavum hypertrophy. Moderate spinal canal stenosis. Mild left neural foraminal narrowing. L3-L4: Disc bulge. Facet arthrosis and ligamentum flavum hypertrophy. Severe bilateral subarticular and central canal stenosis. Bilateral neural foraminal narrowing (mild right, mild/moderate left). L4-L5: Disc bulge. Facet arthrosis and ligamentum flavum hypertrophy. Right subarticular narrowing  with medialization of the descending right L5 nerve root. Left subarticular narrowing with crowding of the descending left L5 nerve root. Mild right subarticular narrowing. Mild to moderate central canal stenosis. Moderate right neural foraminal narrowing. L5-S1: Disc bulge. Superimposed central disc protrusion. Facet arthrosis. The disc protrusion contributes to mild bilateral subarticular narrowing (without frank nerve root impingement). No significant central canal or foraminal stenosis. IMPRESSION: Cervical spine: 1. Findings compatible with widespread osseous metastatic disease within the cervical spine. 2. No pathologic compression fracture. 3. No epidural tumor identified within the cervical spinal canal. 4. Cervical spondylosis, as described. Thoracic spine: 1. Findings compatible with widespread osseous metastatic disease within the thoracic spine. 2. T7 vertebral compression fracture (30-40% height loss). Minimal bony retropulsion at the level of the T7 inferior endplate, not resulting in significant spinal canal stenosis. This likely reflects a pathologic compression fracture. There is mild edema within the T7 vertebral body, and this fracture may be subacute. 3. Mild T4 superior endplate vertebral compression fracture. Although chronic, this fracture may be pathologic. 4. No epidural tumor identified within the thoracic spinal canal. 5. Thoracic spondylosis, as described. 6. Please refer to the recent prior CT chest/abdomen/pelvis for description of bilateral rib fractures. Lumbar spine: 1. Findings compatible with widespread osseous metastatic disease within the lumbar spine and sacrum. 2. L3 superior endplate vertebral compression fracture, with 25% height loss and without bony retropulsion. This fracture may be pathologic. Additionally, there is mild edema and enhancement along the L3 superior endplate at this site, this fracture may be subacute. 3. No epidural tumor identified within the lumbar  spinal canal. 4. Lumbar spondylosis, as described. Notably, there is multifactorial severe bilateral subarticular and central canal  stenosis at L3-L4. Electronically Signed   By: Kellie Simmering D.O.   On: 03/31/2022 13:43   VAS [place] LOWER EXTREMITY VENOUS (DVT)  Result Date: 03/26/2022  Lower Venous DVT Study Patient Name:  ALLISYN KUNZ  Date of Exam:   03/26/2022 Medical Rec #: [id number]        Accession #:    [id number] Date of Birth: 10-07-1940       Patient Gender: F Patient Age:   82 years Exam Location:  [facility] Procedure:      VAS [place] LOWER EXTREMITY VENOUS (DVT) Referring Phys: Annamaria Boots XU --------------------------------------------------------------------------------  Indications: Edema.  Limitations: Body habitus and poor ultrasound/tissue interface. Comparison Study: No previous exams Performing Technologist: Jody Hill RVT, RDMS  Examination Guidelines: A complete evaluation includes B-mode imaging, spectral Doppler, color Doppler, and power Doppler as needed of all accessible portions of each vessel. Bilateral testing is considered an integral part of a complete examination. Limited examinations for reoccurring indications may be performed as noted. The reflux portion of the exam is performed with the patient in reverse Trendelenburg.  +---------+---------------+---------+-----------+----------+-------------------+ RIGHT    CompressibilityPhasicitySpontaneityPropertiesThrombus Aging      +---------+---------------+---------+-----------+----------+-------------------+ CFV      Full           Yes      Yes                                      +---------+---------------+---------+-----------+----------+-------------------+ SFJ      Full                                                             +---------+---------------+---------+-----------+----------+-------------------+ FV Prox  Full           Yes      Yes                                       +---------+---------------+---------+-----------+----------+-------------------+ FV Mid   Full           Yes      Yes                                      +---------+---------------+---------+-----------+----------+-------------------+ FV DistalFull           Yes      Yes                                      +---------+---------------+---------+-----------+----------+-------------------+ PFV      Full                                                             +---------+---------------+---------+-----------+----------+-------------------+ POP      Full           Yes  Yes                                      +---------+---------------+---------+-----------+----------+-------------------+ PTV                                                   Not well visualized +---------+---------------+---------+-----------+----------+-------------------+ PERO                                                  Not well visualized +---------+---------------+---------+-----------+----------+-------------------+   +---------+---------------+---------+-----------+----------+--------------+ LEFT     CompressibilityPhasicitySpontaneityPropertiesThrombus Aging +---------+---------------+---------+-----------+----------+--------------+ CFV      Full           Yes      Yes                                 +---------+---------------+---------+-----------+----------+--------------+ SFJ      Full                                                        +---------+---------------+---------+-----------+----------+--------------+ FV Prox  Full           Yes      Yes                                 +---------+---------------+---------+-----------+----------+--------------+ FV Mid   Full           Yes      Yes                                 +---------+---------------+---------+-----------+----------+--------------+ FV DistalFull           Yes      Yes                                  +---------+---------------+---------+-----------+----------+--------------+ PFV      Full                                                        +---------+---------------+---------+-----------+----------+--------------+ POP      Full           Yes      Yes                                 +---------+---------------+---------+-----------+----------+--------------+ PTV      Full                                                        +---------+---------------+---------+-----------+----------+--------------+  PERO     Full                                                        +---------+---------------+---------+-----------+----------+--------------+     Summary: BILATERAL: - No evidence of deep vein thrombosis seen in the lower extremities, bilaterally. -No evidence of popliteal cyst, bilaterally. -Diffuse subcutaneous edema seen bilaterally.  *See table(s) above for measurements and observations. Electronically signed by Harold Barban MD on 03/26/2022 at 8:33:56 PM.    Final    DG CHEST PORT 1 VIEW  Result Date: 03/26/2022 CLINICAL DATA:  Shortness of breath EXAM: PORTABLE CHEST 1 VIEW COMPARISON:  Chest x-ray March 24, 2022 FINDINGS: The cardiomediastinal silhouette is unchanged in contour. Low lung volumes with bronchovascular crowding. The left costophrenic angle is excluded from the field of view. No focal pulmonary opacity. No pleural effusion or pneumothorax. The visualized upper abdomen is unremarkable. Redemonstrated multiple displaced bilateral rib fractures. IMPRESSION: No acute cardiopulmonary abnormality. Electronically Signed   By: Beryle Flock M.D.   On: 03/26/2022 08:18   CT BIOPSY  Result Date: 03/25/2022 INDICATION: 82 year old female with a history iliac bone lesion referred for biopsy EXAM: CT BIOPSY MEDICATIONS: None. ANESTHESIA/SEDATION: Moderate (conscious) sedation was employed during this procedure. A total of Versed 1.0 mg and Fentanyl  50 mcg was administered intravenously. Moderate Sedation Time: 11 minutes. The patient's level of consciousness and vital signs were monitored continuously by radiology nursing throughout the procedure under my direct supervision. FLUOROSCOPY TIME:  CT COMPLICATIONS: None PROCEDURE: Informed written consent was obtained from the patient after a thorough discussion of the procedural risks, benefits and alternatives. All questions were addressed. Maximal Sterile Barrier Technique was utilized including caps, mask, sterile gowns, sterile gloves, sterile drape, hand hygiene and skin antiseptic. A timeout was performed prior to the initiation of the procedure. Patient positioned supine on the CT gantry table. Scout CT acquired for planning purposes. The patient is prepped and draped in the usual sterile fashion. 1% lidocaine was used for local anesthesia. Using CT guidance, guide needle was advanced into the lytic lesion of the left iliac crest. Once we confirmed needle tip position, multiple 18 gauge core biopsy were acquired. Specimen placed into formalin. Patient tolerated the procedure well and remained hemodynamically stable throughout. No complications were encountered and no significant blood loss. IMPRESSION: Status post CT-guided biopsy of left iliac crest lytic lesion. Signed, Dulcy Fanny. Nadene Rubins, RPVI [facility] Electronically Signed   By: Corrie Mckusick D.O.   On: 03/25/2022 16:21   DG CHEST PORT 1 VIEW  Result Date: 03/24/2022 CLINICAL DATA:  Shortness of breath. EXAM: PORTABLE CHEST 1 VIEW COMPARISON:  03/22/2022 FINDINGS: Lungs are adequately inflated without focal airspace consolidation or effusion. Cardiomediastinal silhouette is normal. Surgical clips over the right axilla. Talus device overlies the right upper quadrant. There are multiple displaced left lateral rib fractures as well as displaced right lateral rib fractures unchanged.  IMPRESSION: 1. No acute cardiopulmonary disease. 2. Multiple displaced bilateral rib fractures unchanged. Electronically Signed   By: Marin Olp M.D.   On: 03/24/2022 08:19   DG CHEST PORT 1 VIEW  Result Date: 03/22/2022 CLINICAL DATA:  Shortness of breath, cough. EXAM: PORTABLE CHEST 1 VIEW COMPARISON:  Chest x-rays dated 03/21/2022 and 03/20/2022 chest CT dated  03/14/2022. FINDINGS: Persistent airspace opacities at the bilateral lung bases, atelectasis and/or small pleural effusions, but improved aeration at the lung bases compared to the recent chest x-rays. No new lung findings. No pneumothorax is seen. Heart size and mediastinal contours appear stable. IMPRESSION: Improved aeration at the bilateral lung bases. Persistent airspace opacities at the lung bases, likely decreased atelectasis and/or small pleural effusions. Electronically Signed   By: Franki Cabot M.D.   On: 03/22/2022 09:40   ECHOCARDIOGRAM COMPLETE  Result Date: 03/22/2022    ECHOCARDIOGRAM REPORT   Patient Name:   HADASSAH RANA Date of Exam: 03/22/2022 Medical Rec #:  [id number]       Height:       63.0 in Accession #:    [id number]      Weight:       171.3 lb Date of Birth:  September 19, 1940      BSA:          1.810 m Patient Age:    72 years        BP:           135/58 mmHg Patient Gender: F               HR:           66 bpm. Exam Location:  Inpatient Procedure: 2D Echo, Cardiac Doppler and Color Doppler Indications:    Bacteremia  History:        Patient has no prior history of Echocardiogram examinations.                 Risk Factors:Diabetes, Hypertension and Dyslipidemia. FLU                 positive. Breast cancer 2021. SDH.  Sonographer:    Roseanna Rainbow RDCS Referring Phys: [id number] [facility] LATIF [facility]  Sonographer Comments: Suboptimal parasternal window and suboptimal subcostal window. Could not turn due to multiple rib fractures. IMPRESSIONS  1. Left ventricular ejection fraction, by estimation, is 60 to 65%. The left ventricle has  normal function. The left ventricle has no regional wall motion abnormalities. There is mild left ventricular hypertrophy. Left ventricular diastolic parameters are consistent with Grade I diastolic dysfunction (impaired relaxation). Elevated left ventricular end-diastolic pressure.  2. Right ventricular systolic function is normal. The right ventricular size is normal.  3. The mitral valve is abnormal. No evidence of mitral valve regurgitation. No evidence of mitral stenosis.  4. The aortic valve is tricuspid. Aortic valve regurgitation is not visualized. No aortic stenosis is present.  5. The inferior vena cava is normal in size with greater than 50% respiratory variability, suggesting right atrial pressure of 3 mmHg. FINDINGS  Left Ventricle: Left ventricular ejection fraction, by estimation, is 60 to 65%. The left ventricle has normal function. The left ventricle has no regional wall motion abnormalities. The left ventricular internal cavity size was normal in size. There is  mild left ventricular hypertrophy. Left ventricular diastolic parameters are consistent with Grade I diastolic dysfunction (impaired relaxation). Elevated left ventricular end-diastolic pressure. Right Ventricle: The right ventricular size is normal. No increase in right ventricular wall thickness. Right ventricular systolic function is normal. Left Atrium: Left atrial size was normal in size. Right Atrium: Right atrial size was normal in size. Pericardium: There is no evidence of pericardial effusion. Mitral Valve: The mitral valve is abnormal. There is mild thickening of the mitral valve leaflet(s). Mild mitral annular calcification. No evidence of mitral valve regurgitation. No evidence of mitral  valve stenosis. Tricuspid Valve: The tricuspid valve is normal in structure. Tricuspid valve regurgitation is mild . No evidence of tricuspid stenosis. Aortic Valve: The aortic valve is tricuspid. Aortic valve regurgitation is not visualized. No  aortic stenosis is present. Pulmonic Valve: The pulmonic valve was normal in structure. Pulmonic valve regurgitation is mild. No evidence of pulmonic stenosis. Aorta: The aortic root is normal in size and structure. Venous: The inferior vena cava is normal in size with greater than 50% respiratory variability, suggesting right atrial pressure of 3 mmHg. IAS/Shunts: No atrial level shunt detected by color flow Doppler.  LEFT VENTRICLE PLAX 2D LVIDd:         4.30 cm   Diastology LVIDs:         2.25 cm   LV e' medial:    6.31 cm/s LV PW:         1.10 cm   LV E/e' medial:  14.5 LV IVS:        1.30 cm   LV e' lateral:   6.31 cm/s LVOT diam:     2.30 cm   LV E/e' lateral: 14.5 LV SV:         125 LV SV Index:   69 LVOT Area:     4.15 cm  RIGHT VENTRICLE             IVC RV S prime:     12.20 cm/s  IVC diam: 1.50 cm TAPSE (M-mode): 2.2 cm LEFT ATRIUM             Index        RIGHT ATRIUM           Index LA diam:        3.40 cm 1.88 cm/m   RA Area:     11.10 cm LA Vol (A2C):   29.9 ml 16.52 ml/m  RA Volume:   19.60 ml  10.83 ml/m LA Vol (A4C):   24.2 ml 13.37 ml/m LA Biplane Vol: 26.2 ml 14.47 ml/m  AORTIC VALVE LVOT Vmax:   145.00 cm/s LVOT Vmean:  104.000 cm/s LVOT VTI:    0.302 m  AORTA Ao Root diam: 3.70 cm Ao Asc diam:  3.30 cm MITRAL VALVE MV Area (PHT): 3.42 cm    SHUNTS MV Decel Time: 222 msec    Systemic VTI:  0.30 m MV E velocity: 91.80 cm/s  Systemic Diam: 2.30 cm MV A velocity: 95.50 cm/s MV E/A ratio:  0.96 Jenkins Rouge MD Electronically signed by Jenkins Rouge MD Signature Date/Time: 03/22/2022/9:30:38 AM    Final    DG CHEST PORT 1 VIEW  Result Date: 03/21/2022 CLINICAL DATA:  Shortness of breath EXAM: PORTABLE CHEST 1 VIEW COMPARISON:  Chest radiograph dated 03/20/2022 FINDINGS: Low lung volumes with similar bibasilar and right lower linear opacities. Similar small bilateral pleural effusions. No pneumothorax. Similar cardiomediastinal silhouette. The visualized skeletal structures are unremarkable.  IMPRESSION: 1. Similar small bilateral pleural effusions. 2. Low lung volumes with similar bibasilar and right lower linear opacities, likely atelectasis. Electronically Signed   By: Darrin Nipper M.D.   On: 03/21/2022 08:36   DG CHEST PORT 1 VIEW  Result Date: 03/20/2022 CLINICAL DATA:  [id number] with shortness of breath. EXAM: PORTABLE CHEST 1 VIEW COMPARISON:  Portable chest and chest CT without contrast both 03/14/2022 FINDINGS: 4:57 a.m. The heart has mildly enlarged in the interval. There is now mild perihilar vascular congestion and lower zonal interstitial edema consistent with CHF or fluid overload. Small  pleural effusions have also increased in size, with increasing overlying linear and hazy opacities consistent with atelectasis or pneumonia versus ground-glass edema. The mid and upper lung fields are clear of focal opacities. There are right axillary surgical clips. The mediastinum is normally outlined. There is calcification of the transverse aorta. Degenerative change thoracic spine. IMPRESSION: 1. Mild cardiomegaly with perihilar vascular congestion and lower zonal interstitial edema consistent with CHF or fluid overload. 2. Small pleural effusions have increased in size, with increasing overlying linear and hazy opacities consistent with atelectasis or pneumonia versus ground-glass edema. 3. Clinical correlation and radiographic follow-up recommended. Electronically Signed   By: Telford Nab M.D.   On: 03/20/2022 07:47   US Abdomen Limited RUQ (LIVER/GB)  Result Date: 03/15/2022 CLINICAL DATA:  Elevated liver function tests EXAM: ULTRASOUND ABDOMEN LIMITED RIGHT UPPER QUADRANT COMPARISON:  Abdominal CT from yesterday FINDINGS: Gallbladder: History of cholecystectomy Common bile duct: Diameter: 5 mm Liver: Heterogeneous parenchyma with lobulation from known cirrhosis. There is history of breast cancer but constellation of findings suggest long term cirrhosis rather than pseudocirrhosis. The  low-density area highlighted on prior study is an aneurysmal vessel there is contiguous with the portal system on clips, up to 3.6 cm. No gross solid mass lesion. No portal venous occlusion or reversal noted. IMPRESSION: Cirrhosis with portal hypertension. The low-density structure in the left lobe liver highlighted on prior noncontrast CT is a portal varix. Electronically Signed   By: Jorje Guild M.D.   On: 03/15/2022 08:00   US RENAL  Result Date: 03/14/2022 CLINICAL DATA:  AKI EXAM: RENAL / URINARY TRACT ULTRASOUND COMPLETE COMPARISON:  CT chest abdomen and pelvis March 14, 2022 FINDINGS: Right Kidney: Renal measurements: 9.3 x 4.8 x 4.3 cm = volume: 98.8 mL. Renal cortical thinning. Echogenicity within normal limits. No mass or hydronephrosis visualized. Left Kidney: Renal measurements: 9.1 x 4.0 x 3.8 cm = volume: 73.1 mL. Renal cortical thinning. Echogenicity within normal limits. No mass or hydronephrosis visualized. Bladder: Appears normal for degree of bladder distention. Other: None. IMPRESSION: 1. Bilateral renal cortical thinning, which can be seen in the setting of medical renal disease. 2. No hydronephrosis. Electronically Signed   By: Beryle Flock M.D.   On: 03/14/2022 18:35   CT CHEST ABDOMEN PELVIS WO CONTRAST  Result Date: 03/14/2022 CLINICAL DATA:  Fall yesterday. Rib pain. Prior mastectomy for right breast cancer. Known cirrhosis. * Tracking Code: BO * EXAM: CT CHEST, ABDOMEN AND PELVIS WITHOUT CONTRAST TECHNIQUE: Multidetector CT imaging of the chest, abdomen and pelvis was performed following the standard protocol without IV contrast. RADIATION DOSE REDUCTION: This exam was performed according to the departmental dose-optimization program which includes automated exposure control, adjustment of the mA and/or kV according to patient size and/or use of iterative reconstruction technique. COMPARISON:  02/28/2020 FINDINGS: CT CHEST FINDINGS Cardiovascular: Mild degradation secondary  to arm position, not raised above the head. Lack of IV contrast. Aortic atherosclerosis. Tortuous thoracic aorta. Normal heart size, without pericardial effusion. Lad coronary artery calcification. Mediastinum/Nodes: Right axillary node dissection. No axillary or subpectoral adenopathy. Calcified mediastinal and right hilar nodes are likely related to old granulomatous disease. No mediastinal or hilar adenopathy, given limitations of unenhanced CT. Lungs/Pleura: No pleural fluid.  Mild left base scarring. Inferior right middle lobe and dependent right lower lobe areas of consolidation. Peribronchovascular nodularity within the more central right middle and right lower lobes. Example 9 mm in the right lower lobe on 81/5. These areas are all new since 02/28/2020. No  pneumothorax. Musculoskeletal: Right mastectomy. Osteopenia. Minimally displaced fourth and fifth lateral left rib fractures including on 17 and 21 of series 3 are acute. The seventh lateral right rib fracture is new in the interval but partially healed. An eighth posterolateral right rib fracture is displaced, of indeterminate acuity but unhealed on 39/3. Mild superior endplate compression deformity at T4 is new in the interval. A moderate T7 compression deformity is without ventral canal encroachment and demonstrates heterogeneous increased density within. Heterogeneous increased density within the right acromion on 05/03. CT ABDOMEN PELVIS FINDINGS Hepatobiliary: Advanced cirrhosis. Left hepatic lobe hypoattenuation centrally could represent an enlarged left portal vein when correlated with prior contrast enhanced exam but is indeterminate on 55/3. Cholecystectomy, without biliary ductal dilatation. Pancreas: Normal, without mass or ductal dilatation. Spleen: Splenomegaly at 13.9 cm craniocaudal. Old granulomatous disease within. Adrenals/Urinary Tract: Normal adrenal glands. Mild renal cortical thinning bilaterally. No hydronephrosis. Normal urinary  bladder. Stomach/Bowel: Proximal gastric underdistention. Extensive colonic diverticulosis. Sigmoid wall thickening is typical of muscular hypertrophy. Normal terminal ileum and appendix. Normal small bowel. Vascular/Lymphatic: Advanced aortic and branch vessel atherosclerosis. Splenic artery aneurysm of 1.2 cm is not significantly changed. Portal venous hypertension, with a recanalized paraumbilical vein and abdominal wall collaterals. No abdominopelvic adenopathy. Reproductive: Hysterectomy.  No adnexal mass. Other: Small volume abdominopelvic fluid, new. No free intraperitoneal air. Musculoskeletal: Osteopenia. Left iliac lytic lesion of 2.6 cm on 88/3, new. A mild superior endplate compression deformity at L3 is of indeterminate acuity. IMPRESSION: 1. Low sensitivity exam secondary to lack of oral or IV contrast and patient arm position, not raised above the head. 2. Osseous metastasis, most apparent as a lytic lesion in the left iliac wing. Suspicion of right acromion process metastasis as well. 3. Bilateral rib fractures, acute on the left and of indeterminate acuity on the right. 4. Thoracolumbar compression deformities, new since 02/27/2021,. Of indeterminate acuity. Cannot exclude pathologic fracture, including at T7. Consider nonemergent pre and postcontrast thoracic and possibly lumbar spine MRI. 5. Right lower and right middle lobe consolidation, favoring pneumonia. Concurrent peribronchovascular nodularity which is also most likely infectious. Somewhat atypical appearance of pulmonary metastasis or primary bronchogenic carcinoma felt less likely. Recommend follow-up on chest CT at 6-8 weeks. 6. Cirrhosis and portal venous hypertension with new small volume abdominopelvic ascites. Hypoattenuation in the central left lower lobe could represent an enlarged left portal vein but is indeterminate on this noncontrast exam. This could be further evaluated with nonemergent pre and post contrast abdominal MRI.  Alternatively, if the patient is eventually scheduled for PET, this would likely be informative. 7. Similar 12 mm splenic artery aneurysm. 8. Incidental findings, including: Coronary artery atherosclerosis. Aortic Atherosclerosis (ICD10-I70.0). Electronically Signed   By: Abigail Miyamoto M.D.   On: 03/14/2022 14:49   CT Head Wo Contrast  Result Date: 03/14/2022 CLINICAL DATA:  Status post fall EXAM: CT HEAD WITHOUT CONTRAST CT CERVICAL SPINE WITHOUT CONTRAST TECHNIQUE: Multidetector CT imaging of the head and cervical spine was performed following the standard protocol without intravenous contrast. Multiplanar CT image reconstructions of the cervical spine were also generated. RADIATION DOSE REDUCTION: This exam was performed according to the departmental dose-optimization program which includes automated exposure control, adjustment of the mA and/or kV according to patient size and/or use of iterative reconstruction technique. COMPARISON:  None Available. FINDINGS: CT HEAD FINDINGS Brain: Bilateral low-attenuation sub dural fluid collections overlie the cerebral image spheres compatible with chronic subdural hygromas. Overlying the right frontoparietal convexity there is a 4 mm intermediate density  subpleural fluid subdural fluid collection which measures Hounsfield units between 50 and 60, image 28/6 and image 26/5. No definite hyperdense subdural fluid collections identified. There is no significant midline shift or mass effect. No signs of acute brain infarct. There is mild diffuse low-attenuation within the subcortical and periventricular white matter compatible with chronic microvascular disease. Vascular: No hyperdense vessel or unexpected calcification. Skull: Normal. Negative for fracture or focal lesion. Sinuses/Orbits: No acute finding. Other: None CT CERVICAL SPINE FINDINGS Alignment: Normal. Skull base and vertebrae: There is an acute mildly displaced fracture involving the posterior spinous process T1,  image 71/5. Lucent lesion is identified within the posterior aspect of the T1 vertebral body measuring 1.4 cm, image 73/5. Soft tissues and spinal canal: No prevertebral fluid or swelling. No visible canal hematoma. Disc levels: Multilevel disc space narrowing and endplate spurring noted within the cervical spine. Upper chest: No pneumothorax identified within the lung apices. Permeative appearance of the right coracoid process of the right scapula, image 79/5. Other: None IMPRESSION: 1. Bilateral low-attenuation sub dural fluid collections overlie the cerebral image spheres compatible with chronic subdural hygromas. 2. Overlying the right frontoparietal convexity there is a 4 mm intermediate density subpleural fluid collection which measures Hounsfield units between 50 and 60. This is favored to represent a subacute subdural hematoma. 3. Acute mildly displaced fracture involving the posterior spinous process of T1. 4. Lucent lesion is identified within the posterior aspect of the T1 vertebral body measuring 1.4 cm. Permeative appearance of the right coracoid process of the right scapula. Findings are concerning for metastatic disease. 5. Chronic small vessel ischemic disease and brain atrophy. Critical Value/emergent results were called by telephone at the time of interpretation on 03/14/2022 at 2:43 pm to provider [facility] , who verbally acknowledged these results. Electronically Signed   By: Kerby Moors M.D.   On: 03/14/2022 14:44   CT Cervical Spine Wo Contrast  Result Date: 03/14/2022 CLINICAL DATA:  Status post fall EXAM: CT HEAD WITHOUT CONTRAST CT CERVICAL SPINE WITHOUT CONTRAST TECHNIQUE: Multidetector CT imaging of the head and cervical spine was performed following the standard protocol without intravenous contrast. Multiplanar CT image reconstructions of the cervical spine were also generated. RADIATION DOSE REDUCTION: This exam was performed according to the departmental dose-optimization  program which includes automated exposure control, adjustment of the mA and/or kV according to patient size and/or use of iterative reconstruction technique. COMPARISON:  None Available. FINDINGS: CT HEAD FINDINGS Brain: Bilateral low-attenuation sub dural fluid collections overlie the cerebral image spheres compatible with chronic subdural hygromas. Overlying the right frontoparietal convexity there is a 4 mm intermediate density subpleural fluid subdural fluid collection which measures Hounsfield units between 50 and 60, image 28/6 and image 26/5. No definite hyperdense subdural fluid collections identified. There is no significant midline shift or mass effect. No signs of acute brain infarct. There is mild diffuse low-attenuation within the subcortical and periventricular white matter compatible with chronic microvascular disease. Vascular: No hyperdense vessel or unexpected calcification. Skull: Normal. Negative for fracture or focal lesion. Sinuses/Orbits: No acute finding. Other: None CT CERVICAL SPINE FINDINGS Alignment: Normal. Skull base and vertebrae: There is an acute mildly displaced fracture involving the posterior spinous process T1, image 71/5. Lucent lesion is identified within the posterior aspect of the T1 vertebral body measuring 1.4 cm, image 73/5. Soft tissues and spinal canal: No prevertebral fluid or swelling. No visible canal hematoma. Disc levels: Multilevel disc space narrowing and endplate spurring noted within the cervical spine. Upper  chest: No pneumothorax identified within the lung apices. Permeative appearance of the right coracoid process of the right scapula, image 79/5. Other: None IMPRESSION: 1. Bilateral low-attenuation sub dural fluid collections overlie the cerebral image spheres compatible with chronic subdural hygromas. 2. Overlying the right frontoparietal convexity there is a 4 mm intermediate density subpleural fluid collection which measures Hounsfield units between 50  and 60. This is favored to represent a subacute subdural hematoma. 3. Acute mildly displaced fracture involving the posterior spinous process of T1. 4. Lucent lesion is identified within the posterior aspect of the T1 vertebral body measuring 1.4 cm. Permeative appearance of the right coracoid process of the right scapula. Findings are concerning for metastatic disease. 5. Chronic small vessel ischemic disease and brain atrophy. Critical Value/emergent results were called by telephone at the time of interpretation on 03/14/2022 at 2:43 pm to provider [facility] , who verbally acknowledged these results. Electronically Signed   By: Kerby Moors M.D.   On: 03/14/2022 14:44   DG Chest 2 View  Result Date: 03/14/2022 CLINICAL DATA:  Fall, rib pain, cough, and shortness of breath. EXAM: CHEST - 2 VIEW COMPARISON:  Chest radiographs 08/07/2009 and CT 02/28/2020 FINDINGS: The cardiomediastinal silhouette is unchanged with normal heart size. The lungs are hyperinflated. New patchy opacity is present in the medial right lung base. The left lung is clear. There could be trace pleural effusions. No pneumothorax is identified. There is a mildly displaced posterolateral right eighth rib fracture, and a non or minimally displaced seventh rib fracture is also suspected. Surgical clips are noted in the right axilla. There is a midthoracic vertebral compression fracture with moderate vertebral body height loss which is new from 2021 and of indeterminate age. IMPRESSION: 1. Right seventh and eighth rib fractures. No pneumothorax. 2. New medial right basilar airspace opacity suspicious for pneumonia. Followup PA and lateral chest radiographs are recommended in 3-4 weeks following trial of antibiotic therapy to ensure resolution and exclude underlying malignancy. 3. Possible trace pleural effusions. 4. Age-indeterminate midthoracic vertebral compression fracture. Electronically Signed   By: Logan Bores M.D.   On:  03/14/2022 12:02    Microbiology: Recent Results (from the past 240 hour(s))  Resp panel by RT-PCR (RSV, Flu A&B, Covid) Anterior Nasal Swab     Status: None   Collection Time: 04/09/22  8:37 AM   Specimen: Anterior Nasal Swab  Result Value Ref Range Status   SARS Coronavirus 2 by RT PCR NEGATIVE NEGATIVE Final    Comment: (NOTE) SARS-CoV-2 target nucleic acids are NOT DETECTED.  The SARS-CoV-2 RNA is generally detectable in upper respiratory specimens during the acute phase of infection. The lowest concentration of SARS-CoV-2 viral copies this assay can detect is 138 copies/mL. A negative result does not preclude SARS-Cov-2 infection and should not be used as the sole basis for treatment or other patient management decisions. A negative result may occur with  improper specimen collection/handling, submission of specimen other than nasopharyngeal swab, presence of viral mutation(s) within the areas targeted by this assay, and inadequate number of viral copies(<138 copies/mL). A negative result must be combined with clinical observations, patient history, and epidemiological information. The expected result is Negative.  Fact Sheet for Patients:  EntrepreneurPulse.com.au  Fact Sheet for Healthcare Providers:  IncredibleEmployment.be  This test is no t yet approved or cleared by the [place] FDA and  has been authorized for detection and/or diagnosis of SARS-CoV-2 by FDA under an Emergency Use Authorization (EUA). This EUA will remain  in effect (meaning this test can be used) for the duration of the COVID-19 declaration under Section 564(b)(1) of the Act, 21 U.S.C.section 360bbb-3(b)(1), unless the authorization is terminated  or revoked sooner.       Influenza A by PCR NEGATIVE NEGATIVE Final   Influenza B by PCR NEGATIVE NEGATIVE Final    Comment: (NOTE) The Xpert Xpress SARS-CoV-2/FLU/RSV plus assay is intended as an aid in the  diagnosis of influenza from Nasopharyngeal swab specimens and should not be used as a sole basis for treatment. Nasal washings and aspirates are unacceptable for Xpert Xpress SARS-CoV-2/FLU/RSV testing.  Fact Sheet for Patients: EntrepreneurPulse.com.au  Fact Sheet for Healthcare Providers: IncredibleEmployment.be  This test is not yet approved or cleared by the [place] FDA and has been authorized for detection and/or diagnosis of SARS-CoV-2 by FDA under an Emergency Use Authorization (EUA). This EUA will remain in effect (meaning this test can be used) for the duration of the COVID-19 declaration under Section 564(b)(1) of the Act, 21 U.S.C. section 360bbb-3(b)(1), unless the authorization is terminated or revoked.     Resp Syncytial Virus by PCR NEGATIVE NEGATIVE Final    Comment: (NOTE) Fact Sheet for Patients: EntrepreneurPulse.com.au  Fact Sheet for Healthcare Providers: IncredibleEmployment.be  This test is not yet approved or cleared by the [place] FDA and has been authorized for detection and/or diagnosis of SARS-CoV-2 by FDA under an Emergency Use Authorization (EUA). This EUA will remain in effect (meaning this test can be used) for the duration of the COVID-19 declaration under Section 564(b)(1) of the Act, 21 U.S.C. section 360bbb-3(b)(1), unless the authorization is terminated or revoked.  Performed at [facility], [place] [address], [place], [place] [address]      Labs: Basic Metabolic Panel: Recent Labs  Lab 04/09/22 0836  NA 140  K 3.7  CL 106  CO2 20*  GLUCOSE 141*  BUN 47*  CREATININE 2.39*  CALCIUM 8.6*  MG 2.1   Liver Function Tests: Recent Labs  Lab 04/09/22 0836  AST 147*  ALT 46*  ALKPHOS 191*  BILITOT 3.4*  PROT 6.7  ALBUMIN 3.4*   No results for input(s): "LIPASE", "AMYLASE" in the last 168 hours. Recent Labs  Lab  04/09/22 1051  AMMONIA 111*   CBC: Recent Labs  Lab 04/09/22 0836  WBC 4.0  NEUTROABS 3.2  HGB 8.6*  HCT 28.0*  MCV 109.8*  PLT 99*   Cardiac Enzymes: No results for input(s): "CKTOTAL", "CKMB", "CKMBINDEX", "TROPONINI" in the last 168 hours. BNP: BNP (last 3 results) Recent Labs    03/21/22 0405 04/09/22 0836  BNP 40.5 49.2    ProBNP (last 3 results) No results for input(s): "PROBNP" in the last 8760 hours.  CBG: Recent Labs  Lab 04/09/22 0917  GLUCAP 193*       Signed:  Irine Seal MD.  [facility] 04/10/2022, 2:25 PM

## 2022-04-10 NOTE — Progress Notes (Signed)
Daily Progress Note   Patient Name: Catherine Decker       Date: 04/10/2022 DOB: 07/24/1940  Age: 82 y.o. MRN#: [id number] Attending Physician: Eugenie Filler, MD Primary Care Physician: Antony Contras, MD Admit Date: 04/09/2022 Length of Stay: 1 day  Reason for Consultation/Follow-up: Establishing goals of care  Subjective:   CC: Patient is still agitated in mittens. Following up on complex medical decision making and symptom management.  Spoke with daughter at bedside.  Subjective:  Prior to seeing patient.  At time of EMR review in the past 24 hours patient has received IV Haldol 1 mg x 1 dose, IV Dilaudid 0.3 mg x 1 dose and IV Ativan 1 mg x 2 doses.  When presenting to bedside, patient's daughter, Coralyn Mark, is present.  Gust patient's care with her.  She noted patient really only minimally awakened this morning to say that she is in pain in her legs.  Patient remains essentially incoherent and agitated.  When seeing patient laying in bed, she remains in mittens and still appears agitated.  Discussed adjusting medications with daughter.  Noted would schedule Ativan to assist with anxiety management and allow patient to have mittens removed for comfort.  Also noted would provide patient with IV Dilaudid for pain management.  Discussed if patient is needing frequent IV Dilaudid, may need to consider continuous infusion for pain management.  Noted patient is still awaiting evaluation for inpatient hospice in [facility].  All questions answered at that time.  Offered emotional support as able.  Thanked daughter for allowing me to visit with her and patient today.  Daughter continues to express gratitude for patient's care.  After visit, informed care team regarding changes in medications for comfort.   Review of Systems Appears agitated in mittens  Objective:   Vital Signs:  BP (!) 109/41 (BP Location: Left Arm)   Pulse 77   Temp 98 F (36.7 C) (Oral)   Resp 17   Ht '5\' 3"'$  (1.6 m)    Wt 74 kg   SpO2 98%   BMI 28.90 kg/m   Physical Exam: General: awake, mumbling incoherently at times, in mittens Eyes: no discharge noted HENT: dry mucous membranes Cardiovascular:  tachycardia noted, edema in LE b/l Respiratory: no increased work of breathing noted Extremities: 4 extremities remain in mittens bilaterally Skin: Ecchymoses on upper extremities Neuro: Confused, mumbling, not following commands Psych: Agitated  Imaging:  I personally reviewed recent imaging.   Assessment & Plan:   Assessment: Patient is an 82 year old female with a past medical history of anemia, cirrhosis of the liver, diabetes mellitus, GERD, hepatitis, hyperlipidemia, hypertension, hypothyroidism, and recent diagnosis of metastatic carcinoma who was admitted on 04/09/2022 for management of encephalopathy from rehab.  Patient had recently been discharged from the hospital (04/03/22) after management of a fall with multiple rib fractures, subdural hematomas, influenza a, volume overload in setting of acute on chronic diastolic CHF, and AKI and was discharged to rehab.  Upon admission to the ER CT imaging of head did not show any acute processes.  Lab work has shown and worsening renal failure and liver decompensation.  Palliative medicine team consulted to assist with complex medical decision making.   Recommendations/Plan: # Complex medical decision making/goals of care:   -Patient unable to participate in medical decision making secondary to his mental status.                -Spoke with patient's NOK/daughter, [employer], at bedside. Patient was transition  to full comfort care on 04/09/2022.  Waiting inpatient hospice evaluation for possible placement in [facility].                - Code Status: DNR                 - Prognosis: days to short weeks (no oral intake, bed bound status, worsening renal function, severe agitation, liver failure, known metastatic cancer)   # Symptom management                   -Pain/Dyspnea, acute in the setting of end-of-life care                Patient was not on medications for pain previously.                               -Continue IV Dilaudid 0.3 mg IV q 30 mins hour as needed.  Continue to adjust based on patient's symptom burden.  If patient needing frequent dosing, may need to consider continuous infusion.                  -Anxiety/agitation, in the setting of end-of-life care                               -Scheduled IV Ativan 1 mg every 4 hours as needed. Has Ativan IV '1mg'$  q4hrs prn in addition to this. Continue to adjust based on patient's symptom burden.                                 -And also has IV Haldol 1 mg every 4 hours as needed. Continue to adjust based on patient's symptom burden.                   -Secretions, in the setting of end-of-life care                               -Continue IV glycopyrrolate 0.2 mg every 4 hours as needed.   # Psycho-social/Spiritual Support:  - Support System: Daughter, two sons - Desire for further Chaplain support: Appreciate chaplain's support  # Discharge Planning: Patient to be evaluated for potential inpatient hospice placement in [facility].  Discussed with: daughter, care team  Thank you for allowing the palliative care team to participate in the care Nicoletta Dress.  Chelsea Aus, DO Palliative Care Provider PMT # [phone]  If patient remains symptomatic despite maximum doses, please call PMT at [phone] between 0700 and 1900. Outside of these hours, please call attending, as PMT does not have night coverage.

## 2022-04-10 NOTE — Progress Notes (Signed)
EMS pick up time 19:30 Patient eyes closed, no respiratory distress. D/C with foley and peripheral IV access. Family members present at pick up time in room.

## 2022-04-10 NOTE — Progress Notes (Signed)
Chaplain engaged in an initial visit with Catherine Decker and her family.  While [place] slept, Chaplain was able to learn about her through her daughter.  Daughter shared that Catherine Decker endured a lot as a child and found happiness in living with her grandparents who raised her.  Catherine Decker was married and lost her husband about five years ago to cancer.  Catherine Decker also lost her sister and a best friend within the last couple of years.  Catherine Decker was connected to a [employer] and engaged in [place] based [place] with them. She found some solace in leaving with a friend who was like a brother to her and being closer to her daughter.  Chaplain acknowledged the grief Catherine Decker has endured.    Daughter was able to share about what recently brought Catherine Decker into the hospital and how her mother did not actively display that she had stage 4 cancer in her bones.  She voiced that Catherine Decker only talked about her hip hurting.  Daughter was able to decide on hospice as the next step because she does not want to see her mom continue to suffer.  She thought deeply about [name]'s quality of life. Chaplain affirmed her love and care for her mother.    Chaplain offered reflective listening, a compassionate presence, and prayer over [name].    Chaplain Lamoine Fredricksen, MDiv  04/10/22 1000  Spiritual Encounters  Type of Visit Initial  Care provided to: Pt and family  Referral source Family  Reason for visit End-of-life  Spiritual Framework  Presenting Themes Rituals and practive  Community/Connection Family;Faith community  Interventions  Spiritual Care Interventions Made Prayer;Reflective listening;Compassionate presence;Established relationship of care and support;Narrative/life review  Intervention Outcomes  Outcomes Connection to spiritual care;Awareness around self/spiritual resourses;Awareness of support

## 2022-04-10 NOTE — TOC Progression Note (Addendum)
Transition of Care [facility]) - Progression Note    Patient Details  Name: Catherine Decker MRN: [id number] Date of Birth: [birth year]  [facility] CM/SW [place], RN Phone [phone]  04/10/2022, 12:56 PM  Clinical Narrative:    CM has received notification that patient is ok to discharge to [facility]. Transportation has been arranged per [facility]. Discharge packet is at the nurses station. Daughter at bedside and made aware of discharge.        Expected Discharge Plan and Services                                               Social Determinants of Health (SDOH) Interventions SDOH Screenings   Food Insecurity: No Food Insecurity (04/09/2022)  Housing: Low Risk  (04/09/2022)  Transportation Needs: No Transportation Needs (04/09/2022)  Utilities: Not At Risk (04/09/2022)  Tobacco Use: Low Risk  (04/09/2022)    Readmission Risk Interventions     No data to display

## 2022-04-10 NOTE — Progress Notes (Signed)
We were notified of the patient's current status by the inpt team, and see that she is going to enroll in hospice care. I called and confirmed with her daughter Ms. Blackely and we will cancel remaining radiation treatments, and be available to the team or the family as needed.     Carola Rhine, PAC

## 2022-04-10 NOTE — Progress Notes (Signed)
  Radiation Oncology         [phone] ________________________________  Name: Catherine Decker MRN: [id number]  Date: 04/10/2022  DOB: 1940-03-24  End of Treatment Note  Diagnosis:   Recurrent Metastatic Breast cancer with disease in the bone.     Indication for treatment: palliative       Radiation treatment dates:   04/03/22-04/08/22  Site/planned dose:   The patient's prescription was for 30 Gy in 10 fractions to the left pelvis. She received 12 Gy in 4 fractions prior to discontinuing radiation.  Narrative: The patient's status declined during her course of radiation, and her radiotherapy was discontinued after 4 treatments due to liver failure from cirrhosis.   Plan: The patient will be enrolling in hospice care. We will be available to the team and the patient and her family as needed.     Carola Rhine, PAC

## 2022-04-11 ENCOUNTER — Ambulatory Visit: Payer: Medicare HMO

## 2022-04-11 ENCOUNTER — Inpatient Hospital Stay: Payer: Medicare HMO | Admitting: Hematology and Oncology

## 2022-04-14 ENCOUNTER — Ambulatory Visit: Payer: Medicare HMO

## 2022-04-15 ENCOUNTER — Ambulatory Visit: Payer: Medicare HMO

## 2022-04-16 ENCOUNTER — Ambulatory Visit: Payer: Medicare HMO

## 2022-04-17 ENCOUNTER — Ambulatory Visit: Payer: Medicare HMO

## 2022-05-09 DEATH — deceased

## 2022-08-01 ENCOUNTER — Other Ambulatory Visit: Payer: Medicare HMO

## 2022-08-01 ENCOUNTER — Ambulatory Visit: Payer: Medicare HMO | Admitting: Hematology and Oncology
